# Patient Record
Sex: Male | Born: 1950
Health system: Southern US, Community
[De-identification: ages and names within clinical notes are randomized; demographics above are authoritative.]

## PROBLEM LIST (undated history)

## (undated) DIAGNOSIS — C61 Malignant neoplasm of prostate: Secondary | ICD-10-CM

## (undated) DIAGNOSIS — E119 Type 2 diabetes mellitus without complications: Secondary | ICD-10-CM

## (undated) DIAGNOSIS — E785 Hyperlipidemia, unspecified: Secondary | ICD-10-CM

## (undated) DIAGNOSIS — N200 Calculus of kidney: Secondary | ICD-10-CM

## (undated) DIAGNOSIS — I10 Essential (primary) hypertension: Secondary | ICD-10-CM

## (undated) DIAGNOSIS — I350 Nonrheumatic aortic (valve) stenosis: Secondary | ICD-10-CM

## (undated) DIAGNOSIS — I509 Heart failure, unspecified: Secondary | ICD-10-CM

## (undated) DIAGNOSIS — R7303 Prediabetes: Secondary | ICD-10-CM

## (undated) DIAGNOSIS — Z87442 Personal history of urinary calculi: Secondary | ICD-10-CM

## (undated) DIAGNOSIS — Z9109 Other allergy status, other than to drugs and biological substances: Secondary | ICD-10-CM

## (undated) DIAGNOSIS — R569 Unspecified convulsions: Secondary | ICD-10-CM

## (undated) DIAGNOSIS — C801 Malignant (primary) neoplasm, unspecified: Secondary | ICD-10-CM

## (undated) DIAGNOSIS — I2510 Atherosclerotic heart disease of native coronary artery without angina pectoris: Secondary | ICD-10-CM

## (undated) DIAGNOSIS — J45909 Unspecified asthma, uncomplicated: Secondary | ICD-10-CM

## (undated) DIAGNOSIS — K635 Polyp of colon: Secondary | ICD-10-CM

## (undated) HISTORY — DX: Atherosclerotic heart disease of native coronary artery without angina pectoris: I25.10

## (undated) HISTORY — DX: Unspecified asthma, uncomplicated: J45.909

## (undated) HISTORY — DX: Other allergy status, other than to drugs and biological substances: Z91.09

## (undated) HISTORY — DX: Hyperlipidemia, unspecified: E78.5

## (undated) HISTORY — DX: Nonrheumatic aortic (valve) stenosis: I35.0

## (undated) HISTORY — PX: OTHER SURGICAL HISTORY: SHX169

## (undated) HISTORY — DX: Malignant (primary) neoplasm, unspecified: C80.1

## (undated) HISTORY — PX: EXTRACORPOREAL SHOCK WAVE LITHOTRIPSY: SHX1557

## (undated) HISTORY — DX: Polyp of colon: K63.5

## (undated) HISTORY — DX: Calculus of kidney: N20.0

---

## 1998-01-05 ENCOUNTER — Emergency Department (HOSPITAL_COMMUNITY): Admission: EM | Admit: 1998-01-05 | Discharge: 1998-01-05 | Payer: Self-pay | Admitting: Emergency Medicine

## 1998-01-16 ENCOUNTER — Ambulatory Visit (HOSPITAL_COMMUNITY): Admission: AD | Admit: 1998-01-16 | Discharge: 1998-01-16 | Payer: Self-pay | Admitting: *Deleted

## 1998-01-16 ENCOUNTER — Encounter: Payer: Self-pay | Admitting: *Deleted

## 1998-01-21 ENCOUNTER — Observation Stay (HOSPITAL_COMMUNITY): Admission: EM | Admit: 1998-01-21 | Discharge: 1998-01-22 | Payer: Self-pay

## 1998-09-01 ENCOUNTER — Encounter: Payer: Self-pay | Admitting: Emergency Medicine

## 1998-09-01 ENCOUNTER — Emergency Department (HOSPITAL_COMMUNITY): Admission: EM | Admit: 1998-09-01 | Discharge: 1998-09-01 | Payer: Self-pay | Admitting: Emergency Medicine

## 2002-05-17 HISTORY — PX: OTHER SURGICAL HISTORY: SHX169

## 2002-10-15 ENCOUNTER — Encounter: Payer: Self-pay | Admitting: Emergency Medicine

## 2002-10-15 ENCOUNTER — Inpatient Hospital Stay (HOSPITAL_COMMUNITY): Admission: EM | Admit: 2002-10-15 | Discharge: 2002-10-19 | Payer: Self-pay | Admitting: Emergency Medicine

## 2003-12-25 ENCOUNTER — Emergency Department (HOSPITAL_COMMUNITY): Admission: EM | Admit: 2003-12-25 | Discharge: 2003-12-25 | Payer: Self-pay | Admitting: Emergency Medicine

## 2005-09-15 ENCOUNTER — Encounter: Payer: Self-pay | Admitting: Emergency Medicine

## 2011-03-10 ENCOUNTER — Ambulatory Visit: Payer: Self-pay | Admitting: Radiation Oncology

## 2013-05-17 HISTORY — PX: EYE SURGERY: SHX253

## 2014-01-17 ENCOUNTER — Encounter: Payer: Self-pay | Admitting: Cardiovascular Disease

## 2014-01-29 ENCOUNTER — Encounter: Payer: Self-pay | Admitting: General Surgery

## 2014-01-29 ENCOUNTER — Ambulatory Visit (INDEPENDENT_AMBULATORY_CARE_PROVIDER_SITE_OTHER): Payer: BC Managed Care – PPO | Admitting: Cardiology

## 2014-01-29 ENCOUNTER — Encounter: Payer: Self-pay | Admitting: Cardiology

## 2014-01-29 VITALS — BP 132/90 | HR 81 | Ht 67.0 in | Wt 156.6 lb

## 2014-01-29 DIAGNOSIS — R0789 Other chest pain: Secondary | ICD-10-CM

## 2014-01-29 DIAGNOSIS — R011 Cardiac murmur, unspecified: Secondary | ICD-10-CM | POA: Insufficient documentation

## 2014-01-29 DIAGNOSIS — E785 Hyperlipidemia, unspecified: Secondary | ICD-10-CM

## 2014-01-29 DIAGNOSIS — R0602 Shortness of breath: Secondary | ICD-10-CM

## 2014-01-29 NOTE — Consult Note (Signed)
[place] [place], [place]  [address] Phone: [phone] Fax:  [phone]  Date:  01/29/2014   ID:  FAARIS ARIZPE, DOB [birth year], MRN [id number]  PCP:  Shirline Frees, MD  Cardiologist:  Fransico Him, MD     History of Present Illness: Melvin Dunn is a 63 y.o. male with a history of dyslipidemia, adenoCA of the prostate, asthma who presents today for evaluation of chest pressure and tightness and SOB.  These symptoms are different from what he gets with his asthma.  These symptoms have been going on for about 8 months but recently have increased in frequency and with less exertion.  He has not rest symptoms.  He describes it as SOB and then his chest feels tight.  There is no radiation of the discomfort.  He denies any diaphoresis or nausea with it.  He has also noticed exertional fatigue.  He denies any LE edema, palpitations or syncope.   Wt Readings from Last 3 Encounters:  01/29/14 156 lb 9.6 oz (71.033 kg)     Past Medical History  Diagnosis Date  . Environmental allergies   . Asthma   . Hyperlipidemia   . Colon polyp   . Adenocarcinoma     of the prostate, 5% of single core 01/2011-watching and waiting for now  . Kidney stone   . Prediabetes     Current Outpatient Prescriptions  Medication Sig Dispense Refill  . albuterol (PROVENTIL HFA;VENTOLIN HFA) 108 (90 BASE) MCG/ACT inhaler Inhale 1-2 puffs into the lungs every 4 (four) hours as needed for wheezing or shortness of breath.      Marland Kitchen atorvastatin (LIPITOR) 40 MG tablet 40 mg daily.      . fluticasone (FLONASE) 50 MCG/ACT nasal spray       . mometasone-formoterol (DULERA) 100-5 MCG/ACT AERO Inhale 2 puffs into the lungs 2 (two) times daily.      Marland Kitchen NITROSTAT 0.4 MG SL tablet        No current facility-administered medications for this visit.    Allergies:   No Known Allergies  Social History:  The patient  reports that he has never smoked. He does not have any smokeless tobacco history on file. He  reports that he does not drink alcohol or use illicit drugs.   Family History:  The patient's family history includes Alzheimer's disease in his mother; Cancer - Lung in his father.   ROS:  Please see the history of present illness.      All other systems reviewed and negative.   PHYSICAL EXAM: VS:  BP 132/90  Pulse 81  Ht 5\' 7"  (1.702 m)  Wt 156 lb 9.6 oz (71.033 kg)  BMI 24.52 kg/m2 Well nourished, well developed, in no acute distress HEENT: normal Neck: no JVD Cardiac:  normal S1, S2; RRR; 2/6 SM at LLSB to apex Lungs:  clear to auscultation bilaterally, no wheezing, rhonchi or rales Abd: soft, nontender, no hepatomegaly Ext: no edema Skin: warm and dry Neuro:  CNs 2-12 intact, no focal abnormalities noted  EKG:   NSR with LVH, no ST changes  ASSESSMENT AND PLAN:  1. Chest discomfort with very few CRF.   - Stress myoview to rule out ishemia 2.  DOE which may be secondary to asthma but need to rule out CAD.  With heart mumur, this could also be due to valvular heart disease 3.  Heart murmur - check 2D echo to assess  Followup PRN pending results of  studies   Signed, Fransico Him, MD 01/29/2014 4:13 PM

## 2014-01-29 NOTE — Patient Instructions (Signed)
Your physician recommends that you continue on your current medications as directed. Please refer to the Current Medication list given to you today.  Your physician has requested that you have an echocardiogram. Echocardiography is a painless test that uses sound waves to create images of your heart. It provides your doctor with information about the size and shape of your heart and how well your heart's chambers and valves are working. This procedure takes approximately one hour. There are no restrictions for this procedure.  Your physician has requested that you have an exercise stress myoview. For further information please visit [email or web address]. Please follow instruction sheet, as given.   Your physician recommends that you schedule a follow-up appointment Pending Results of studies

## 2014-01-30 NOTE — Progress Notes (Addendum)
[place], [address] [place], [place]  [address] Phone: [phone] Fax:  [phone]  Date:  01/29/2014   ID:  Melvin Dunn, DOB 06-23-1950, MRN [id number]  PCP:  Shirline Frees, MD  Cardiologist:  Fransico Him, MD    History of Present Illness: Melvin Dunn is a 63 y.o. male with a history of dyslipidemia, prediabetes and asthma who presents today for evaluation of SOB and chest pain.  He recently saw Dr. [name] and complained pressure in his chest when he would exert himself.  He has also noticed SOB with exertion that is different from his asthma.  He denies any nausea but occasionally will have some diaphoresis with the pain.  EKG shows NSR at baseline.     Wt Readings from Last 3 Encounters:  02/11/14 150 lb (68.04 kg)  01/29/14 156 lb 9.6 oz (71.033 kg)     Past Medical History  Diagnosis Date  . Environmental allergies   . Asthma   . Hyperlipidemia   . Colon polyp   . Adenocarcinoma     of the prostate, 5% of single core 01/2011-watching and waiting for now  . Kidney stone   . Prediabetes     Current Outpatient Prescriptions  Medication Sig Dispense Refill  . atorvastatin (LIPITOR) 40 MG tablet Take 40 mg by mouth at bedtime.       . fluticasone (FLONASE) 50 MCG/ACT nasal spray Place 2 sprays into both nostrils at bedtime.       Marland Kitchen albuterol (PROAIR HFA) 108 (90 BASE) MCG/ACT inhaler Inhale 1-2 puffs into the lungs daily as needed for wheezing or shortness of breath.      Marland Kitchen aspirin EC 325 MG tablet Take 1 tablet (325 mg total) by mouth daily.  1 tablet  0  . mometasone-formoterol (DULERA) 200-5 MCG/ACT AERO Inhale 2 puffs into the lungs at bedtime.      . Multiple Vitamin (MULTIVITAMIN WITH MINERALS) TABS tablet Take 1 tablet by mouth daily.      . nitroGLYCERIN (NITROSTAT) 0.4 MG SL tablet Place 0.4 mg under the tongue every 5 (five) minutes as needed for chest pain.       No current facility-administered medications for this visit.    Allergies:   No Known  Allergies  Social History:  The patient  reports that he has never smoked. He does not have any smokeless tobacco history on file. He reports that he does not drink alcohol or use illicit drugs.   Family History:  The patient's family history includes Alzheimer's disease in his mother; Cancer - Lung in his father.   ROS:  Please see the history of present illness.      All other systems reviewed and negative.   PHYSICAL EXAM: VS:  BP 132/90  Pulse 81  Ht 5\' 7"  (1.702 m)  Wt 156 lb 9.6 oz (71.033 kg)  BMI 24.52 kg/m2 Well nourished, well developed, in no acute distress HEENT: normal Neck: no JVD Cardiac:  normal S1, S2; RRR; no murmur Lungs:  clear to auscultation bilaterally, no wheezing, rhonchi or rales Abd: soft, nontender, no hepatomegaly Ext: no edema Skin: warm and dry Neuro:  CNs 2-12 intact, no focal abnormalities noted  EKG:     NSR with no ST/T wave abnormality  ASSESSMENT AND PLAN:  1. Chest pain with exertion associated with DOE worrisome for exertional angina.  His CRF include dyslipidemia and prediabetes. - will get a Stress myoview to rule out ischemia -  check 2D echo to evaluate LVF 2. Dyslipidemia 3. Prediabetes  Followup pending results of studies  Signed, Fransico Him, MD [facility] 01/29/2014

## 2014-02-11 ENCOUNTER — Other Ambulatory Visit: Payer: Self-pay | Admitting: Cardiology

## 2014-02-11 ENCOUNTER — Other Ambulatory Visit: Payer: Self-pay

## 2014-02-11 ENCOUNTER — Ambulatory Visit (HOSPITAL_COMMUNITY): Payer: BC Managed Care – PPO | Attending: Internal Medicine | Admitting: Radiology

## 2014-02-11 ENCOUNTER — Encounter (HOSPITAL_COMMUNITY): Payer: Self-pay | Admitting: Pharmacy Technician

## 2014-02-11 ENCOUNTER — Other Ambulatory Visit (INDEPENDENT_AMBULATORY_CARE_PROVIDER_SITE_OTHER): Payer: BC Managed Care – PPO | Admitting: *Deleted

## 2014-02-11 ENCOUNTER — Ambulatory Visit (HOSPITAL_BASED_OUTPATIENT_CLINIC_OR_DEPARTMENT_OTHER): Payer: BC Managed Care – PPO | Admitting: Radiology

## 2014-02-11 VITALS — BP 135/71 | Ht 67.0 in | Wt 150.0 lb

## 2014-02-11 DIAGNOSIS — E785 Hyperlipidemia, unspecified: Secondary | ICD-10-CM | POA: Insufficient documentation

## 2014-02-11 DIAGNOSIS — R011 Cardiac murmur, unspecified: Secondary | ICD-10-CM | POA: Diagnosis present

## 2014-02-11 DIAGNOSIS — J45909 Unspecified asthma, uncomplicated: Secondary | ICD-10-CM | POA: Insufficient documentation

## 2014-02-11 DIAGNOSIS — R9430 Abnormal result of cardiovascular function study, unspecified: Secondary | ICD-10-CM

## 2014-02-11 DIAGNOSIS — R0602 Shortness of breath: Secondary | ICD-10-CM | POA: Insufficient documentation

## 2014-02-11 DIAGNOSIS — I350 Nonrheumatic aortic (valve) stenosis: Secondary | ICD-10-CM

## 2014-02-11 DIAGNOSIS — R079 Chest pain, unspecified: Secondary | ICD-10-CM | POA: Insufficient documentation

## 2014-02-11 DIAGNOSIS — R0789 Other chest pain: Secondary | ICD-10-CM

## 2014-02-11 DIAGNOSIS — R9439 Abnormal result of other cardiovascular function study: Secondary | ICD-10-CM | POA: Diagnosis not present

## 2014-02-11 HISTORY — DX: Nonrheumatic aortic (valve) stenosis: I35.0

## 2014-02-11 LAB — CBC
HCT: 43.6 % (ref 39.0–52.0)
HEMOGLOBIN: 14.6 g/dL (ref 13.0–17.0)
MCHC: 33.5 g/dL (ref 30.0–36.0)
MCV: 86.1 fl (ref 78.0–100.0)
PLATELETS: 266 10*3/uL (ref 150.0–400.0)
RBC: 5.06 Mil/uL (ref 4.22–5.81)
RDW: 13.9 % (ref 11.5–15.5)
WBC: 10.4 10*3/uL (ref 4.0–10.5)

## 2014-02-11 LAB — BASIC METABOLIC PANEL
BUN: 13 mg/dL (ref 6–23)
CO2: 30 meq/L (ref 19–32)
Calcium: 9.2 mg/dL (ref 8.4–10.5)
Chloride: 100 mEq/L (ref 96–112)
Creatinine, Ser: 1 mg/dL (ref 0.4–1.5)
GFR: 79.11 mL/min (ref >60.00)
GLUCOSE: 107 mg/dL — AB (ref 70–99)
Potassium: 3.8 mEq/L (ref 3.5–5.1)
SODIUM: 135 meq/L (ref 135–145)

## 2014-02-11 LAB — PROTIME-INR
INR: 1.1 ratio — ABNORMAL HIGH (ref 0.8–1.0)
Prothrombin Time: 12.2 s (ref 9.6–13.1)

## 2014-02-11 MED ORDER — TECHNETIUM TC 99M SESTAMIBI GENERIC - CARDIOLITE
10.0000 | Freq: Once | INTRAVENOUS | Status: AC | PRN
  Administered 2014-02-11: 10 via INTRAVENOUS

## 2014-02-11 MED ORDER — TECHNETIUM TC 99M SESTAMIBI GENERIC - CARDIOLITE
30.0000 | Freq: Once | INTRAVENOUS | Status: AC | PRN
  Administered 2014-02-11: 30 via INTRAVENOUS

## 2014-02-11 MED ORDER — ASPIRIN EC 325 MG PO TBEC: 325.0000 mg | DELAYED_RELEASE_TABLET | Freq: Every day | ORAL | Status: DC

## 2014-02-11 NOTE — Addendum Note (Signed)
Addended by: Sueanne Margarita on: 02/11/2014 09:57 PM   Modules accepted: Level of Service

## 2014-02-11 NOTE — Progress Notes (Signed)
[facility]UCLE[facility] [place], [place] [address] [phone]    Cardiology Nuclear Med Study  Melvin Dunn is a 63 y.o. male     MRN : [id number]     DOB: 08/09/1950  Procedure Date: 02/11/2014  Nuclear Med Background Indication for Stress Test:  Evaluation for Ischemia History:  ASTHMA Cardiac Risk Factors: Lipids  Symptoms:  Lipids   Nuclear Pre-Procedure Caffeine/Decaff Intake:  None> 12 hrs NPO After: 7:00pm   Lungs:  clear O2 Sat: 96/97% on room air. IV 0.9% NS with Angio Cath:  22g  IV Site: R Antecubital x 1, tolerated well IV Started by:  Irven Baltimore, RN  Chest Size (in):  42 Cup Size: n/a  Height: 5\' 7"  (1.702 m)  Weight:  150 lb (68.04 kg)  BMI:  Body mass index is 23.49 kg/(m^2). Tech Comments:  This patient had chest pain, sob,he became very pale/gray, had blunted BP's and his BP dropped as soon as he went into recovery. S.Williams EMTP    Nuclear Med Study 1 or 2 day study: 1 day  Stress Test Type:  Stress  Reading MD: N/A  Order Authorizing Provider:  Fransico Him, MD  Resting Radionuclide: Technetium 34m Sestamibi  Resting Radionuclide Dose: 11.0 mCi   Stress Radionuclide:  Technetium 40m Sestamibi  Stress Radionuclide Dose: 33.0 mCi           Stress Protocol Rest HR: 65 Stress HR: 137  Rest BP: 135/71 Stress BP: 166/78  Exercise Time (min): 6:50 METS: 7.60   Predicted Max HR: 157 bpm % Max HR: 87.26 bpm Rate Pressure Product: 22742   Dose of Adenosine (mg):  n/a Dose of Lexiscan: n/a mg  Dose of Atropine (mg): n/a Dose of Dobutamine: n/a mcg/kg/min (at max HR)  Stress Test Technologist: Perrin Maltese, EMT-P  Nuclear Technologist:  Earl Many, CNMT     Rest Procedure:  Myocardial perfusion imaging was performed at rest 45 minutes following the intravenous administration of Technetium 59m Sestamibi. Rest ECG: NSR - Normal EKG  Stress Procedure:  The patient exercised on the treadmill utilizing the Bruce  Protocol for 6:50 minutes. The patient stopped due to sob, fatigue, he was very pale, and chest pain.  Technetium 38m Sestamibi was injected at peak exercise and myocardial perfusion imaging was performed after a brief delay. Stress ECG: Significant ST abnormalities consistent with ischemia.  QPS Raw Data Images:  There is interference from nuclear activity from structures below the diaphragm. This does not affect the ability to read the study. Stress Images:  There is decreased uptake in the inferior wall. Rest Images:  Normal homogeneous uptake in all areas of the myocardium. Subtraction (SDS):  These findings are consistent with ischemia. Transient Ischemic Dilatation (Normal <1.22):  1.24 Lung/Heart Ratio (Normal <0.45):  0.35  Quantitative Gated Spect Images QGS EDV:  94 ml QGS ESV:  46 ml  Impression Exercise Capacity:  Good exercise capacity. BP Response:  Normal blood pressure response. Clinical Symptoms:  Significant chest pain. ECG Impression:  Significant ST abnormalities consistent with ischemia. Comparison with Prior Nuclear Study: No previous nuclear study performed  Overall Impression:  High risk stress nuclear study.   With stress there is a medium size moderate intensity reversible inferoapical defect. There are marked ST segment depression up to 5 mm in inferolateral leads with stress.  LV Ejection Fraction: 51%.  LV Wall Motion:  NL LV Function; NL Wall Motion  Darlin Coco MD

## 2014-02-11 NOTE — Progress Notes (Signed)
Echocardiogram performed.  

## 2014-02-11 NOTE — H&P (Signed)
[place], [address]  [place], [place] [address]  Phone: [phone]  Fax: [phone]   Date: 01/29/2014   ID: Melvin Dunn, DOB 1950/11/06, MRN [id number]   PCP: Shirline Frees, MD  Cardiologist: Fransico Him, MD   History of Present Illness:  Melvin Dunn is a 63 y.o. male with a history of dyslipidemia, prediabetes and asthma who presents today for evaluation of SOB and chest pain. He recently saw Dr. [name] and complained pressure in his chest when he would exert himself. He has also noticed SOB with exertion that is different from his asthma. He denies any nausea but occasionally will have some diaphoresis with the pain. EKG shows NSR at baseline.  Wt Readings from Last 3 Encounters:   02/11/14  150 lb (68.04 kg)   01/29/14  156 lb 9.6 oz (71.033 kg)    Past Medical History   Diagnosis  Date   .  Environmental allergies    .  Asthma    .  Hyperlipidemia    .  Colon polyp    .  Adenocarcinoma      of the prostate, 5% of single core 01/2011-watching and waiting for now   .  Kidney stone    .  Prediabetes     Current Outpatient Prescriptions   Medication  Sig  Dispense  Refill   .  atorvastatin (LIPITOR) 40 MG tablet  Take 40 mg by mouth at bedtime.     .  fluticasone (FLONASE) 50 MCG/ACT nasal spray  Place 2 sprays into both nostrils at bedtime.     Marland Kitchen  albuterol (PROAIR HFA) 108 (90 BASE) MCG/ACT inhaler  Inhale 1-2 puffs into the lungs daily as needed for wheezing or shortness of breath.     Marland Kitchen  aspirin EC 325 MG tablet  Take 1 tablet (325 mg total) by mouth daily.  1 tablet  0   .  mometasone-formoterol (DULERA) 200-5 MCG/ACT AERO  Inhale 2 puffs into the lungs at bedtime.     .  Multiple Vitamin (MULTIVITAMIN WITH MINERALS) TABS tablet  Take 1 tablet by mouth daily.     .  nitroGLYCERIN (NITROSTAT) 0.4 MG SL tablet  Place 0.4 mg under the tongue every 5 (five) minutes as needed for chest pain.      No current facility-administered medications for this visit.     Allergies: No Known Allergies  Social History: The patient reports that he has never smoked. He does not have any smokeless tobacco history on file. He reports that he does not drink alcohol or use illicit drugs.  Family History: The patient's family history includes Alzheimer's disease in his mother; Cancer - Lung in his father.  ROS: Please see the history of present illness. All other systems reviewed and negative.  PHYSICAL EXAM:  VS: BP 132/90  Pulse 81  Ht 5\' 7"  (1.702 m)  Wt 156 lb 9.6 oz (71.033 kg)  BMI 24.52 kg/m2  Well nourished, well developed, in no acute distress  HEENT: normal  Neck: no JVD  Cardiac: normal S1, S2; RRR; no murmur  Lungs: clear to auscultation bilaterally, no wheezing, rhonchi or rales  Abd: soft, nontender, no hepatomegaly  Ext: no edema  Skin: warm and dry  Neuro: CNs 2-12 intact, no focal abnormalities noted  EKG: NSR with no ST/T wave abnormality  ASSESSMENT AND PLAN:  1. Chest pain with exertion associated with DOE worrisome for exertional angina. His CRF include  dyslipidemia and prediabetes. - will get a Stress myoview to rule out ischemia  - check 2D echo to evaluate LVF  2. Dyslipidemia 3. Prediabetes  Followup pending results of studies   Signed,  Fransico Him, MD  [facility]  01/29/2014

## 2014-02-11 NOTE — H&P (Signed)
[place], [address]  [place], [place] [address]  Phone: [phone]  Fax: [phone]  Date: 01/29/2014  ID: Melvin Dunn, DOB 1951-04-09, MRN [id number]  PCP: Shirline Frees, MD  Cardiologist: Fransico Him, MD  History of Present Illness:  Melvin Dunn is a 63 y.o. male with a history of dyslipidemia, prediabetes and asthma who presents today for evaluation of SOB and chest pain. He recently saw Dr. [name] and complained pressure in his chest when he would exert himself. He has also noticed SOB with exertion that is different from his asthma. He denies any nausea but occasionally will have some diaphoresis with the pain. EKG shows NSR at baseline.  Wt Readings from Last 3 Encounters:   02/11/14  150 lb (68.04 kg)   01/29/14  156 lb 9.6 oz (71.033 kg)    Past Medical History   Diagnosis  Date   .  Environmental allergies    .  Asthma    .  Hyperlipidemia    .  Colon polyp    .  Adenocarcinoma      of the prostate, 5% of single core 01/2011-watching and waiting for now   .  Kidney stone    .  Prediabetes     Current Outpatient Prescriptions   Medication  Sig  Dispense  Refill   .  atorvastatin (LIPITOR) 40 MG tablet  Take 40 mg by mouth at bedtime.     .  fluticasone (FLONASE) 50 MCG/ACT nasal spray  Place 2 sprays into both nostrils at bedtime.     Marland Kitchen  albuterol (PROAIR HFA) 108 (90 BASE) MCG/ACT inhaler  Inhale 1-2 puffs into the lungs daily as needed for wheezing or shortness of breath.     Marland Kitchen  aspirin EC 325 MG tablet  Take 1 tablet (325 mg total) by mouth daily.  1 tablet  0   .  mometasone-formoterol (DULERA) 200-5 MCG/ACT AERO  Inhale 2 puffs into the lungs at bedtime.     .  Multiple Vitamin (MULTIVITAMIN WITH MINERALS) TABS tablet  Take 1 tablet by mouth daily.     .  nitroGLYCERIN (NITROSTAT) 0.4 MG SL tablet  Place 0.4 mg under the tongue every 5 (five) minutes as needed for chest pain.      No current facility-administered medications for this visit.    Allergies:  No Known Allergies  Social History: The patient reports that he has never smoked. He does not have any smokeless tobacco history on file. He reports that he does not drink alcohol or use illicit drugs.  Family History: The patient's family history includes Alzheimer's disease in his mother; Cancer - Lung in his father.  ROS: Please see the history of present illness. All other systems reviewed and negative.  PHYSICAL EXAM:  VS: BP 132/90  Pulse 81  Ht 5\' 7"  (1.702 m)  Wt 156 lb 9.6 oz (71.033 kg)  BMI 24.52 kg/m2  Well nourished, well developed, in no acute distress  HEENT: normal  Neck: no JVD  Cardiac: normal S1, S2; RRR; no murmur  Lungs: clear to auscultation bilaterally, no wheezing, rhonchi or rales  Abd: soft, nontender, no hepatomegaly  Ext: no edema  Skin: warm and dry  Neuro: CNs 2-12 intact, no focal abnormalities noted  EKG: NSR with no ST/T wave abnormality  ASSESSMENT AND PLAN:  1. Chest pain with exertion associated with DOE worrisome for exertional angina. His CRF include dyslipidemia and prediabetes. - will get  a Stress myoview to rule out ischemia  - check 2D echo to evaluate LVF  2. Dyslipidemia 3. Prediabetes Followup pending results of studies  Signed,  Fransico Him, MD  [facility]  01/29/2014

## 2014-02-12 ENCOUNTER — Encounter (HOSPITAL_COMMUNITY): Payer: Self-pay | Admitting: *Deleted

## 2014-02-12 ENCOUNTER — Encounter (HOSPITAL_COMMUNITY)
Admission: RE | Disposition: A | Discharge status: Home / Self Care | Payer: Self-pay | Source: Ambulatory Visit | Attending: Thoracic Surgery (Cardiothoracic Vascular Surgery)

## 2014-02-12 ENCOUNTER — Ambulatory Visit (HOSPITAL_COMMUNITY): Payer: BC Managed Care – PPO

## 2014-02-12 ENCOUNTER — Other Ambulatory Visit: Payer: Self-pay

## 2014-02-12 ENCOUNTER — Inpatient Hospital Stay (HOSPITAL_COMMUNITY)
Admission: RE | Admit: 2014-02-12 | Discharge: 2014-02-18 | DRG: 234 | Disposition: A | Discharge status: Home / Self Care | Payer: BC Managed Care – PPO | Source: Ambulatory Visit | Attending: Thoracic Surgery (Cardiothoracic Vascular Surgery) | Admitting: Thoracic Surgery (Cardiothoracic Vascular Surgery)

## 2014-02-12 DIAGNOSIS — I25118 Atherosclerotic heart disease of native coronary artery with other forms of angina pectoris: Secondary | ICD-10-CM | POA: Diagnosis not present

## 2014-02-12 DIAGNOSIS — Z01810 Encounter for preprocedural cardiovascular examination: Secondary | ICD-10-CM

## 2014-02-12 DIAGNOSIS — I359 Nonrheumatic aortic valve disorder, unspecified: Secondary | ICD-10-CM

## 2014-02-12 DIAGNOSIS — Z951 Presence of aortocoronary bypass graft: Secondary | ICD-10-CM

## 2014-02-12 DIAGNOSIS — Z801 Family history of malignant neoplasm of trachea, bronchus and lung: Secondary | ICD-10-CM | POA: Diagnosis not present

## 2014-02-12 DIAGNOSIS — E119 Type 2 diabetes mellitus without complications: Secondary | ICD-10-CM

## 2014-02-12 DIAGNOSIS — I25119 Atherosclerotic heart disease of native coronary artery with unspecified angina pectoris: Secondary | ICD-10-CM | POA: Diagnosis present

## 2014-02-12 DIAGNOSIS — I209 Angina pectoris, unspecified: Secondary | ICD-10-CM | POA: Diagnosis present

## 2014-02-12 DIAGNOSIS — Z7982 Long term (current) use of aspirin: Secondary | ICD-10-CM

## 2014-02-12 DIAGNOSIS — I2510 Atherosclerotic heart disease of native coronary artery without angina pectoris: Secondary | ICD-10-CM

## 2014-02-12 DIAGNOSIS — R6510 Systemic inflammatory response syndrome (SIRS) of non-infectious origin without acute organ dysfunction: Secondary | ICD-10-CM | POA: Diagnosis not present

## 2014-02-12 DIAGNOSIS — D62 Acute posthemorrhagic anemia: Secondary | ICD-10-CM | POA: Diagnosis not present

## 2014-02-12 DIAGNOSIS — J45909 Unspecified asthma, uncomplicated: Secondary | ICD-10-CM | POA: Diagnosis present

## 2014-02-12 DIAGNOSIS — E8770 Fluid overload, unspecified: Secondary | ICD-10-CM | POA: Diagnosis not present

## 2014-02-12 DIAGNOSIS — I350 Nonrheumatic aortic (valve) stenosis: Secondary | ICD-10-CM | POA: Diagnosis present

## 2014-02-12 DIAGNOSIS — R079 Chest pain, unspecified: Secondary | ICD-10-CM | POA: Diagnosis present

## 2014-02-12 DIAGNOSIS — E785 Hyperlipidemia, unspecified: Secondary | ICD-10-CM | POA: Diagnosis present

## 2014-02-12 DIAGNOSIS — Z8546 Personal history of malignant neoplasm of prostate: Secondary | ICD-10-CM

## 2014-02-12 HISTORY — DX: Nonrheumatic aortic (valve) stenosis: I35.0

## 2014-02-12 HISTORY — DX: Type 2 diabetes mellitus without complications: E11.9

## 2014-02-12 HISTORY — PX: LEFT HEART CATHETERIZATION WITH CORONARY ANGIOGRAM: SHX5451

## 2014-02-12 LAB — COMPREHENSIVE METABOLIC PANEL
ALT: 31 U/L (ref 0–53)
AST: 25 U/L (ref 0–37)
Albumin: 3.8 g/dL (ref 3.5–5.2)
Alkaline Phosphatase: 70 U/L (ref 39–117)
Anion gap: 14 (ref 5–15)
BILIRUBIN TOTAL: 0.6 mg/dL (ref 0.3–1.2)
BUN: 16 mg/dL (ref 6–23)
CHLORIDE: 100 meq/L (ref 96–112)
CO2: 26 meq/L (ref 19–32)
Calcium: 9.3 mg/dL (ref 8.4–10.5)
Creatinine, Ser: 0.88 mg/dL (ref 0.50–1.35)
GFR calc non Af Amer: 90 mL/min — ABNORMAL LOW (ref >90)
Glucose, Bld: 111 mg/dL — ABNORMAL HIGH (ref 70–99)
POTASSIUM: 3.8 meq/L (ref 3.7–5.3)
Sodium: 140 mEq/L (ref 137–147)
Total Protein: 7.3 g/dL (ref 6.0–8.3)

## 2014-02-12 LAB — APTT: aPTT: 32 seconds (ref 24–37)

## 2014-02-12 LAB — BLOOD GAS, ARTERIAL
Acid-base deficit: 0.7 mmol/L (ref 0.0–2.0)
Bicarbonate: 23.2 mEq/L (ref 20.0–24.0)
DRAWN BY: 23588
O2 Saturation: 95.4 %
PCO2 ART: 36.5 mmHg (ref 35.0–45.0)
PH ART: 7.419 (ref 7.350–7.450)
PO2 ART: 79.2 mmHg — AB (ref 80.0–100.0)
Patient temperature: 98.6
TCO2: 24.3 mmol/L (ref 0–100)

## 2014-02-12 LAB — INFLUENZA PANEL BY PCR (TYPE A & B)
H1N1 flu by pcr: NOT DETECTED
INFLAPCR: NEGATIVE
INFLBPCR: NEGATIVE

## 2014-02-12 LAB — PROTIME-INR
INR: 1.03 (ref 0.00–1.49)
Prothrombin Time: 13.5 seconds (ref 11.6–15.2)

## 2014-02-12 LAB — ABO/RH: ABO/RH(D): O POS

## 2014-02-12 LAB — MRSA PCR SCREENING: MRSA BY PCR: NEGATIVE

## 2014-02-12 LAB — TYPE AND SCREEN
ABO/RH(D): O POS
Antibody Screen: NEGATIVE

## 2014-02-12 SURGERY — LEFT HEART CATHETERIZATION WITH CORONARY ANGIOGRAM
Anesthesia: LOCAL

## 2014-02-12 MED ORDER — HEPARIN (PORCINE) IN NACL 100-0.45 UNIT/ML-% IJ SOLN
800.0000 [IU]/h | INTRAMUSCULAR | Status: DC
  Administered 2014-02-12: 800 [IU]/h via INTRAVENOUS
  Filled 2014-02-12: qty 250

## 2014-02-12 MED ORDER — MIDAZOLAM HCL 2 MG/2ML IJ SOLN
INTRAMUSCULAR | Status: AC
  Filled 2014-02-12: qty 2

## 2014-02-12 MED ORDER — PLASMA-LYTE 148 IV SOLN
INTRAVENOUS | Status: AC
  Administered 2014-02-13: 10:00:00
  Filled 2014-02-12: qty 2.5

## 2014-02-12 MED ORDER — FLUTICASONE PROPIONATE 50 MCG/ACT NA SUSP
2.0000 | Freq: Every day | NASAL | Status: DC
  Administered 2014-02-12: 2 via NASAL
  Filled 2014-02-12: qty 16

## 2014-02-12 MED ORDER — SODIUM CHLORIDE 0.9 % IV SOLN
INTRAVENOUS | Status: AC
  Administered 2014-02-13: 1 [IU]/h via INTRAVENOUS
  Filled 2014-02-12: qty 2.5

## 2014-02-12 MED ORDER — SODIUM CHLORIDE 0.9 % IV SOLN: 250.0000 mL | INTRAVENOUS | Status: DC | PRN

## 2014-02-12 MED ORDER — ACETAMINOPHEN 325 MG PO TABS: 650.0000 mg | ORAL_TABLET | ORAL | Status: DC | PRN

## 2014-02-12 MED ORDER — HEPARIN SODIUM (PORCINE) 1000 UNIT/ML IJ SOLN
INTRAMUSCULAR | Status: AC
  Filled 2014-02-12: qty 1

## 2014-02-12 MED ORDER — DEXTROSE 5 % IV SOLN
750.0000 mg | INTRAVENOUS | Status: DC
  Filled 2014-02-12: qty 750

## 2014-02-12 MED ORDER — ASPIRIN EC 325 MG PO TBEC
325.0000 mg | DELAYED_RELEASE_TABLET | Freq: Every day | ORAL | Status: DC
  Filled 2014-02-12: qty 1

## 2014-02-12 MED ORDER — ONDANSETRON HCL 4 MG/2ML IJ SOLN: 4.0000 mg | Freq: Four times a day (QID) | INTRAMUSCULAR | Status: DC | PRN

## 2014-02-12 MED ORDER — SODIUM CHLORIDE 0.9 % IV SOLN
INTRAVENOUS | Status: DC
  Administered 2014-02-12: 15:00:00 via INTRAVENOUS

## 2014-02-12 MED ORDER — ALBUTEROL SULFATE (2.5 MG/3ML) 0.083% IN NEBU: 2.5000 mg | INHALATION_SOLUTION | Freq: Every day | RESPIRATORY_TRACT | Status: DC | PRN

## 2014-02-12 MED ORDER — SODIUM CHLORIDE 0.9 % IJ SOLN: 3.0000 mL | INTRAMUSCULAR | Status: DC | PRN

## 2014-02-12 MED ORDER — CHLORHEXIDINE GLUCONATE 4 % EX LIQD
60.0000 mL | Freq: Once | CUTANEOUS | Status: AC
  Administered 2014-02-12: 4 via TOPICAL
  Filled 2014-02-12: qty 60

## 2014-02-12 MED ORDER — SODIUM CHLORIDE 0.9 % IV SOLN
INTRAVENOUS | Status: DC
  Filled 2014-02-12: qty 30

## 2014-02-12 MED ORDER — BISACODYL 5 MG PO TBEC: 5.0000 mg | DELAYED_RELEASE_TABLET | Freq: Once | ORAL | Status: DC

## 2014-02-12 MED ORDER — METOPROLOL TARTRATE 12.5 MG HALF TABLET
12.5000 mg | ORAL_TABLET | Freq: Two times a day (BID) | ORAL | Status: DC
  Administered 2014-02-12: 12.5 mg via ORAL
  Filled 2014-02-12 (×3): qty 1

## 2014-02-12 MED ORDER — HEPARIN (PORCINE) IN NACL 2-0.9 UNIT/ML-% IJ SOLN
INTRAMUSCULAR | Status: AC
  Filled 2014-02-12: qty 1000

## 2014-02-12 MED ORDER — NITROGLYCERIN 1 MG/10 ML FOR IR/CATH LAB
INTRA_ARTERIAL | Status: AC
  Filled 2014-02-12: qty 10

## 2014-02-12 MED ORDER — OXYCODONE-ACETAMINOPHEN 5-325 MG PO TABS
1.0000 | ORAL_TABLET | ORAL | Status: DC | PRN
  Filled 2014-02-12 (×2): qty 2

## 2014-02-12 MED ORDER — TEMAZEPAM 15 MG PO CAPS
15.0000 mg | ORAL_CAPSULE | Freq: Once | ORAL | Status: AC | PRN
  Administered 2014-02-12: 15 mg via ORAL
  Filled 2014-02-12: qty 1

## 2014-02-12 MED ORDER — NITROGLYCERIN IN D5W 200-5 MCG/ML-% IV SOLN
2.0000 ug/min | INTRAVENOUS | Status: AC
  Administered 2014-02-13: 5 ug/min via INTRAVENOUS
  Filled 2014-02-12: qty 250

## 2014-02-12 MED ORDER — POTASSIUM CHLORIDE 2 MEQ/ML IV SOLN
80.0000 meq | INTRAVENOUS | Status: DC
  Filled 2014-02-12: qty 40

## 2014-02-12 MED ORDER — HEPARIN (PORCINE) IN NACL 2-0.9 UNIT/ML-% IJ SOLN
INTRAMUSCULAR | Status: AC
  Filled 2014-02-12: qty 500

## 2014-02-12 MED ORDER — PHENYLEPHRINE HCL 10 MG/ML IJ SOLN
30.0000 ug/min | INTRAVENOUS | Status: AC
  Administered 2014-02-13: 20 ug/min via INTRAVENOUS
  Filled 2014-02-12: qty 2

## 2014-02-12 MED ORDER — CHLORHEXIDINE GLUCONATE 4 % EX LIQD
60.0000 mL | Freq: Once | CUTANEOUS | Status: AC
Start: 2014-02-13 — End: 2014-02-13
  Administered 2014-02-13: 4 via TOPICAL
  Filled 2014-02-12: qty 60

## 2014-02-12 MED ORDER — FENTANYL CITRATE 0.05 MG/ML IJ SOLN
INTRAMUSCULAR | Status: AC
  Filled 2014-02-12: qty 2

## 2014-02-12 MED ORDER — VERAPAMIL HCL 2.5 MG/ML IV SOLN
INTRAVENOUS | Status: AC
  Filled 2014-02-12: qty 2

## 2014-02-12 MED ORDER — DEXTROSE 5 % IV SOLN
1.5000 g | INTRAVENOUS | Status: AC
  Administered 2014-02-13: .75 g via INTRAVENOUS
  Administered 2014-02-13: 1.5 g via INTRAVENOUS
  Filled 2014-02-12: qty 1.5

## 2014-02-12 MED ORDER — METOPROLOL TARTRATE 12.5 MG HALF TABLET
12.5000 mg | ORAL_TABLET | Freq: Once | ORAL | Status: AC
  Administered 2014-02-13: 12.5 mg via ORAL
  Filled 2014-02-12: qty 1

## 2014-02-12 MED ORDER — SODIUM CHLORIDE 0.9 % IJ SOLN: 3.0000 mL | Freq: Two times a day (BID) | INTRAMUSCULAR | Status: DC

## 2014-02-12 MED ORDER — ASPIRIN 81 MG PO CHEW
81.0000 mg | CHEWABLE_TABLET | ORAL | Status: AC
  Administered 2014-02-12: 81 mg via ORAL

## 2014-02-12 MED ORDER — LIDOCAINE HCL (PF) 1 % IJ SOLN
INTRAMUSCULAR | Status: AC
  Filled 2014-02-12: qty 30

## 2014-02-12 MED ORDER — MAGNESIUM SULFATE 50 % IJ SOLN
40.0000 meq | INTRAMUSCULAR | Status: DC
  Filled 2014-02-12 (×2): qty 10

## 2014-02-12 MED ORDER — ASPIRIN 81 MG PO CHEW
CHEWABLE_TABLET | ORAL | Status: AC
  Filled 2014-02-12: qty 1

## 2014-02-12 MED ORDER — ATORVASTATIN CALCIUM 40 MG PO TABS
40.0000 mg | ORAL_TABLET | Freq: Every day | ORAL | Status: DC
  Administered 2014-02-12: 40 mg via ORAL
  Filled 2014-02-12 (×2): qty 1

## 2014-02-12 MED ORDER — EPINEPHRINE HCL 1 MG/ML IJ SOLN
0.5000 ug/min | INTRAMUSCULAR | Status: DC
  Filled 2014-02-12: qty 4

## 2014-02-12 MED ORDER — SODIUM CHLORIDE 0.9 % IV SOLN
INTRAVENOUS | Status: AC
  Administered 2014-02-13: 69.8 mL/h via INTRAVENOUS
  Filled 2014-02-12: qty 40

## 2014-02-12 MED ORDER — DOPAMINE-DEXTROSE 3.2-5 MG/ML-% IV SOLN
2.0000 ug/kg/min | INTRAVENOUS | Status: DC
  Filled 2014-02-12: qty 250

## 2014-02-12 MED ORDER — SODIUM CHLORIDE 0.9 % IV SOLN
INTRAVENOUS | Status: AC
  Administered 2014-02-12: 16:00:00 via INTRAVENOUS

## 2014-02-12 MED ORDER — VANCOMYCIN HCL 1000 MG IV SOLR
INTRAVENOUS | Status: AC
  Administered 2014-02-13: 11:00:00
  Filled 2014-02-12: qty 1000

## 2014-02-12 MED ORDER — DEXMEDETOMIDINE HCL IN NACL 400 MCG/100ML IV SOLN
0.1000 ug/kg/h | INTRAVENOUS | Status: AC
  Administered 2014-02-13: 0.2 ug/kg/h via INTRAVENOUS
  Filled 2014-02-12: qty 100

## 2014-02-12 MED ORDER — VANCOMYCIN HCL 10 G IV SOLR
1250.0000 mg | INTRAVENOUS | Status: AC
  Administered 2014-02-13: 1250 mg via INTRAVENOUS
  Filled 2014-02-12 (×2): qty 1250

## 2014-02-12 MED ORDER — MOMETASONE FURO-FORMOTEROL FUM 200-5 MCG/ACT IN AERO
2.0000 | INHALATION_SPRAY | Freq: Every day | RESPIRATORY_TRACT | Status: DC
  Administered 2014-02-12: 2 via RESPIRATORY_TRACT
  Filled 2014-02-12: qty 8.8

## 2014-02-12 NOTE — Progress Notes (Addendum)
Pre-op Cardiac Surgery  Carotid Findings:  Bilateral:  1-39% ICA stenosis.  Vertebral artery flow is antegrade.      Upper Extremity Right Left  Brachial Pressures Not obtained due to recent radial cath 137  Radial Waveforms Tri Tri  Ulnar Waveforms Tri Tri  Palmar Arch (Allen's Test) N/A Decreases >50% with radial compression, normal with ulnar compression   Palpable pedal pulses.  Landry Mellow, RDMS, RVT 02/12/2014

## 2014-02-12 NOTE — CV Procedure (Signed)
    Cardiac Catheterization Procedure Note  Name: Melvin Dunn MRN: [id number] DOB: 1951/05/15  Procedure: Left Heart Cath, Selective Coronary Angiography, LV angiography  Indication: Progressive exertional angina, now CCS Class 3 symptoms, high-risk Myoview   Procedural Details: The right wrist was prepped, draped, and anesthetized with 1% lidocaine. Using the modified Seldinger technique, a 5/6 French Slender sheath was introduced into the right radial artery. 3 mg of verapamil was administered through the sheath, weight-based unfractionated heparin was administered intravenously. Standard Judkins catheters were used for selective coronary angiography and left ventriculography. Catheter exchanges were performed over an exchange length guidewire. There were no immediate procedural complications. A TR band was used for radial hemostasis at the completion of the procedure.  The patient was transferred to the post catheterization recovery area for further monitoring.  Procedural Findings: Hemodynamics: AO 123/65 LV 125/7  Coronary angiography: Coronary dominance: right  Left mainstem: The left main is heavily calcified. The mid to distal left main have 95% critical stenosis. The proximal left main is patent. The vessel divides into the LAD and left circumflex.  Left anterior descending (LAD): The left main lesion appears to extend into the ostium and proximal portion of the LAD with 90% associated stenosis at the ostium of the LAD. The proximal LAD at the first diagonal branch has 50% stenosis. The mid LAD is patent with minor diffuse irregularity. The diagonal branches are patent.  Left circumflex (LCx): The left circumflex is patent. There is nonobstructive disease present. The first obtuse marginal branch has 40-50% ostial stenosis. This vessel is medium in caliber. The second OM is smaller in caliber without significant disease noted. The AV circumflex beyond the second OM is very  small.  Right coronary artery (RCA): This is a dominant, heavily calcified vessel. The mid vessel has 95% stenosis. At the junction of the mid and distal vessel, there is another 95% stenosis. The PDA is patent. The PLA branch has a 95% stenosis.  Left ventriculography: Left ventricular systolic function is normal, LVEF is estimated at 65%, there is no significant mitral regurgitation   Contrast: 50 cc Omnipaque  Estimated Blood Loss: Minimal  Final Conclusions:   1. Critical left main and multivessel CAD 2. Normal LV systolic function with normal LVEDP  Recommendations: The patient will require urgent CABG. Will start him on IV heparin. He is pain-free at rest and in fact has had no resting chest discomfort. However, he has had progressive symptoms with lower levels of exertion over the past few weeks. He will be admitted, started on IV heparin, and monitored carefully until CABG.  Sherren Mocha MD, [facility] 02/12/2014, 3:43 PM

## 2014-02-12 NOTE — Progress Notes (Signed)
Utilization Review Completed.Keymoni Mccaster T9/29/2015  

## 2014-02-12 NOTE — Progress Notes (Signed)
ANTICOAGULATION CONSULT NOTE - Initial Consult  Pharmacy Consult for Heparin Indication: chest pain/ACS  No Known Allergies  Patient Measurements: Height: 5\' 8"  (172.7 cm) Weight: 150 lb (68.04 kg) IBW/kg (Calculated) : 68.4 Heparin Dosing Weight: 68 kg  Vital Signs: Temp: 99.5 F (37.5 C) (09/29 0948) Temp src: Oral (09/29 0948) BP: 136/79 mmHg (09/29 0948) Pulse Rate: 87 (09/29 1500)  Labs:  Recent Labs  02/11/14 1233  HGB 14.6  HCT 43.6  PLT 266.0  LABPROT 12.2  INR 1.1*  CREATININE 1.0    Estimated Creatinine Clearance: 72.7 ml/min (by C-G formula based on Cr of 1).   Medical History: Past Medical History  Diagnosis Date  . Environmental allergies   . Asthma   . Hyperlipidemia   . Colon polyp   . Adenocarcinoma     of the prostate, 5% of single core 01/2011-watching and waiting for now  . Kidney stone   . Prediabetes     Medications:  Prescriptions prior to admission  Medication Sig Dispense Refill  . albuterol (PROAIR HFA) 108 (90 BASE) MCG/ACT inhaler Inhale 1-2 puffs into the lungs daily as needed for wheezing or shortness of breath.      Marland Kitchen aspirin EC 325 MG tablet Take 1 tablet (325 mg total) by mouth daily.  1 tablet  0  . atorvastatin (LIPITOR) 40 MG tablet Take 40 mg by mouth at bedtime.       . fluticasone (FLONASE) 50 MCG/ACT nasal spray Place 2 sprays into both nostrils at bedtime.       . mometasone-formoterol (DULERA) 200-5 MCG/ACT AERO Inhale 2 puffs into the lungs at bedtime.      . Multiple Vitamin (MULTIVITAMIN WITH MINERALS) TABS tablet Take 1 tablet by mouth daily.      . nitroGLYCERIN (NITROSTAT) 0.4 MG SL tablet Place 0.4 mg under the tongue every 5 (five) minutes as needed for chest pain.       Scheduled:  . aspirin      . [START ON 02/13/2014] aspirin EC  325 mg Oral Daily  . atorvastatin  40 mg Oral QHS  . fluticasone  2 spray Each Nare QHS  . metoprolol tartrate  12.5 mg Oral BID  . mometasone-formoterol  2 puff Inhalation  QHS   Infusions:  . sodium chloride      Assessment: 63yo male presented today for L heart cath, which revealed a need for urgent CABG. Pharmacy is consulted to dose heparin starting 4 hours post-sheath removal. Sheath was removed at 1535 per nursing. CBC was normal, sCr 1.0, CrCl ~70 mL/min, INR 1.1 from 9/28 labs. Will start heparin at 1930.  Goal of Therapy:  Heparin level 0.3-0.7 units/ml Monitor platelets by anticoagulation protocol: Yes   Plan:  Start heparin infusion at 800 units/hr Check anti-Xa level in 6 hours and daily while on heparin Continue to monitor H&H and platelets   Andrey Cota. Diona Foley, PharmD Clinical Pharmacist Pager [phone] 02/12/2014,3:52 PM

## 2014-02-12 NOTE — Progress Notes (Signed)
Pt states he feels "terrible" and has a sore scratchy throat and body aches. His temp is 99.5. He has a congested cough.  Reported this to Jennifer,R.N. Who will discuss this with Dr. Burt Knack for a plan of care.

## 2014-02-12 NOTE — Consult Note (Addendum)
[address]       ,[place] [address]             [phone]          CARDIOTHORACIC SURGERY CONSULTATION REPORT  PCP is Shirline Frees, MD Referring Provider is Sherren Mocha, MD Primary Cardiologist is Dunn, Melvin Hong, MD   Reason for consultation:  Left main disease with multivessel coronary artery disease  HPI:  Patient is a 63 year old white male from [place] with no previous history of coronary artery disease but risk factors notable for history of hyperlipidemia and borderline type 2 diabetes mellitus.  The patient states that he first began to experience exertional substernal chest discomfort approximately 6-8 months ago. The pain is described as a tightness or squeezing pain radiating across his left chest which only develops in association with relatively strenuous physical exertion. Discomfort can be associated with shortness breath and is always promptly relieved by rest. Over the past 6-8 months symptoms have progressed such that now occur more frequently and with less strenuous activity. However, the patient denies any history of chest discomfort occurring at rest or waking him from his sleep. He he has still been able to tend to most of his daily activities as long as he is careful to avoid more strenuous exertion.  He reports these symptoms to his primary care physician who promptly referred the patient for cardiology consultation. The patient was seen by cost Dr. Radford Pax and noted to have a systolic murmur on physical exam. The patient underwent transthoracic echocardiogram and nuclear stress test yesterday. Transthoracic echocardiogram demonstrated normal left ventricular systolic function and mild aortic stenosis with mean transvalvular gradient measured 17 mmHg across the aortic valve. Nuclear stress test was markedly abnormal and considered high-risk with moderate size area of reversible ischemia demonstrated in the inferorapical wall and  marked ST segment depression in the inferolateral leads with stress.  The patient was promptly scheduled for diagnostic cardiac catheterization which was performed earlier today by Dr. Burt Knack. The patient was found to have critical left main coronary artery stenosis with severe three-vessel coronary artery disease. Left ventricular systolic function was normal.  The patient was referred for surgical consultation.  The patient is married and lives with his wife in [place].  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 His job does not require strenuous physical exertion.  He reports no significant physical limitations prior to the development of exertional chest discomfort. He denies any history of nocturnal angina or chest discomfort occurring at rest. He denies any history of episodes of chest discomfort that lasted for more than a few minutes.  He denies any history of resting shortness of breath, PND, orthopnea, lower extremity edema, palpitations, dizzy spells, or syncope.  Past Medical History  Diagnosis Date  . Environmental allergies   . Asthma   . Hyperlipidemia   . Colon polyp   . Adenocarcinoma     of the prostate, 5% of single core 01/2011-watching and waiting for now  . Kidney stone   . Prediabetes   . Hyperlipemia   . Type II or unspecified type diabetes mellitus without mention of complication, not stated as uncontrolled, borderline   . Aortic stenosis 02/11/2014  . Left main coronary artery disease 02/12/2014  . Coronary artery disease involving native coronary artery with angina pectoris 02/12/2014    History reviewed. No pertinent past surgical history.  Family History  Problem Relation Age of  Onset  . Alzheimer's disease Mother   . Cancer - Lung Father     History   Social History  . Marital Status: Single    Spouse Name: N/A    Number of Children: N/A  . Years of Education: N/A   Occupational History  . Not on file.    Social History Main Topics  . Smoking status: Never Smoker   . Smokeless tobacco: Not on file  . Alcohol Use: No  . Drug Use: No  . Sexual Activity: Not on file   Other Topics Concern  . Not on file   Social History Narrative  . No narrative on file    Prior to Admission medications   Medication Sig Start Date End Date Taking? Authorizing Provider  albuterol (PROAIR HFA) 108 (90 BASE) MCG/ACT inhaler Inhale 1-2 puffs into the lungs daily as needed for wheezing or shortness of breath.   Yes Historical Provider, MD  aspirin EC 325 MG tablet Take 1 tablet (325 mg total) by mouth daily. 02/11/14  Yes Sherren Mocha, MD  atorvastatin (LIPITOR) 40 MG tablet Take 40 mg by mouth at bedtime.  01/18/14  Yes Historical Provider, MD  fluticasone (FLONASE) 50 MCG/ACT nasal spray Place 2 sprays into both nostrils at bedtime.  01/16/14  Yes Historical Provider, MD  mometasone-formoterol (DULERA) 200-5 MCG/ACT AERO Inhale 2 puffs into the lungs at bedtime.   Yes Historical Provider, MD  Multiple Vitamin (MULTIVITAMIN WITH MINERALS) TABS tablet Take 1 tablet by mouth daily.   Yes Historical Provider, MD  nitroGLYCERIN (NITROSTAT) 0.4 MG SL tablet Place 0.4 mg under the tongue every 5 (five) minutes as needed for chest pain.    Historical Provider, MD    Current Facility-Administered Medications  Medication Dose Route Frequency Provider Last Rate Last Dose  . 0.9 %  sodium chloride infusion   Intravenous Continuous Sherren Mocha, MD 125 mL/hr at 02/12/14 1616    . acetaminophen (TYLENOL) tablet 650 mg  650 mg Oral Q4H PRN Sherren Mocha, MD      . albuterol (PROVENTIL) (2.5 MG/3ML) 0.083% nebulizer solution 2.5 mg  2.5 mg Inhalation Daily PRN Sherren Mocha, MD      . Derrill Memo ON 02/13/2014] aminocaproic acid (AMICAR) 10 g in sodium chloride 0.9 % 100 mL infusion   Intravenous To [place], RPH      . aspirin 81 MG chewable tablet           . [START ON 02/13/2014] aspirin EC tablet 325 mg  325 mg  Oral Daily Sherren Mocha, MD      . atorvastatin (LIPITOR) tablet 40 mg  40 mg Oral QHS Sherren Mocha, MD      . bisacodyl (DULCOLAX) EC tablet 5 mg  5 mg Oral Once Rexene Alberts, MD      . Derrill Memo ON 02/13/2014] cefUROXime (ZINACEF) 1.5 g in dextrose 5 % 50 mL IVPB  1.5 g Intravenous To [place], RPH      . [START ON 02/13/2014] cefUROXime (ZINACEF) 750 mg in dextrose 5 % 50 mL IVPB  750 mg Intravenous To [place], RPH      . chlorhexidine (HIBICLENS) 4 % liquid 4 application  60 mL Topical Once Rexene Alberts, MD       And  . Derrill Memo ON 02/13/2014] chlorhexidine (HIBICLENS) 4 % liquid 4 application  60 mL Topical Once Rexene Alberts, MD      . Derrill Memo ON 02/13/2014]  dexmedetomidine (PRECEDEX) 400 MCG/100ML (4 mcg/mL) infusion  0.1-0.7 mcg/kg/hr Intravenous To OR Rebecka Apley, RPH      . [START ON 02/13/2014] DOPamine (INTROPIN) 800 mg in dextrose 5 % 250 mL (3.2 mg/mL) infusion  2-20 mcg/kg/min Intravenous To [place], RPH      . [START ON 02/13/2014] EPINEPHrine (ADRENALIN) 4 mg in dextrose 5 % 250 mL (0.016 mg/mL) infusion  0.5-20 mcg/min Intravenous To OR Rebecka Apley, RPH      . fluticasone (FLONASE) 50 MCG/ACT nasal spray 2 spray  2 spray Each Nare QHS Sherren Mocha, MD      . Derrill Memo ON 02/13/2014] heparin 2,500 Units, papaverine 30 mg in electrolyte-148 (PLASMALYTE-148) 500 mL irrigation   Irrigation To [place], RPH      . [START ON 02/13/2014] heparin 30,000 units/NS 1000 mL solution for CELLSAVER   Other To [place], RPH      . heparin ADULT infusion 100 units/mL (25000 units/250 mL)  800 Units/hr Intravenous Continuous Rebecka Apley, [facility]      . [START ON 02/13/2014] insulin regular (NOVOLIN R,HUMULIN R) 250 Units in sodium chloride 0.9 % 250 mL (1 Units/mL) infusion   Intravenous To OR Rebecka Apley, RPH      . [START ON 02/13/2014] magnesium sulfate (IV Push/IM) injection 40 mEq  40 mEq Other To [place], RPH      . metoprolol tartrate (LOPRESSOR)  tablet 12.5 mg  12.5 mg Oral BID Sherren Mocha, MD      . Derrill Memo ON 02/13/2014] metoprolol tartrate (LOPRESSOR) tablet 12.5 mg  12.5 mg Oral Once Rexene Alberts, MD      . mometasone-formoterol [facility]) 200-5 MCG/ACT inhaler 2 puff  2 puff Inhalation QHS Sherren Mocha, MD      . Derrill Memo ON 02/13/2014] nitroGLYCERIN 50 mg in dextrose 5 % 250 mL (0.2 mg/mL) infusion  2-200 mcg/min Intravenous To OR Rebecka Apley, RPH      . ondansetron [facility]) injection 4 mg  4 mg Intravenous Q6H PRN Sherren Mocha, MD      . oxyCODONE-acetaminophen (PERCOCET/ROXICET) 5-325 MG per tablet 1-2 tablet  1-2 tablet Oral Q4H PRN Sherren Mocha, MD      . Derrill Memo ON 02/13/2014] phenylephrine (NEO-SYNEPHRINE) 20 mg in dextrose 5 % 250 mL (0.08 mg/mL) infusion  30-200 mcg/min Intravenous To [place], RPH      . [START ON 02/13/2014] potassium chloride injection 80 mEq  80 mEq Other To OR Rebecka Apley, RPH      . temazepam (RESTORIL) capsule 15 mg  15 mg Oral Once PRN Rexene Alberts, MD      . Derrill Memo ON 02/13/2014] vancomycin (VANCOCIN) 1,000 mg in sodium chloride 0.9 % 1,000 mL irrigation   Irrigation To OR Rebecka Apley, [facility]      . [START ON 02/13/2014] vancomycin (VANCOCIN) 1,250 mg in sodium chloride 0.9 % 250 mL IVPB  1,250 mg Intravenous To OR Rebecka Apley, RPH        No Known Allergies    Review of Systems:   General:  normal appetite, decreased energy, no weight gain, no weight loss, no fever  Cardiac:  + chest pain with exertion, no chest pain at rest, no SOB with exertion, no resting SOB, no PND, no orthopnea, no palpitations, no arrhythmia, no atrial fibrillation, no LE edema, no dizzy spells, no syncope  Respiratory:  no shortness of breath,  no home oxygen, + intermittent productive cough, + intermittent dry cough, no bronchitis, no wheezing, no hemoptysis, no asthma, no pain with inspiration or cough, no sleep apnea, no CPAP at night  GI:   no difficulty swallowing, no reflux, no frequent heartburn, no  hiatal hernia, no abdominal pain, no constipation, no diarrhea, no hematochezia, no hematemesis, no melena  GU:   no dysuria,  no frequency, no urinary tract infection, no hematuria, no enlarged prostate, + kidney stones, no kidney disease  Vascular:  no pain suggestive of claudication, no pain in feet, no leg cramps, no varicose veins, no DVT, no non-healing foot ulcer  Neuro:   no stroke, no TIA's, no seizures, no headaches, no temporary blindness one eye,  no slurred speech, no peripheral neuropathy, + mild chronic pain in left ankle, no instability of gait, no memory/cognitive dysfunction  Musculoskeletal: + arthritis left ankle, no joint swelling, no myalgias, no difficulty walking, normal mobility   Skin:   no rash, no itching, no skin infections, no pressure sores or ulcerations  Psych:   no anxiety, no depression, no nervousness, no unusual recent stress  Eyes:   no blurry vision, no floaters, no recent vision changes, + wears glasses or contacts  ENT:   no hearing loss, no loose or painful teeth, no dentures, last saw dentist approx 1 year ago  Hematologic:  no easy bruising, no abnormal bleeding, no clotting disorder, no frequent epistaxis  Endocrine:  + "borderline" diabetes, does not check CBG's at home     Physical Exam:   BP 138/74  Pulse 89  Temp(Src) 98.6 F (37 C) (Oral)  Resp 18  Ht 5\' 8"  (1.727 m)  Wt 68.04 kg (150 lb)  BMI 22.81 kg/m2  SpO2 95%  General:  Thin,  well-appearing  HEENT:  Unremarkable   Neck:   no JVD, no bruits, no adenopathy   Chest:   clear to auscultation, symmetrical breath sounds, no wheezes, no rhonchi   CV:   RRR, grade II-III systolic murmur best LLSB  Abdomen:  soft, non-tender, no masses   Extremities:  warm, well-perfused, pulses diminished but palpable, no lower extremity edema  Rectal/GU  Deferred  Neuro:   Grossly non-focal and symmetrical throughout  Skin:   Clean and dry, no rashes, no breakdown  Diagnostic Tests:  Transthoracic  Echocardiography  Patient: Melvin Dunn, Melvin Dunn MR #: [id number] Study Date: 02/11/2014 Gender: M Age: 16 Height: 170.2 cm Weight: 68 kg BSA: 1.8 m^2 Pt. Status: Room:  ORDERING Fransico Him, MD REFERRING Fransico Him, MD [place], M.D. SONOGRAPHER Cindy Hazy, RDCS PERFORMING [facility], Outpatient  cc:  ------------------------------------------------------------------- LV EF: 60% - 65%  ------------------------------------------------------------------- Indications: Murmur 785.2.  ------------------------------------------------------------------- History: PMH: Chest pain. Shortness of breath. Risk factors: Dyslipidemia.  ------------------------------------------------------------------- Study Conclusions  - Left ventricle: The cavity size was normal. Wall thickness was normal. Systolic function was normal. The estimated ejection fraction was in the range of 60% to 65%. Doppler parameters are consistent with abnormal left ventricular relaxation (grade 1 diastolic dysfunction). - Aortic valve: AV is thickened, calcified with mildly restricted motion. Peak and mean gradients through the valve are 23 and 17 mm Hg respectively consistent with mild AS. - Mitral valve: There was mild regurgitation.  Transthoracic echocardiography. M-mode, complete 2D, spectral Doppler, and color Doppler. Birthdate: Patient birthdate: [birth year]. Age: Patient is 63 yr old. Sex: Gender: male. BMI: 23.5 kg/m^2. Patient status: Outpatient. Study date: Study date: 02/11/2014. Study time: 07:49 AM. Location: [facility]  3  -------------------------------------------------------------------  ------------------------------------------------------------------- Left ventricle: The cavity size was normal. Wall thickness was normal. Systolic function was normal. The estimated ejection fraction was in the range of 60% to 65%. Doppler parameters are consistent with abnormal left  ventricular relaxation (grade 1 diastolic dysfunction).  ------------------------------------------------------------------- Aortic valve: AV is thickened, calcified with mildly restricted motion. Peak and mean gradients through the valve are 23 and 17 mm Hg respectively consistent with mild AS. Doppler: VTI ratio of LVOT to aortic valve: 0.37. Valve area (VTI): 1 cm^2. Indexed valve area (VTI): 0.56 cm^2/m^2. Valve area (Vmax): 1.04 cm^2. Indexed valve area (Vmax): 0.58 cm^2/m^2. Mean velocity ratio of LVOT to aortic valve: 0.37. Valve area (Vmean): 1 cm^2. Indexed valve area (Vmean): 0.55 cm^2/m^2. Mean gradient (S): 16 mm Hg. Peak gradient (S): 22 mm Hg.  ------------------------------------------------------------------- Mitral valve: Mildly thickened leaflets . Doppler: There was mild regurgitation. Peak gradient (D): 4 mm Hg.  ------------------------------------------------------------------- Left atrium: The atrium was normal in size.  ------------------------------------------------------------------- Right ventricle: The cavity size was normal. Wall thickness was normal. Systolic function was normal.  ------------------------------------------------------------------- Pulmonic valve: Structurally normal valve. Cusp separation was normal. Doppler: Transvalvular velocity was within the normal range. There was no regurgitation.  ------------------------------------------------------------------- Tricuspid valve: Structurally normal valve. Leaflet separation was normal. Doppler: Transvalvular velocity was within the normal range. There was trivial regurgitation.  ------------------------------------------------------------------- Right atrium: The atrium was normal in size.  ------------------------------------------------------------------- Pericardium: There was no pericardial  effusion.  ------------------------------------------------------------------- Measurements  Left ventricle Value Reference LV ID, ED, PLAX chordal 43.4 mm 43 - 52 LV ID, ES, PLAX chordal 28.4 mm 23 - 38 LV fx shortening, PLAX chordal 35 % >=29 LV PW thickness, ED 11 mm --------- IVS/LV PW ratio, ED 0.99 <=1.3 Stroke volume, 2D 54.4 ml --------- Stroke volume/bsa, 2D 30 ml/m^2 --------- LV e&', lateral 8.9 cm/s --------- LV E/e&', lateral 10.52 --------- LV e&', medial 10.2 cm/s --------- LV E/e&', medial 9.18 --------- LV e&', average 9.55 cm/s --------- LV E/e&', average 9.8 ---------  Ventricular septum Value Reference IVS thickness, ED 10.9 mm ---------  LVOT Value Reference LVOT ID, S 18.6 mm --------- LVOT area 2.72 cm^2 --------- LVOT mean velocity, S 72.9 cm/s --------- LVOT VTI, S 20 cm ---------  Aortic valve Value Reference Aortic valve peak velocity, S 234 cm/s --------- Aortic valve mean velocity, S 199 cm/s --------- Aortic valve VTI, S 54.2 cm --------- Aortic mean gradient, S 16 mm Hg --------- Aortic peak gradient, S 22 mm Hg --------- VTI ratio, LVOT/AV 0.37 --------- Aortic valve area, VTI 1 cm^2 --------- Aortic valve area/bsa, VTI 0.56 cm^2/m^2 --------- Aortic valve area, peak velocity 1.04 cm^2 --------- Aortic valve area/bsa, peak 0.58 cm^2/m^2 --------- velocity Velocity ratio, mean, LVOT/AV 0.37 --------- Aortic valve area, mean velocity 1 cm^2 --------- Aortic valve area/bsa, mean 0.55 cm^2/m^2 --------- velocity  Aorta Value Reference Aortic root ID, ED 30.3 mm ---------  Left atrium Value Reference LA ID, A-P, ES 35.2 mm --------- LA ID/bsa, A-P 1.96 cm/m^2 <=2.2  Mitral valve Value Reference Mitral E-wave peak velocity 93.6 cm/s --------- Mitral A-wave peak velocity 97.2 cm/s --------- Mitral peak gradient, D 4 mm Hg --------- Mitral E/A ratio, peak 0.96 ---------  Right ventricle Value Reference RV s&', lateral, S 17.7 cm/s  ---------  Legend: (L) and (H) mark values outside specified reference range.  ------------------------------------------------------------------- Prepared and Electronically Authenticated by  Dorris Carnes, M.D. 2015-09-28T09:59:43         Cardiology Nuclear Med Study   [name] is  a 63 y.o. male MRN : [id number] DOB: [birth year]  Procedure Date: 02/11/2014  Nuclear Med Background  Indication for Stress Test: Evaluation for Ischemia  History: ASTHMA  Cardiac Risk Factors: Lipids  Symptoms: Lipids  Nuclear Pre-Procedure  Caffeine/Decaff Intake: None> 12 hrs  NPO After: 7:00pm   Lungs: clear  O2 Sat: 96/97% on room air.  IV 0.9% NS with Angio Cath: 22g   IV Site: R Antecubital x 1, tolerated well  IV Started by: Irven Baltimore, RN   Chest Size (in): 42  Cup Size: n/a   Height: 5\' 7"  (1.702 m)  Weight: 150 lb (68.04 kg)   BMI: Body mass index is 23.49 kg/(m^2).  Tech Comments: This patient had chest pain, sob,he became very pale/gray, had blunted BP's and his BP dropped as soon as he went into recovery. S.Williams EMTP   Nuclear Med Study  1 or 2 day study: 1 day  Stress Test Type: Stress   Reading MD: N/A  Order Authorizing Provider: Fransico Him, MD   Resting Radionuclide: Technetium 68m Sestamibi  Resting Radionuclide Dose: 11.0 mCi   Stress Radionuclide: Technetium 13m Sestamibi  Stress Radionuclide Dose: 33.0 mCi   Stress Protocol  Rest HR: 65  Stress HR: 137   Rest BP: 135/71  Stress BP: 166/78   Exercise Time (min): 6:50  METS: 7.60   Predicted Max HR: 157 bpm  % Max HR: 87.26 bpm  Rate Pressure Product: 22742  Dose of Adenosine (mg): n/a  Dose of Lexiscan: n/a mg   Dose of Atropine (mg): n/a  Dose of Dobutamine: n/a mcg/kg/min (at max HR)   Stress Test Technologist: Perrin Maltese, EMT-P  Nuclear Technologist: Earl Many, CNMT   Rest Procedure: Myocardial perfusion imaging was performed at rest 45 minutes following the intravenous administration of Technetium  63m Sestamibi.  Rest ECG: NSR - Normal EKG  Stress Procedure: The patient exercised on the treadmill utilizing the Bruce Protocol for 6:50 minutes. The patient stopped due to sob, fatigue, he was very pale, and chest pain. Technetium 65m Sestamibi was injected at peak exercise and myocardial perfusion imaging was performed after a brief delay.  Stress ECG: Significant ST abnormalities consistent with ischemia.  QPS  Raw Data Images: There is interference from nuclear activity from structures below the diaphragm. This does not affect the ability to read the study.  Stress Images: There is decreased uptake in the inferior wall.  Rest Images: Normal homogeneous uptake in all areas of the myocardium.  Subtraction (SDS): These findings are consistent with ischemia.  Transient Ischemic Dilatation (Normal <1.22): 1.24  Lung/Heart Ratio (Normal <0.45): 0.35  Quantitative Gated Spect Images  QGS EDV: 94 ml  QGS ESV: 46 ml  Impression  Exercise Capacity: Good exercise capacity.  BP Response: Normal blood pressure response.  Clinical Symptoms: Significant chest pain.  ECG Impression: Significant ST abnormalities consistent with ischemia.  Comparison with Prior Nuclear Study: No previous nuclear study performed  Overall Impression: High risk stress nuclear study. With stress there is a medium size moderate intensity reversible inferoapical defect. There are marked ST segment depression up to 5 mm in inferolateral leads with stress.  LV Ejection Fraction: 51%. LV Wall Motion: NL LV Function; NL Wall Motion  Darlin Coco MD    Cardiac Catheterization Procedure Note  Name: KINGSTON SHAWGO  MRN: [id number]  DOB: [birth year]  Procedure: Left Heart Cath, Selective Coronary Angiography, LV angiography  Indication: Progressive exertional angina, now CCS Class 3 symptoms, high-risk Myoview  Procedural  Details: The right wrist was prepped, draped, and anesthetized with 1% lidocaine. Using the modified  Seldinger technique, a 5/6 French Slender sheath was introduced into the right radial artery. 3 mg of verapamil was administered through the sheath, weight-based unfractionated heparin was administered intravenously. Standard Judkins catheters were used for selective coronary angiography and left ventriculography. Catheter exchanges were performed over an exchange length guidewire. There were no immediate procedural complications. A TR band was used for radial hemostasis at the completion of the procedure. The patient was transferred to the post catheterization recovery area for further monitoring.  Procedural Findings:  Hemodynamics:  AO 123/65  LV 125/7  Coronary angiography:  Coronary dominance: right  Left mainstem: The left main is heavily calcified. The mid to distal left main have 95% critical stenosis. The proximal left main is patent. The vessel divides into the LAD and left circumflex.  Left anterior descending (LAD): The left main lesion appears to extend into the ostium and proximal portion of the LAD with 90% associated stenosis at the ostium of the LAD. The proximal LAD at the first diagonal branch has 50% stenosis. The mid LAD is patent with minor diffuse irregularity. The diagonal branches are patent.  Left circumflex (LCx): The left circumflex is patent. There is nonobstructive disease present. The first obtuse marginal branch has 40-50% ostial stenosis. This vessel is medium in caliber. The second OM is smaller in caliber without significant disease noted. The AV circumflex beyond the second OM is very small.  Right coronary artery (RCA): This is a dominant, heavily calcified vessel. The mid vessel has 95% stenosis. At the junction of the mid and distal vessel, there is another 95% stenosis. The PDA is patent. The PLA branch has a 95% stenosis.  Left ventriculography: Left ventricular systolic function is normal, LVEF is estimated at 65%, there is no significant mitral regurgitation   Contrast: 50 cc Omnipaque  Estimated Blood Loss: Minimal  Final Conclusions:  1. Critical left main and multivessel CAD  2. Normal LV systolic function with normal LVEDP  Recommendations: The patient will require urgent CABG. Will start him on IV heparin. He is pain-free at rest and in fact has had no resting chest discomfort. However, he has had progressive symptoms with lower levels of exertion over the past few weeks. He will be admitted, started on IV heparin, and monitored carefully until CABG.  Sherren Mocha MD, [facility]  02/12/2014, 3:43 PM   Impression:  Patient has critical left main disease and three-vessel coronary artery disease with preserved left ventricular systolic function and progressive symptoms of exertional angina.  I have personally reviewed the patient's cardiac catheterization films and transthoracic echocardiogram.  I agree that he needs urgent coronary artery bypass grafting.  The patient also has aortic stenosis which is probably mild in severity.  Images from the patient's echocardiogram demonstrate mild thickening with some degree of restricted leaflet mobility of all three leaflets of the aortic valve.  The LVOT measured approximately 1.9 cm in diameter just below the aortic annulus.  Peak velocity measured across the aortic valve measured approximately 2.3-2.5 m/s corresponding to a mean transvalvular gradient of 17 mm mercury.  Based upon these hemodynamic findings it might be reasonable to leave the aortic valve alone at the time of surgery, but given the patient's relatively young age he will likely remain at significant risk for subsequent progression of disease in the coming years. I favor careful repeat examination of the aortic valve using transesophageal echocardiogram at the time  of surgery with the plan to proceed with aortic valve replacement if findings are consistent with the presence of moderate aortic stenosis or structural characteristics suggestive that the  patient would be at significant risk for progression of disease within 3-5 years.   Plan:  I have reviewed the indications, risks, and potential benefits of coronary artery bypass grafting with the patient and his family.  Alternative treatment strategies have been discussed.  The patient understands and accepts all potential associated risks of surgery including but not limited to risk of death, stroke or other neurologic complication, myocardial infarction, congestive heart failure, respiratory failure, renal failure, bleeding requiring blood transfusion and/or reexploration, aortic dissection or other major vascular complication, arrhythmia, heart block or bradycardia requiring permanent pacemaker, pneumonia, pleural effusion, wound infection, pulmonary embolus or other thromboembolic complication, chronic pain or other delayed complications related to median sternotomy, or the late recurrence of symptomatic ischemic heart disease and/or congestive heart failure.  The importance of long term risk modification have been emphasized.  The patient was also counseled at length regarding alternatives with respect to the patient's aortic stenosis including continued medical therapy versus proceeding with conventional surgical aortic valve replacement using either a mechanical prosthesis or a bioprosthetic tissue valve at the time of coronary artery bypass grafting.  The risks of progression of severity of native valve aortic stenosis was compared in contrast with risks associated with surgical aortic valve replacement and the presence of an artificial aortic valve prosthesis.  Discussion was held comparing the relative risks of mechanical valve replacement with need for lifelong anticoagulation versus use of a bioprosthetic tissue valve and the associated potential for late structural valve deterioration and failure.  This discussion was placed in the context of the patient's particular circumstances, and as a  result the patient specifically requests that if his valve needs to be be replaced he wants to have it replaced using a bioprosthetic tissue valve .  All questions answered.  For OR in am tomorrow.   I spent in excess of 120 minutes during the conduct of this hospital consultation and >50% of this time involved direct face-to-face encounter for counseling and/or coordination of the patient's care.   Valentina Gu. Roxy Manns, MD 02/12/2014 6:17 PM

## 2014-02-12 NOTE — H&P (View-Only) (Signed)
[place], [place]  [place], [place] [address]  Phone: [phone]  Fax: [phone]  Date: 01/29/2014  ID: Melvin Dunn, DOB 05/09/1951, MRN [id number]  PCP: Shirline Frees, MD  Cardiologist: Fransico Him, MD  History of Present Illness:  Melvin Dunn is a 63 y.o. male with a history of dyslipidemia, prediabetes and asthma who presents today for evaluation of SOB and chest pain. He recently saw Dr. [name] and complained pressure in his chest when he would exert himself. He has also noticed SOB with exertion that is different from his asthma. He denies any nausea but occasionally will have some diaphoresis with the pain. EKG shows NSR at baseline.  Wt Readings from Last 3 Encounters:   02/11/14  150 lb (68.04 kg)   01/29/14  156 lb 9.6 oz (71.033 kg)    Past Medical History   Diagnosis  Date   .  Environmental allergies    .  Asthma    .  Hyperlipidemia    .  Colon polyp    .  Adenocarcinoma      of the prostate, 5% of single core 01/2011-watching and waiting for now   .  Kidney stone    .  Prediabetes     Current Outpatient Prescriptions   Medication  Sig  Dispense  Refill   .  atorvastatin (LIPITOR) 40 MG tablet  Take 40 mg by mouth at bedtime.     .  fluticasone (FLONASE) 50 MCG/ACT nasal spray  Place 2 sprays into both nostrils at bedtime.     Marland Kitchen  albuterol (PROAIR HFA) 108 (90 BASE) MCG/ACT inhaler  Inhale 1-2 puffs into the lungs daily as needed for wheezing or shortness of breath.     Marland Kitchen  aspirin EC 325 MG tablet  Take 1 tablet (325 mg total) by mouth daily.  1 tablet  0   .  mometasone-formoterol (DULERA) 200-5 MCG/ACT AERO  Inhale 2 puffs into the lungs at bedtime.     .  Multiple Vitamin (MULTIVITAMIN WITH MINERALS) TABS tablet  Take 1 tablet by mouth daily.     .  nitroGLYCERIN (NITROSTAT) 0.4 MG SL tablet  Place 0.4 mg under the tongue every 5 (five) minutes as needed for chest pain.      No current facility-administered medications for this visit.    Allergies:  No Known Allergies  Social History: The patient reports that he has never smoked. He does not have any smokeless tobacco history on file. He reports that he does not drink alcohol or use illicit drugs.  Family History: The patient's family history includes Alzheimer's disease in his mother; Cancer - Lung in his father.  ROS: Please see the history of present illness. All other systems reviewed and negative.  PHYSICAL EXAM:  VS: BP 132/90  Pulse 81  Ht 5\' 7"  (1.702 m)  Wt 156 lb 9.6 oz (71.033 kg)  BMI 24.52 kg/m2  Well nourished, well developed, in no acute distress  HEENT: normal  Neck: no JVD  Cardiac: normal S1, S2; RRR; no murmur  Lungs: clear to auscultation bilaterally, no wheezing, rhonchi or rales  Abd: soft, nontender, no hepatomegaly  Ext: no edema  Skin: warm and dry  Neuro: CNs 2-12 intact, no focal abnormalities noted  EKG: NSR with no ST/T wave abnormality  ASSESSMENT AND PLAN:  1. Chest pain with exertion associated with DOE worrisome for exertional angina. His CRF include dyslipidemia and prediabetes. - will get  a Stress myoview to rule out ischemia  - check 2D echo to evaluate LVF  2. Dyslipidemia 3. Prediabetes Followup pending results of studies  Signed,  Fransico Him, MD  [facility]  01/29/2014

## 2014-02-12 NOTE — Interval H&P Note (Signed)
History and Physical Interval Note:  02/12/2014 2:52 PM  Melvin Dunn  has presented today for surgery, with the diagnosis of cp/abnormal mioview  The various methods of treatment have been discussed with the patient and family. After consideration of risks, benefits and other options for treatment, the patient has consented to  Procedure(s): LEFT HEART CATHETERIZATION WITH CORONARY ANGIOGRAM (N/A) as a surgical intervention .  The patient's history has been reviewed, patient examined, no change in status, stable for surgery.  I have reviewed the patient's chart and labs.  Questions were answered to the patient's satisfaction.    High risk Myoview. I reviewed risks, indications, alternatives to cath and PCI yesterday with the patient in the office.   Cath Lab Visit (complete for each Cath Lab visit)  Clinical Evaluation Leading to the Procedure:   ACS: No.  Non-ACS:    Anginal Classification: CCS III  Anti-ischemic medical therapy: No Therapy  Non-Invasive Test Results: High-risk stress test findings: cardiac mortality >3%/year  Prior CABG: No previous CABG       Sherren Mocha

## 2014-02-12 NOTE — Progress Notes (Signed)
TR band removed from patients right wrist at 2017. Level zero. 2x2 gauze applied with tape. Patient radial pulse 2+. Movement in wrist not painful. Instructed patient to call RN if bleeding noted. Instructions understood. Will monitor closely.

## 2014-02-13 ENCOUNTER — Encounter (HOSPITAL_COMMUNITY): Payer: BC Managed Care – PPO | Admitting: Anesthesiology

## 2014-02-13 ENCOUNTER — Inpatient Hospital Stay (HOSPITAL_COMMUNITY): Payer: BC Managed Care – PPO

## 2014-02-13 ENCOUNTER — Encounter (HOSPITAL_COMMUNITY)
Admission: RE | Disposition: A | Discharge status: Home / Self Care | Payer: BC Managed Care – PPO | Source: Ambulatory Visit | Attending: Thoracic Surgery (Cardiothoracic Vascular Surgery)

## 2014-02-13 ENCOUNTER — Inpatient Hospital Stay (HOSPITAL_COMMUNITY): Payer: BC Managed Care – PPO | Admitting: Anesthesiology

## 2014-02-13 ENCOUNTER — Encounter (HOSPITAL_COMMUNITY): Payer: Self-pay | Admitting: Certified Registered"

## 2014-02-13 DIAGNOSIS — I2510 Atherosclerotic heart disease of native coronary artery without angina pectoris: Secondary | ICD-10-CM

## 2014-02-13 DIAGNOSIS — Z951 Presence of aortocoronary bypass graft: Secondary | ICD-10-CM

## 2014-02-13 HISTORY — PX: CORONARY ARTERY BYPASS GRAFT: SHX141

## 2014-02-13 HISTORY — PX: INTRAOPERATIVE TRANSESOPHAGEAL ECHOCARDIOGRAM: SHX5062

## 2014-02-13 LAB — POCT I-STAT, CHEM 8
BUN: 14 mg/dL (ref 6–23)
BUN: 11 mg/dL (ref 6–23)
BUN: 14 mg/dL (ref 6–23)
BUN: 12 mg/dL (ref 6–23)
BUN: 12 mg/dL (ref 6–23)
BUN: 10 mg/dL (ref 6–23)
CALCIUM ION: 1.18 mmol/L (ref 1.13–1.30)
CALCIUM ION: 1.08 mmol/L — AB (ref 1.13–1.30)
CHLORIDE: 105 meq/L (ref 96–112)
CHLORIDE: 103 meq/L (ref 96–112)
CHLORIDE: 99 meq/L (ref 96–112)
CREATININE: 0.5 mg/dL (ref 0.50–1.35)
CREATININE: 0.8 mg/dL (ref 0.50–1.35)
CREATININE: 0.8 mg/dL (ref 0.50–1.35)
Calcium, Ion: 1.17 mmol/L (ref 1.13–1.30)
Calcium, Ion: 0.85 mmol/L — ABNORMAL LOW (ref 1.13–1.30)
Calcium, Ion: 1.04 mmol/L — ABNORMAL LOW (ref 1.13–1.30)
Calcium, Ion: 1.09 mmol/L — ABNORMAL LOW (ref 1.13–1.30)
Chloride: 103 mEq/L (ref 96–112)
Chloride: 101 mEq/L (ref 96–112)
Chloride: 104 mEq/L (ref 96–112)
Creatinine, Ser: 0.7 mg/dL (ref 0.50–1.35)
Creatinine, Ser: 0.9 mg/dL (ref 0.50–1.35)
Creatinine, Ser: 0.7 mg/dL (ref 0.50–1.35)
GLUCOSE: 142 mg/dL — AB (ref 70–99)
GLUCOSE: 173 mg/dL — AB (ref 70–99)
GLUCOSE: 124 mg/dL — AB (ref 70–99)
Glucose, Bld: 120 mg/dL — ABNORMAL HIGH (ref 70–99)
Glucose, Bld: 102 mg/dL — ABNORMAL HIGH (ref 70–99)
Glucose, Bld: 125 mg/dL — ABNORMAL HIGH (ref 70–99)
HCT: 38 % — ABNORMAL LOW (ref 39.0–52.0)
HCT: 26 % — ABNORMAL LOW (ref 39.0–52.0)
HCT: 34 % — ABNORMAL LOW (ref 39.0–52.0)
HCT: 26 % — ABNORMAL LOW (ref 39.0–52.0)
HCT: 25 % — ABNORMAL LOW (ref 39.0–52.0)
HEMATOCRIT: 27 % — AB (ref 39.0–52.0)
HEMOGLOBIN: 8.8 g/dL — AB (ref 13.0–17.0)
HEMOGLOBIN: 9.2 g/dL — AB (ref 13.0–17.0)
HEMOGLOBIN: 8.5 g/dL — AB (ref 13.0–17.0)
Hemoglobin: 12.9 g/dL — ABNORMAL LOW (ref 13.0–17.0)
Hemoglobin: 11.6 g/dL — ABNORMAL LOW (ref 13.0–17.0)
Hemoglobin: 8.8 g/dL — ABNORMAL LOW (ref 13.0–17.0)
Potassium: 3.9 mEq/L (ref 3.7–5.3)
Potassium: 4.1 mEq/L (ref 3.7–5.3)
Potassium: 4.1 mEq/L (ref 3.7–5.3)
Potassium: 4.9 mEq/L (ref 3.7–5.3)
Potassium: 4.4 mEq/L (ref 3.7–5.3)
Potassium: 4.4 mEq/L (ref 3.7–5.3)
SODIUM: 136 meq/L — AB (ref 137–147)
SODIUM: 139 meq/L (ref 137–147)
Sodium: 137 mEq/L (ref 137–147)
Sodium: 130 mEq/L — ABNORMAL LOW (ref 137–147)
Sodium: 135 mEq/L — ABNORMAL LOW (ref 137–147)
Sodium: 138 mEq/L (ref 137–147)
TCO2: 23 mmol/L (ref 0–100)
TCO2: 22 mmol/L (ref 0–100)
TCO2: 25 mmol/L (ref 0–100)
TCO2: 23 mmol/L (ref 0–100)
TCO2: 22 mmol/L (ref 0–100)
TCO2: 22 mmol/L (ref 0–100)

## 2014-02-13 LAB — POCT I-STAT 3, ART BLOOD GAS (G3+)
ACID-BASE DEFICIT: 1 mmol/L (ref 0.0–2.0)
ACID-BASE DEFICIT: 1 mmol/L (ref 0.0–2.0)
Acid-Base Excess: 2 mmol/L (ref 0.0–2.0)
Acid-base deficit: 1 mmol/L (ref 0.0–2.0)
Acid-base deficit: 7 mmol/L — ABNORMAL HIGH (ref 0.0–2.0)
BICARBONATE: 26.7 meq/L — AB (ref 20.0–24.0)
BICARBONATE: 23.9 meq/L (ref 20.0–24.0)
BICARBONATE: 24.4 meq/L — AB (ref 20.0–24.0)
Bicarbonate: 24.7 mEq/L — ABNORMAL HIGH (ref 20.0–24.0)
Bicarbonate: 24.7 mEq/L — ABNORMAL HIGH (ref 20.0–24.0)
Bicarbonate: 19.7 mEq/L — ABNORMAL LOW (ref 20.0–24.0)
O2 SAT: 100 %
O2 SAT: 98 %
O2 SAT: 98 %
O2 Saturation: 100 %
O2 Saturation: 100 %
O2 Saturation: 93 %
PCO2 ART: 41 mmHg (ref 35.0–45.0)
PCO2 ART: 45.4 mmHg — AB (ref 35.0–45.0)
PH ART: 7.388 (ref 7.350–7.450)
PH ART: 7.25 — AB (ref 7.350–7.450)
PO2 ART: 391 mmHg — AB (ref 80.0–100.0)
PO2 ART: 122 mmHg — AB (ref 80.0–100.0)
Patient temperature: 37.6
Patient temperature: 39
TCO2: 26 mmol/L (ref 0–100)
TCO2: 28 mmol/L (ref 0–100)
TCO2: 25 mmol/L (ref 0–100)
TCO2: 26 mmol/L (ref 0–100)
TCO2: 21 mmol/L (ref 0–100)
TCO2: 26 mmol/L (ref 0–100)
pCO2 arterial: 40.5 mmHg (ref 35.0–45.0)
pCO2 arterial: 40.5 mmHg (ref 35.0–45.0)
pCO2 arterial: 44 mmHg (ref 35.0–45.0)
pCO2 arterial: 45.3 mmHg — ABNORMAL HIGH (ref 35.0–45.0)
pH, Arterial: 7.428 (ref 7.350–7.450)
pH, Arterial: 7.38 (ref 7.350–7.450)
pH, Arterial: 7.357 (ref 7.350–7.450)
pH, Arterial: 7.347 — ABNORMAL LOW (ref 7.350–7.450)
pO2, Arterial: 463 mmHg — ABNORMAL HIGH (ref 80.0–100.0)
pO2, Arterial: 231 mmHg — ABNORMAL HIGH (ref 80.0–100.0)
pO2, Arterial: 113 mmHg — ABNORMAL HIGH (ref 80.0–100.0)
pO2, Arterial: 80 mmHg (ref 80.0–100.0)

## 2014-02-13 LAB — POCT I-STAT 4, (NA,K, GLUC, HGB,HCT)
GLUCOSE: 109 mg/dL — AB (ref 70–99)
HCT: 31 % — ABNORMAL LOW (ref 39.0–52.0)
HEMOGLOBIN: 10.5 g/dL — AB (ref 13.0–17.0)
POTASSIUM: 3.7 meq/L (ref 3.7–5.3)
SODIUM: 139 meq/L (ref 137–147)

## 2014-02-13 LAB — CBC
HCT: 27.7 % — ABNORMAL LOW (ref 39.0–52.0)
HEMATOCRIT: 41.4 % (ref 39.0–52.0)
HEMATOCRIT: 32.8 % — AB (ref 39.0–52.0)
HEMOGLOBIN: 9.4 g/dL — AB (ref 13.0–17.0)
Hemoglobin: 13.6 g/dL (ref 13.0–17.0)
Hemoglobin: 11 g/dL — ABNORMAL LOW (ref 13.0–17.0)
MCH: 28.5 pg (ref 26.0–34.0)
MCH: 28.6 pg (ref 26.0–34.0)
MCH: 28.3 pg (ref 26.0–34.0)
MCHC: 32.9 g/dL (ref 30.0–36.0)
MCHC: 33.5 g/dL (ref 30.0–36.0)
MCHC: 33.9 g/dL (ref 30.0–36.0)
MCV: 86.6 fL (ref 78.0–100.0)
MCV: 85.2 fL (ref 78.0–100.0)
MCV: 83.4 fL (ref 78.0–100.0)
Platelets: 247 10*3/uL (ref 150–400)
Platelets: 170 10*3/uL (ref 150–400)
Platelets: 184 10*3/uL (ref 150–400)
RBC: 4.78 MIL/uL (ref 4.22–5.81)
RBC: 3.85 MIL/uL — ABNORMAL LOW (ref 4.22–5.81)
RBC: 3.32 MIL/uL — AB (ref 4.22–5.81)
RDW: 13.8 % (ref 11.5–15.5)
RDW: 13.6 % (ref 11.5–15.5)
RDW: 13.6 % (ref 11.5–15.5)
WBC: 10.1 10*3/uL (ref 4.0–10.5)
WBC: 18.5 10*3/uL — ABNORMAL HIGH (ref 4.0–10.5)
WBC: 17.6 10*3/uL — ABNORMAL HIGH (ref 4.0–10.5)

## 2014-02-13 LAB — HEMOGLOBIN AND HEMATOCRIT, BLOOD
HCT: 28.9 % — ABNORMAL LOW (ref 39.0–52.0)
Hemoglobin: 9.8 g/dL — ABNORMAL LOW (ref 13.0–17.0)

## 2014-02-13 LAB — BASIC METABOLIC PANEL
Anion gap: 11 (ref 5–15)
BUN: 20 mg/dL (ref 6–23)
CHLORIDE: 103 meq/L (ref 96–112)
CO2: 25 mEq/L (ref 19–32)
CREATININE: 0.99 mg/dL (ref 0.50–1.35)
Calcium: 9 mg/dL (ref 8.4–10.5)
GFR calc Af Amer: >90 mL/min (ref >90)
GFR calc non Af Amer: 85 mL/min — ABNORMAL LOW (ref >90)
Glucose, Bld: 105 mg/dL — ABNORMAL HIGH (ref 70–99)
Potassium: 3.7 mEq/L (ref 3.7–5.3)
Sodium: 139 mEq/L (ref 137–147)

## 2014-02-13 LAB — PLATELET COUNT: PLATELETS: 191 10*3/uL (ref 150–400)

## 2014-02-13 LAB — MAGNESIUM: Magnesium: 3.1 mg/dL — ABNORMAL HIGH (ref 1.5–2.5)

## 2014-02-13 LAB — PROTIME-INR
INR: 1.32 (ref 0.00–1.49)
Prothrombin Time: 16.4 seconds — ABNORMAL HIGH (ref 11.6–15.2)

## 2014-02-13 LAB — HEMOGLOBIN A1C
HEMOGLOBIN A1C: 6.1 % — AB (ref <5.7)
MEAN PLASMA GLUCOSE: 128 mg/dL — AB (ref <117)

## 2014-02-13 LAB — URINALYSIS, ROUTINE W REFLEX MICROSCOPIC
Bilirubin Urine: NEGATIVE
Glucose, UA: NEGATIVE mg/dL
Hgb urine dipstick: NEGATIVE
Ketones, ur: NEGATIVE mg/dL
Leukocytes, UA: NEGATIVE
Nitrite: NEGATIVE
Protein, ur: NEGATIVE mg/dL
SPECIFIC GRAVITY, URINE: 1.029 (ref 1.005–1.030)
UROBILINOGEN UA: 1 mg/dL (ref 0.0–1.0)
pH: 6 (ref 5.0–8.0)

## 2014-02-13 LAB — POCT I-STAT GLUCOSE
Glucose, Bld: 132 mg/dL — ABNORMAL HIGH (ref 70–99)
Operator id: 284731

## 2014-02-13 LAB — CREATININE, SERUM
Creatinine, Ser: 0.81 mg/dL (ref 0.50–1.35)
GFR calc non Af Amer: >90 mL/min (ref >90)

## 2014-02-13 LAB — APTT: aPTT: 39 seconds — ABNORMAL HIGH (ref 24–37)

## 2014-02-13 SURGERY — CORONARY ARTERY BYPASS GRAFTING (CABG)
Anesthesia: General | Site: Chest

## 2014-02-13 MED ORDER — FENTANYL CITRATE 0.05 MG/ML IJ SOLN
INTRAMUSCULAR | Status: AC
  Filled 2014-02-13: qty 5

## 2014-02-13 MED ORDER — LACTATED RINGERS IV SOLN: INTRAVENOUS | Status: DC

## 2014-02-13 MED ORDER — SODIUM CHLORIDE 0.9 % IJ SOLN
3.0000 mL | Freq: Two times a day (BID) | INTRAMUSCULAR | Status: DC
  Administered 2014-02-14 – 2014-02-17 (×7): 3 mL via INTRAVENOUS

## 2014-02-13 MED ORDER — SODIUM CHLORIDE 0.9 % IJ SOLN
OROMUCOSAL | Status: DC | PRN
  Administered 2014-02-13: 13:00:00 via TOPICAL

## 2014-02-13 MED ORDER — SODIUM CHLORIDE 0.9 % IJ SOLN
INTRAMUSCULAR | Status: AC
  Filled 2014-02-13: qty 10

## 2014-02-13 MED ORDER — ALBUMIN HUMAN 5 % IV SOLN
INTRAVENOUS | Status: DC | PRN
  Administered 2014-02-13: 13:00:00 via INTRAVENOUS

## 2014-02-13 MED ORDER — SODIUM CHLORIDE 0.9 % IV SOLN
INTRAVENOUS | Status: DC | PRN
  Administered 2014-02-13: 12:00:00 via INTRAVENOUS

## 2014-02-13 MED ORDER — METHYLPREDNISOLONE SODIUM SUCC 125 MG IJ SOLR
60.0000 mg | Freq: Once | INTRAMUSCULAR | Status: AC
  Administered 2014-02-13: 60 mg via INTRAVENOUS
  Filled 2014-02-13: qty 2
  Filled 2014-02-13: qty 0.96

## 2014-02-13 MED ORDER — ROCURONIUM BROMIDE 50 MG/5ML IV SOLN
INTRAVENOUS | Status: AC
  Filled 2014-02-13: qty 2

## 2014-02-13 MED ORDER — PROTAMINE SULFATE 10 MG/ML IV SOLN
INTRAVENOUS | Status: DC | PRN
  Administered 2014-02-13: 25 mg via INTRAVENOUS
  Administered 2014-02-13 (×3): 50 mg via INTRAVENOUS
  Administered 2014-02-13: 25 mg via INTRAVENOUS

## 2014-02-13 MED ORDER — SODIUM CHLORIDE 0.45 % IV SOLN
INTRAVENOUS | Status: DC
Start: 2014-02-13 — End: 2014-02-14
  Administered 2014-02-13: 10 mL/h via INTRAVENOUS

## 2014-02-13 MED ORDER — PHENYLEPHRINE 40 MCG/ML (10ML) SYRINGE FOR IV PUSH (FOR BLOOD PRESSURE SUPPORT)
PREFILLED_SYRINGE | INTRAVENOUS | Status: AC
  Filled 2014-02-13: qty 10

## 2014-02-13 MED ORDER — SUCCINYLCHOLINE CHLORIDE 20 MG/ML IJ SOLN
INTRAMUSCULAR | Status: AC
  Filled 2014-02-13: qty 1

## 2014-02-13 MED ORDER — EPHEDRINE SULFATE 50 MG/ML IJ SOLN
INTRAMUSCULAR | Status: AC
  Filled 2014-02-13: qty 1

## 2014-02-13 MED ORDER — SODIUM CHLORIDE 0.9 % IJ SOLN
OROMUCOSAL | Status: DC | PRN
  Administered 2014-02-13: 10:00:00 via TOPICAL

## 2014-02-13 MED ORDER — SODIUM CHLORIDE 0.9 % IJ SOLN: 3.0000 mL | INTRAMUSCULAR | Status: DC | PRN

## 2014-02-13 MED ORDER — ROCURONIUM BROMIDE 50 MG/5ML IV SOLN
INTRAVENOUS | Status: AC
  Filled 2014-02-13: qty 1

## 2014-02-13 MED ORDER — ACETAMINOPHEN 650 MG RE SUPP: 650.0000 mg | Freq: Once | RECTAL | Status: AC

## 2014-02-13 MED ORDER — ALBUMIN HUMAN 5 % IV SOLN
250.0000 mL | INTRAVENOUS | Status: AC | PRN
  Administered 2014-02-13 – 2014-02-14 (×4): 250 mL via INTRAVENOUS
  Filled 2014-02-13 (×2): qty 250

## 2014-02-13 MED ORDER — ONDANSETRON HCL 4 MG/2ML IJ SOLN
4.0000 mg | Freq: Four times a day (QID) | INTRAMUSCULAR | Status: DC | PRN
  Administered 2014-02-14: 4 mg via INTRAVENOUS
  Filled 2014-02-13: qty 2

## 2014-02-13 MED ORDER — MIDAZOLAM HCL 2 MG/2ML IJ SOLN
INTRAMUSCULAR | Status: AC
  Filled 2014-02-13: qty 2

## 2014-02-13 MED ORDER — PROTAMINE SULFATE 10 MG/ML IV SOLN
INTRAVENOUS | Status: AC
  Filled 2014-02-13: qty 25

## 2014-02-13 MED ORDER — MAGNESIUM SULFATE 4000MG/100ML IJ SOLN
4.0000 g | Freq: Once | INTRAMUSCULAR | Status: AC
  Administered 2014-02-13: 4 g via INTRAVENOUS
  Filled 2014-02-13: qty 100

## 2014-02-13 MED ORDER — POTASSIUM CHLORIDE 10 MEQ/50ML IV SOLN
10.0000 meq | INTRAVENOUS | Status: AC
  Administered 2014-02-13 (×3): 10 meq via INTRAVENOUS

## 2014-02-13 MED ORDER — PROPOFOL 10 MG/ML IV BOLUS
INTRAVENOUS | Status: AC
  Filled 2014-02-13: qty 20

## 2014-02-13 MED ORDER — FENTANYL CITRATE 0.05 MG/ML IJ SOLN
INTRAMUSCULAR | Status: DC | PRN
  Administered 2014-02-13 (×3): 250 ug via INTRAVENOUS
  Administered 2014-02-13: 450 ug via INTRAVENOUS
  Administered 2014-02-13 (×2): 250 ug via INTRAVENOUS
  Administered 2014-02-13: 50 ug via INTRAVENOUS

## 2014-02-13 MED ORDER — PANTOPRAZOLE SODIUM 40 MG PO TBEC
40.0000 mg | DELAYED_RELEASE_TABLET | Freq: Every day | ORAL | Status: DC
Start: 2014-02-15 — End: 2014-02-18
  Administered 2014-02-15 – 2014-02-18 (×4): 40 mg via ORAL
  Filled 2014-02-13 (×2): qty 1

## 2014-02-13 MED ORDER — INSULIN REGULAR BOLUS VIA INFUSION
0.0000 [IU] | Freq: Three times a day (TID) | INTRAVENOUS | Status: DC
  Filled 2014-02-13: qty 10

## 2014-02-13 MED ORDER — PROPOFOL 10 MG/ML IV BOLUS
INTRAVENOUS | Status: DC | PRN
  Administered 2014-02-13: 50 mg via INTRAVENOUS

## 2014-02-13 MED ORDER — MIDAZOLAM HCL 10 MG/2ML IJ SOLN
INTRAMUSCULAR | Status: AC
  Filled 2014-02-13: qty 2

## 2014-02-13 MED ORDER — ACETAMINOPHEN 160 MG/5ML PO SOLN: 650.0000 mg | Freq: Once | ORAL | Status: AC

## 2014-02-13 MED ORDER — SODIUM BICARBONATE 8.4 % IV SOLN
50.0000 meq | Freq: Once | INTRAVENOUS | Status: AC
  Administered 2014-02-13: 50 meq via INTRAVENOUS
  Filled 2014-02-13: qty 50

## 2014-02-13 MED ORDER — ARTIFICIAL TEARS OP OINT
TOPICAL_OINTMENT | OPHTHALMIC | Status: DC | PRN
  Administered 2014-02-13: 1 via OPHTHALMIC

## 2014-02-13 MED ORDER — DOCUSATE SODIUM 100 MG PO CAPS
200.0000 mg | ORAL_CAPSULE | Freq: Every day | ORAL | Status: DC
  Administered 2014-02-14 – 2014-02-18 (×4): 200 mg via ORAL
  Filled 2014-02-13 (×5): qty 2

## 2014-02-13 MED ORDER — VANCOMYCIN HCL IN DEXTROSE 1-5 GM/200ML-% IV SOLN
1000.0000 mg | Freq: Once | INTRAVENOUS | Status: AC
  Administered 2014-02-13: 1000 mg via INTRAVENOUS
  Filled 2014-02-13: qty 200

## 2014-02-13 MED ORDER — MIDAZOLAM HCL 2 MG/2ML IJ SOLN: 2.0000 mg | INTRAMUSCULAR | Status: DC | PRN

## 2014-02-13 MED ORDER — FAMOTIDINE IN NACL 20-0.9 MG/50ML-% IV SOLN
20.0000 mg | Freq: Two times a day (BID) | INTRAVENOUS | Status: AC
  Administered 2014-02-13: 20 mg via INTRAVENOUS

## 2014-02-13 MED ORDER — SODIUM CHLORIDE 0.9 % IV SOLN
INTRAVENOUS | Status: DC
  Administered 2014-02-13: 20 mL/h via INTRAVENOUS

## 2014-02-13 MED ORDER — ASPIRIN 81 MG PO CHEW: 324.0000 mg | CHEWABLE_TABLET | Freq: Every day | ORAL | Status: DC

## 2014-02-13 MED ORDER — ROCURONIUM BROMIDE 100 MG/10ML IV SOLN
INTRAVENOUS | Status: DC | PRN
  Administered 2014-02-13: 90 mg via INTRAVENOUS
  Administered 2014-02-13: 50 mg via INTRAVENOUS
  Administered 2014-02-13: 30 mg via INTRAVENOUS
  Administered 2014-02-13: 60 mg via INTRAVENOUS

## 2014-02-13 MED ORDER — ACETAMINOPHEN 500 MG PO TABS
1000.0000 mg | ORAL_TABLET | Freq: Four times a day (QID) | ORAL | Status: DC
  Administered 2014-02-13 – 2014-02-18 (×16): 1000 mg via ORAL
  Filled 2014-02-13 (×20): qty 2

## 2014-02-13 MED ORDER — CHLORHEXIDINE GLUCONATE 0.12 % MT SOLN: 15.0000 mL | Freq: Two times a day (BID) | OROMUCOSAL | Status: DC

## 2014-02-13 MED ORDER — INSULIN REGULAR HUMAN 100 UNIT/ML IJ SOLN
INTRAMUSCULAR | Status: DC
  Administered 2014-02-13: 1.6 [IU]/h via INTRAVENOUS
  Filled 2014-02-13 (×2): qty 2.5

## 2014-02-13 MED ORDER — METOPROLOL TARTRATE 12.5 MG HALF TABLET
12.5000 mg | ORAL_TABLET | Freq: Two times a day (BID) | ORAL | Status: DC
  Administered 2014-02-14 – 2014-02-15 (×2): 12.5 mg via ORAL
  Filled 2014-02-13 (×7): qty 1

## 2014-02-13 MED ORDER — METOPROLOL TARTRATE 25 MG/10 ML ORAL SUSPENSION
12.5000 mg | Freq: Two times a day (BID) | ORAL | Status: DC
  Filled 2014-02-13 (×3): qty 5

## 2014-02-13 MED ORDER — METOPROLOL TARTRATE 1 MG/ML IV SOLN
2.5000 mg | INTRAVENOUS | Status: DC | PRN
Start: 2014-02-13 — End: 2014-02-18

## 2014-02-13 MED ORDER — HEPARIN SODIUM (PORCINE) 1000 UNIT/ML IJ SOLN
INTRAMUSCULAR | Status: AC
Start: 2014-02-13 — End: 2014-02-13
  Filled 2014-02-13: qty 1

## 2014-02-13 MED ORDER — BISACODYL 10 MG RE SUPP: 10.0000 mg | Freq: Every day | RECTAL | Status: DC

## 2014-02-13 MED ORDER — BISACODYL 5 MG PO TBEC
10.0000 mg | DELAYED_RELEASE_TABLET | Freq: Every day | ORAL | Status: DC
  Administered 2014-02-14 – 2014-02-16 (×3): 10 mg via ORAL
  Filled 2014-02-13 (×4): qty 2

## 2014-02-13 MED ORDER — HEPARIN SODIUM (PORCINE) 1000 UNIT/ML IJ SOLN
INTRAMUSCULAR | Status: DC | PRN
  Administered 2014-02-13: 20000 [IU] via INTRAVENOUS

## 2014-02-13 MED ORDER — MORPHINE SULFATE 2 MG/ML IJ SOLN
1.0000 mg | INTRAMUSCULAR | Status: AC | PRN
  Administered 2014-02-13: 1 mg via INTRAVENOUS
  Administered 2014-02-13: 2 mg via INTRAVENOUS

## 2014-02-13 MED ORDER — ACETAMINOPHEN 160 MG/5ML PO SOLN: 1000.0000 mg | Freq: Four times a day (QID) | ORAL | Status: DC

## 2014-02-13 MED ORDER — PHENYLEPHRINE HCL 10 MG/ML IJ SOLN
0.0000 ug/min | INTRAVENOUS | Status: DC
  Administered 2014-02-13: 40 ug/min via INTRAVENOUS
  Filled 2014-02-13 (×2): qty 2

## 2014-02-13 MED ORDER — 0.9 % SODIUM CHLORIDE (POUR BTL) OPTIME
TOPICAL | Status: DC | PRN
  Administered 2014-02-13: 1000 mL

## 2014-02-13 MED ORDER — NITROGLYCERIN IN D5W 200-5 MCG/ML-% IV SOLN: 0.0000 ug/min | INTRAVENOUS | Status: DC

## 2014-02-13 MED ORDER — DEXMEDETOMIDINE HCL IN NACL 200 MCG/50ML IV SOLN
0.1000 ug/kg/h | INTRAVENOUS | Status: DC
Start: 2014-02-13 — End: 2014-02-14

## 2014-02-13 MED ORDER — DEXTROSE 5 % IV SOLN
1.5000 g | Freq: Two times a day (BID) | INTRAVENOUS | Status: AC
  Administered 2014-02-13 – 2014-02-15 (×4): 1.5 g via INTRAVENOUS
  Filled 2014-02-13 (×4): qty 1.5

## 2014-02-13 MED ORDER — SODIUM CHLORIDE 0.9 % IV SOLN
INTRAVENOUS | Status: DC
  Administered 2014-02-13: 100 mL/h via INTRAVENOUS

## 2014-02-13 MED ORDER — ARTIFICIAL TEARS OP OINT
TOPICAL_OINTMENT | OPHTHALMIC | Status: AC
Start: 2014-02-13 — End: 2014-02-13
  Filled 2014-02-13: qty 3.5

## 2014-02-13 MED ORDER — OXYCODONE HCL 5 MG PO TABS
5.0000 mg | ORAL_TABLET | ORAL | Status: DC | PRN
  Administered 2014-02-13: 5 mg via ORAL
  Administered 2014-02-14 – 2014-02-17 (×9): 10 mg via ORAL
  Filled 2014-02-13 (×3): qty 2
  Filled 2014-02-13: qty 1
  Filled 2014-02-13 (×6): qty 2

## 2014-02-13 MED ORDER — LACTATED RINGERS IV SOLN
INTRAVENOUS | Status: DC | PRN
  Administered 2014-02-13 (×5): via INTRAVENOUS

## 2014-02-13 MED ORDER — MORPHINE SULFATE 2 MG/ML IJ SOLN
2.0000 mg | INTRAMUSCULAR | Status: DC | PRN
  Administered 2014-02-13 – 2014-02-14 (×3): 2 mg via INTRAVENOUS
  Filled 2014-02-13 (×5): qty 1

## 2014-02-13 MED ORDER — ACETAMINOPHEN 650 MG RE SUPP
650.0000 mg | Freq: Once | RECTAL | Status: AC
  Administered 2014-02-13: 650 mg via RECTAL

## 2014-02-13 MED ORDER — HEPARIN SODIUM (PORCINE) 1000 UNIT/ML IJ SOLN
INTRAMUSCULAR | Status: AC
  Filled 2014-02-13: qty 1

## 2014-02-13 MED ORDER — CETYLPYRIDINIUM CHLORIDE 0.05 % MT LIQD: 7.0000 mL | Freq: Four times a day (QID) | OROMUCOSAL | Status: DC

## 2014-02-13 MED ORDER — MIDAZOLAM HCL 5 MG/5ML IJ SOLN
INTRAMUSCULAR | Status: DC | PRN
  Administered 2014-02-13: 3 mg via INTRAVENOUS
  Administered 2014-02-13: 2 mg via INTRAVENOUS
  Administered 2014-02-13 (×2): 3 mg via INTRAVENOUS
  Administered 2014-02-13: 1 mg via INTRAVENOUS

## 2014-02-13 MED ORDER — SODIUM CHLORIDE 0.9 % IV SOLN: 250.0000 mL | INTRAVENOUS | Status: DC

## 2014-02-13 MED ORDER — ASPIRIN EC 325 MG PO TBEC
325.0000 mg | DELAYED_RELEASE_TABLET | Freq: Every day | ORAL | Status: DC
Start: 2014-02-14 — End: 2014-02-15
  Administered 2014-02-14: 325 mg via ORAL
  Filled 2014-02-13 (×2): qty 1

## 2014-02-13 MED ORDER — CETYLPYRIDINIUM CHLORIDE 0.05 % MT LIQD
7.0000 mL | Freq: Two times a day (BID) | OROMUCOSAL | Status: DC
  Administered 2014-02-13 – 2014-02-14 (×2): 7 mL via OROMUCOSAL

## 2014-02-13 MED ORDER — LACTATED RINGERS IV SOLN: 500.0000 mL | Freq: Once | INTRAVENOUS | Status: AC | PRN

## 2014-02-13 MED FILL — Sodium Chloride IV Soln 0.9%: INTRAVENOUS | Qty: 3000 | Status: AC

## 2014-02-13 MED FILL — Lidocaine HCl IV Inj 20 MG/ML: INTRAVENOUS | Qty: 5 | Status: AC

## 2014-02-13 MED FILL — Heparin Sodium (Porcine) Inj 1000 Unit/ML: INTRAMUSCULAR | Qty: 10 | Status: AC

## 2014-02-13 MED FILL — Sodium Bicarbonate IV Soln 8.4%: INTRAVENOUS | Qty: 50 | Status: AC

## 2014-02-13 MED FILL — Electrolyte-R (PH 7.4) Solution: INTRAVENOUS | Qty: 4000 | Status: AC

## 2014-02-13 MED FILL — Mannitol IV Soln 20%: INTRAVENOUS | Qty: 500 | Status: AC

## 2014-02-13 SURGICAL SUPPLY — 135 items
ADAPTER CARDIO PERF ANTE/RETRO (ADAPTER) ×1 IMPLANT
ADPR PRFSN 84XANTGRD RTRGD (ADAPTER) ×2
APL SKNCLS STERI-STRIP NONHPOA (GAUZE/BANDAGES/DRESSINGS) ×2
APPLIER CLIP 9.375 MED OPEN (MISCELLANEOUS)
APPLIER CLIP 9.375 SM OPEN (CLIP)
APR CLP MED 9.3 20 MLT OPN (MISCELLANEOUS)
APR CLP SM 9.3 20 MLT OPN (CLIP)
ATTRACTOMAT 16X20 MAGNETIC DRP (DRAPES) ×3 IMPLANT
BAG DECANTER FOR FLEXI CONT (MISCELLANEOUS) ×4 IMPLANT
BANDAGE ELASTIC 4 VELCRO ST LF (GAUZE/BANDAGES/DRESSINGS) ×3 IMPLANT
BANDAGE ELASTIC 6 VELCRO ST LF (GAUZE/BANDAGES/DRESSINGS) ×3 IMPLANT
BASKET HEART (ORDER IN 25'S) (MISCELLANEOUS) ×1
BASKET HEART (ORDER IN 25S) (MISCELLANEOUS) ×2 IMPLANT
BENZOIN TINCTURE PRP APPL 2/3 (GAUZE/BANDAGES/DRESSINGS) ×3 IMPLANT
BLADE 11 SAFETY STRL DISP (BLADE) ×2 IMPLANT
BLADE STERNUM SYSTEM 6 (BLADE) ×3 IMPLANT
BLADE SURG ROTATE 9660 (MISCELLANEOUS) IMPLANT
BNDG GAUZE ELAST 4 BULKY (GAUZE/BANDAGES/DRESSINGS) ×3 IMPLANT
CANISTER SUCTION 2500CC (MISCELLANEOUS) ×3 IMPLANT
CANNULA EZ GLIDE AORTIC 21FR (CANNULA) ×6 IMPLANT
CANNULA GUNDRY RCSP 15FR (MISCELLANEOUS) IMPLANT
CANNULA VENOUS LOW PROF 34X46 (CANNULA) ×3 IMPLANT
CARDIAC SUCTION (MISCELLANEOUS) ×3 IMPLANT
CATH CPB KIT OWEN (MISCELLANEOUS) ×3 IMPLANT
CATH THORACIC 28FR (CATHETERS) IMPLANT
CATH THORACIC 28FR RT ANG (CATHETERS) IMPLANT
CATH THORACIC 36FR (CATHETERS) ×3 IMPLANT
CATH THORACIC 36FR RT ANG (CATHETERS) ×3 IMPLANT
CLIP APPLIE 9.375 MED OPEN (MISCELLANEOUS) IMPLANT
CLIP APPLIE 9.375 SM OPEN (CLIP) IMPLANT
CLIP FOGARTY SPRING 6M (CLIP) IMPLANT
CLIP RETRACTION 3.0MM CORONARY (MISCELLANEOUS) ×1 IMPLANT
CLIP TI MEDIUM 24 (CLIP) IMPLANT
CLIP TI WIDE RED SMALL 24 (CLIP) IMPLANT
CLOSURE STERI-STRIP 1/4X4 (GAUZE/BANDAGES/DRESSINGS) ×1 IMPLANT
CONN 3/8X1/2 ST GISH (MISCELLANEOUS) ×2 IMPLANT
CONN ST 1/4X3/8  BEN (MISCELLANEOUS) ×1
CONN ST 1/4X3/8 BEN (MISCELLANEOUS) IMPLANT
CONN Y 3/8X3/8X3/8  BEN (MISCELLANEOUS)
CONN Y 3/8X3/8X3/8 BEN (MISCELLANEOUS) IMPLANT
COVER SURGICAL LIGHT HANDLE (MISCELLANEOUS) ×3 IMPLANT
CRADLE DONUT ADULT HEAD (MISCELLANEOUS) ×3 IMPLANT
DRAIN CHANNEL 32F RND 10.7 FF (WOUND CARE) ×3 IMPLANT
DRAPE CARDIOVASCULAR INCISE (DRAPES) ×3
DRAPE INCISE IOBAN 66X45 STRL (DRAPES) ×3 IMPLANT
DRAPE SLUSH/WARMER DISC (DRAPES) ×3 IMPLANT
DRAPE SRG 135X102X78XABS (DRAPES) ×2 IMPLANT
DRSG COVADERM 4X14 (GAUZE/BANDAGES/DRESSINGS) ×3 IMPLANT
ELECT BLADE 4.0 EZ CLEAN MEGAD (MISCELLANEOUS) ×3
ELECT REM PT RETURN 9FT ADLT (ELECTROSURGICAL) ×6
ELECTRODE BLDE 4.0 EZ CLN MEGD (MISCELLANEOUS) IMPLANT
ELECTRODE REM PT RTRN 9FT ADLT (ELECTROSURGICAL) ×4 IMPLANT
GAUZE SPONGE 4X4 12PLY STRL (GAUZE/BANDAGES/DRESSINGS) ×6 IMPLANT
GLOVE BIO SURGEON STRL SZ 6 (GLOVE) IMPLANT
GLOVE BIO SURGEON STRL SZ 6.5 (GLOVE) IMPLANT
GLOVE BIO SURGEON STRL SZ7 (GLOVE) IMPLANT
GLOVE BIO SURGEON STRL SZ7.5 (GLOVE) IMPLANT
GLOVE BIOGEL PI IND STRL 6 (GLOVE) IMPLANT
GLOVE BIOGEL PI IND STRL 6.5 (GLOVE) IMPLANT
GLOVE BIOGEL PI IND STRL 7.0 (GLOVE) IMPLANT
GLOVE BIOGEL PI INDICATOR 6 (GLOVE)
GLOVE BIOGEL PI INDICATOR 6.5 (GLOVE)
GLOVE BIOGEL PI INDICATOR 7.0 (GLOVE)
GLOVE EUDERMIC 7 POWDERFREE (GLOVE) IMPLANT
GLOVE ORTHO TXT STRL SZ7.5 (GLOVE) ×6 IMPLANT
GOWN STRL REUS W/ TWL LRG LVL3 (GOWN DISPOSABLE) ×8 IMPLANT
GOWN STRL REUS W/TWL LRG LVL3 (GOWN DISPOSABLE) ×12
HEMOSTAT POWDER SURGIFOAM 1G (HEMOSTASIS) ×11 IMPLANT
INSERT FOGARTY 61MM (MISCELLANEOUS) IMPLANT
INSERT FOGARTY XLG (MISCELLANEOUS) ×3 IMPLANT
KIT BASIN OR (CUSTOM PROCEDURE TRAY) ×3 IMPLANT
KIT ROOM TURNOVER OR (KITS) ×3 IMPLANT
KIT SUCTION CATH 14FR (SUCTIONS) ×15 IMPLANT
KIT VASOVIEW W/TROCAR VH 2000 (KITS) ×3 IMPLANT
LEAD PACING MYOCARDI (MISCELLANEOUS) ×3 IMPLANT
MARKER GRAFT CORONARY BYPASS (MISCELLANEOUS) ×9 IMPLANT
NS IRRIG 1000ML POUR BTL (IV SOLUTION) ×16 IMPLANT
PACK OPEN HEART (CUSTOM PROCEDURE TRAY) ×3 IMPLANT
PAD ARMBOARD 7.5X6 YLW CONV (MISCELLANEOUS) ×3 IMPLANT
PAD ELECT DEFIB RADIOL ZOLL (MISCELLANEOUS) ×3 IMPLANT
PENCIL BUTTON HOLSTER BLD 10FT (ELECTRODE) ×3 IMPLANT
PUNCH AORTIC ROT 4.0MM RCL 40 (MISCELLANEOUS) ×1 IMPLANT
PUNCH AORTIC ROTATE 4.0MM (MISCELLANEOUS) IMPLANT
PUNCH AORTIC ROTATE 4.5MM 8IN (MISCELLANEOUS) IMPLANT
PUNCH AORTIC ROTATE 5MM 8IN (MISCELLANEOUS) IMPLANT
SET CARDIOPLEGIA MPS 5001102 (MISCELLANEOUS) ×1 IMPLANT
SOLUTION ANTI FOG 6CC (MISCELLANEOUS) IMPLANT
SPONGE GAUZE 4X4 12PLY STER LF (GAUZE/BANDAGES/DRESSINGS) ×2 IMPLANT
SPONGE LAP 18X18 X RAY DECT (DISPOSABLE) IMPLANT
SPONGE LAP 4X18 X RAY DECT (DISPOSABLE) ×1 IMPLANT
STRIP CLOSURE SKIN 1/2X4 (GAUZE/BANDAGES/DRESSINGS) ×1 IMPLANT
SUT BONE WAX W31G (SUTURE) ×3 IMPLANT
SUT ETHIBOND X763 2 0 SH 1 (SUTURE) ×7 IMPLANT
SUT MNCRL AB 3-0 PS2 18 (SUTURE) ×6 IMPLANT
SUT MNCRL AB 4-0 PS2 18 (SUTURE) ×1 IMPLANT
SUT PDS AB 1 CTX 36 (SUTURE) ×8 IMPLANT
SUT PROLENE 2 0 SH DA (SUTURE) IMPLANT
SUT PROLENE 3 0 SH DA (SUTURE) ×4 IMPLANT
SUT PROLENE 3 0 SH1 36 (SUTURE) IMPLANT
SUT PROLENE 4 0 RB 1 (SUTURE)
SUT PROLENE 4 0 SH DA (SUTURE) ×1 IMPLANT
SUT PROLENE 4-0 RB1 .5 CRCL 36 (SUTURE) IMPLANT
SUT PROLENE 5 0 C 1 36 (SUTURE) IMPLANT
SUT PROLENE 6 0 C 1 24 (SUTURE) ×1 IMPLANT
SUT PROLENE 6 0 C 1 30 (SUTURE) ×3 IMPLANT
SUT PROLENE 7 0 P 1 (SUTURE) ×6 IMPLANT
SUT PROLENE 7.0 RB 3 (SUTURE) ×9 IMPLANT
SUT PROLENE 8 0 BV175 6 (SUTURE) IMPLANT
SUT PROLENE BLUE 7 0 (SUTURE) ×3 IMPLANT
SUT PROLENE POLY MONO (SUTURE) ×2 IMPLANT
SUT SILK  1 MH (SUTURE) ×2
SUT SILK 1 MH (SUTURE) ×2 IMPLANT
SUT STEEL 6MS V (SUTURE) IMPLANT
SUT STEEL STERNAL CCS#1 18IN (SUTURE) IMPLANT
SUT STEEL SZ 6 DBL 3X14 BALL (SUTURE) IMPLANT
SUT VIC AB 1 CTX 27 (SUTURE) ×1 IMPLANT
SUT VIC AB 1 CTX 36 (SUTURE)
SUT VIC AB 1 CTX36XBRD ANBCTR (SUTURE) IMPLANT
SUT VIC AB 2-0 CT1 27 (SUTURE) ×3
SUT VIC AB 2-0 CT1 TAPERPNT 27 (SUTURE) IMPLANT
SUT VIC AB 2-0 CTX 27 (SUTURE) IMPLANT
SUT VIC AB 3-0 SH 27 (SUTURE)
SUT VIC AB 3-0 SH 27X BRD (SUTURE) IMPLANT
SUT VIC AB 3-0 X1 27 (SUTURE) IMPLANT
SUT VICRYL 4-0 PS2 18IN ABS (SUTURE) IMPLANT
SUTURE E-PAK OPEN HEART (SUTURE) ×3 IMPLANT
SYSTEM SAHARA CHEST DRAIN ATS (WOUND CARE) ×3 IMPLANT
TAPE CLOTH SURG 4X10 WHT LF (GAUZE/BANDAGES/DRESSINGS) ×2 IMPLANT
TOWEL OR 17X24 6PK STRL BLUE (TOWEL DISPOSABLE) ×6 IMPLANT
TOWEL OR 17X26 10 PK STRL BLUE (TOWEL DISPOSABLE) ×6 IMPLANT
TOWEL OR NON WOVEN STRL DISP B (DISPOSABLE) ×1 IMPLANT
TRAY FOLEY IC TEMP SENS 16FR (CATHETERS) ×3 IMPLANT
TUBING INSUFFLATION (TUBING) ×3 IMPLANT
UNDERPAD 30X30 INCONTINENT (UNDERPADS AND DIAPERS) ×3 IMPLANT
WATER STERILE IRR 1000ML POUR (IV SOLUTION) ×6 IMPLANT

## 2014-02-13 NOTE — Transfer of Care (Signed)
Immediate Anesthesia Transfer of Care Note  Patient: Melvin Dunn  Procedure(s) Performed: Procedure(s): CORONARY ARTERY BYPASS GRAFTING (CABG) TIMES FOUR USING LEFT INTERNAL MAMMARY ARTERY AND RIGHT SAPHENOUS LEG VEIN HARVESTED ENDOSCOPICALLY (N/A) INTRAOPERATIVE TRANSESOPHAGEAL ECHOCARDIOGRAM (N/A)  Patient Location: SICU  Anesthesia Type:General  Level of Consciousness: Patient remains intubated per anesthesia plan  Airway & Oxygen Therapy: Patient remains intubated per anesthesia plan and Patient placed on Ventilator (see vital sign flow sheet for setting)  Post-op Assessment: Report given to PACU RN  Post vital signs: Reviewed and stable  Complications: No apparent anesthesia complications

## 2014-02-13 NOTE — Op Note (Signed)
CARDIOTHORACIC SURGERY OPERATIVE NOTE  Date of Procedure: 02/13/2014  Preoperative Diagnosis:   Severe Left Main Disease  3-vessel Coronary Artery Disease  Stable Angina Pectoris  Postoperative Diagnosis: Same  Procedure:    Coronary Artery Bypass Grafting x 4   Left Internal Mammary Artery to Distal Left Anterior Descending Coronary Artery  Saphenous Vein Graft to First Obtuse Marginal Branch of Left Circumflex Coronary Artery  Saphenous Vein Graft to Distal Right Coronary Artery with  Sequential Sapheonous Vein Graft to Right Posterolateral Branch Coronary Artery  Endoscopic Vein Harvest from Right Thigh and Lower Leg  Surgeon: Valentina Gu. Roxy Manns, MD  Assistant: Suzzanne Cloud, PA-C  Anesthesia: Duane Boston, MD  Operative Findings: Tricuspid aortic valve with thin, normally moving leaflets (all 3)  Mild aortic stenosis caused by mild sclerosis at all 3 commissures Normal LV systolic function  Small caliber but otherwise good quality LIMA conduit for grafting  Good quality SVG conduit for grafting  Relatively small coronary arteries with diffuse CAD     BRIEF CLINICAL NOTE AND INDICATIONS FOR SURGERY  Patient is a 63 year old white male from [place] with no previous history of coronary artery disease but risk factors notable for history of hyperlipidemia and borderline type 2 diabetes mellitus. The patient states that he first began to experience exertional substernal chest discomfort approximately 6-8 months ago. The pain is described as a tightness or squeezing pain radiating across his left chest which only develops in association with relatively strenuous physical exertion. Discomfort can be associated with shortness breath and is always promptly relieved by rest. Over the past 6-8 months symptoms have progressed such that now occur more frequently and with less strenuous activity. However, the patient denies any history of chest discomfort occurring at rest or  waking him from his sleep. He he has still been able to tend to most of his daily activities as long as he is careful to avoid more strenuous exertion. He reports these symptoms to his primary care physician who promptly referred the patient for cardiology consultation. The patient was seen by cost Dr. Radford Pax and noted to have a systolic murmur on physical exam. The patient underwent transthoracic echocardiogram and nuclear stress test yesterday. Transthoracic echocardiogram demonstrated normal left ventricular systolic function and mild aortic stenosis with mean transvalvular gradient measured 17 mmHg across the aortic valve. Nuclear stress test was markedly abnormal and considered high-risk with moderate size area of reversible ischemia demonstrated in the inferorapical wall and marked ST segment depression in the inferolateral leads with stress. The patient was promptly scheduled for diagnostic cardiac catheterization which was performed earlier today by Dr. Burt Knack. The patient was found to have critical left main coronary artery stenosis with severe three-vessel coronary artery disease. Left ventricular systolic function was normal. The patient was referred for surgical consultation.  The patient has been seen in consultation and counseled at length regarding the indications, risks and potential benefits of surgery.  All questions have been answered, and the patient provides full informed consent for the operation as described.     DETAILS OF THE OPERATIVE PROCEDURE  Preparation:  The patient is brought to the operating room on the above mentioned date and central monitoring was established by the anesthesia team including placement of Swan-Ganz catheter and radial arterial line. The patient is placed in the supine position on the operating table.  Intravenous antibiotics are administered. General endotracheal anesthesia is induced uneventfully. A Foley catheter is placed.  Baseline transesophageal  echocardiogram was performed.  Findings  were notable for normal left ventricular size and systolic function. There was mild aortic stenosis caused by sclerosis involving all 3 commissures of the aortic valve. The aortic valve was tricuspid and all 3 leaflets appeared thin with normal leaflet mobility. There was no aortic insufficiency. No other significant abnormalities were noted.  The patient's chest, abdomen, both groins, and both lower extremities are prepared and draped in a sterile manner. A time out procedure is performed.   Surgical Approach and Conduit Harvest:  A median sternotomy incision was performed and the left internal mammary artery is dissected from the chest wall and prepared for bypass grafting. The left internal mammary artery is notably slightly small caliber but otherwise good quality conduit. Simultaneously, saphenous vein is obtained from the patient's right thigh using endoscopic vein harvest technique. The saphenous vein is notably good quality conduit. After removal of the saphenous vein, the small surgical incisions in the lower extremity are closed with absorbable suture. Following systemic heparinization, the left internal mammary artery was transected distally noted to have excellent flow.   Extracorporeal Cardiopulmonary Bypass and Myocardial Protection:  The pericardium is opened. The ascending aorta is normal in appearance. The ascending aorta and the right atrium are cannulated for cardioplegia bypass.  Adequate heparinization is verified.  The entire pre-bypass portion of the operation was notable for stable hemodynamics.  Cardiopulmonary bypass was begun and the surface of the heart is inspected. Distal target vessels are selected for coronary artery bypass grafting. A cardioplegia cannula is placed in the ascending aorta.  A temperature probe was placed in the interventricular septum.  The patient is allowed to cool passively to [facility] systemic temperature.  The  aortic cross clamp is applied and cold blood cardioplegia is delivered initially in an antegrade fashion through the aortic root. .  Iced saline slush is applied for topical hypothermia.  The initial cardioplegic arrest is rapid with early diastolic arrest.  Repeat doses of cardioplegia are administered intermittently throughout the entire cross clamp portion of the operation through the aortic root and through subsequently placed vein grafts in order to maintain completely flat electrocardiogram and septal myocardial temperature below 15C.  Myocardial protection was felt to be excellent.  Coronary Artery Bypass Grafting:   The first obtuse marginal branch of the left circumflex coronary artery was grafted using a reversed saphenous vein graft in an end-to-side fashion.  At the site of distal anastomosis the target vessel was good quality and measured approximately 1.5 mm in diameter.  The distal right coronary artery was grafted using a reversed saphenous vein graft in an side-to-side fashion.  At the site of distal anastomosis the target vessel was fair quality and measured approximately 1.8 mm in diameter.  The posterolateral branch of the distal right coronary artery was grafted in and end-to-side fashion using a sequential saphenous vein graft off of the distal segment of the vein graft placed to the right coronary artery.  At the site of distal anastomosis the target vessel was fair quality and measured approximately 1.3 mm in diameter.  The distal left anterior coronary artery was grafted with the left internal mammary artery in an end-to-side fashion.  At the site of distal anastomosis the target vessel was fair quality and measured approximately 1.5 mm in diameter.  All proximal vein graft anastomoses were placed directly to the ascending aorta prior to removal of the aortic cross clamp.  The septal myocardial temperature rose rapidly after reperfusion of the left internal mammary artery  graft.  The aortic cross clamp was removed after a total cross clamp time of 73 minutes.   Procedure Completion:  All proximal and distal coronary anastomoses were inspected for hemostasis and appropriate graft orientation. Epicardial pacing wires are fixed to the right ventricular outflow tract and to the right atrial appendage. The patient is rewarmed to 37C temperature. The patient is weaned and disconnected from cardiopulmonary bypass.  The patient's rhythm at separation from bypass was sinus.  The patient was weaned from cardioplegic bypass without any inotropic support. Total cardiopulmonary bypass time for the operation was 96 minutes.  Followup transesophageal echocardiogram performed after separation from bypass revealed no changes from the preoperative exam.  The aortic and venous cannula were removed uneventfully. Protamine was administered to reverse the anticoagulation. The mediastinum and pleural space were inspected for hemostasis and irrigated with saline solution. The mediastinum and both pleural spaces were drained using 4s chest tubes placed through separate stab incisions inferiorly.  The soft tissues anterior to the aorta were reapproximated loosely. The sternum is closed with double strength sternal wire. The soft tissues anterior to the sternum were closed in multiple layers and the skin is closed with a running subcuticular skin closure.  The post-bypass portion of the operation was notable for stable rhythm and hemodynamics.  No blood products were administered during the operation.   Disposition:  The patient tolerated the procedure well and is transported to the surgical intensive care in stable condition. There are no intraoperative complications. All sponge instrument and needle counts are verified correct at completion of the operation.    Valentina Gu. Roxy Manns MD 02/13/2014 1:28 PM

## 2014-02-13 NOTE — Anesthesia Preprocedure Evaluation (Addendum)
Anesthesia Evaluation  Patient identified by MRN, date of birth, ID band Patient awake    Reviewed: Allergy & Precautions, H&P , NPO status , Patient's Chart, lab work & pertinent test results, reviewed documented beta blocker date and time   History of Anesthesia Complications Negative for: history of anesthetic complications  Airway Mallampati: II TM Distance: >3 FB Neck ROM: Full    Dental  (+) Poor Dentition, Teeth Intact, Dental Advisory Given   Pulmonary asthma ,    Pulmonary exam normal       Cardiovascular + angina + CAD Rhythm:Regular Rate:Normal + Systolic murmurs Study Conclusions  - Left ventricle: The cavity size was normal. Wall thickness was normal. Systolic function was normal. The estimated ejection fraction was in the range of 60% to 65%. Doppler parameters are consistent with abnormal left ventricular relaxation (grade 1 diastolic dysfunction). - Aortic valve: AV is thickened, calcified with mildly restricted motion. Peak and mean gradients through the valve are 23 and 17 mm Hg respectively consistent with mild AS. - Mitral valve: There was mild regurgitation.    Neuro/Psych negative neurological ROS  negative psych ROS   GI/Hepatic negative GI ROS, Neg liver ROS,   Endo/Other  diabetes  Renal/GU negative Renal ROS     Musculoskeletal   Abdominal   Peds  Hematology   Anesthesia Other Findings   Reproductive/Obstetrics                       Anesthesia Physical Anesthesia Plan  ASA: IV  Anesthesia Plan: General   Post-op Pain Management:    Induction: Intravenous  Airway Management Planned: Oral ETT  Additional Equipment: 3D TEE, PA Cath, Ultrasound Guidance Line Placement and Arterial line  Intra-op Plan:   Post-operative Plan: Post-operative intubation/ventilation  Informed Consent: I have reviewed the patients History and Physical, chart, labs and discussed  the procedure including the risks, benefits and alternatives for the proposed anesthesia with the patient or authorized representative who has indicated his/her understanding and acceptance.   Dental advisory given  Plan Discussed with: CRNA, Anesthesiologist and Surgeon  Anesthesia Plan Comments:        Anesthesia Quick Evaluation

## 2014-02-13 NOTE — Brief Op Note (Addendum)
02/12/2014 - 02/13/2014  11:49 AM  PATIENT:  [name]  63 y.o. male  PRE-OPERATIVE DIAGNOSIS:  CAD  POST-OPERATIVE DIAGNOSIS:  CAD  PROCEDURE:   CORONARY ARTERY BYPASS GRAFTING x 4 (LIMA-LAD, SVG-OM, SVG-dRCA-PL) ENDOSCOPIC VEIN HARVEST RIGHT LEG  SURGEON:    Rexene Alberts, MD  ASSISTANTS:  Suzzanne Cloud, PA-C  ANESTHESIA:   Duane Boston, MD  CROSSCLAMP TIME:   53'  CARDIOPULMONARY BYPASS TIME: 30'  FINDINGS:  Tricuspid aortic valve with thin, normally moving leaflets  Sclerosis at commissures of aortic valve with mild aortic stenosis  Normal LV systolic function  Small caliber but otherwise good quality LIMA conduit for grafting  Good quality SVG conduit for grafting  Relatively small coronary arteries with diffuse CAD  COMPLICATIONS: None  BASELINE WEIGHT: 68 kg  PATIENT DISPOSITION:   TO SICU IN STABLE CONDITION  Daxtin Leiker H 02/13/2014 1:24 PM

## 2014-02-13 NOTE — Procedures (Signed)
Extubation Procedure Note  Patient Details:   Name: Melvin Dunn DOB: 1951/03/26 MRN: [id number]   Airway Documentation:     Evaluation  O2 sats: stable throughout Complications: No apparent complications Patient did tolerate procedure well. Bilateral Breath Sounds: Clear;Diminished   Yes NIF -25 VC [place], [place] 02/13/2014, 6:22 PM

## 2014-02-13 NOTE — Progress Notes (Deleted)
  Echocardiogram 2D Echocardiogram has been performed.  Melvin Dunn 02/13/2014, 9:43 AM

## 2014-02-13 NOTE — Anesthesia Postprocedure Evaluation (Signed)
  Anesthesia Post-op Note  Patient: Melvin Dunn  Procedure(s) Performed: Procedure(s): CORONARY ARTERY BYPASS GRAFTING (CABG) TIMES FOUR USING LEFT INTERNAL MAMMARY ARTERY AND RIGHT SAPHENOUS LEG VEIN HARVESTED ENDOSCOPICALLY (N/A) INTRAOPERATIVE TRANSESOPHAGEAL ECHOCARDIOGRAM (N/A)  Patient Location: SICU  Anesthesia Type:General  Level of Consciousness: Patient remains intubated per anesthesia plan  Airway and Oxygen Therapy: Patient remains intubated per anesthesia plan and Patient placed on Ventilator (see vital sign flow sheet for setting)  Post-op Pain: none  Post-op Assessment: Post-op Vital signs reviewed, Patient's Cardiovascular Status Stable and Respiratory Function Stable  Post-op Vital Signs: Reviewed and stable  Last Vitals:  Filed Vitals:   02/13/14 1401  BP: 119/65  Pulse: 85  Temp:   Resp: 12    Complications: No apparent anesthesia complications

## 2014-02-13 NOTE — Progress Notes (Signed)
Intubated, starting to wake up  BP 97/56  Pulse 90  Temp(Src) 98.1 F (36.7 C) (Oral)  Resp 32  Ht 5\' 8"  (1.727 m)  Wt 149 lb 14.6 oz (68 kg)  BMI 22.80 kg/m2  SpO2 100% On 35 mcg/min neo  CI > 2.0  Intake/Output Summary (Last 24 hours) at 02/13/14 1656 Last data filed at 02/13/14 1636  Gross per 24 hour  Intake 4890.93 ml  Output   2602 ml  Net 2288.93 ml   K 3.7 - supplemented  Hct 31  Doing well early postop, wean to extubate

## 2014-02-13 NOTE — OR Nursing (Signed)
Chest incision made at 1003

## 2014-02-13 NOTE — Progress Notes (Signed)
  Echocardiogram Echocardiogram Transesophageal has been performed.  Melvin Dunn 02/13/2014, 9:53 AM

## 2014-02-13 NOTE — Progress Notes (Signed)
Paged MD due to pt increased temp of 102.2. Dr. Roxan Hockey returned call and gave orders for solumedrol 60mg . Continuing to monitor pt - Trevelle Mcgurn Ventry-Smith, RN.

## 2014-02-13 NOTE — Anesthesia Procedure Notes (Addendum)
Procedure Name: Intubation Date/Time: 02/13/2014 8:51 AM Performed by: Barrington Ellison Pre-anesthesia Checklist: Patient identified, Emergency Drugs available, Suction available and Patient being monitored Patient Re-evaluated:Patient Re-evaluated prior to inductionOxygen Delivery Method: Circle system utilized Preoxygenation: Pre-oxygenation with 100% oxygen Intubation Type: IV induction Ventilation: Mask ventilation without difficulty Laryngoscope Size: Mac and 4 Grade View: Grade II Tube type: Oral Tube size: 8.0 mm Number of attempts: 1 Airway Equipment and Method: Stylet Placement Confirmation: ETT inserted through vocal cords under direct vision,  positive ETCO2 and breath sounds checked- equal and bilateral Secured at: 22 cm Tube secured with: Tape Dental Injury: Teeth and Oropharynx as per pre-operative assessment       Right IJV with needle in place for introducer.

## 2014-02-14 ENCOUNTER — Encounter (HOSPITAL_COMMUNITY): Payer: Self-pay | Admitting: Thoracic Surgery (Cardiothoracic Vascular Surgery)

## 2014-02-14 ENCOUNTER — Inpatient Hospital Stay (HOSPITAL_COMMUNITY): Payer: BC Managed Care – PPO

## 2014-02-14 LAB — POCT I-STAT, CHEM 8
BUN: 10 mg/dL (ref 6–23)
BUN: 15 mg/dL (ref 6–23)
CALCIUM ION: 0.92 mmol/L — AB (ref 1.13–1.30)
CALCIUM ION: 1.12 mmol/L — AB (ref 1.13–1.30)
CREATININE: 1 mg/dL (ref 0.50–1.35)
Chloride: 110 mEq/L (ref 96–112)
Chloride: 98 mEq/L (ref 96–112)
Creatinine, Ser: 0.6 mg/dL (ref 0.50–1.35)
Glucose, Bld: 109 mg/dL — ABNORMAL HIGH (ref 70–99)
Glucose, Bld: 147 mg/dL — ABNORMAL HIGH (ref 70–99)
HCT: 24 % — ABNORMAL LOW (ref 39.0–52.0)
HEMATOCRIT: 15 % — AB (ref 39.0–52.0)
HEMOGLOBIN: 5.1 g/dL — AB (ref 13.0–17.0)
Hemoglobin: 8.2 g/dL — ABNORMAL LOW (ref 13.0–17.0)
Potassium: 3 mEq/L — ABNORMAL LOW (ref 3.7–5.3)
Potassium: 4.5 mEq/L (ref 3.7–5.3)
SODIUM: 136 meq/L — AB (ref 137–147)
SODIUM: 142 meq/L (ref 137–147)
TCO2: 16 mmol/L (ref 0–100)
TCO2: 25 mmol/L (ref 0–100)

## 2014-02-14 LAB — GLUCOSE, CAPILLARY
GLUCOSE-CAPILLARY: 94 mg/dL (ref 70–99)
GLUCOSE-CAPILLARY: 96 mg/dL (ref 70–99)
GLUCOSE-CAPILLARY: 126 mg/dL — AB (ref 70–99)
GLUCOSE-CAPILLARY: 133 mg/dL — AB (ref 70–99)
GLUCOSE-CAPILLARY: 114 mg/dL — AB (ref 70–99)
GLUCOSE-CAPILLARY: 159 mg/dL — AB (ref 70–99)
GLUCOSE-CAPILLARY: 110 mg/dL — AB (ref 70–99)
GLUCOSE-CAPILLARY: 121 mg/dL — AB (ref 70–99)
GLUCOSE-CAPILLARY: 129 mg/dL — AB (ref 70–99)
GLUCOSE-CAPILLARY: 122 mg/dL — AB (ref 70–99)
GLUCOSE-CAPILLARY: 101 mg/dL — AB (ref 70–99)
Glucose-Capillary: 107 mg/dL — ABNORMAL HIGH (ref 70–99)
Glucose-Capillary: 110 mg/dL — ABNORMAL HIGH (ref 70–99)
Glucose-Capillary: 117 mg/dL — ABNORMAL HIGH (ref 70–99)
Glucose-Capillary: 119 mg/dL — ABNORMAL HIGH (ref 70–99)
Glucose-Capillary: 120 mg/dL — ABNORMAL HIGH (ref 70–99)
Glucose-Capillary: 126 mg/dL — ABNORMAL HIGH (ref 70–99)
Glucose-Capillary: 129 mg/dL — ABNORMAL HIGH (ref 70–99)
Glucose-Capillary: 130 mg/dL — ABNORMAL HIGH (ref 70–99)
Glucose-Capillary: 130 mg/dL — ABNORMAL HIGH (ref 70–99)
Glucose-Capillary: 135 mg/dL — ABNORMAL HIGH (ref 70–99)
Glucose-Capillary: 140 mg/dL — ABNORMAL HIGH (ref 70–99)
Glucose-Capillary: 148 mg/dL — ABNORMAL HIGH (ref 70–99)
Glucose-Capillary: 169 mg/dL — ABNORMAL HIGH (ref 70–99)
Glucose-Capillary: 94 mg/dL (ref 70–99)
Glucose-Capillary: 96 mg/dL (ref 70–99)

## 2014-02-14 LAB — CBC
HEMATOCRIT: 25.3 % — AB (ref 39.0–52.0)
HEMATOCRIT: 25 % — AB (ref 39.0–52.0)
HEMOGLOBIN: 8.6 g/dL — AB (ref 13.0–17.0)
HEMOGLOBIN: 8.6 g/dL — AB (ref 13.0–17.0)
MCH: 28.4 pg (ref 26.0–34.0)
MCH: 28.6 pg (ref 26.0–34.0)
MCHC: 34 g/dL (ref 30.0–36.0)
MCHC: 34.4 g/dL (ref 30.0–36.0)
MCV: 83.5 fL (ref 78.0–100.0)
MCV: 83.1 fL (ref 78.0–100.0)
Platelets: 158 10*3/uL (ref 150–400)
Platelets: 165 10*3/uL (ref 150–400)
RBC: 3.03 MIL/uL — ABNORMAL LOW (ref 4.22–5.81)
RBC: 3.01 MIL/uL — ABNORMAL LOW (ref 4.22–5.81)
RDW: 13.6 % (ref 11.5–15.5)
RDW: 13.8 % (ref 11.5–15.5)
WBC: 18.6 10*3/uL — ABNORMAL HIGH (ref 4.0–10.5)
WBC: 20.1 10*3/uL — AB (ref 4.0–10.5)

## 2014-02-14 LAB — CREATININE, SERUM
Creatinine, Ser: 0.94 mg/dL (ref 0.50–1.35)
GFR calc non Af Amer: 87 mL/min — ABNORMAL LOW (ref >90)

## 2014-02-14 LAB — BASIC METABOLIC PANEL
Anion gap: 16 — ABNORMAL HIGH (ref 5–15)
BUN: 11 mg/dL (ref 6–23)
CALCIUM: 7.4 mg/dL — AB (ref 8.4–10.5)
CO2: 21 meq/L (ref 19–32)
Chloride: 102 mEq/L (ref 96–112)
Creatinine, Ser: 0.79 mg/dL (ref 0.50–1.35)
GFR calc Af Amer: >90 mL/min (ref >90)
GLUCOSE: 112 mg/dL — AB (ref 70–99)
Potassium: 3.9 mEq/L (ref 3.7–5.3)
Sodium: 139 mEq/L (ref 137–147)

## 2014-02-14 LAB — BLOOD GAS, ARTERIAL
ACID-BASE DEFICIT: 2.2 mmol/L — AB (ref 0.0–2.0)
Bicarbonate: 21.8 mEq/L (ref 20.0–24.0)
Drawn by: 36496
FIO2: 0.28 %
O2 Saturation: 96.7 %
PCO2 ART: 37.2 mmHg (ref 35.0–45.0)
PH ART: 7.391 (ref 7.350–7.450)
PO2 ART: 93 mmHg (ref 80.0–100.0)
Patient temperature: 100.4
TCO2: 22.9 mmol/L (ref 0–100)

## 2014-02-14 LAB — MAGNESIUM
Magnesium: 2.4 mg/dL (ref 1.5–2.5)
Magnesium: 2.3 mg/dL (ref 1.5–2.5)

## 2014-02-14 MED ORDER — INSULIN DETEMIR 100 UNIT/ML ~~LOC~~ SOLN
20.0000 [IU] | Freq: Once | SUBCUTANEOUS | Status: AC
  Administered 2014-02-14: 20 [IU] via SUBCUTANEOUS
  Filled 2014-02-14: qty 0.2

## 2014-02-14 MED ORDER — POTASSIUM CHLORIDE 10 MEQ/50ML IV SOLN
10.0000 meq | INTRAVENOUS | Status: AC
  Administered 2014-02-14 (×3): 10 meq via INTRAVENOUS

## 2014-02-14 MED ORDER — INSULIN ASPART 100 UNIT/ML ~~LOC~~ SOLN
0.0000 [IU] | SUBCUTANEOUS | Status: DC
  Administered 2014-02-14: 2 [IU] via SUBCUTANEOUS
  Administered 2014-02-14: 4 [IU] via SUBCUTANEOUS
  Administered 2014-02-14: 2 [IU] via SUBCUTANEOUS
  Administered 2014-02-15: 4 [IU] via SUBCUTANEOUS
  Administered 2014-02-15: 2 [IU] via SUBCUTANEOUS

## 2014-02-14 MED ORDER — INSULIN DETEMIR 100 UNIT/ML ~~LOC~~ SOLN
20.0000 [IU] | Freq: Every day | SUBCUTANEOUS | Status: DC
  Filled 2014-02-14: qty 0.2

## 2014-02-14 MED ORDER — FUROSEMIDE 10 MG/ML IJ SOLN
20.0000 mg | Freq: Four times a day (QID) | INTRAMUSCULAR | Status: DC
  Administered 2014-02-14 (×2): 20 mg via INTRAVENOUS
  Filled 2014-02-14 (×2): qty 2

## 2014-02-14 MED ORDER — MORPHINE SULFATE 2 MG/ML IJ SOLN
2.0000 mg | INTRAMUSCULAR | Status: DC | PRN
  Administered 2014-02-15: 2 mg via INTRAVENOUS
  Filled 2014-02-14: qty 1

## 2014-02-14 MED FILL — Potassium Chloride Inj 2 mEq/ML: INTRAVENOUS | Qty: 40 | Status: AC

## 2014-02-14 MED FILL — Magnesium Sulfate Inj 50%: INTRAMUSCULAR | Qty: 10 | Status: AC

## 2014-02-14 MED FILL — Heparin Sodium (Porcine) Inj 1000 Unit/ML: INTRAMUSCULAR | Qty: 30 | Status: AC

## 2014-02-14 NOTE — Progress Notes (Signed)
CT surgery p.m. Rounds  Doing well after multivessel CABG Currently out of bed to chair, resting comfortably Normal sinus rhythm, systolic blood pressure 92 mmHg P.m. labs reviewed--hemoglobin 8.2, potassium 4.5, glucose 147

## 2014-02-14 NOTE — Progress Notes (Addendum)
[address]       [place] [address]             [phone]      1 Day Post-Op Procedure(s) (LRB): CORONARY ARTERY BYPASS GRAFTING (CABG) TIMES FOUR USING LEFT INTERNAL MAMMARY ARTERY AND RIGHT SAPHENOUS LEG VEIN HARVESTED ENDOSCOPICALLY (N/A) INTRAOPERATIVE TRANSESOPHAGEAL ECHOCARDIOGRAM (N/A)  Subjective:  [name] states he feels like what he expected.  He has some incisional soreness and dyspnea.  He states that he is not taking deep breaths though due to sternal discomfort.  The patient was encouraged to use his IS several times per hour and to take deep breaths as tolerated.  Objective: Vital signs in last 24 hours: Temp:  [97 F (36.1 C)-102.4 F (39.1 C)] 99.5 F (37.5 C) (10/01 0700) Pulse Rate:  [80-96] 90 (10/01 0700) Cardiac Rhythm:  [-] Normal sinus rhythm (10/01 0000) Resp:  [7-32] 19 (10/01 0700) BP: (89-130)/(52-100) 117/58 mmHg (10/01 0700) SpO2:  [91 %-100 %] 96 % (10/01 0700) Arterial Line BP: (90-153)/(45-73) 139/52 mmHg (10/01 0700) FiO2 (%):  [28 %-50 %] 28 % (09/30 2000) Weight:  [149 lb 14.6 oz (68 kg)-158 lb 4.6 oz (71.8 kg)] 158 lb 4.6 oz (71.8 kg) (10/01 0500)  Hemodynamic parameters for last 24 hours: PAP: (19-38)/(3-22) 21/6 mmHg CO:  [3.3 L/min-5.8 L/min] 5.8 L/min CI:  [1.9 L/min/m2-3.3 L/min/m2] 3.3 L/min/m2  Intake/Output from previous day: 09/30 0701 - 10/01 0700 In: 7190.4 [I.V.:5010.4; Blood:350; IV Piggyback:1830] Out: 6314 [Urine:3330; Emesis/NG output:50; Blood:700; Chest Tube:1138]  General appearance: alert, cooperative and no distress Heart: regular rate and rhythm Lungs: clear to auscultation bilaterally Abdomen: soft, non-tender; bowel sounds normal; no masses,  no organomegaly Extremities: edema minimal Wound: clean and dry  Lab Results:  Recent Labs  02/13/14 2000 02/13/14 2013 02/14/14 0410  WBC 17.6*  --  18.6*  HGB 9.4* 8.5* 8.6*  HCT 27.7* 25.0* 25.3*  PLT 184  --  158   BMET:    Recent Labs  02/13/14 0129  02/13/14 2013 02/14/14 0410  NA 139  < > 138 139  K 3.7  < > 4.4 3.9  CL 103  < > 104 102  CO2 25  --   --  21  GLUCOSE 105*  < > 124* 112*  BUN 20  < > 10 11  CREATININE 0.99  < > 0.80 0.79  CALCIUM 9.0  --   --  7.4*  < > = values in this interval not displayed.  PT/INR:  Recent Labs  02/13/14 1400  LABPROT 16.4*  INR 1.32   ABG    Component Value Date/Time   PHART 7.391 02/14/2014 0311   HCO3 21.8 02/14/2014 0311   TCO2 22.9 02/14/2014 0311   ACIDBASEDEF 2.2* 02/14/2014 0311   O2SAT 96.7 02/14/2014 0311   CBG (last 3)   Recent Labs  02/13/14 2207 02/13/14 2257 02/14/14 0001  GLUCAP 110* 159* 120*    Assessment/Plan: S/P Procedure(s) (LRB): CORONARY ARTERY BYPASS GRAFTING (CABG) TIMES FOUR USING LEFT INTERNAL MAMMARY ARTERY AND RIGHT SAPHENOUS LEG VEIN HARVESTED ENDOSCOPICALLY (N/A) INTRAOPERATIVE TRANSESOPHAGEAL ECHOCARDIOGRAM (N/A)  1. CV- maintaining NSR, off all drips- will start Lopressor today 2. Pulm- wean oxygen as tolerated, CXR with increase in pleural density at Left Lung Apex, suggested to be pleural fluid, however likely atelectasis vs upper lobe collapse 3. Renal- creatinine, lytes okay, + hypervolemia, weight is up 8 lbs, will give dose of IV Lasix today 4. Expected  Post Operative Blood Loss Anemia- moderate, Hgb 8.6, will monitor 5. ID- +leukocytosis, fever improving- likley SIRS will monitor 6. CBGs- controlled, wean insulin drip as tolerated, will start Levemir today 7. Dispo- patient doing well, will leave chest tubes in place today, POD #1 progression orders   LOS: 2 days    BARRETT, ERIN 02/14/2014  I have seen and examined the patient and agree with the assessment and plan as outlined.  Looks good POD1.  Maintaining NSR w/ stable hemodynamics w/out any drips.  CXR reveals significant atelectasis of LUL +/- pleural fluid (blood) at left apex.  Leave chest tubes in place and mobilize up and out of  bed.  OWEN,CLARENCE H 02/14/2014 8:05 AM

## 2014-02-14 NOTE — Progress Notes (Signed)
MD notified that patient's BP dropped into the 70-80 with MAP between 50-60's. Sat back in chair with feet up and BP has steadily rose into 90/40's. MD informed RN staff that he would be coming to see patient at bedside. Will continue to monitor. Richardean Sale, RN

## 2014-02-15 ENCOUNTER — Inpatient Hospital Stay (HOSPITAL_COMMUNITY): Payer: BC Managed Care – PPO

## 2014-02-15 DIAGNOSIS — I2510 Atherosclerotic heart disease of native coronary artery without angina pectoris: Secondary | ICD-10-CM

## 2014-02-15 LAB — BASIC METABOLIC PANEL
Anion gap: 10 (ref 5–15)
BUN: 17 mg/dL (ref 6–23)
CALCIUM: 7.8 mg/dL — AB (ref 8.4–10.5)
CO2: 25 mEq/L (ref 19–32)
Chloride: 101 mEq/L (ref 96–112)
Creatinine, Ser: 0.8 mg/dL (ref 0.50–1.35)
GFR calc Af Amer: >90 mL/min (ref >90)
Glucose, Bld: 127 mg/dL — ABNORMAL HIGH (ref 70–99)
Potassium: 4.6 mEq/L (ref 3.7–5.3)
SODIUM: 136 meq/L — AB (ref 137–147)

## 2014-02-15 LAB — CBC
HCT: 23.5 % — ABNORMAL LOW (ref 39.0–52.0)
Hemoglobin: 8 g/dL — ABNORMAL LOW (ref 13.0–17.0)
MCH: 28.4 pg (ref 26.0–34.0)
MCHC: 34 g/dL (ref 30.0–36.0)
MCV: 83.3 fL (ref 78.0–100.0)
PLATELETS: 169 10*3/uL (ref 150–400)
RBC: 2.82 MIL/uL — ABNORMAL LOW (ref 4.22–5.81)
RDW: 13.7 % (ref 11.5–15.5)
WBC: 19.5 10*3/uL — ABNORMAL HIGH (ref 4.0–10.5)

## 2014-02-15 LAB — GLUCOSE, CAPILLARY
GLUCOSE-CAPILLARY: 174 mg/dL — AB (ref 70–99)
GLUCOSE-CAPILLARY: 145 mg/dL — AB (ref 70–99)
Glucose-Capillary: 135 mg/dL — ABNORMAL HIGH (ref 70–99)
Glucose-Capillary: 180 mg/dL — ABNORMAL HIGH (ref 70–99)
Glucose-Capillary: 184 mg/dL — ABNORMAL HIGH (ref 70–99)

## 2014-02-15 MED ORDER — MOMETASONE FURO-FORMOTEROL FUM 200-5 MCG/ACT IN AERO: 2.0000 | INHALATION_SPRAY | Freq: Every day | RESPIRATORY_TRACT | Status: DC

## 2014-02-15 MED ORDER — ATORVASTATIN CALCIUM 40 MG PO TABS
40.0000 mg | ORAL_TABLET | Freq: Every day | ORAL | Status: DC
  Administered 2014-02-15 – 2014-02-17 (×3): 40 mg via ORAL
  Filled 2014-02-15 (×4): qty 1

## 2014-02-15 MED ORDER — FLUTICASONE PROPIONATE 50 MCG/ACT NA SUSP: 2.0000 | Freq: Every day | NASAL | Status: DC

## 2014-02-15 MED ORDER — ALBUTEROL SULFATE HFA 108 (90 BASE) MCG/ACT IN AERS: 1.0000 | INHALATION_SPRAY | RESPIRATORY_TRACT | Status: DC | PRN

## 2014-02-15 MED ORDER — ATORVASTATIN CALCIUM 40 MG PO TABS: 40.0000 mg | ORAL_TABLET | Freq: Every day | ORAL | Status: DC

## 2014-02-15 MED ORDER — FUROSEMIDE 40 MG PO TABS
40.0000 mg | ORAL_TABLET | Freq: Every day | ORAL | Status: AC
  Administered 2014-02-15 – 2014-02-17 (×3): 40 mg via ORAL
  Filled 2014-02-15 (×3): qty 1

## 2014-02-15 MED ORDER — INSULIN ASPART 100 UNIT/ML ~~LOC~~ SOLN
0.0000 [IU] | Freq: Three times a day (TID) | SUBCUTANEOUS | Status: DC
  Administered 2014-02-15: 2 [IU] via SUBCUTANEOUS
  Administered 2014-02-15 (×2): 4 [IU] via SUBCUTANEOUS
  Administered 2014-02-16 – 2014-02-17 (×3): 2 [IU] via SUBCUTANEOUS

## 2014-02-15 MED ORDER — POTASSIUM CHLORIDE CRYS ER 20 MEQ PO TBCR: 20.0000 meq | EXTENDED_RELEASE_TABLET | Freq: Every day | ORAL | Status: DC

## 2014-02-15 MED ORDER — POTASSIUM CHLORIDE CRYS ER 20 MEQ PO TBCR
20.0000 meq | EXTENDED_RELEASE_TABLET | Freq: Every day | ORAL | Status: AC
  Administered 2014-02-15 – 2014-02-17 (×3): 20 meq via ORAL
  Filled 2014-02-15 (×3): qty 1

## 2014-02-15 MED ORDER — GUAIFENESIN ER 600 MG PO TB12
600.0000 mg | ORAL_TABLET | Freq: Two times a day (BID) | ORAL | Status: DC
  Administered 2014-02-15 – 2014-02-18 (×7): 600 mg via ORAL
  Filled 2014-02-15 (×9): qty 1

## 2014-02-15 MED ORDER — FUROSEMIDE 40 MG PO TABS: 40.0000 mg | ORAL_TABLET | Freq: Every day | ORAL | Status: DC

## 2014-02-15 MED ORDER — FLUTICASONE PROPIONATE 50 MCG/ACT NA SUSP
2.0000 | Freq: Every day | NASAL | Status: DC
  Administered 2014-02-15 – 2014-02-17 (×2): 2 via NASAL
  Filled 2014-02-15 (×2): qty 16

## 2014-02-15 MED ORDER — SODIUM CHLORIDE 0.9 % IV SOLN: 250.0000 mL | INTRAVENOUS | Status: DC | PRN

## 2014-02-15 MED ORDER — LEVALBUTEROL HCL 0.63 MG/3ML IN NEBU
0.6300 mg | INHALATION_SOLUTION | Freq: Three times a day (TID) | RESPIRATORY_TRACT | Status: DC
  Administered 2014-02-15 – 2014-02-17 (×5): 0.63 mg via RESPIRATORY_TRACT
  Filled 2014-02-15 (×11): qty 3

## 2014-02-15 MED ORDER — MOVING RIGHT ALONG BOOK
Freq: Once | Status: AC
  Administered 2014-02-17: 17:00:00
  Filled 2014-02-15: qty 1

## 2014-02-15 MED ORDER — INSULIN DETEMIR 100 UNIT/ML ~~LOC~~ SOLN
10.0000 [IU] | Freq: Every day | SUBCUTANEOUS | Status: DC
  Administered 2014-02-15 – 2014-02-16 (×2): 10 [IU] via SUBCUTANEOUS
  Filled 2014-02-15 (×4): qty 0.1

## 2014-02-15 MED ORDER — MOMETASONE FURO-FORMOTEROL FUM 200-5 MCG/ACT IN AERO
2.0000 | INHALATION_SPRAY | Freq: Every day | RESPIRATORY_TRACT | Status: DC
  Administered 2014-02-15 – 2014-02-17 (×3): 2 via RESPIRATORY_TRACT
  Filled 2014-02-15 (×3): qty 8.8

## 2014-02-15 MED ORDER — SODIUM CHLORIDE 0.9 % IJ SOLN: 3.0000 mL | INTRAMUSCULAR | Status: DC | PRN

## 2014-02-15 MED ORDER — TRAMADOL HCL 50 MG PO TABS: 50.0000 mg | ORAL_TABLET | ORAL | Status: DC | PRN

## 2014-02-15 MED ORDER — ASPIRIN EC 325 MG PO TBEC
325.0000 mg | DELAYED_RELEASE_TABLET | Freq: Every day | ORAL | Status: DC
  Administered 2014-02-15 – 2014-02-18 (×4): 325 mg via ORAL
  Filled 2014-02-15 (×4): qty 1

## 2014-02-15 MED ORDER — SODIUM CHLORIDE 0.9 % IJ SOLN
3.0000 mL | Freq: Two times a day (BID) | INTRAMUSCULAR | Status: DC
  Administered 2014-02-15 – 2014-02-17 (×4): 3 mL via INTRAVENOUS

## 2014-02-15 NOTE — Progress Notes (Signed)
Patient ID: Melvin Dunn, male   DOB: 1951/04/06, 63 y.o.   MRN: [id number] EVENING ROUNDS NOTE :     [address]       ,[place] [address]             [phone]                 2 Days Post-Op Procedure(s) (LRB): CORONARY ARTERY BYPASS GRAFTING (CABG) TIMES FOUR USING LEFT INTERNAL MAMMARY ARTERY AND RIGHT SAPHENOUS LEG VEIN HARVESTED ENDOSCOPICALLY (N/A) INTRAOPERATIVE TRANSESOPHAGEAL ECHOCARDIOGRAM (N/A)  Total Length of Stay:  LOS: 3 days  BP 116/58  Pulse 105  Temp(Src) 98.3 F (36.8 C) (Oral)  Resp 19  Ht 5\' 8"  (1.727 m)  Wt 156 lb 8.4 oz (71 kg)  BMI 23.81 kg/m2  SpO2 98%  .Intake/Output     10/01 0701 - 10/02 0700 10/02 0701 - 10/03 0700   P.O. 120 300   I.V. (mL/kg) 476.6 (6.7) 40 (0.6)   Blood     IV Piggyback 250    Total Intake(mL/kg) 846.6 (11.9) 340 (4.8)   Urine (mL/kg/hr) 1820 (1.1) 605 (0.8)   Emesis/NG output     Blood     Chest Tube 540 (0.3)    Total Output 2360 605   Net -1513.4 -265          . sodium chloride Stopped (02/15/14 1000)     Lab Results  Component Value Date   WBC 19.5* 02/15/2014   HGB 8.0* 02/15/2014   HCT 23.5* 02/15/2014   PLT 169 02/15/2014   GLUCOSE 127* 02/15/2014   ALT 31 02/12/2014   AST 25 02/12/2014   NA 136* 02/15/2014   K 4.6 02/15/2014   CL 101 02/15/2014   CREATININE 0.80 02/15/2014   BUN 17 02/15/2014   CO2 25 02/15/2014   INR 1.32 02/13/2014   HGBA1C 6.1* 02/12/2014   Stable day, voided after foley out Going to 2w now   Grace Isaac MD  Beeper [phone] Office [phone] 02/15/2014 6:11 PM

## 2014-02-15 NOTE — Progress Notes (Addendum)
[address]       [place] [address]             [phone]      2 Days Post-Op Procedure(s) (LRB): CORONARY ARTERY BYPASS GRAFTING (CABG) TIMES FOUR USING LEFT INTERNAL MAMMARY ARTERY AND RIGHT SAPHENOUS LEG VEIN HARVESTED ENDOSCOPICALLY (N/A) INTRAOPERATIVE TRANSESOPHAGEAL ECHOCARDIOGRAM (N/A)  Subjective:  Melvin Dunn is feeling okay.  He states he is doing better than yesterday.  He has no ambulated the unit yet and has been encouraged to do so.  Objective: Vital signs in last 24 hours: Temp:  [97.7 F (36.5 C)-99.1 F (37.3 C)] 98 F (36.7 C) (10/02 0731) Pulse Rate:  [86-102] 95 (10/02 0700) Cardiac Rhythm:  [-] Normal sinus rhythm (10/02 0400) Resp:  [13-24] 19 (10/02 0700) BP: (84-125)/(48-64) 107/58 mmHg (10/02 0700) SpO2:  [91 %-100 %] 99 % (10/02 0700) Arterial Line BP: (87-139)/(24-58) 137/52 mmHg (10/02 0500) Weight:  [156 lb 8.4 oz (71 kg)] 156 lb 8.4 oz (71 kg) (10/02 0500)  Hemodynamic parameters for last 24 hours: PAP: (19-27)/(5-11) 19/6 mmHg  Intake/Output from previous day: 10/01 0701 - 10/02 0700 In: 846.6 [P.O.:120; I.V.:476.6; IV Piggyback:250] Out: 2360 [Urine:1820; Chest Tube:540]  General appearance: alert, cooperative and no distress Heart: regular rate and rhythm Lungs: clear to auscultation bilaterally Abdomen: soft, non-tender; bowel sounds normal; no masses,  no organomegaly Extremities: edema trace Wound: clean and dry  Lab Results:  Recent Labs  02/14/14 1630  02/14/14 1651 02/15/14 0430  WBC 20.1*  --   --  19.5*  HGB 8.6*  < > 8.2* 8.0*  HCT 25.0*  < > 24.0* 23.5*  PLT 165  --   --  169  < > = values in this interval not displayed. BMET:  Recent Labs  02/14/14 0410  02/14/14 1651 02/15/14 0430  NA 139  < > 136* 136*  K 3.9  < > 4.5 4.6  CL 102  < > 98 101  CO2 21  --   --  25  GLUCOSE 112*  < > 147* 127*  BUN 11  < > 15 17  CREATININE 0.79  < > 1.00 0.80  CALCIUM 7.4*  --   --  7.8*  < > =  values in this interval not displayed.  PT/INR:  Recent Labs  02/13/14 1400  LABPROT 16.4*  INR 1.32   ABG    Component Value Date/Time   PHART 7.391 02/14/2014 0311   HCO3 21.8 02/14/2014 0311   TCO2 25 02/14/2014 1651   ACIDBASEDEF 2.2* 02/14/2014 0311   O2SAT 96.7 02/14/2014 0311   CBG (last 3)   Recent Labs  02/14/14 1927 02/14/14 2312 02/15/14 0437  GLUCAP 169* 101* 135*    Assessment/Plan: S/P Procedure(s) (LRB): CORONARY ARTERY BYPASS GRAFTING (CABG) TIMES FOUR USING LEFT INTERNAL MAMMARY ARTERY AND RIGHT SAPHENOUS LEG VEIN HARVESTED ENDOSCOPICALLY (N/A) INTRAOPERATIVE TRANSESOPHAGEAL ECHOCARDIOGRAM (N/A)  1. CV- NSR, off all drips, continue Lopressor for now, restarted home Lipitor 2. Pulm- wean oxygen as tolerated, CXR shows improvement of LUL pleural fluid, patient with cough unable to expectorate sputum, will restart home Dulera, ProAir Inhaler, start Xopenex nebs, add Mucinex 3. Renal- creatinine, lytes okay, minimal volume overloaded, weight is up about 6 lbs since admission, will give oral Lasix 4. Expected Post operative anemia- remains stable, Hgb 8.0, will start Fe supplement 5. CBGs- controlled, wean insulin as patient is not a diabetic 6. Dispo- patient stable off all drips,  chest tubes had 680 cc output yesterday, will defer to staff in regards to removal, patient needs to ambulate, leave in ICU today   LOS: 3 days    Melvin Dunn, Melvin Dunn 02/15/2014  I have seen and examined the patient and agree with the assessment and plan as outlined.  D/C chest tubes.  Mobilize.  Pulm toilet.  Transfer step down.  Melvin Dunn 02/15/2014 8:23 AM

## 2014-02-16 ENCOUNTER — Inpatient Hospital Stay (HOSPITAL_COMMUNITY): Payer: BC Managed Care – PPO

## 2014-02-16 LAB — CBC
HCT: 27.8 % — ABNORMAL LOW (ref 39.0–52.0)
HEMOGLOBIN: 9.5 g/dL — AB (ref 13.0–17.0)
MCH: 29.6 pg (ref 26.0–34.0)
MCHC: 34.2 g/dL (ref 30.0–36.0)
MCV: 86.6 fL (ref 78.0–100.0)
Platelets: 230 10*3/uL (ref 150–400)
RBC: 3.21 MIL/uL — AB (ref 4.22–5.81)
RDW: 13.7 % (ref 11.5–15.5)
WBC: 20.8 10*3/uL — AB (ref 4.0–10.5)

## 2014-02-16 LAB — BASIC METABOLIC PANEL
Anion gap: 13 (ref 5–15)
BUN: 13 mg/dL (ref 6–23)
CALCIUM: 8.6 mg/dL (ref 8.4–10.5)
CHLORIDE: 101 meq/L (ref 96–112)
CO2: 24 meq/L (ref 19–32)
Creatinine, Ser: 0.86 mg/dL (ref 0.50–1.35)
GFR calc Af Amer: >90 mL/min (ref >90)
GFR calc non Af Amer: >90 mL/min (ref >90)
GLUCOSE: 119 mg/dL — AB (ref 70–99)
POTASSIUM: 5 meq/L (ref 3.7–5.3)
SODIUM: 138 meq/L (ref 137–147)

## 2014-02-16 LAB — GLUCOSE, CAPILLARY
GLUCOSE-CAPILLARY: 131 mg/dL — AB (ref 70–99)
Glucose-Capillary: 116 mg/dL — ABNORMAL HIGH (ref 70–99)
Glucose-Capillary: 153 mg/dL — ABNORMAL HIGH (ref 70–99)
Glucose-Capillary: 128 mg/dL — ABNORMAL HIGH (ref 70–99)

## 2014-02-16 MED ORDER — METOPROLOL TARTRATE 25 MG PO TABS
25.0000 mg | ORAL_TABLET | Freq: Two times a day (BID) | ORAL | Status: DC
  Administered 2014-02-16 (×2): 25 mg via ORAL
  Filled 2014-02-16 (×4): qty 1

## 2014-02-16 NOTE — Progress Notes (Signed)
CARDIAC REHAB PHASE I   PRE:  Rate/Rhythm: 105 ST  BP:  Supine: 117/48  Sitting:   Standing:    SaO2: 93-94% 1L  MODE:  Ambulation: 350 ft   POST:  Rate/Rhythm: 121 ST  BP:  Supine:   Sitting: 114/56  Standing:    SaO2: 94%RA hall, 92-94% room [phone] Assisted to bathroom and then he walked 350 ft on RA with rolling walker and asst x 1 with steady gait. Tolerated walk well. Checked RA sat in hallway at 94%RA. To recliner after walk with call bell. Left off oxygen. Encouraged two more walks today with staff.   Graylon Good, RN BSN  02/16/2014 9:05 AM

## 2014-02-16 NOTE — Progress Notes (Signed)
   Post CABG Agree with beta blocker (watch BP)   Will follow   Candee Furbish, MD

## 2014-02-16 NOTE — Progress Notes (Signed)
Pt ambulated approximately 250 ft with assist x1 and RW.  Pt tolerated well.  Pt back to bed with call bell in reach.  Will continue to monitor.

## 2014-02-16 NOTE — Progress Notes (Addendum)
[address]       [place] [address]             [phone]      3 Days Post-Op Procedure(s) (LRB): CORONARY ARTERY BYPASS GRAFTING (CABG) TIMES FOUR USING LEFT INTERNAL MAMMARY ARTERY AND RIGHT SAPHENOUS LEG VEIN HARVESTED ENDOSCOPICALLY (N/A) INTRAOPERATIVE TRANSESOPHAGEAL ECHOCARDIOGRAM (N/A) Subjective: Feels ok, minimal pain with cough  Objective: Vital signs in last 24 hours: Temp:  [98.2 F (36.8 C)-98.3 F (36.8 C)] 98.2 F (36.8 C) (10/03 0449) Pulse Rate:  [88-105] 94 (10/03 0449) Cardiac Rhythm:  [-] Normal sinus rhythm;Sinus tachycardia (10/03 0840) Resp:  [16-27] 19 (10/03 0449) BP: (95-128)/(55-68) 95/58 mmHg (10/03 0449) SpO2:  [94 %-100 %] 95 % (10/03 0758) Weight:  [151 lb 1.6 oz (68.539 kg)] 151 lb 1.6 oz (68.539 kg) (10/03 0449)  Hemodynamic parameters for last 24 hours:    Intake/Output from previous day: 10/02 0701 - 10/03 0700 In: 340 [P.O.:300; I.V.:40] Out: 1255 [Urine:1255] Intake/Output this shift:    General appearance: alert, cooperative and no distress Heart: regular rate and rhythm Lungs: somewhat coarse throughout Abdomen: soft, nontender Extremities: minor edema Wound: incis healing well  Lab Results:  Recent Labs  02/15/14 0430 02/16/14 0401  WBC 19.5* 20.8*  HGB 8.0* 9.5*  HCT 23.5* 27.8*  PLT 169 230   BMET:  Recent Labs  02/15/14 0430 02/16/14 0401  NA 136* 138  K 4.6 5.0  CL 101 101  CO2 25 24  GLUCOSE 127* 119*  BUN 17 13  CREATININE 0.80 0.86  CALCIUM 7.8* 8.6    PT/INR:  Recent Labs  02/13/14 1400  LABPROT 16.4*  INR 1.32   ABG    Component Value Date/Time   PHART 7.391 02/14/2014 0311   HCO3 21.8 02/14/2014 0311   TCO2 25 02/14/2014 1651   ACIDBASEDEF 2.2* 02/14/2014 0311   O2SAT 96.7 02/14/2014 0311   CBG (last 3)   Recent Labs  02/15/14 1704 02/15/14 2201 02/16/14 0623  GLUCAP 174* 145* 116*    Meds Scheduled Meds: . acetaminophen  1,000 mg Oral 4 times per  day  . aspirin EC  325 mg Oral Daily  . atorvastatin  40 mg Oral QHS  . bisacodyl  10 mg Oral Daily   Or  . bisacodyl  10 mg Rectal Daily  . docusate sodium  200 mg Oral Daily  . fluticasone  2 spray Each Nare QHS  . furosemide  40 mg Oral Daily  . guaiFENesin  600 mg Oral BID  . insulin aspart  0-24 Units Subcutaneous TID AC & HS  . insulin detemir  10 Units Subcutaneous Daily  . levalbuterol  0.63 mg Nebulization TID  . metoprolol tartrate  12.5 mg Oral BID  . mometasone-formoterol  2 puff Inhalation QHS  . moving right along book   Does not apply Once  . pantoprazole  40 mg Oral Daily  . potassium chloride  20 mEq Oral Daily  . sodium chloride  3 mL Intravenous Q12H  . sodium chloride  3 mL Intravenous Q12H   Continuous Infusions: . sodium chloride Stopped (02/15/14 1000)   PRN Meds:.sodium chloride, metoprolol, morphine injection, ondansetron (ZOFRAN) IV, oxyCODONE, sodium chloride, sodium chloride, traMADol  Xrays Dg Chest 2 View  02/16/2014   CLINICAL DATA:  Follow up atelectasis. Shortness of breath status post open heart surgery.  EXAM: CHEST  2 VIEW  COMPARISON:  02/15/2014  FINDINGS: Sequelae of CABG are again  identified. Mediastinal drain, chest tube, and right jugular sheath have been removed. Cardiac silhouette is mildly enlarged. The patient has taken a greater inspiration than on the prior study with improved aeration of the left lung base. There are small bilateral pleural effusions. Left apical pleural thickening is unchanged. No pneumothorax is identified.  IMPRESSION: Improved inspiration with improved aeration of the left lung base. Small bilateral pleural effusions.   Electronically Signed   By: Logan Bores   On: 02/16/2014 08:21   Dg Chest Port 1 View  02/15/2014   CLINICAL DATA:  CABG.  Pain.  EXAM: PORTABLE CHEST - 1 VIEW  COMPARISON:  02/14/2014.  FINDINGS: Interim removal Swan-Ganz catheter. Right IJ sheath in good anatomic position. Mediastinal drainage  catheter in stable position. Left chest tube in stable position. What appears to be a right chest tube in stable position. No pneumothorax. Stable left apical thickening of. Stable small left pleural effusion. Mild left upper and lower lobe subsegmental atelectasis and or infiltrate. Prior CABG. Stable mild cardiomegaly.Pulmonary vascularity is normal. No acute osseous abnormality.  IMPRESSION: 1. Interim removal of Swan-Ganz catheter. Right IJ sheath is in good anatomic position. Remaining support apparatus in stable position. 2. Stable left apical pleural thickening. Stable small left pleural effusion. 3. Mild left upper lobe and left lower lobe subsegmental atelectasis and or infiltrate .   Electronically Signed   By: Marcello Moores  Register   On: 02/15/2014 08:09    Assessment/Plan: S/P Procedure(s) (LRB): CORONARY ARTERY BYPASS GRAFTING (CABG) TIMES FOUR USING LEFT INTERNAL MAMMARY ARTERY AND RIGHT SAPHENOUS LEG VEIN HARVESTED ENDOSCOPICALLY (N/A) INTRAOPERATIVE TRANSESOPHAGEAL ECHOCARDIOGRAM (N/A)  1 steady progress 2 Cont aggressive pulm toilet/RX 3 cont gentle diuresis 4 H/H improved, renal fxn stable 5 sugars adeq controlled, d/c insulin soon  6 will increase beta blocker a bit as he is tachy at times 7 Cont rehab    LOS: 4 days    GOLD,WAYNE E 02/16/2014  Poss home Monday I have seen and examined [name] and agree with the above assessment  and plan.  Grace Isaac MD Beeper [phone] Office [phone] 02/16/2014 12:26 PM

## 2014-02-17 LAB — GLUCOSE, CAPILLARY
GLUCOSE-CAPILLARY: 119 mg/dL — AB (ref 70–99)
Glucose-Capillary: 126 mg/dL — ABNORMAL HIGH (ref 70–99)
Glucose-Capillary: 145 mg/dL — ABNORMAL HIGH (ref 70–99)

## 2014-02-17 MED ORDER — METOPROLOL TARTRATE 25 MG PO TABS
37.5000 mg | ORAL_TABLET | Freq: Two times a day (BID) | ORAL | Status: DC
  Administered 2014-02-17 – 2014-02-18 (×2): 37.5 mg via ORAL
  Filled 2014-02-17 (×3): qty 1

## 2014-02-17 MED ORDER — LEVALBUTEROL HCL 0.63 MG/3ML IN NEBU: 0.6300 mg | INHALATION_SOLUTION | Freq: Four times a day (QID) | RESPIRATORY_TRACT | Status: DC | PRN

## 2014-02-17 MED ORDER — MOVING RIGHT ALONG BOOK
Freq: Once | Status: DC
  Filled 2014-02-17: qty 1

## 2014-02-17 NOTE — Progress Notes (Addendum)
[address]       [place] [address]             [phone]      4 Days Post-Op Procedure(s) (LRB): CORONARY ARTERY BYPASS GRAFTING (CABG) TIMES FOUR USING LEFT INTERNAL MAMMARY ARTERY AND RIGHT SAPHENOUS LEG VEIN HARVESTED ENDOSCOPICALLY (N/A) INTRAOPERATIVE TRANSESOPHAGEAL ECHOCARDIOGRAM (N/A) Subjective: Looks and feels pretty well overall, some sternal discomfort with cough  Objective: Vital signs in last 24 hours: Temp:  [98.3 F (36.8 C)-98.7 F (37.1 C)] 98.5 F (36.9 C) (10/04 0615) Pulse Rate:  [97-104] 104 (10/04 0615) Cardiac Rhythm:  [-] Normal sinus rhythm;Sinus tachycardia (10/03 1935) Resp:  [18-19] 18 (10/04 0615) BP: (100-131)/(52-67) 131/67 mmHg (10/04 0615) SpO2:  [93 %-95 %] 95 % (10/04 0820) Weight:  [148 lb 9.6 oz (67.405 kg)] 148 lb 9.6 oz (67.405 kg) (10/04 0615)  Hemodynamic parameters for last 24 hours:    Intake/Output from previous day: 10/03 0701 - 10/04 0700 In: 240 [P.O.:240] Out: -  Intake/Output this shift:    General appearance: alert, cooperative and no distress Heart: regular rate and rhythm and tachy Lungs: clear to auscultation bilaterally Abdomen: benign Extremities: no edema Wound: incis healing well  Lab Results:  Recent Labs  02/15/14 0430 02/16/14 0401  WBC 19.5* 20.8*  HGB 8.0* 9.5*  HCT 23.5* 27.8*  PLT 169 230   BMET:  Recent Labs  02/15/14 0430 02/16/14 0401  NA 136* 138  K 4.6 5.0  CL 101 101  CO2 25 24  GLUCOSE 127* 119*  BUN 17 13  CREATININE 0.80 0.86  CALCIUM 7.8* 8.6    PT/INR: No results found for this basename: LABPROT, INR,  in the last 72 hours ABG    Component Value Date/Time   PHART 7.391 02/14/2014 0311   HCO3 21.8 02/14/2014 0311   TCO2 25 02/14/2014 1651   ACIDBASEDEF 2.2* 02/14/2014 0311   O2SAT 96.7 02/14/2014 0311   CBG (last 3)   Recent Labs  02/16/14 1611 02/16/14 2056 02/17/14 0617  GLUCAP 128* 131* 126*    Meds Scheduled Meds: .  acetaminophen  1,000 mg Oral 4 times per day  . aspirin EC  325 mg Oral Daily  . atorvastatin  40 mg Oral QHS  . bisacodyl  10 mg Oral Daily   Or  . bisacodyl  10 mg Rectal Daily  . docusate sodium  200 mg Oral Daily  . fluticasone  2 spray Each Nare QHS  . furosemide  40 mg Oral Daily  . guaiFENesin  600 mg Oral BID  . insulin aspart  0-24 Units Subcutaneous TID AC & HS  . insulin detemir  10 Units Subcutaneous Daily  . metoprolol tartrate  25 mg Oral BID  . mometasone-formoterol  2 puff Inhalation QHS  . moving right along book   Does not apply Once  . pantoprazole  40 mg Oral Daily  . potassium chloride  20 mEq Oral Daily  . sodium chloride  3 mL Intravenous Q12H  . sodium chloride  3 mL Intravenous Q12H   Continuous Infusions: . sodium chloride Stopped (02/15/14 1000)   PRN Meds:.sodium chloride, levalbuterol, metoprolol, morphine injection, ondansetron (ZOFRAN) IV, oxyCODONE, sodium chloride, sodium chloride, traMADol  Xrays Dg Chest 2 View  02/16/2014   CLINICAL DATA:  Follow up atelectasis. Shortness of breath status post open heart surgery.  EXAM: CHEST  2 VIEW  COMPARISON:  02/15/2014  FINDINGS: Sequelae of CABG are again identified.  Mediastinal drain, chest tube, and right jugular sheath have been removed. Cardiac silhouette is mildly enlarged. The patient has taken a greater inspiration than on the prior study with improved aeration of the left lung base. There are small bilateral pleural effusions. Left apical pleural thickening is unchanged. No pneumothorax is identified.  IMPRESSION: Improved inspiration with improved aeration of the left lung base. Small bilateral pleural effusions.   Electronically Signed   By: Logan Bores   On: 02/16/2014 08:21    Assessment/Plan: S/P Procedure(s) (LRB): CORONARY ARTERY BYPASS GRAFTING (CABG) TIMES FOUR USING LEFT INTERNAL MAMMARY ARTERY AND RIGHT SAPHENOUS LEG VEIN HARVESTED ENDOSCOPICALLY (N/A) INTRAOPERATIVE TRANSESOPHAGEAL  ECHOCARDIOGRAM (N/A)  1 conts to do well 2 leukocytosis persists, no fevers  3 tachy at times, BP should tol increase in beta blocker 4 sugars well controlled, HBG a1c 6.1- d/c insulin detimir- will need close outpatient follow up 5 push rehab/pulm toilet per routine 6 poss discharge 1-2 days   LOS: 5 days    GOLD,WAYNE E 02/17/2014  Doing well Pink tomorrow I have seen and examined Melvin Dunn and agree with the above assessment  and plan.  Grace Isaac MD Beeper [phone] Office [phone] 02/17/2014 11:49 AM

## 2014-02-17 NOTE — Progress Notes (Signed)
02/17/2014 8:05 PM Progress Notes The patient ambulated about 500 feet in the hall way this evening with a front wheel walker. The patient tolerated the activity well, with no pain or shortness of breath. Will continue to monitor and encourage activity as tolerated. Lupita Dawn

## 2014-02-17 NOTE — Discharge Summary (Signed)
Physician Discharge Summary  Patient ID: BHAVESH VAZQUEZ MRN: [id number] DOB/AGE: 64-Jun-1952 64 y.o.  Admit date: 02/12/2014 Discharge date: 02/17/2014  Admission Diagnoses:Severe Left Main Disease 3-vessel Coronary Artery Disease Stable Angina Pectoris History of Present Illness:  Melvin Dunn is a 63 y.o. male with a history of dyslipidemia, prediabetes and asthma who presents  for evaluation of SOB and chest pain. He recently saw Dr. [name] and complained pressure in his chest when he would exert himself. He has also noticed SOB with exertion that is different from his asthma. He denies any nausea but occasionally will have some diaphoresis with the pain. EKG shows NSR at baseline. He was felt to require cardiac catheterization and was admitted for the procedure.   Past Medical History   Diagnosis  Date   .  Environmental allergies    .  Asthma    .  Hyperlipidemia    .  Colon polyp    .  Adenocarcinoma      of the prostate, 5% of single core 01/2011-watching and waiting for now   .  Kidney stone    .  Prediabetes    .  Hyperlipemia    .  Type II or unspecified type diabetes mellitus without mention of complication, not stated as uncontrolled, borderline    .  Aortic stenosis  02/11/2014   .  Left main coronary artery disease  02/12/2014   .  Coronary artery disease involving native coronary artery with angina pectoris  02/12/2014    History reviewed. No pertinent past surgical history.  Family History   Problem  Relation  Age of Onset   .  Alzheimer's disease  Mother    .  Cancer - Lung  Father     History    Social History   .  Marital Status:  Single     Spouse Name:  N/A     Number of Children:  N/A   .  Years of Education:  N/A    Occupational History   .  Not on file.    Social History Main Topics   .  Smoking status:  Never Smoker   .  Smokeless tobacco:  Not on file   .  Alcohol Use:  No   .  Drug Use:  No   .  Sexual Activity:  Not on file    Other Topics   Concern   .  Not on file    Social History Narrative   .  No narrative on file    Prior to Admission medications   Medication  Sig  Start Date  End Date  Taking?  Authorizing Provider   albuterol (PROAIR HFA) 108 (90 BASE) MCG/ACT inhaler  Inhale 1-2 puffs into the lungs daily as needed for wheezing or shortness of breath.    Yes  Historical Provider, MD   aspirin EC 325 MG tablet  Take 1 tablet (325 mg total) by mouth daily.  02/11/14   Yes  Sherren Mocha, MD   atorvastatin (LIPITOR) 40 MG tablet  Take 40 mg by mouth at bedtime.  01/18/14   Yes  Historical Provider, MD   fluticasone (FLONASE) 50 MCG/ACT nasal spray  Place 2 sprays into both nostrils at bedtime.  01/16/14   Yes  Historical Provider, MD   mometasone-formoterol (DULERA) 200-5 MCG/ACT AERO  Inhale 2 puffs into the lungs at bedtime.    Yes  Historical Provider, MD   Multiple Vitamin (MULTIVITAMIN WITH MINERALS)  TABS tablet  Take 1 tablet by mouth daily.    Yes  Historical Provider, MD   nitroGLYCERIN (NITROSTAT) 0.4 MG SL tablet  Place 0.4 mg under the tongue every 5 (five) minutes as needed for chest pain.     Historical Provider, MD      Discharge Diagnoses:  Principal Problem:   S/P CABG x 4 Active Problems:   Hyperlipemia   Other and unspecified angina pectoris   Type II or unspecified type diabetes mellitus without mention of complication, not stated as uncontrolled, borderline   Aortic stenosis   Left main coronary artery disease   Coronary artery disease involving native coronary artery with angina pectoris  Patient Active Problem List   Diagnosis Date Noted  . S/P CABG x 4 02/13/2014  . Other and unspecified angina pectoris 02/12/2014  . Type II or unspecified type diabetes mellitus without mention of complication, not stated as uncontrolled, borderline 02/12/2014  . Left main coronary artery disease 02/12/2014  . Coronary artery disease involving native coronary artery with angina pectoris 02/12/2014  . Aortic  stenosis 02/11/2014  . Hyperlipemia 01/29/2014  . Chest tightness 01/29/2014  . SOB (shortness of breath) 01/29/2014  . Heart murmur 01/29/2014   Problem List Items Addressed This Visit   None    Visit Diagnoses   Atherosclerosis of native coronary artery of native heart without angina pectoris    -  Primary    Relevant Medications       nitroGLYCERIN (NITROSTAT) 0.4 MG SL tablet       aspirin chewable tablet 81 mg (Completed)       nitroGLYCERIN 100 MCG/ML intra-arterial injection (Completed)       heparin 2-0.9 UNIT/ML-% infusion (Completed)       verapamil (ISOPTIN) 2.5 MG/ML injection (Completed)       heparin 1000 UNIT/ML injection (Completed)       heparin 2-0.9 UNIT/ML-% infusion (Completed)       metoprolol tartrate (LOPRESSOR) tablet 12.5 mg (Completed)       nitroGLYCERIN 50 mg in dextrose 5 % 250 mL (0.2 mg/mL) infusion (Completed)       metoprolol (LOPRESSOR) injection 2.5-5 mg       furosemide (LASIX) tablet 40 mg (Completed)       aspirin EC tablet 325 mg       atorvastatin (LIPITOR) tablet 40 mg    Other Relevant Orders       Doppler pre cabg (Completed)       Discharged Condition: good  Hospital Course: The patient was admitted in underwent cardiac catheterization on 02/12/2014 with the following results noted: Cardiac Catheterization Procedure Note  Name: GENARO BEKKER  MRN: [id number]  DOB: 1951-03-18  Procedure: Left Heart Cath, Selective Coronary Angiography, LV angiography  Indication: Progressive exertional angina, now CCS Class 3 symptoms, high-risk Myoview  Procedural Details: The right wrist was prepped, draped, and anesthetized with 1% lidocaine. Using the modified Seldinger technique, a 5/6 French Slender sheath was introduced into the right radial artery. 3 mg of verapamil was administered through the sheath, weight-based unfractionated heparin was administered intravenously. Standard Judkins catheters were used for selective coronary angiography and  left ventriculography. Catheter exchanges were performed over an exchange length guidewire. There were no immediate procedural complications. A TR band was used for radial hemostasis at the completion of the procedure. The patient was transferred to the post catheterization recovery area for further monitoring.  Procedural Findings:  Hemodynamics:  AO 123/65  LV 125/7  Coronary angiography:  Coronary dominance: right  Left mainstem: The left main is heavily calcified. The mid to distal left main have 95% critical stenosis. The proximal left main is patent. The vessel divides into the LAD and left circumflex.  Left anterior descending (LAD): The left main lesion appears to extend into the ostium and proximal portion of the LAD with 90% associated stenosis at the ostium of the LAD. The proximal LAD at the first diagonal branch has 50% stenosis. The mid LAD is patent with minor diffuse irregularity. The diagonal branches are patent.  Left circumflex (LCx): The left circumflex is patent. There is nonobstructive disease present. The first obtuse marginal branch has 40-50% ostial stenosis. This vessel is medium in caliber. The second OM is smaller in caliber without significant disease noted. The AV circumflex beyond the second OM is very small.  Right coronary artery (RCA): This is a dominant, heavily calcified vessel. The mid vessel has 95% stenosis. At the junction of the mid and distal vessel, there is another 95% stenosis. The PDA is patent. The PLA branch has a 95% stenosis.  Left ventriculography: Left ventricular systolic function is normal, LVEF is estimated at 65%, there is no significant mitral regurgitation  Contrast: 50 cc Omnipaque  Estimated Blood Loss: Minimal  Final Conclusions:  1. Critical left main and multivessel CAD  2. Normal LV systolic function with normal LVEDP  Recommendations: The patient will require urgent CABG. Will start him on IV heparin. He is pain-free at rest and in  fact has had no resting chest discomfort. However, he has had progressive symptoms with lower levels of exertion over the past few weeks. He will be admitted, started on IV heparin, and monitored carefully until CABG.  Sherren Mocha MD, [facility]  02/12/2014, 3:43 PM   Due to these findings cardiothoracic surgical consultation was obtained with Darylene Price M.D. who evaluated the patient and his studies and agreed with recommendations to proceed with surgical coronary artery revascularization.  Treatment:  CARDIOTHORACIC SURGERY OPERATIVE NOTE  Date of Procedure: 02/13/2014  Preoperative Diagnosis:  Severe Left Main Disease  3-vessel Coronary Artery Disease  Stable Angina Pectoris Postoperative Diagnosis: Same  Procedure:  Coronary Artery Bypass Grafting x 4  Left Internal Mammary Artery to Distal Left Anterior Descending Coronary Artery  Saphenous Vein Graft to First Obtuse Marginal Branch of Left Circumflex Coronary Artery  Saphenous Vein Graft to Distal Right Coronary Artery with  Sequential Sapheonous Vein Graft to Right Posterolateral Branch Coronary Artery  Endoscopic Vein Harvest from Right Thigh and Lower Leg  Surgeon: Valentina Gu. Roxy Manns, MD  Assistant: Suzzanne Cloud, PA-C  Anesthesia: Duane Boston, MD  Operative Findings:  Tricuspid aortic valve with thin, normally moving leaflets (all 3)  Mild aortic stenosis caused by mild sclerosis at all 3 commissures  Normal LV systolic function  Small caliber but otherwise good quality LIMA conduit for grafting  Good quality SVG conduit for grafting  Relatively small coronary arteries with diffuse CAD  Postoperative hospital course: The patient has overall progressed nicely. He was weaned from the ventilator without difficulty. He was weaned from inotropic support and has maintained sinus rhythm with stable hemodynamics. He does have some tachycardia and beta blocker is being titrated. He does have a stable leukocytosis felt most likely to be  SIRS . He is not having fevers. His blood sugars have been under adequate control. He does have an hemoglobin A1c of 6.1 and will require  close followup as an outpatient. Incisions are noted  to be healing well without evidence of infection. He is tolerating gradually increasing activities using standard cardiac rehabilitation protocols. He has been weaned from oxygen and maintained good saturations on room air. His overall status is felt to be tentatively stable for discharge in the next 24-48 hours pending ongoing reevaluation of his recovery.   Discharge Exam: Blood pressure 131/67, pulse 104, temperature 98.5 F (36.9 C), temperature source Oral, resp. rate 18, height 5\' 8"  (1.727 m), weight 148 lb 9.6 oz (67.405 kg), SpO2 95.00%. General appearance: alert, cooperative and no distress  Heart: regular rate and rhythm  Lungs: clear to auscultation bilaterally  Abdomen: benign  Extremities: no edema  Wound: incis heling well      Disposition:  Medications at time of discharge:   Medication List    STOP taking these medications       nitroGLYCERIN 0.4 MG SL tablet  Commonly known as:  NITROSTAT      TAKE these medications       aspirin EC 325 MG tablet  Take 1 tablet (325 mg total) by mouth daily.     atorvastatin 40 MG tablet  Commonly known as:  LIPITOR  Take 40 mg by mouth at bedtime.     DULERA 200-5 MCG/ACT Aero  Generic drug:  mometasone-formoterol  Inhale 2 puffs into the lungs at bedtime.     fluticasone 50 MCG/ACT nasal spray  Commonly known as:  FLONASE  Place 2 sprays into both nostrils at bedtime.     metoprolol tartrate 12.5 mg Tabs tablet  Commonly known as:  LOPRESSOR  Take 1.5 tablets (37.5 mg total) by mouth 2 (two) times daily.     multivitamin with minerals Tabs tablet  Take 1 tablet by mouth daily.     oxyCODONE 5 MG immediate release tablet  Commonly known as:  Oxy IR/ROXICODONE  Take 1-2 tablets (5-10 mg total) by mouth every 4 (four) hours  as needed for severe pain.     PROAIR HFA 108 (90 BASE) MCG/ACT inhaler  Generic drug:  albuterol  Inhale 1-2 puffs into the lungs daily as needed for wheezing or shortness of breath.       The patient has been discharged on:   1.Beta Blocker:  Yes [  x ]                              No   [   ]                              If No, reason:  2.Ace Inhibitor/ARB: Yes [   ]                                     No  [ n   ]                                     If No, reason: BP too labile  3.Statin:   Yes [ x  ]                  No  [   ]  If No, reason:  4.Ecasa:  Yes  [ x  ]                  No   [   ]                  If No, reason:      Follow-up Information   Follow up with Rexene Alberts, MD. (The office will contact you. Please obtain a chest x-ray at [facility] 1 hour prior to this appointment. [facility] is located in the same office complex.)    Specialty:  Cardiothoracic Surgery   Contact information:   [address] [place] [address] [phone]       Follow up with Sueanne Margarita, MD. (Please obtain an appointment to see cardiologist in 2 weeks if has not already been arranged.)    Specialty:  Cardiology   Contact information:   [address] [place] [address] [phone]       Signed: Braidan Giovanni 02/17/2014, 11:38 AM

## 2014-02-17 NOTE — Discharge Instructions (Signed)
Endoscopic Saphenous Vein Harvesting °Care After °Refer to this sheet in the next few weeks. These instructions provide you with information on caring for yourself after your procedure. Your health care provider may also give you more specific instructions. Your treatment has been planned according to current medical practices, but problems sometimes occur. Call your health care provider if you have any problems or questions after your procedure. °HOME CARE INSTRUCTIONS °Medicine °· Take whatever pain medicine your surgeon prescribes. Follow the directions carefully. Do not take over-the-counter pain medicine unless your surgeon says it is okay. Some pain medicine can cause bleeding problems for several weeks after surgery. °· Follow your surgeon's instructions about driving. You will probably not be permitted to drive after heart surgery. °· Take any medicines your surgeon prescribes. Any medicines you took before your heart surgery should be checked with your health care provider before you start taking them again. °Wound care °· If your surgeon has prescribed an elastic bandage or stocking, ask how long you should wear it. °· Check the area around your surgical cuts (incisions) whenever your bandages (dressings) are changed. Look for any redness or swelling. °· You will need to return to have the stitches (sutures) or staples taken out. Ask your surgeon when to do that. °· Ask your surgeon when you can shower or bathe. °Activity °· Try to keep your legs raised when you are sitting. °· Do any exercises your health care providers have given you. These may include deep breathing exercises, coughing, walking, or other exercises. °SEEK MEDICAL CARE IF: °· You have any questions about your medicines. °· You have more leg pain, especially if your pain medicine stops working. °· New or growing bruises develop on your leg. °· Your leg swells, feels tight, or becomes red. °· You have numbness in your leg. °SEEK IMMEDIATE  MEDICAL CARE IF: °· Your pain gets much worse. °· Blood or fluid leaks from any of the incisions. °· Your incisions become warm, swollen, or red. °· You have chest pain. °· You have trouble breathing. °· You have a fever. °· You have more pain near your leg incision. °MAKE SURE YOU: °· Understand these instructions. °· Will watch your condition. °· Will get help right away if you are not doing well or get worse. °Document Released: 01/13/2011 Document Revised: 05/08/2013 Document Reviewed: 01/13/2011 °ExitCare® Patient Information ©2015 [employer]. This information is not intended to replace advice given to you by your health care provider. Make sure you discuss any questions you have with your health care provider. °Coronary Artery Bypass Grafting, Care After °These instructions give you information on caring for yourself after your procedure. Your doctor may also give you more specific instructions. Call your doctor if you have any problems or questions after your procedure.  °HOME CARE °· Only take medicine as told by your doctor. Take medicines exactly as told. Do not stop taking medicines or start any new medicines without talking to your doctor first. °· Take your pulse as told by your doctor. °· Do deep breathing as told by your doctor. Use your breathing device (incentive spirometer), if given, to practice deep breathing several times a day. Support your chest with a pillow or your arms when you take deep breaths or cough. °· Keep the area clean, dry, and protected where the surgery cuts (incisions) were made. Remove bandages (dressings) only as told by your doctor. If strips were applied to surgical area, do not take them off. They fall off   on their own. °· Check the surgery area daily for puffiness (swelling), redness, or leaking fluid. °· If surgery cuts were made in your legs: °· Avoid crossing your legs. °· Avoid sitting for long periods of time. Change positions every 30 minutes. °· Raise your legs  when you are sitting. Place them on pillows. °· Wear stockings that help keep blood clots from forming in your legs (compression stockings). °· Only take sponge baths until your doctor says it is okay to take showers. Pat the surgery area dry. Do not rub the surgery area with a washcloth or towel. Do not bathe, swim, or use a hot tub until your doctor says it is okay. °· Eat foods that are high in fiber. These include raw fruits and vegetables, whole grains, beans, and nuts. Choose lean meats. Avoid canned, processed, and fried foods. °· Drink enough fluids to keep your pee (urine) clear or pale yellow. °· Weigh yourself every day. °· Rest and limit activity as told by your doctor. You may be told to: °· Stop any activity if you have chest pain, shortness of breath, changes in heartbeat, or dizziness. Get help right away if this happens. °· Move around often for short amounts of time or take short walks as told by your doctor. Gradually become more active. You may need help to strengthen your muscles and build endurance. °· Avoid lifting, pushing, or pulling anything heavier than 10 pounds (4.5 kg) for at least 6 weeks after surgery. °· Do not drive until your doctor says it is okay. °· Ask your doctor when you can go back to work. °· Ask your doctor when you can begin sexual activity again. °· Follow up with your doctor as told. °GET HELP IF: °· You have puffiness, redness, more pain, or fluid draining from the incision site. °· You have a fever. °· You have puffiness in your ankles or legs. °· You have pain in your legs. °· You gain 2 or more pounds (0.9 kg) a day. °· You feel sick to your stomach (nauseous) or throw up (vomit). °· You have watery poop (diarrhea). °GET HELP RIGHT AWAY IF: °· You have chest pain that goes to your jaw or arms. °· You have shortness of breath. °· You have a fast or irregular heartbeat. °· You notice a "clicking" in your breastbone when you move. °· You have numbness or weakness in  your arms or legs. °· You feel dizzy or light-headed. °MAKE SURE YOU: °· Understand these instructions. °· Will watch your condition. °· Will get help right away if you are not doing well or get worse. °Document Released: 05/08/2013 Document Reviewed: 05/08/2013 °ExitCare® Patient Information ©2015 [employer]. This information is not intended to replace advice given to you by your health care provider. Make sure you discuss any questions you have with your health care provider. ° °

## 2014-02-18 LAB — CBC
HCT: 27.5 % — ABNORMAL LOW (ref 39.0–52.0)
Hemoglobin: 9.1 g/dL — ABNORMAL LOW (ref 13.0–17.0)
MCH: 28 pg (ref 26.0–34.0)
MCHC: 33.1 g/dL (ref 30.0–36.0)
MCV: 84.6 fL (ref 78.0–100.0)
PLATELETS: 465 10*3/uL — AB (ref 150–400)
RBC: 3.25 MIL/uL — ABNORMAL LOW (ref 4.22–5.81)
RDW: 13.8 % (ref 11.5–15.5)
WBC: 19.7 10*3/uL — ABNORMAL HIGH (ref 4.0–10.5)

## 2014-02-18 LAB — BASIC METABOLIC PANEL
Anion gap: 13 (ref 5–15)
BUN: 16 mg/dL (ref 6–23)
CALCIUM: 9.1 mg/dL (ref 8.4–10.5)
CO2: 24 mEq/L (ref 19–32)
Chloride: 101 mEq/L (ref 96–112)
Creatinine, Ser: 0.8 mg/dL (ref 0.50–1.35)
GFR calc Af Amer: >90 mL/min (ref >90)
GLUCOSE: 111 mg/dL — AB (ref 70–99)
Potassium: 5.4 mEq/L — ABNORMAL HIGH (ref 3.7–5.3)
SODIUM: 138 meq/L (ref 137–147)

## 2014-02-18 LAB — GLUCOSE, CAPILLARY
Glucose-Capillary: 113 mg/dL — ABNORMAL HIGH (ref 70–99)
Glucose-Capillary: 114 mg/dL — ABNORMAL HIGH (ref 70–99)

## 2014-02-18 MED ORDER — LEVOFLOXACIN 500 MG PO TABS: 500.0000 mg | ORAL_TABLET | Freq: Every day | ORAL | Status: DC

## 2014-02-18 MED ORDER — LEVOFLOXACIN 500 MG PO TABS
500.0000 mg | ORAL_TABLET | Freq: Every day | ORAL | Status: DC
  Administered 2014-02-18: 500 mg via ORAL
  Filled 2014-02-18: qty 1

## 2014-02-18 MED ORDER — METOPROLOL TARTRATE 12.5 MG HALF TABLET: 37.5000 mg | ORAL_TABLET | Freq: Two times a day (BID) | ORAL | Status: DC

## 2014-02-18 MED ORDER — OXYCODONE HCL 5 MG PO TABS: 5.0000 mg | ORAL_TABLET | ORAL | Status: DC | PRN

## 2014-02-18 NOTE — Progress Notes (Signed)
02/18/2014 2:05 PM Nursing note Discharge avs form, medications already taken today and those due this evening given and explained to patient and family. Incision site care, follow up appointments, when to call MD and activity restrictions reviewed. Pt. States he has already been given moving right along book as well as watched d/c video. Questions and concerns addressed. D/c iv line. D/c tele. D/c home with family per orders.  Jillisa Harris, Arville Lime

## 2014-02-18 NOTE — Progress Notes (Signed)
02/18/2014 1030 epw d/c per order and per protocol. Pt. Tolerated well. Ends intact. Advised br for one hour post removal. VSS and collected per protocol. Call bell within reach. Will continue to monitor patient. Ct sutures removed and benzoin and steri strips applied per protocol.  Jarelis Ehlert, Arville Lime

## 2014-02-18 NOTE — Progress Notes (Cosign Needed)
Melvin Dunn Pt walked earlier and does not want to walk at this time. Pt does not think he will need rolling walker for home use. Education completed with pt who voiced understanding. Wife is not coming up today so pt okay with doing education with just him. Reviewed heart healthy and carb counting diets. Encouraged IS,flutter valve and walking for pulmonary status. Discussed CRP 2 and pt gave permission to refer to [facility] program. Put on discharge video for pt to view. Graylon Good RN BSN 02/18/2014 9:14 AM

## 2014-02-18 NOTE — Progress Notes (Addendum)
[address]       [place] [address]             [phone]      5 Days Post-Op Procedure(s) (LRB): CORONARY ARTERY BYPASS GRAFTING (CABG) TIMES FOUR USING LEFT INTERNAL MAMMARY ARTERY AND RIGHT SAPHENOUS LEG VEIN HARVESTED ENDOSCOPICALLY (N/A) INTRAOPERATIVE TRANSESOPHAGEAL ECHOCARDIOGRAM (N/A) Subjective: conts to do well  Objective: Vital signs in last 24 hours: Temp:  [98.1 F (36.7 C)-98.7 F (37.1 C)] 98.1 F (36.7 C) (10/05 0554) Pulse Rate:  [96-110] 98 (10/05 0554) Cardiac Rhythm:  [-] Normal sinus rhythm (10/04 2000) Resp:  [18-20] 20 (10/05 0554) BP: (122-142)/(65-73) 142/73 mmHg (10/05 0554) SpO2:  [95 %-96 %] 96 % (10/05 0554) Weight:  [145 lb 8 oz (65.998 kg)] 145 lb 8 oz (65.998 kg) (10/05 0554)  Hemodynamic parameters for last 24 hours:    Intake/Output from previous day: 10/04 0701 - 10/05 0700 In: 120 [P.O.:120] Out: -  Intake/Output this shift:    General appearance: alert, cooperative and no distress Heart: regular rate and rhythm Lungs: clear to auscultation bilaterally Abdomen: benign Extremities: no edema Wound: incis heling well  Lab Results:  Recent Labs  02/16/14 0401  WBC 20.8*  HGB 9.5*  HCT 27.8*  PLT 230   BMET:  Recent Labs  02/16/14 0401  NA 138  K 5.0  CL 101  CO2 24  GLUCOSE 119*  BUN 13  CREATININE 0.86  CALCIUM 8.6    PT/INR: No results found for this basename: LABPROT, INR,  in the last 72 hours ABG    Component Value Date/Time   PHART 7.391 02/14/2014 0311   HCO3 21.8 02/14/2014 0311   TCO2 25 02/14/2014 1651   ACIDBASEDEF 2.2* 02/14/2014 0311   O2SAT 96.7 02/14/2014 0311   CBG (last 3)   Recent Labs  02/17/14 1122 02/17/14 2145 02/18/14 0601  GLUCAP 119* 145* 113*    Meds Scheduled Meds: . acetaminophen  1,000 mg Oral 4 times per day  . aspirin EC  325 mg Oral Daily  . atorvastatin  40 mg Oral QHS  . bisacodyl  10 mg Oral Daily   Or  . bisacodyl  10 mg Rectal Daily    . docusate sodium  200 mg Oral Daily  . fluticasone  2 spray Each Nare QHS  . guaiFENesin  600 mg Oral BID  . insulin aspart  0-24 Units Subcutaneous TID AC & HS  . metoprolol tartrate  37.5 mg Oral BID  . mometasone-formoterol  2 puff Inhalation QHS  . moving right along book   Does not apply Once  . pantoprazole  40 mg Oral Daily  . sodium chloride  3 mL Intravenous Q12H  . sodium chloride  3 mL Intravenous Q12H   Continuous Infusions: . sodium chloride Stopped (02/15/14 1000)   PRN Meds:.sodium chloride, levalbuterol, metoprolol, morphine injection, ondansetron (ZOFRAN) IV, oxyCODONE, sodium chloride, sodium chloride, traMADol  Xrays No results found.  Assessment/Plan: S/P Procedure(s) (LRB): CORONARY ARTERY BYPASS GRAFTING (CABG) TIMES FOUR USING LEFT INTERNAL MAMMARY ARTERY AND RIGHT SAPHENOUS LEG VEIN HARVESTED ENDOSCOPICALLY (N/A) INTRAOPERATIVE TRANSESOPHAGEAL ECHOCARDIOGRAM (N/A) d/c pacing wires Plan for discharge: see discharge orders   LOS: 6 days    GOLD,WAYNE E 02/18/2014  I have seen and examined the patient and agree with the assessment and plan as outlined.  Doing well.  Will recheck CBC to make sure WBC trending down.  Plan d/c home today.  OWEN,CLARENCE H 02/18/2014  8:39 AM

## 2014-02-19 NOTE — Care Management Note (Signed)
    Page 1 of 1   02/19/2014     5:00:52 PM CARE MANAGEMENT NOTE 02/19/2014  Patient:  LERAY, GARVERICK   Account Number:  [id number]  Date Initiated:  02/18/2014  Documentation initiated by:  Madason Rauls  Subjective/Objective Assessment:   pt s/p CABG x 4 on 9/29.  PTA, pt independent, lives with spuse.     Action/Plan:   Wife to provide care at dc.  No home needs identified.   Anticipated DC Date:  02/18/2014   Anticipated DC Plan:  [place]  CM consult      Choice offered to / List presented to:             Status of service:  Completed, signed off Medicare Important Message given?   (If response is "NO", the following Medicare IM given date fields will be blank) Date Medicare IM given:   Medicare IM given by:   Date Additional Medicare IM given:   Additional Medicare IM given by:    Discharge Disposition:  HOME/SELF CARE  Per UR Regulation:  Reviewed for med. necessity/level of care/duration of stay  If discussed at [place] of Stay Meetings, dates discussed:    Comments:

## 2014-03-08 ENCOUNTER — Ambulatory Visit (INDEPENDENT_AMBULATORY_CARE_PROVIDER_SITE_OTHER): Payer: BC Managed Care – PPO | Admitting: Physician Assistant

## 2014-03-08 ENCOUNTER — Encounter: Payer: Self-pay | Admitting: Physician Assistant

## 2014-03-08 VITALS — BP 120/70 | HR 72 | Ht 68.0 in | Wt 143.0 lb

## 2014-03-08 DIAGNOSIS — I2510 Atherosclerotic heart disease of native coronary artery without angina pectoris: Secondary | ICD-10-CM

## 2014-03-08 DIAGNOSIS — E785 Hyperlipidemia, unspecified: Secondary | ICD-10-CM

## 2014-03-08 DIAGNOSIS — I350 Nonrheumatic aortic (valve) stenosis: Secondary | ICD-10-CM

## 2014-03-08 NOTE — Patient Instructions (Signed)
Your physician recommends that you schedule a follow-up appointment in:  6-8 weeks with Dr. Radford Pax.

## 2014-03-08 NOTE — Progress Notes (Addendum)
Cardiology Office Note   Date:  03/08/2014   ID:  Melvin Dunn, DOB Oct 24, 1950, MRN [id number]  PCP:  Shirline Frees, MD  Cardiologist:  Dr. Fransico Him     History of Present Illness: Melvin Dunn is a 63 y.o. male with a hx of pre-diabetes, asthma, HL.  He was seen last month by Dr. Radford Pax for the evaluation of chest pain and dyspnea.  Nuclear study was high risk and he was set up for a heart cath.  LHC demonstrated critical LM and multivessel CAD.  He was admitted for surgical consultation.  He underwent CABG x 4 with Dr. Roxy Manns (L-LAD, S-OM1, S-dist RCA, S-R PLA).  Post op course was fairly uneventful. He remained in NSR.  He returns for FU.  Since DC, he is doing well.  He is walking about 15 minutes a day now. He denies significant dyspnea.  No orthopnea, PND, edema.  Chest soreness is improved.  He denies syncope or near syncope.  He denies fevers or chills.  He has had a cough that has improved since DC.  This is non-productive.    Studies:  - LHC (02/12/14):    Mid to dist LM 95% critical stenosis.  Ostial and prox LAD 90%, prox LAD at Dx 50%, ostial OM1 40-50%, mid RCA 95%, at junction of the mid and distal RCA 95%, PLA  95%  EF 65%   - Echo (02/11/14): EF 60% to 65%. Grade 1 diastolic dysfunction, AV with Peak and mean gradients through the valve  23 and 17 mm Hg respectively consistent with mild AS. Mitral valve: There was mild regurgitation.  - Nuclear (02/11/14):  High risk stress nuclear study. Inferoapical ischemia. There are marked ST segment depression up to 5 mm in inferolateral leads with stress.  LV Ejection Fraction: 51%.  - Carotid US (9/15):  Bilateral ICA 1-39%   Recent Labs/Images:   Recent Labs  02/12/14 1900  02/18/14 1023  NA 140  < > 138  K 3.8  < > 5.4*  BUN 16  < > 16  CREATININE 0.88  < > 0.80  ALT 31  --   --   HGB  --   < > 9.1*  < > = values in this interval not displayed.    Dg Chest Portable 1 View  02/13/2014  IMPRESSION: 1. Good  postoperative appearance postop day 0 status post CABG, with tubes and lines well positioned. Minimal bibasilar atelectasis, possible small amount of left pleural fluid. No pneumothorax.   Electronically Signed   By: Sherryl Barters M.D.   On: 02/13/2014 15:18     Wt Readings from Last 3 Encounters:  03/08/14 143 lb (64.864 kg)  02/18/14 145 lb 8 oz (65.998 kg)  02/18/14 145 lb 8 oz (65.998 kg)     Past Medical History  Diagnosis Date  . Environmental allergies   . Asthma   . Hyperlipidemia   . Colon polyp   . Adenocarcinoma     of the prostate, 5% of single core 01/2011-watching and waiting for now  . Kidney stone   . Prediabetes   . Hyperlipemia   . Type II or unspecified type diabetes mellitus without mention of complication, not stated as uncontrolled, borderline   . Aortic stenosis 02/11/2014  . Left main coronary artery disease 02/12/2014  . Coronary artery disease involving native coronary artery with angina pectoris 02/12/2014  . S/P CABG x 4 02/13/2014    LIMA to LAD, SVG  to OM1, SVG to RCA-RPL, EVH via right thigh and leg    Current Outpatient Prescriptions  Medication Sig Dispense Refill  . albuterol (PROAIR HFA) 108 (90 BASE) MCG/ACT inhaler Inhale 1-2 puffs into the lungs daily as needed for wheezing or shortness of breath.      Marland Kitchen aspirin EC 325 MG tablet Take 1 tablet (325 mg total) by mouth daily.  1 tablet  0  . atorvastatin (LIPITOR) 40 MG tablet Take 40 mg by mouth at bedtime.       . fluticasone (FLONASE) 50 MCG/ACT nasal spray Place 2 sprays into both nostrils at bedtime.       . metoprolol tartrate (LOPRESSOR) 12.5 mg TABS tablet Take 1.5 tablets (37.5 mg total) by mouth 2 (two) times daily.  180 each  1  . mometasone-formoterol (DULERA) 200-5 MCG/ACT AERO Inhale 2 puffs into the lungs at bedtime.      . Multiple Vitamin (MULTIVITAMIN WITH MINERALS) TABS tablet Take 1 tablet by mouth daily.      Marland Kitchen NITROSTAT 0.4 MG SL tablet Place 0.4 mg under the tongue every 5  (five) minutes as needed.       Marland Kitchen oxyCODONE (OXY IR/ROXICODONE) 5 MG immediate release tablet Take 1-2 tablets (5-10 mg total) by mouth every 4 (four) hours as needed for severe pain.  50 tablet  0   No current facility-administered medications for this visit.     Allergies:   Review of patient's allergies indicates no known allergies.   Social History:  The patient  reports that he has never smoked. He does not have any smokeless tobacco history on file. He reports that he does not drink alcohol or use illicit drugs.   Family History:  The patient's family history includes Alzheimer's disease in his mother; Cancer in his father; Cancer - Lung in his father; Heart attack in his maternal grandfather; Stroke in his mother.   ROS:  Please see the history of present illness.      All other systems reviewed and negative.    PHYSICAL EXAM: VS:  BP 120/70  Pulse 72  Ht 5\' 8"  (1.727 m)  Wt 143 lb (64.864 kg)  BMI 21.75 kg/m2 Well nourished, well developed, in no acute distress HEENT: normal Neck: no JVD Cardiac:  normal S1, S2; RRR; no murmur Chest: median sternotomy well healed without erythema or discharge Lungs:  clear to auscultation bilaterally, no wheezing, rhonchi or rales Abd: soft, nontender, no hepatomegaly Ext: no edema Skin: warm and dry Neuro:  CNs 2-12 intact, no focal abnormalities noted  EKG:  NSR HR 72, normal axis, no ST changes.      ASSESSMENT AND PLAN:  Coronary artery disease s/p CABG -  He is progressing well after recent multivessel CABG.  He will see Dr. Roxy Manns in less than 2 weeks.  He already has an appointment to start Cardiac Rehab.  Continue ASA, statin, beta blocker.  Hyperlipemia  -  Managed by primary care.  If his LDL is not < 70, would strongly consider increasing to high intensity statin (ie Lipitor 80 or Crestor 40).    Aortic stenosis -  Mild by recent echo.  Consider FU echo in 1 year.   Disposition:   FU with Dr. Fransico Him in 6-8 weeks.     Signed, Versie Starks, MHS 03/08/2014 12:15 PM    [facility] [address], [place], [place]  [address] Phone: [phone]; Fax: [phone]

## 2014-03-12 ENCOUNTER — Other Ambulatory Visit: Payer: Self-pay | Admitting: Thoracic Surgery (Cardiothoracic Vascular Surgery)

## 2014-03-12 DIAGNOSIS — Z951 Presence of aortocoronary bypass graft: Secondary | ICD-10-CM

## 2014-03-18 ENCOUNTER — Ambulatory Visit (INDEPENDENT_AMBULATORY_CARE_PROVIDER_SITE_OTHER): Payer: Self-pay | Admitting: Thoracic Surgery (Cardiothoracic Vascular Surgery)

## 2014-03-18 ENCOUNTER — Ambulatory Visit
Admission: RE | Admit: 2014-03-18 | Discharge: 2014-03-18 | Disposition: A | Discharge status: Home / Self Care | Payer: BC Managed Care – PPO | Source: Ambulatory Visit | Attending: Thoracic Surgery (Cardiothoracic Vascular Surgery) | Admitting: Thoracic Surgery (Cardiothoracic Vascular Surgery)

## 2014-03-18 ENCOUNTER — Encounter: Payer: Self-pay | Admitting: Thoracic Surgery (Cardiothoracic Vascular Surgery)

## 2014-03-18 VITALS — BP 132/79 | HR 75 | Resp 20 | Ht 68.0 in | Wt 149.0 lb

## 2014-03-18 DIAGNOSIS — Z951 Presence of aortocoronary bypass graft: Secondary | ICD-10-CM

## 2014-03-18 NOTE — Patient Instructions (Signed)
The patient may return to driving an automobile as long as they are no longer requiring oral narcotic pain relievers during the daytime.  It would be wise to start driving only short distances during the daylight and gradually increase from there as they feel comfortable.  The patient should continue to avoid any heavy lifting or strenuous use of arms or shoulders for at least a total of three months from the time of surgery.  The patient is encouraged to enroll and participate in the outpatient cardiac rehab program beginning as soon as practical.

## 2014-03-18 NOTE — Progress Notes (Signed)
[address]       [place] [address]             [phone]     CARDIOTHORACIC SURGERY OFFICE NOTE  Referring Provider is Sueanne Margarita, MD PCP is Shirline Frees, MD   HPI:  Patient returns for routine follow-up status post coronary artery bypass grafting 4 on 02/13/2014 for severe left main disease with three-vessel coronary artery disease and stable angina pectoris. Intraoperative findings were also notable for the presence of very mild aortic stenosis related to sclerosis involving all 3 commissures of an otherwise normal tricuspid aortic valve. The patient's postoperative recovery was entirely uncomplicated and he was discharged from the hospital on the 5th postoperative day. Since hospital discharge he has continued to do well. He has been seen in follow-up By Richardson Dopp at [facility] on 03/08/2014 and he returns for routine follow-up to our office today. He reports that he is doing quite well. He has mild residual soreness in his chest which is described primarily as a sensitivity or hyper-esthesia overlying the left anterior chest wall, typical for left internal mammary artery harvest and grafting. He has no exertional chest pain or shortness of breath. His activity level is quite good. He plans to start cardiac rehabilitation programs soon. He reports no problems or complaints.   Current Outpatient Prescriptions  Medication Sig Dispense Refill  . albuterol (PROAIR HFA) 108 (90 BASE) MCG/ACT inhaler Inhale 1-2 puffs into the lungs daily as needed for wheezing or shortness of breath.    Marland Kitchen aspirin EC 325 MG tablet Take 1 tablet (325 mg total) by mouth daily. 1 tablet 0  . atorvastatin (LIPITOR) 40 MG tablet Take 40 mg by mouth at bedtime.     . fluticasone (FLONASE) 50 MCG/ACT nasal spray Place 2 sprays into both nostrils at bedtime.     . metoprolol tartrate (LOPRESSOR) 12.5 mg TABS tablet Take 1.5 tablets (37.5 mg total) by mouth 2 (two) times daily.  180 each 1  . mometasone-formoterol (DULERA) 200-5 MCG/ACT AERO Inhale 2 puffs into the lungs at bedtime.    . Multiple Vitamin (MULTIVITAMIN WITH MINERALS) TABS tablet Take 1 tablet by mouth daily.    Marland Kitchen NITROSTAT 0.4 MG SL tablet Place 0.4 mg under the tongue every 5 (five) minutes as needed.     Marland Kitchen oxyCODONE (OXY IR/ROXICODONE) 5 MG immediate release tablet Take 1-2 tablets (5-10 mg total) by mouth every 4 (four) hours as needed for severe pain. 50 tablet 0   No current facility-administered medications for this visit.      Physical Exam:   BP 132/79 mmHg  Pulse 75  Resp 20  Ht 5\' 8"  (1.727 m)  Wt 149 lb (67.586 kg)  BMI 22.66 kg/m2  SpO2 96%  General:  Well-appearing  Chest:   Clear  CV:   Regular rate and rhythm with grade 3/6 systolic murmur heard best at left sternal border  Incisions:  Clean and dry and healing nicely, sternum is stable  Abdomen:  Soft and nontender  Extremities:  Warm and well-perfused  Diagnostic Tests:  CHEST 2 VIEW  COMPARISON: 02/16/2014.  FINDINGS: Trachea is midline. Heart size normal. Eight intact sternotomy wires are unchanged in position. Tiny residual left pleural effusion. Lungs are hyperinflated but otherwise clear. Biapical pleural thickening.  IMPRESSION: Tiny residual left pleural effusion.   Electronically Signed  By: Lorin Picket M.D.  On: 03/18/2014 14:08  Impression:  Patient is doing very  well proximally one month status post coronary artery bypass grafting. He has very mild aortic stenosis which will need long-term follow-up.   Plan:  I have encouraged patient to continue to gradually increase his physical activity as tolerated. I think he may resume driving an automobile, and I have encouraged him to go ahead and get started in the cardiac rehabilitation program. We have discussed a specific plan for his return to work. I think he may resume work part time with some physical limitations by December 1 with  plans to resume unrestricted full-time work after the first of the year.  We have not made any changes to his current medications. The patient will return next October for routine follow-up up proximally one year status post coronary artery bypass grafting.    Valentina Gu. Roxy Manns, MD 03/18/2014 2:32 PM

## 2014-03-21 ENCOUNTER — Ambulatory Visit (HOSPITAL_COMMUNITY): Payer: BC Managed Care – PPO

## 2014-03-25 ENCOUNTER — Encounter (HOSPITAL_COMMUNITY): Payer: BC Managed Care – PPO

## 2014-03-27 ENCOUNTER — Encounter (HOSPITAL_COMMUNITY): Payer: BC Managed Care – PPO

## 2014-03-28 ENCOUNTER — Encounter (HOSPITAL_COMMUNITY)
Admission: RE | Admit: 2014-03-28 | Discharge: 2014-03-28 | Disposition: A | Discharge status: Home / Self Care | Payer: BC Managed Care – PPO | Source: Ambulatory Visit | Attending: Cardiology | Admitting: Cardiology

## 2014-03-28 DIAGNOSIS — Z8546 Personal history of malignant neoplasm of prostate: Secondary | ICD-10-CM | POA: Insufficient documentation

## 2014-03-28 DIAGNOSIS — Z951 Presence of aortocoronary bypass graft: Secondary | ICD-10-CM | POA: Insufficient documentation

## 2014-03-28 DIAGNOSIS — J45909 Unspecified asthma, uncomplicated: Secondary | ICD-10-CM | POA: Insufficient documentation

## 2014-03-28 DIAGNOSIS — Z7982 Long term (current) use of aspirin: Secondary | ICD-10-CM | POA: Insufficient documentation

## 2014-03-28 DIAGNOSIS — E785 Hyperlipidemia, unspecified: Secondary | ICD-10-CM | POA: Insufficient documentation

## 2014-03-28 DIAGNOSIS — I350 Nonrheumatic aortic (valve) stenosis: Secondary | ICD-10-CM | POA: Insufficient documentation

## 2014-03-28 DIAGNOSIS — I2510 Atherosclerotic heart disease of native coronary artery without angina pectoris: Secondary | ICD-10-CM | POA: Insufficient documentation

## 2014-03-28 DIAGNOSIS — Z5189 Encounter for other specified aftercare: Secondary | ICD-10-CM | POA: Insufficient documentation

## 2014-03-28 DIAGNOSIS — E119 Type 2 diabetes mellitus without complications: Secondary | ICD-10-CM | POA: Insufficient documentation

## 2014-03-28 NOTE — Progress Notes (Signed)
Cardiac Rehab Medication Review by a Pharmacist  Does the patient  feel that his/her medications are working for him/her?  yes  Has the patient been experiencing any side effects to the medications prescribed?  no  Does the patient measure his/her own blood pressure or blood glucose at home?  no   Does the patient have any problems obtaining medications due to transportation or finances?   no  Understanding of regimen: good Understanding of indications: good Potential of compliance: excellent    Pharmacist comments: Pt has good understanding of medications. Reports no issues.   Elicia Lamp, PharmD Clinical Pharmacist - Resident Pager [phone] 03/28/2014 8:32 AM

## 2014-03-29 ENCOUNTER — Encounter (HOSPITAL_COMMUNITY): Payer: BC Managed Care – PPO

## 2014-04-01 ENCOUNTER — Encounter (HOSPITAL_COMMUNITY): Payer: Self-pay

## 2014-04-01 ENCOUNTER — Encounter (HOSPITAL_COMMUNITY)
Admission: RE | Admit: 2014-04-01 | Discharge: 2014-04-01 | Disposition: A | Discharge status: Home / Self Care | Payer: BC Managed Care – PPO | Source: Ambulatory Visit | Attending: Cardiology | Admitting: Cardiology

## 2014-04-01 DIAGNOSIS — E785 Hyperlipidemia, unspecified: Secondary | ICD-10-CM | POA: Diagnosis not present

## 2014-04-01 DIAGNOSIS — I2510 Atherosclerotic heart disease of native coronary artery without angina pectoris: Secondary | ICD-10-CM | POA: Diagnosis not present

## 2014-04-01 DIAGNOSIS — I350 Nonrheumatic aortic (valve) stenosis: Secondary | ICD-10-CM | POA: Diagnosis not present

## 2014-04-01 DIAGNOSIS — Z5189 Encounter for other specified aftercare: Secondary | ICD-10-CM | POA: Diagnosis present

## 2014-04-01 DIAGNOSIS — Z951 Presence of aortocoronary bypass graft: Secondary | ICD-10-CM | POA: Diagnosis not present

## 2014-04-01 DIAGNOSIS — E119 Type 2 diabetes mellitus without complications: Secondary | ICD-10-CM | POA: Diagnosis not present

## 2014-04-01 DIAGNOSIS — Z8546 Personal history of malignant neoplasm of prostate: Secondary | ICD-10-CM | POA: Diagnosis not present

## 2014-04-01 DIAGNOSIS — Z7982 Long term (current) use of aspirin: Secondary | ICD-10-CM | POA: Diagnosis not present

## 2014-04-01 DIAGNOSIS — J45909 Unspecified asthma, uncomplicated: Secondary | ICD-10-CM | POA: Diagnosis not present

## 2014-04-01 NOTE — Progress Notes (Signed)
Pt started cardiac rehab today.  Pt tolerated light exercise without difficulty.  VSS, telemetry-Sinus rhythm.  Asymptomatic.  PHQ-0.  Pt exhibits positive coping skills, hopeful outlook and supportive family.  Pt enjoys nature/landscape photography as a hobby and is looking forward to returning to this.  Pt rehab goals are to increase stamina, resume hobbies and improve his health.  Pt oriented to exercise equipment and routine.  Understanding verbalized.

## 2014-04-03 ENCOUNTER — Encounter (HOSPITAL_COMMUNITY)
Admission: RE | Admit: 2014-04-03 | Discharge: 2014-04-03 | Disposition: A | Discharge status: Home / Self Care | Payer: BC Managed Care – PPO | Source: Ambulatory Visit | Attending: Cardiology | Admitting: Cardiology

## 2014-04-03 DIAGNOSIS — Z5189 Encounter for other specified aftercare: Secondary | ICD-10-CM | POA: Diagnosis not present

## 2014-04-05 ENCOUNTER — Encounter (HOSPITAL_COMMUNITY)
Admission: RE | Admit: 2014-04-05 | Discharge: 2014-04-05 | Disposition: A | Discharge status: Home / Self Care | Payer: BC Managed Care – PPO | Source: Ambulatory Visit | Attending: Cardiology | Admitting: Cardiology

## 2014-04-05 DIAGNOSIS — Z5189 Encounter for other specified aftercare: Secondary | ICD-10-CM | POA: Diagnosis not present

## 2014-04-08 ENCOUNTER — Telehealth: Payer: Self-pay | Admitting: *Deleted

## 2014-04-08 ENCOUNTER — Other Ambulatory Visit: Payer: Self-pay | Admitting: *Deleted

## 2014-04-08 ENCOUNTER — Encounter (HOSPITAL_COMMUNITY)
Admission: RE | Admit: 2014-04-08 | Discharge: 2014-04-08 | Disposition: A | Discharge status: Home / Self Care | Payer: BC Managed Care – PPO | Source: Ambulatory Visit | Attending: Cardiology | Admitting: Cardiology

## 2014-04-08 DIAGNOSIS — Z5189 Encounter for other specified aftercare: Secondary | ICD-10-CM | POA: Diagnosis not present

## 2014-04-08 MED ORDER — METOPROLOL TARTRATE 25 MG PO TABS: 37.5000 mg | ORAL_TABLET | Freq: Two times a day (BID) | ORAL | Status: DC

## 2014-04-08 NOTE — Telephone Encounter (Signed)
Patient stated that his metoprolol was prescribed by Dr Roxy Manns and wanted me to ask you if he needed to continue taking it. Please advise. Thanks, MI

## 2014-04-08 NOTE — Telephone Encounter (Signed)
We are fine to refill

## 2014-04-08 NOTE — Telephone Encounter (Signed)
Informed patient's wife that refills have been sent to [facility].

## 2014-04-08 NOTE — Telephone Encounter (Signed)
Will you refill or should this come from Dr Roxy Manns?

## 2014-04-08 NOTE — Addendum Note (Signed)
Addended by: Harland German A on: 04/08/2014 06:17 PM   Modules accepted: Orders

## 2014-04-08 NOTE — Progress Notes (Signed)
Melvin Dunn 63 y.o. male Nutrition Note Spoke with pt.  Nutrition Plan and Nutrition Survey goals reviewed with pt. Pt is working toward following the Therapeutic Lifestyle Changes diet. Pt is pre-diabetic. Pre-diabetes discussed. Pt reports he has cut back on "some" carbs and ice cream. Pt expressed understanding of the information reviewed. Pt aware of nutrition education classes offered.  Nutrition Diagnosis ? Food-and nutrition-related knowledge deficit related to lack of exposure to information as related to diagnosis of: ? CVD ? Pre-DM (A1c 6.1) ?  Nutrition RX/ Estimated Daily Nutrition Needs for: wt maintenance 2000-2300 Kcal, 65-75 gm fat, 13-15 gm sat fat, 1.9-2.3 gm trans-fat, <1500 mg sodium  Nutrition Intervention ? Pt's individual nutrition plan reviewed with pt. ? Benefits of adopting Therapeutic Lifestyle Changes discussed when Medficts reviewed. ? Pt to attend the Portion Distortion class ? Pt given handouts for: ? Nutrition I class ? Nutrition II class ? Continue client-centered nutrition education by RD, as part of interdisciplinary care. Goal(s) ? Pt to identify and limit food sources of saturated fat, trans fat, and cholesterol ? Pt able to name foods that affect blood glucose  Monitor and Evaluate progress toward nutrition goal with team. Nutrition Risk: Change to Moderate Melvin Dunn, M.Ed, RD, LDN, CDE 04/08/2014 2:16 PM

## 2014-04-08 NOTE — Telephone Encounter (Signed)
Yes he needs to continue metoprolol

## 2014-04-10 ENCOUNTER — Encounter (HOSPITAL_COMMUNITY)
Admission: RE | Admit: 2014-04-10 | Discharge: 2014-04-10 | Disposition: A | Discharge status: Home / Self Care | Payer: BC Managed Care – PPO | Source: Ambulatory Visit | Attending: Cardiology | Admitting: Cardiology

## 2014-04-10 DIAGNOSIS — Z5189 Encounter for other specified aftercare: Secondary | ICD-10-CM | POA: Diagnosis not present

## 2014-04-10 NOTE — Progress Notes (Signed)
Reviewed home exercise guidelines with patient including endpoints, temperature precautions, target heart rate and rate of perceived exertion. Pt plans to walk and has a treadmill and gazelle machine at home, which he plans to use as his mode of home exercise. Pt voices understanding of instructions given. Sol Passer, MS, ACSM CCEP

## 2014-04-12 ENCOUNTER — Encounter (HOSPITAL_COMMUNITY): Payer: BC Managed Care – PPO

## 2014-04-15 ENCOUNTER — Encounter (HOSPITAL_COMMUNITY): Payer: Self-pay

## 2014-04-15 ENCOUNTER — Encounter (HOSPITAL_COMMUNITY)
Admission: RE | Admit: 2014-04-15 | Discharge: 2014-04-15 | Disposition: A | Discharge status: Home / Self Care | Payer: BC Managed Care – PPO | Source: Ambulatory Visit | Attending: Cardiology | Admitting: Cardiology

## 2014-04-15 DIAGNOSIS — Z5189 Encounter for other specified aftercare: Secondary | ICD-10-CM | POA: Diagnosis not present

## 2014-04-15 NOTE — Progress Notes (Signed)
Pt discharged  from cardiac rehab program today due to change in insurance coverage.  Pt completed 6 exercise sessions.  Medication list reconciled. Repeat  PHQ9 score- 0 .  Pt feels he has made significant lifestyle changes including incorporating exercise into his routine.  Pt plans to exercise on his own at home walking or using treadmill.   Pt will need Md encouragement to continue exercising.

## 2014-04-17 ENCOUNTER — Encounter (HOSPITAL_COMMUNITY): Payer: BC Managed Care – PPO

## 2014-04-19 ENCOUNTER — Encounter (HOSPITAL_COMMUNITY): Payer: BC Managed Care – PPO

## 2014-04-22 ENCOUNTER — Encounter (HOSPITAL_COMMUNITY): Payer: BC Managed Care – PPO

## 2014-04-23 ENCOUNTER — Encounter: Payer: Self-pay | Admitting: Cardiology

## 2014-04-23 ENCOUNTER — Ambulatory Visit (INDEPENDENT_AMBULATORY_CARE_PROVIDER_SITE_OTHER): Payer: Self-pay | Admitting: Cardiology

## 2014-04-23 VITALS — BP 144/76 | HR 61 | Ht 68.0 in | Wt 149.0 lb

## 2014-04-23 DIAGNOSIS — I350 Nonrheumatic aortic (valve) stenosis: Secondary | ICD-10-CM

## 2014-04-23 DIAGNOSIS — E785 Hyperlipidemia, unspecified: Secondary | ICD-10-CM

## 2014-04-23 DIAGNOSIS — I2583 Coronary atherosclerosis due to lipid rich plaque: Principal | ICD-10-CM

## 2014-04-23 DIAGNOSIS — Z951 Presence of aortocoronary bypass graft: Secondary | ICD-10-CM

## 2014-04-23 DIAGNOSIS — I2510 Atherosclerotic heart disease of native coronary artery without angina pectoris: Secondary | ICD-10-CM

## 2014-04-23 DIAGNOSIS — R011 Cardiac murmur, unspecified: Secondary | ICD-10-CM

## 2014-04-23 NOTE — Patient Instructions (Signed)
Your physician recommends that you continue on your current medications as directed. Please refer to the Current Medication list given to you today.  Your physician wants you to follow-up in: 6 months with Dr [name] You will receive a reminder letter in the mail two months in advance. If you don't receive a letter, please call our office to schedule the follow-up appointment.  

## 2014-04-23 NOTE — Progress Notes (Signed)
[name], [name], [name]  [address] Phone: [phone] Fax:  [phone]  Date:  04/23/2014   ID:  Melvin Dunn, DOB [birth year], MRN [id number]  PCP:  Shirline Frees, MD  Cardiologist:  Fransico Him, MD    History of Present Illness:  Melvin Dunn is a 63 y.o. male with a history of dyslipidemia, prediabetes and asthma who presents today for followup of SOB and chest pain. He recently saw Dr. [name] and complained pressure in his chest when he would exert himself. He has also noticed SOB with exertion that is different from his asthma. He denies any nausea but occasionally will have some diaphoresis with the pain. EKG showed NSR at baseline. He underwent nuclear stress test which showed inferoapical ischemia and he underwent cath revealing critical left main stenosis and multivessel ASCAD with normal LVF.  He underwent CABG with LIMA to LAD, SVG to OM, SVG to distal RCA/PL.  He now presents for followup.  He is doing well.  He denies any palpitiations, dizziness, LE edema or syncope.  He has some residual chest soreness.    Wt Readings from Last 3 Encounters:  04/23/14 149 lb (67.586 kg)  03/28/14 148 lb 5.9 oz (67.3 kg)  03/18/14 149 lb (67.586 kg)     Past Medical History  Diagnosis Date  . Environmental allergies   . Asthma   . Hyperlipidemia   . Colon polyp   . Adenocarcinoma     of the prostate, 5% of single core 01/2011-watching and waiting for now  . Kidney stone   . Prediabetes   . Hyperlipemia   . Type II or unspecified type diabetes mellitus without mention of complication, not stated as uncontrolled, borderline   . Aortic stenosis 02/11/2014  . Left main coronary artery disease 02/12/2014  . Coronary artery disease involving native coronary artery with angina pectoris 02/12/2014  . S/P CABG x 4 02/13/2014    LIMA to LAD, SVG to OM1, SVG to RCA-RPL, EVH via right thigh and leg    Current Outpatient Prescriptions  Medication Sig Dispense Refill  .  albuterol (PROAIR HFA) 108 (90 BASE) MCG/ACT inhaler Inhale 1-2 puffs into the lungs daily as needed for wheezing or shortness of breath.    Marland Kitchen aspirin EC 325 MG tablet Take 1 tablet (325 mg total) by mouth daily. 1 tablet 0  . atorvastatin (LIPITOR) 40 MG tablet Take 40 mg by mouth at bedtime.     . fluticasone (FLONASE) 50 MCG/ACT nasal spray Place 2 sprays into both nostrils at bedtime.     . metoprolol tartrate (LOPRESSOR) 25 MG tablet Take 1.5 tablets (37.5 mg total) by mouth 2 (two) times daily. 270 tablet 2  . mometasone-formoterol (DULERA) 200-5 MCG/ACT AERO Inhale 2 puffs into the lungs at bedtime.    . Multiple Vitamin (MULTIVITAMIN WITH MINERALS) TABS tablet Take 1 tablet by mouth daily.    Marland Kitchen NITROSTAT 0.4 MG SL tablet Place 0.4 mg under the tongue every 5 (five) minutes as needed.      No current facility-administered medications for this visit.    Allergies:   No Known Allergies  Social History:  The patient  reports that he has never smoked. He does not have any smokeless tobacco history on file. He reports that he does not drink alcohol or use illicit drugs.   Family History:  The patient's family history includes Alzheimer's disease in his mother; Cancer in his father; Cancer - Lung in  his father; Heart attack in his maternal grandfather; Stroke in his mother.   ROS:  Please see the history of present illness.      All other systems reviewed and negative.   PHYSICAL EXAM: VS:  BP 144/76 mmHg  Pulse 61  Ht 5\' 8"  (1.727 m)  Wt 149 lb (67.586 kg)  BMI 22.66 kg/m2  SpO2 98% Well nourished, well developed, in no acute distress HEENT: normal Neck: no JVD Cardiac:  normal S1, S2; RRR;1/6 SM at RUSB Lungs:  clear to auscultation bilaterally, no wheezing, rhonchi or rales Abd: soft, nontender, no hepatomegaly Ext: no edema Skin: warm and dry Neuro:  CNs 2-12 intact, no focal abnormalities noted  ASSESSMENT AND PLAN:  Coronary artery disease s/p CABG - He is progressing  well after recent multivessel CABG. Continue ASA, statin, beta blocker.  Hyperlipemia - Managed by primary care. If his LDL is not < 70, would strongly consider increasing to high intensity statin (ie Lipitor 80 or Crestor 40). I will get a copy of his last lipids right before his surgery from his PCP.  Aortic stenosis - Mild by recent echo.  FU echo in 1 year.  Followup with me in 6 months     Signed, Fransico Him, MD [facility] 04/23/2014 8:54 AM

## 2014-04-24 ENCOUNTER — Encounter (HOSPITAL_COMMUNITY): Payer: BC Managed Care – PPO

## 2014-04-25 ENCOUNTER — Encounter (HOSPITAL_COMMUNITY): Payer: Self-pay | Admitting: Cardiovascular Disease

## 2014-04-26 ENCOUNTER — Encounter (HOSPITAL_COMMUNITY): Payer: BC Managed Care – PPO

## 2014-04-29 ENCOUNTER — Encounter (HOSPITAL_COMMUNITY): Payer: BC Managed Care – PPO

## 2014-05-01 ENCOUNTER — Encounter (HOSPITAL_COMMUNITY): Payer: BC Managed Care – PPO

## 2014-05-03 ENCOUNTER — Encounter (HOSPITAL_COMMUNITY): Payer: BC Managed Care – PPO

## 2014-05-06 ENCOUNTER — Encounter (HOSPITAL_COMMUNITY): Payer: BC Managed Care – PPO

## 2014-05-08 ENCOUNTER — Encounter (HOSPITAL_COMMUNITY): Payer: BC Managed Care – PPO

## 2014-05-10 ENCOUNTER — Encounter (HOSPITAL_COMMUNITY): Payer: BC Managed Care – PPO

## 2014-05-13 ENCOUNTER — Encounter (HOSPITAL_COMMUNITY): Payer: BC Managed Care – PPO

## 2014-05-15 ENCOUNTER — Encounter (HOSPITAL_COMMUNITY): Payer: BC Managed Care – PPO

## 2014-05-17 ENCOUNTER — Encounter (HOSPITAL_COMMUNITY): Payer: BC Managed Care – PPO

## 2014-05-20 ENCOUNTER — Encounter (HOSPITAL_COMMUNITY): Payer: BC Managed Care – PPO

## 2014-05-22 ENCOUNTER — Encounter (HOSPITAL_COMMUNITY): Payer: BC Managed Care – PPO

## 2014-05-24 ENCOUNTER — Encounter (HOSPITAL_COMMUNITY): Payer: BC Managed Care – PPO

## 2014-05-27 ENCOUNTER — Encounter (HOSPITAL_COMMUNITY): Payer: BC Managed Care – PPO

## 2014-05-29 ENCOUNTER — Encounter (HOSPITAL_COMMUNITY): Payer: BC Managed Care – PPO

## 2014-05-31 ENCOUNTER — Encounter (HOSPITAL_COMMUNITY): Payer: BC Managed Care – PPO

## 2014-06-03 ENCOUNTER — Encounter (HOSPITAL_COMMUNITY): Payer: BC Managed Care – PPO

## 2014-06-05 ENCOUNTER — Encounter (HOSPITAL_COMMUNITY): Payer: BC Managed Care – PPO

## 2014-06-07 ENCOUNTER — Encounter (HOSPITAL_COMMUNITY): Payer: BC Managed Care – PPO

## 2014-06-10 ENCOUNTER — Encounter (HOSPITAL_COMMUNITY): Payer: BC Managed Care – PPO

## 2014-06-12 ENCOUNTER — Encounter (HOSPITAL_COMMUNITY): Payer: BC Managed Care – PPO

## 2014-06-14 ENCOUNTER — Encounter (HOSPITAL_COMMUNITY): Payer: BC Managed Care – PPO

## 2014-06-17 ENCOUNTER — Encounter (HOSPITAL_COMMUNITY): Payer: BC Managed Care – PPO

## 2014-06-19 ENCOUNTER — Encounter (HOSPITAL_COMMUNITY): Payer: BC Managed Care – PPO

## 2014-06-21 ENCOUNTER — Encounter (HOSPITAL_COMMUNITY): Payer: BC Managed Care – PPO

## 2014-06-24 ENCOUNTER — Encounter (HOSPITAL_COMMUNITY): Payer: BC Managed Care – PPO

## 2014-06-26 ENCOUNTER — Encounter (HOSPITAL_COMMUNITY): Payer: BC Managed Care – PPO

## 2014-06-28 ENCOUNTER — Encounter (HOSPITAL_COMMUNITY): Payer: BC Managed Care – PPO

## 2014-07-25 ENCOUNTER — Encounter: Payer: Self-pay | Admitting: Cardiology

## 2014-10-22 ENCOUNTER — Ambulatory Visit: Payer: Self-pay | Admitting: Cardiology

## 2014-12-05 ENCOUNTER — Ambulatory Visit: Payer: Self-pay | Admitting: Cardiology

## 2014-12-20 ENCOUNTER — Encounter: Payer: Self-pay | Admitting: Podiatry

## 2014-12-20 ENCOUNTER — Ambulatory Visit (INDEPENDENT_AMBULATORY_CARE_PROVIDER_SITE_OTHER): Payer: BLUE CROSS/BLUE SHIELD | Admitting: Podiatry

## 2014-12-20 VITALS — BP 137/74 | HR 57 | Resp 16 | Ht 67.0 in | Wt 150.0 lb

## 2014-12-20 DIAGNOSIS — M216X9 Other acquired deformities of unspecified foot: Secondary | ICD-10-CM

## 2014-12-20 DIAGNOSIS — Q828 Other specified congenital malformations of skin: Secondary | ICD-10-CM | POA: Diagnosis not present

## 2014-12-20 DIAGNOSIS — Q667 Congenital pes cavus: Secondary | ICD-10-CM | POA: Diagnosis not present

## 2014-12-20 NOTE — Progress Notes (Signed)
   Subjective:    Patient ID: Melvin Dunn, male    DOB: [birth year], 64 y.o.   MRN: [id number]  HPIThis patient presents to the office with painful callus under his left forefoot.  He says the callus has been present for over 2 years.  He gives a history of ankle injury and he has less motion left foot/ankle.  He says the pain is thickening and painful when walking. He is borderline diabetic.   Review of Systems  All other systems reviewed and are negative.      Objective:   Physical Exam GENERAL APPEARANCE: Alert, conversant. Appropriately groomed. No acute distress.  VASCULAR: Pedal pulses palpable at 2/4 DP and PT bilateral.  Capillary refill time is immediate to all digits,  Proximal to distal cooling it warm to warm.  Digital hair growth is present bilateral  NEUROLOGIC: sensation is intact epicritically and protectively to 5.07 monofilament at 5/5 sites bilateral.  Light touch is intact bilateral, vibratory sensation intact bilateral, achilles tendon reflex is intact bilateral.  MUSCULOSKELETAL: acceptable muscle strength, tone and stability bilateral.  Intrinsic muscluature intact bilateral.  Rectus appearance of foot and digits noted bilateral. Plantarflexed fourth metatarsal left foot.  DERMATOLOGIC: skin color, texture, and turgor are within normal limits.  No preulcerative lesions or ulcers  are seen, no interdigital maceration noted.  No open lesions present.  Digital nails are asymptomatic. No drainage noted. Porokeratosis sub 4th left foot.         Assessment & Plan:  Plantarflexed fourth metatarsal left foot.  Porokeratosis sub 4 th left foot. IE    Debridement of porokeratosis   RTC prn

## 2015-01-22 ENCOUNTER — Other Ambulatory Visit: Payer: Self-pay | Admitting: Cardiology

## 2015-01-24 ENCOUNTER — Encounter: Payer: Self-pay | Admitting: Cardiology

## 2015-01-24 ENCOUNTER — Ambulatory Visit (INDEPENDENT_AMBULATORY_CARE_PROVIDER_SITE_OTHER): Payer: BLUE CROSS/BLUE SHIELD | Admitting: Cardiology

## 2015-01-24 VITALS — BP 124/68 | HR 70 | Ht 67.0 in | Wt 148.6 lb

## 2015-01-24 DIAGNOSIS — I350 Nonrheumatic aortic (valve) stenosis: Secondary | ICD-10-CM | POA: Diagnosis not present

## 2015-01-24 DIAGNOSIS — I2510 Atherosclerotic heart disease of native coronary artery without angina pectoris: Secondary | ICD-10-CM | POA: Diagnosis not present

## 2015-01-24 DIAGNOSIS — Z951 Presence of aortocoronary bypass graft: Secondary | ICD-10-CM | POA: Diagnosis not present

## 2015-01-24 DIAGNOSIS — I2583 Coronary atherosclerosis due to lipid rich plaque: Principal | ICD-10-CM

## 2015-01-24 DIAGNOSIS — E785 Hyperlipidemia, unspecified: Secondary | ICD-10-CM

## 2015-01-24 MED ORDER — NITROGLYCERIN 0.4 MG SL SUBL: 0.4000 mg | SUBLINGUAL_TABLET | SUBLINGUAL | Status: AC | PRN

## 2015-01-24 MED ORDER — METOPROLOL TARTRATE 25 MG PO TABS: 37.5000 mg | ORAL_TABLET | Freq: Two times a day (BID) | ORAL | Status: DC

## 2015-01-24 NOTE — Patient Instructions (Signed)
Medication Instructions: - no changes  Labwork: - Your physician recommends that you return for FASTING lab work in: 1 week- lipid/liver  Procedures/Testing: - none  Follow-Up: - Your physician wants you to follow-up in: 1 year with Dr. Radford Pax. You will receive a reminder letter in the mail two months in advance. If you don't receive a letter, please call our office to schedule the follow-up appointment.  Any Additional Special Instructions Will Be Listed Below (If Applicable).

## 2015-01-24 NOTE — Progress Notes (Signed)
Cardiology Office Note   Date:  01/24/2015   ID:  Melvin Dunn, DOB 10-29-1950, MRN [id number]  PCP:  Shirline Frees, MD    Chief Complaint  Patient presents with  . CAD      History of Present Illness: Melvin Dunn is a 64 y.o. male with a history of dyslipidemia, prediabetes and ASCAD with cath revealing critical left main stenosis and multivessel ASCAD with normal LVF. He underwent CABG with LIMA to LAD, SVG to OM, SVG to distal RCA/PL. He presents for followup. He is doing well. He denies any palpitiations, dizziness, LE edema or syncope. He walks for exercise 2-3 miles daily.      Past Medical History  Diagnosis Date  . Environmental allergies   . Asthma   . Hyperlipidemia   . Colon polyp   . Adenocarcinoma     of the prostate, 5% of single core 01/2011-watching and waiting for now  . Kidney stone   . Prediabetes   . Hyperlipemia   . Type II or unspecified type diabetes mellitus without mention of complication, not stated as uncontrolled, borderline   . Aortic stenosis 02/11/2014  . Left main coronary artery disease 02/12/2014  . Coronary artery disease involving native coronary artery with angina pectoris 02/12/2014  . S/P CABG x 4 02/13/2014    LIMA to LAD, SVG to OM1, SVG to RCA-RPL, EVH via right thigh and leg    Past Surgical History  Procedure Laterality Date  . Coronary artery bypass graft N/A 02/13/2014    Procedure: CORONARY ARTERY BYPASS GRAFTING (CABG) TIMES FOUR USING LEFT INTERNAL MAMMARY ARTERY AND RIGHT SAPHENOUS LEG VEIN HARVESTED ENDOSCOPICALLY;  Surgeon: Rexene Alberts, MD;  Location: [facility];  Service: Open Heart Surgery;  Laterality: N/A;  . Intraoperative transesophageal echocardiogram N/A 02/13/2014    Procedure: INTRAOPERATIVE TRANSESOPHAGEAL ECHOCARDIOGRAM;  Surgeon: Rexene Alberts, MD;  Location: [place] [facility];  Service: Open Heart Surgery;  Laterality: N/A;  . Left heart catheterization with coronary angiogram N/A 02/12/2014   Procedure: LEFT HEART CATHETERIZATION WITH CORONARY ANGIOGRAM;  Surgeon: Blane Ohara, MD;  Location: [facility] CATH LAB;  Service: Cardiovascular;  Laterality: N/A;     Current Outpatient Prescriptions  Medication Sig Dispense Refill  . albuterol (PROAIR HFA) 108 (90 BASE) MCG/ACT inhaler Inhale 1-2 puffs into the lungs daily as needed for wheezing or shortness of breath.    Marland Kitchen aspirin EC 325 MG tablet Take 1 tablet (325 mg total) by mouth daily. 1 tablet 0  . atorvastatin (LIPITOR) 40 MG tablet Take 40 mg by mouth at bedtime.    . Cinnamon 500 MG capsule Take 1,000 mg by mouth daily.    . fluticasone (FLONASE) 50 MCG/ACT nasal spray Place 2 sprays into both nostrils at bedtime.    Astrid Drafts Omega-3 300 MG CAPS Take 1 capsule by mouth daily.    . metoprolol tartrate (LOPRESSOR) 25 MG tablet TAKE ONE & ONE-HALF TABLETS BY MOUTH TWICE DAILY 270 tablet 0  . mometasone-formoterol (DULERA) 200-5 MCG/ACT AERO Inhale 2 puffs into the lungs 2 (two) times daily.    . Multiple Vitamin (MULTIVITAMIN WITH MINERALS) TABS tablet Take 1 tablet by mouth daily.    . nitroGLYCERIN (NITROSTAT) 0.4 MG SL tablet Place 0.4 mg under the tongue every 5 (five) minutes as needed for chest pain.     No current facility-administered medications for this visit.  Allergies:   Review of patient's allergies indicates no known allergies.    Social History:  The patient  reports that he has never smoked. He does not have any smokeless tobacco history on file. He reports that he does not drink alcohol or use illicit drugs.   Family History:  The patient's family history includes Alzheimer's disease in his mother; Cancer in his father; Cancer - Lung in his father; Heart attack in his maternal grandfather; Stroke in his mother.    ROS:  Please see the history of present illness.   Otherwise, review of systems are positive for none.   All other systems are reviewed and negative.    PHYSICAL EXAM: VS:  BP 124/68 mmHg   Pulse 70  Ht 5\' 7"  (1.702 m)  Wt 148 lb 9.6 oz (67.405 kg)  BMI 23.27 kg/m2 , BMI Body mass index is 23.27 kg/(m^2). GEN: Well nourished, well developed, in no acute distress HEENT: normal Neck: no JVD, carotid bruits, or masses Cardiac: RRR; no murmurs, rubs, or gallops,no edema  Respiratory:  clear to auscultation bilaterally, normal work of breathing GI: soft, nontender, nondistended, + BS MS: no deformity or atrophy Skin: warm and dry, no rash Neuro:  Strength and sensation are intact Psych: euthymic mood, full affect   EKG:  EKG is ordered today. The ekg ordered today demonstrates NSR with no ST changes   Recent Labs: 02/12/2014: ALT 31 02/14/2014: Magnesium 2.3 02/18/2014: BUN 16; Creatinine, Ser 0.80; Hemoglobin 9.1*; Platelets 465*; Potassium 5.4*; Sodium 138    Lipid Panel No results found for: CHOL, TRIG, HDL, CHOLHDL, VLDL, LDLCALC, LDLDIRECT    Wt Readings from Last 3 Encounters:  01/24/15 148 lb 9.6 oz (67.405 kg)  12/20/14 150 lb (68.04 kg)  04/23/14 149 lb (67.586 kg)     ASSESSMENT AND PLAN:  Coronary artery disease s/p CABG - He has no angina.  Continue ASA, statin, beta blocker.  Hyperlipemia - Managed by primary care. If his LDL is not < 70, would strongly consider increasing to high intensity statin (ie Lipitor 80 or Crestor 40). I will get a copy of his last lipids from his PCP.  Aortic stenosis - Mild by echo 1 year ago.    Current medicines are reviewed at length with the patient today.  The patient does not have concerns regarding medicines.  The following changes have been made:  no change  Labs/ tests ordered today: See above Assessment and Plan No orders of the defined types were placed in this encounter.     Disposition:   FU with me in 6 months  Signed, Sueanne Margarita, MD  01/24/2015 3:51 PM    [facility] [place], [place], [place]  [address] Phone: [phone]; Fax: [phone]

## 2015-01-27 ENCOUNTER — Other Ambulatory Visit (INDEPENDENT_AMBULATORY_CARE_PROVIDER_SITE_OTHER): Payer: BLUE CROSS/BLUE SHIELD | Admitting: *Deleted

## 2015-01-27 DIAGNOSIS — I2510 Atherosclerotic heart disease of native coronary artery without angina pectoris: Secondary | ICD-10-CM

## 2015-01-27 DIAGNOSIS — I2583 Coronary atherosclerosis due to lipid rich plaque: Principal | ICD-10-CM

## 2015-01-27 LAB — LIPID PANEL
CHOL/HDL RATIO: 4
CHOLESTEROL: 140 mg/dL (ref 0–200)
HDL: 37.2 mg/dL — ABNORMAL LOW (ref >39.00)
LDL CALC: 91 mg/dL (ref 0–99)
NONHDL: 102.79
Triglycerides: 60 mg/dL (ref 0.0–149.0)
VLDL: 12 mg/dL (ref 0.0–40.0)

## 2015-01-27 LAB — HEPATIC FUNCTION PANEL
ALT: 39 U/L (ref 0–53)
AST: 28 U/L (ref 0–37)
Albumin: 4.2 g/dL (ref 3.5–5.2)
Alkaline Phosphatase: 74 U/L (ref 39–117)
BILIRUBIN DIRECT: 0.1 mg/dL (ref 0.0–0.3)
BILIRUBIN TOTAL: 0.6 mg/dL (ref 0.2–1.2)
Total Protein: 7.1 g/dL (ref 6.0–8.3)

## 2015-01-28 ENCOUNTER — Telehealth: Payer: Self-pay

## 2015-01-28 DIAGNOSIS — E785 Hyperlipidemia, unspecified: Secondary | ICD-10-CM

## 2015-01-28 MED ORDER — ATORVASTATIN CALCIUM 80 MG PO TABS: 80.0000 mg | ORAL_TABLET | Freq: Every day | ORAL | Status: DC

## 2015-01-28 NOTE — Telephone Encounter (Signed)
Informed patient of results and verbal understanding expressed.  Instructed patient to INCREASE LIPITOR to 80 mg daily. FLP and ALT scheduled for 11/7 per patient request. Patient agrees with treatment plan.

## 2015-01-28 NOTE — Telephone Encounter (Signed)
-----   Message from Sueanne Margarita, MD sent at 01/27/2015  8:16 PM EDT ----- LDL not at goal - increase Lipitor to 80mg  daily and recheck FLP and ALT In 6 weeks

## 2015-02-17 ENCOUNTER — Ambulatory Visit (INDEPENDENT_AMBULATORY_CARE_PROVIDER_SITE_OTHER): Payer: BLUE CROSS/BLUE SHIELD | Admitting: Thoracic Surgery (Cardiothoracic Vascular Surgery)

## 2015-02-17 ENCOUNTER — Encounter: Payer: Self-pay | Admitting: Thoracic Surgery (Cardiothoracic Vascular Surgery)

## 2015-02-17 VITALS — BP 142/73 | HR 62 | Resp 16 | Ht 68.0 in | Wt 150.0 lb

## 2015-02-17 DIAGNOSIS — I2510 Atherosclerotic heart disease of native coronary artery without angina pectoris: Secondary | ICD-10-CM

## 2015-02-17 DIAGNOSIS — Z951 Presence of aortocoronary bypass graft: Secondary | ICD-10-CM

## 2015-02-17 NOTE — Patient Instructions (Signed)
Continue all previous medications without any changes at this time  

## 2015-02-17 NOTE — Progress Notes (Signed)
[address]       [place] [address]             [phone]     CARDIOTHORACIC SURGERY OFFICE NOTE  Referring Provider is Sueanne Margarita, MD PCP is Shirline Frees, MD   HPI:  Patient returns for routine follow-up approximately one year status post coronary artery bypass grafting 4 for severe three-vessel coronary artery disease and left main disease. His postoperative recovery was uneventful and he was last seen here in our office on 03/18/2014. Since then he has continued to do very well. He has been seen in follow-up on several occasions by Dr. Radford Pax. He walks everyday for exercise and reports no symptoms of exertional shortness of breath or chest discomfort. Prior to surgery his primary symptom was that of exertional shortness of breath. He states that this is noticeably better.   Current Outpatient Prescriptions  Medication Sig Dispense Refill  . albuterol (PROAIR HFA) 108 (90 BASE) MCG/ACT inhaler Inhale 1-2 puffs into the lungs daily as needed for wheezing or shortness of breath.    Marland Kitchen aspirin EC 325 MG tablet Take 1 tablet (325 mg total) by mouth daily. 1 tablet 0  . atorvastatin (LIPITOR) 80 MG tablet Take 1 tablet (80 mg total) by mouth at bedtime. 30 tablet 11  . Cinnamon 500 MG capsule Take 1,000 mg by mouth daily.    . fluticasone (FLONASE) 50 MCG/ACT nasal spray Place 2 sprays into both nostrils at bedtime.    . metoprolol tartrate (LOPRESSOR) 25 MG tablet Take 1.5 tablets (37.5 mg total) by mouth 2 (two) times daily. 270 tablet 3  . mometasone-formoterol (DULERA) 200-5 MCG/ACT AERO Inhale 2 puffs into the lungs 2 (two) times daily.    . Multiple Vitamin (MULTIVITAMIN WITH MINERALS) TABS tablet Take 1 tablet by mouth daily.    . nitroGLYCERIN (NITROSTAT) 0.4 MG SL tablet Place 1 tablet (0.4 mg total) under the tongue every 5 (five) minutes as needed for chest pain. 25 tablet 3  . Omega-3 Krill Oil 500 MG CAPS Take 1 capsule by mouth daily.      No current facility-administered medications for this visit.      Physical Exam:   BP 142/73 mmHg  Pulse 62  Resp 16  Ht 5\' 8"  (1.727 m)  Wt 150 lb (68.04 kg)  BMI 22.81 kg/m2  SpO2 97%  General:  Well-appearing  Chest:   clear  CV:   Regular rate and rhythm with grade 3/6 systolic murmur heard best at left lower sternal border  Incisions:  Completely healed, sternum is stable  Abdomen:  Soft and nontender  Extremities:  Warm and well-perfused  Diagnostic Tests:  n/a   Impression:  Patient is doing very well approximately one year status post coronary artery bypass grafting 4. He exercises every day and reports essentially no symptoms whatsoever. He specifically denies any symptoms of exertional shortness of breath or chest discomfort. He does have very mild aortic stenosis that will need to be followed intermittently.  Plan:  The patient will continue to follow-up with Dr. Radford Pax. In the future he will call and return to see [place] as needed. All of his questions have been addressed.   I spent in excess of 15 minutes during the conduct of this office consultation and >50% of this time involved direct face-to-face encounter with the patient for counseling and/or coordination of their care.   Valentina Gu. Roxy Manns, MD 02/17/2015 1:00 PM

## 2015-03-24 ENCOUNTER — Other Ambulatory Visit (INDEPENDENT_AMBULATORY_CARE_PROVIDER_SITE_OTHER): Payer: BLUE CROSS/BLUE SHIELD | Admitting: *Deleted

## 2015-03-24 DIAGNOSIS — E785 Hyperlipidemia, unspecified: Secondary | ICD-10-CM

## 2015-03-24 LAB — ALT: ALT: 44 U/L (ref 9–46)

## 2015-03-24 LAB — LIPID PANEL
CHOL/HDL RATIO: 3.4 ratio (ref <=5.0)
CHOLESTEROL: 127 mg/dL (ref 125–200)
HDL: 37 mg/dL — ABNORMAL LOW (ref >=40)
LDL Cholesterol: 76 mg/dL (ref <130)
Triglycerides: 68 mg/dL (ref <150)
VLDL: 14 mg/dL (ref <30)

## 2015-05-07 ENCOUNTER — Telehealth: Payer: Self-pay

## 2015-05-07 DIAGNOSIS — E785 Hyperlipidemia, unspecified: Secondary | ICD-10-CM

## 2015-05-07 MED ORDER — EZETIMIBE 10 MG PO TABS: 10.0000 mg | ORAL_TABLET | Freq: Every day | ORAL | Status: DC

## 2015-05-07 NOTE — Telephone Encounter (Signed)
Zetia 10 mg daily called in to pharmacy of choice. FLP and ALT scheduled for Monday, Jul 07, 2015. Patient agrees with treatment plan.

## 2015-05-07 NOTE — Telephone Encounter (Signed)
-----   Message from Sueanne Margarita, MD sent at 03/26/2015  4:50 PM EST ----- LDL not at goal on high dose lipitor.  Add Zetia 10mg  daily and recheck FLP and ALT in 6 weeks

## 2015-07-07 ENCOUNTER — Other Ambulatory Visit (INDEPENDENT_AMBULATORY_CARE_PROVIDER_SITE_OTHER): Payer: Medicare Other | Admitting: *Deleted

## 2015-07-07 DIAGNOSIS — E785 Hyperlipidemia, unspecified: Secondary | ICD-10-CM | POA: Diagnosis not present

## 2015-07-07 LAB — LIPID PANEL
Cholesterol: 135 mg/dL (ref 125–200)
HDL: 43 mg/dL (ref >=40)
LDL CALC: 75 mg/dL (ref <130)
TRIGLYCERIDES: 85 mg/dL (ref <150)
Total CHOL/HDL Ratio: 3.1 Ratio (ref <=5.0)
VLDL: 17 mg/dL (ref <30)

## 2015-07-07 LAB — ALT: ALT: 55 U/L — AB (ref 9–46)

## 2015-10-27 ENCOUNTER — Telehealth: Payer: Self-pay | Admitting: Cardiology

## 2015-10-27 NOTE — Telephone Encounter (Signed)
Patient calling the office for samples of medication:   1.  What medication and dosage are you requesting samples for? Zetia  (mg) unknown  2.  Are you currently out of this medication? Out for a week

## 2015-10-27 NOTE — Telephone Encounter (Signed)
Spoke with patient to let him know that since zetia now has a generic, we do not have samples. Patient thanked me for the call.

## 2016-01-29 ENCOUNTER — Other Ambulatory Visit: Payer: Self-pay | Admitting: Cardiology

## 2016-02-03 ENCOUNTER — Ambulatory Visit: Payer: Medicare Other | Admitting: Cardiology

## 2016-02-18 ENCOUNTER — Other Ambulatory Visit: Payer: Self-pay | Admitting: Cardiology

## 2016-04-24 ENCOUNTER — Other Ambulatory Visit: Payer: Self-pay | Admitting: Cardiology

## 2016-04-28 ENCOUNTER — Other Ambulatory Visit: Payer: Self-pay | Admitting: Cardiology

## 2016-05-03 ENCOUNTER — Encounter: Payer: Self-pay | Admitting: Cardiology

## 2016-05-03 ENCOUNTER — Ambulatory Visit (INDEPENDENT_AMBULATORY_CARE_PROVIDER_SITE_OTHER): Payer: Medicare Other | Admitting: Cardiology

## 2016-05-03 ENCOUNTER — Encounter (INDEPENDENT_AMBULATORY_CARE_PROVIDER_SITE_OTHER): Payer: Self-pay

## 2016-05-03 VITALS — BP 140/82 | HR 60 | Resp 97 | Wt 163.0 lb

## 2016-05-03 DIAGNOSIS — I350 Nonrheumatic aortic (valve) stenosis: Secondary | ICD-10-CM

## 2016-05-03 DIAGNOSIS — E7800 Pure hypercholesterolemia, unspecified: Secondary | ICD-10-CM | POA: Diagnosis not present

## 2016-05-03 DIAGNOSIS — I2510 Atherosclerotic heart disease of native coronary artery without angina pectoris: Secondary | ICD-10-CM

## 2016-05-03 LAB — HEPATIC FUNCTION PANEL
ALBUMIN: 4 g/dL (ref 3.6–5.1)
ALT: 38 U/L (ref 9–46)
AST: 28 U/L (ref 10–35)
Alkaline Phosphatase: 69 U/L (ref 40–115)
BILIRUBIN DIRECT: 0.2 mg/dL (ref <=0.2)
Indirect Bilirubin: 0.5 mg/dL (ref 0.2–1.2)
TOTAL PROTEIN: 6.5 g/dL (ref 6.1–8.1)
Total Bilirubin: 0.7 mg/dL (ref 0.2–1.2)

## 2016-05-03 LAB — LIPID PANEL
Cholesterol: 124 mg/dL (ref <200)
HDL: 34 mg/dL — ABNORMAL LOW (ref >40)
LDL CALC: 72 mg/dL (ref <100)
Total CHOL/HDL Ratio: 3.6 Ratio (ref <5.0)
Triglycerides: 92 mg/dL (ref <150)
VLDL: 18 mg/dL (ref <30)

## 2016-05-03 NOTE — Progress Notes (Signed)
Cardiology Office Note    Date:  05/03/2016   ID:  Melvin Dunn, DOB 04/03/1951, MRN [id number]  PCP:  Shirline Frees, MD  Cardiologist:  Fransico Him, MD   Chief Complaint  Patient presents with  . Follow-up  . Coronary Artery Disease  . Hyperlipidemia  . Aortic Stenosis    History of Present Illness:  Melvin Dunn is a 65 y.o. male with a history of dyslipidemia, prediabetes and ASCAD with cath revealing critical left main stenosis and multivessel ASCAD with normal LVF. He underwent CABG with LIMA to LAD, SVG to OM, SVG to distal RCA/PL. He presents for followup. He is doing well and is getting over a cold. He denies any anginal chest pain or pressure, SOB, DOE, PND, orthopnea, palpitiations, dizziness, LE edema or syncope. He walks for exercise 1-2 miles daily.     Past Medical History:  Diagnosis Date  . Adenocarcinoma ([place])    of the prostate, 5% of single core 01/2011-watching and waiting for now  . Aortic stenosis 02/11/2014  . Asthma   . Colon polyp   . Coronary artery disease involving native coronary artery with angina pectoris ([place]) 02/12/2014  . Environmental allergies   . Hyperlipemia   . Hyperlipidemia   . Kidney stone   . Left main coronary artery disease 02/12/2014  . Prediabetes   . S/P CABG x 4 02/13/2014   LIMA to LAD, SVG to OM1, SVG to RCA-RPL, EVH via right thigh and leg  . Type II or unspecified type diabetes mellitus without mention of complication, not stated as uncontrolled, borderline     Past Surgical History:  Procedure Laterality Date  . CORONARY ARTERY BYPASS GRAFT N/A 02/13/2014   Procedure: CORONARY ARTERY BYPASS GRAFTING (CABG) TIMES FOUR USING LEFT INTERNAL MAMMARY ARTERY AND RIGHT SAPHENOUS LEG VEIN HARVESTED ENDOSCOPICALLY;  Surgeon: Rexene Alberts, MD;  Location: [place];  Service: Open Heart Surgery;  Laterality: N/A;  . INTRAOPERATIVE TRANSESOPHAGEAL ECHOCARDIOGRAM N/A 02/13/2014   Procedure: INTRAOPERATIVE TRANSESOPHAGEAL  ECHOCARDIOGRAM;  Surgeon: Rexene Alberts, MD;  Location: [place] [facility];  Service: Open Heart Surgery;  Laterality: N/A;  . LEFT HEART CATHETERIZATION WITH CORONARY ANGIOGRAM N/A 02/12/2014   Procedure: LEFT HEART CATHETERIZATION WITH CORONARY ANGIOGRAM;  Surgeon: Blane Ohara, MD;  Location: [facility] CATH LAB;  Service: Cardiovascular;  Laterality: N/A;    Current Medications: Outpatient Medications Prior to Visit  Medication Sig Dispense Refill  . albuterol (PROAIR HFA) 108 (90 BASE) MCG/ACT inhaler Inhale 1-2 puffs into the lungs daily as needed for wheezing or shortness of breath.    Marland Kitchen aspirin EC 325 MG tablet Take 1 tablet (325 mg total) by mouth daily. 1 tablet 0  . atorvastatin (LIPITOR) 80 MG tablet TAKE ONE TABLET BY MOUTH AT BEDTIME 30 tablet 11  . Cinnamon 500 MG capsule Take 1,000 mg by mouth daily. Take 2 Capsule Equal 1000 IU    . ezetimibe (ZETIA) 10 MG tablet Take 1 tablet (10 mg total) by mouth daily. 30 tablet 11  . fluticasone (FLONASE) 50 MCG/ACT nasal spray Place 2 sprays into both nostrils at bedtime.    . metoprolol tartrate (LOPRESSOR) 25 MG tablet TAKE ONE & ONE-HALF TABLETS BY MOUTH TWICE DAILY. MUST KEEP 05/03/16 APPOINTMENT FOR ADDITIONAL REFILLS. 270 tablet 0  . mometasone-formoterol (DULERA) 200-5 MCG/ACT AERO Inhale 2 puffs into the lungs 2 (two) times daily.    . Multiple Vitamin (MULTIVITAMIN WITH MINERALS) TABS tablet Take 1 tablet by mouth daily.    Marland Kitchen  nitroGLYCERIN (NITROSTAT) 0.4 MG SL tablet Place 1 tablet (0.4 mg total) under the tongue every 5 (five) minutes as needed for chest pain. 25 tablet 3  . Omega-3 Krill Oil 500 MG CAPS Take 1 capsule by mouth daily.     No facility-administered medications prior to visit.      Allergies:   Patient has no known allergies.   Social History   Social History  . Marital status: Single    Spouse name: N/A  . Number of children: N/A  . Years of education: N/A   Social History Main Topics  . Smoking status: Never Smoker    . Smokeless tobacco: None  . Alcohol use No  . Drug use: No  . Sexual activity: Not Asked   Other Topics Concern  . None   Social History Narrative  . None     Family History:  The patient's family history includes Alzheimer's disease in his mother; Cancer in his father; Cancer - Lung in his father; Heart attack in his maternal grandfather; Stroke in his mother.   ROS:   Please see the history of present illness.    ROS All other systems reviewed and are negative.  No flowsheet data found.     PHYSICAL EXAM:   VS:  BP 140/82   Pulse 60   Resp (!) 97   Wt 163 lb (73.9 kg)   BMI 24.78 kg/m    GEN: Well nourished, well developed, in no acute distress  HEENT: normal  Neck: no JVD, carotid bruits, or masses Cardiac: RRR; no rubs, or gallops,no edema.  Intact distal pulses bilaterally. 2/6 SM at RUSB to LLSB Respiratory:  clear to auscultation bilaterally, normal work of breathing GI: soft, nontender, nondistended, + BS MS: no deformity or atrophy  Skin: warm and dry, no rash Neuro:  Alert and Oriented x 3, Strength and sensation are intact Psych: euthymic mood, full affect  Wt Readings from Last 3 Encounters:  05/03/16 163 lb (73.9 kg)  02/17/15 150 lb (68 kg)  01/24/15 148 lb 9.6 oz (67.4 kg)      Studies/Labs Reviewed:   EKG:  EKG ist ordered today and showed NSR at 64bpm with no ST changes  Recent Labs: 07/07/2015: ALT 55   Lipid Panel    Component Value Date/Time   CHOL 135 07/07/2015 0803   TRIG 85 07/07/2015 0803   HDL 43 07/07/2015 0803   CHOLHDL 3.1 07/07/2015 0803   VLDL 17 07/07/2015 0803   LDLCALC 75 07/07/2015 0803    Additional studies/ records that were reviewed today include:  none    ASSESSMENT:    1. Coronary artery disease involving native coronary artery of native heart without angina pectoris   2. Aortic valve stenosis, etiology of cardiac valve disease unspecified   3. Pure hypercholesterolemia      PLAN:  In order of  problems listed above:  1. Severe ASCAD s/p CABG - he is doing well with no anginal symptoms.  Continue ASA/statin and BB. 2. Aortic stenosis - mild - repeat echo since it has been 2 years. 3. Hyperlipidemia - LDL goal < 70.  Continue statin/zetia.  Check FLP and ALT.     Medication Adjustments/Labs and Tests Ordered: Current medicines are reviewed at length with the patient today.  Concerns regarding medicines are outlined above.  Medication changes, Labs and Tests ordered today are listed in the Patient Instructions below.  There are no Patient Instructions on file for this visit.  Signed, Fransico Him, MD  05/03/2016 8:39 AM    [facility] [address], [place], [place]  [address] Phone: [phone]; Fax: [phone]

## 2016-05-03 NOTE — Patient Instructions (Signed)
Medication Instructions:  Your physician recommends that you continue on your current medications as directed. Please refer to the Current Medication list given to you today.   Labwork: TODAY: LFTS, Lipids  Testing/Procedures: Your physician has requested that you have an echocardiogram. Echocardiography is a painless test that uses sound waves to create images of your heart. It provides your doctor with information about the size and shape of your heart and how well your heart's chambers and valves are working. This procedure takes approximately one hour. There are no restrictions for this procedure.  Follow-Up: Your physician wants you to follow-up in: 1 year with Dr. Radford Pax. You will receive a reminder letter in the mail two months in advance. If you don't receive a letter, please call our office to schedule the follow-up appointment.   Any Other Special Instructions Will Be Listed Below (If Applicable).     If you need a refill on your cardiac medications before your next appointment, please call your pharmacy.

## 2016-05-12 ENCOUNTER — Ambulatory Visit (INDEPENDENT_AMBULATORY_CARE_PROVIDER_SITE_OTHER): Payer: Medicare Other | Admitting: Podiatry

## 2016-05-12 ENCOUNTER — Encounter: Payer: Self-pay | Admitting: Podiatry

## 2016-05-12 VITALS — Ht 68.0 in | Wt 163.0 lb

## 2016-05-12 DIAGNOSIS — Q828 Other specified congenital malformations of skin: Secondary | ICD-10-CM

## 2016-05-12 DIAGNOSIS — E119 Type 2 diabetes mellitus without complications: Secondary | ICD-10-CM

## 2016-05-12 DIAGNOSIS — M216X9 Other acquired deformities of unspecified foot: Secondary | ICD-10-CM

## 2016-05-12 NOTE — Progress Notes (Signed)
   Subjective:    Patient ID: Melvin Dunn, male    DOB: Dec 06, 1950, 65 y.o.   MRN: [id number]  HPIThis patient presents to the office with painful callus under his left forefoot.  He says the callus has been present for over 2 years.  He gives a history of ankle injury and he has less motion left foot/ankle.  He says the pain is worsening  and painful when walking. He is borderline diabetic.   Review of Systems  All other systems reviewed and are negative.      Objective:   Physical Exam GENERAL APPEARANCE: Alert, conversant. Appropriately groomed. No acute distress.  VASCULAR: Pedal pulses palpable at 2/4 DP and PT bilateral.  Capillary refill time is immediate to all digits,  Proximal to distal cooling it warm to warm.  Digital hair growth is present bilateral  NEUROLOGIC: sensation is intact epicritically and protectively to 5.07 monofilament at 5/5 sites bilateral.  Light touch is intact bilateral, vibratory sensation intact bilateral, achilles tendon reflex is intact bilateral.  MUSCULOSKELETAL: acceptable muscle strength, tone and stability bilateral.  Intrinsic muscluature intact bilateral.  Rectus appearance of foot and digits noted bilateral. Plantarflexed fourth metatarsal left foot.  DERMATOLOGIC: skin color, texture, and turgor are within normal limits.  No preulcerative lesions or ulcers  are seen, no interdigital maceration noted.  No open lesions present.  Digital nails are asymptomatic. No drainage noted. Porokeratosis sub 4th left foot.         Assessment & Plan:  Plantarflexed fourth metatarsal left foot.  Porokeratosis sub 4 th left foot. IE    Debridement of porokeratosis   RTC prn   Gardiner Barefoot DPM

## 2016-05-13 NOTE — Addendum Note (Signed)
Addended by: Harland German A on: 05/13/2016 03:05 PM   Modules accepted: Orders

## 2016-05-20 ENCOUNTER — Telehealth: Payer: Self-pay

## 2016-05-20 ENCOUNTER — Other Ambulatory Visit: Payer: Self-pay

## 2016-05-20 ENCOUNTER — Ambulatory Visit (HOSPITAL_COMMUNITY): Payer: Medicare Other | Attending: Cardiovascular Disease

## 2016-05-20 DIAGNOSIS — I2510 Atherosclerotic heart disease of native coronary artery without angina pectoris: Secondary | ICD-10-CM | POA: Diagnosis not present

## 2016-05-20 DIAGNOSIS — I083 Combined rheumatic disorders of mitral, aortic and tricuspid valves: Secondary | ICD-10-CM | POA: Diagnosis not present

## 2016-05-20 DIAGNOSIS — E119 Type 2 diabetes mellitus without complications: Secondary | ICD-10-CM | POA: Diagnosis not present

## 2016-05-20 DIAGNOSIS — I350 Nonrheumatic aortic (valve) stenosis: Secondary | ICD-10-CM | POA: Diagnosis not present

## 2016-05-20 DIAGNOSIS — Z951 Presence of aortocoronary bypass graft: Secondary | ICD-10-CM | POA: Diagnosis not present

## 2016-05-20 DIAGNOSIS — E785 Hyperlipidemia, unspecified: Secondary | ICD-10-CM | POA: Diagnosis not present

## 2016-05-20 NOTE — Telephone Encounter (Signed)
-----   Message from Sueanne Margarita, MD sent at 05/20/2016  4:06 PM EST ----- Echo showed normal LVF with mild to moderate AS and moderate AR, mild ME and TR - repeat echo in 1 year for AS

## 2016-05-20 NOTE — Telephone Encounter (Signed)
Informed patient of results and verbal understanding expressed.  Repeat ECHO ordered to be scheduled in 1 year. Patient agrees with treatment plan. 

## 2016-05-26 ENCOUNTER — Other Ambulatory Visit: Payer: Self-pay | Admitting: Cardiology

## 2016-05-28 NOTE — Telephone Encounter (Signed)
Rx refill sent to pharmacy. 

## 2016-05-31 ENCOUNTER — Other Ambulatory Visit: Payer: Self-pay | Admitting: *Deleted

## 2016-05-31 MED ORDER — EZETIMIBE 10 MG PO TABS: 10.0000 mg | ORAL_TABLET | Freq: Every day | ORAL | 11 refills | Status: DC

## 2016-07-23 ENCOUNTER — Other Ambulatory Visit: Payer: Self-pay | Admitting: Cardiology

## 2016-07-26 ENCOUNTER — Other Ambulatory Visit: Payer: Self-pay | Admitting: *Deleted

## 2016-07-26 MED ORDER — ATORVASTATIN CALCIUM 80 MG PO TABS: 80.0000 mg | ORAL_TABLET | Freq: Every day | ORAL | 2 refills | Status: DC

## 2016-07-26 MED ORDER — EZETIMIBE 10 MG PO TABS: 10.0000 mg | ORAL_TABLET | Freq: Every day | ORAL | 2 refills | Status: DC

## 2016-08-01 ENCOUNTER — Telehealth: Payer: Self-pay | Admitting: Oncology

## 2016-08-01 ENCOUNTER — Encounter: Payer: Self-pay | Admitting: Oncology

## 2016-08-01 NOTE — Telephone Encounter (Signed)
Appt has been scheduled for the pt to see Dr. Alen Blew on 4/25 at 2pm. Pt demographics verified. Agreed to the appt date and time. Aware to arrive 30 min early. Letter mailed to the pt and faxed to the referring.

## 2016-09-08 ENCOUNTER — Telehealth: Payer: Self-pay | Admitting: Oncology

## 2016-09-08 ENCOUNTER — Ambulatory Visit (HOSPITAL_BASED_OUTPATIENT_CLINIC_OR_DEPARTMENT_OTHER): Payer: Medicare Other | Admitting: Oncology

## 2016-09-08 VITALS — BP 147/69 | HR 70 | Temp 97.8°F | Resp 18 | Ht 68.0 in | Wt 173.4 lb

## 2016-09-08 DIAGNOSIS — C61 Malignant neoplasm of prostate: Secondary | ICD-10-CM

## 2016-09-08 DIAGNOSIS — D72829 Elevated white blood cell count, unspecified: Secondary | ICD-10-CM | POA: Diagnosis not present

## 2016-09-08 NOTE — Progress Notes (Signed)
Reason for Referral: Leukocytosis.   HPI: 66 year old gentleman currently of [place] where he lived the majority of his life. He is a gentleman with history of coronary artery disease, hypertension and hyperlipidemia. He was also diagnosed with low-grade prostate cancer and currently on observation surveillance by Dr. Karsten Ro. a CBC obtained in February 2018 showed a white cell count of 15.5. His hemoglobin was 15.4 and his platelet count was 271. He did have a mild neutrophilia at 72.9% lymphocyte percentage was 16.5 which is close to normal range. The differential was otherwise normal. CBCs reviewed in 2015 during his hospitalization for his bypass graft surgery that indicated to reactive leukocytosis. His white cell count on admission was 10,000 and did elevate close to 20,000 periodically during his hospitalization. On 02/18/2014 his white cell count was 19.7. Recently he denied any recent illnesses or infections. He does report potential development of seasonal allergy and asthma and does have an inhaler which she has not used. He denied any smoking history. He denied any constitutional symptoms including no fevers, chills or weight loss. He denied any lymphadenopathy or petechiae. He remains active and works full time. He denied any excessive fatigue or tiredness.  He does not report any headaches, blurry vision, syncope or seizures. He does not report any fevers, chills or sweats. He does not report any chest pain, palpitation, orthopnea or leg edema. He does not report any cough, wheezing or hemoptysis. He does not report any nausea, vomiting or abdominal pain. He does not report any constipation or diarrhea. He does not report any frequency urgency or hesitancy. He does not report any skeletal complaints. Remaining review of systems unremarkable.   Past Medical History:  Diagnosis Date  . Adenocarcinoma ([place])    of the prostate, 5% of single core 01/2011-watching and waiting for now  . Aortic  stenosis 02/11/2014  . Asthma   . Colon polyp   . Coronary artery disease involving native coronary artery with angina pectoris ([place]) 02/12/2014  . Environmental allergies   . Hyperlipemia   . Hyperlipidemia   . Kidney stone   . Left main coronary artery disease 02/12/2014  . Prediabetes   . S/P CABG x 4 02/13/2014   LIMA to LAD, SVG to OM1, SVG to RCA-RPL, EVH via right thigh and leg  . Type II or unspecified type diabetes mellitus without mention of complication, not stated as uncontrolled, borderline   :  Past Surgical History:  Procedure Laterality Date  . CORONARY ARTERY BYPASS GRAFT N/A 02/13/2014   Procedure: CORONARY ARTERY BYPASS GRAFTING (CABG) TIMES FOUR USING LEFT INTERNAL MAMMARY ARTERY AND RIGHT SAPHENOUS LEG VEIN HARVESTED ENDOSCOPICALLY;  Surgeon: Rexene Alberts, MD;  Location: [place];  Service: Open Heart Surgery;  Laterality: N/A;  . INTRAOPERATIVE TRANSESOPHAGEAL ECHOCARDIOGRAM N/A 02/13/2014   Procedure: INTRAOPERATIVE TRANSESOPHAGEAL ECHOCARDIOGRAM;  Surgeon: Rexene Alberts, MD;  Location: [place];  Service: Open Heart Surgery;  Laterality: N/A;  . LEFT HEART CATHETERIZATION WITH CORONARY ANGIOGRAM N/A 02/12/2014   Procedure: LEFT HEART CATHETERIZATION WITH CORONARY ANGIOGRAM;  Surgeon: Blane Ohara, MD;  Location: [facility] CATH LAB;  Service: Cardiovascular;  Laterality: N/A;  :   Current Outpatient Prescriptions:  .  aspirin EC 325 MG tablet, Take 1 tablet (325 mg total) by mouth daily., Disp: 1 tablet, Rfl: 0 .  atorvastatin (LIPITOR) 80 MG tablet, Take 1 tablet (80 mg total) by mouth at bedtime., Disp: 90 tablet, Rfl: 2 .  Cinnamon 500 MG capsule, Take 1,000 mg by mouth  daily. Take 2 Capsule Equal 1000 IU, Disp: , Rfl:  .  ezetimibe (ZETIA) 10 MG tablet, Take 1 tablet (10 mg total) by mouth daily., Disp: 90 tablet, Rfl: 2 .  metoprolol tartrate (LOPRESSOR) 25 MG tablet, Take 1.5 tablets (37.5 mg total) by mouth 2 (two) times daily., Disp: 270 tablet, Rfl: 2 .  Multiple  Vitamin (MULTIVITAMIN WITH MINERALS) TABS tablet, Take 1 tablet by mouth daily., Disp: , Rfl:  .  Omega-3 Krill Oil 500 MG CAPS, Take 1 capsule by mouth daily., Disp: , Rfl:  .  albuterol (PROAIR HFA) 108 (90 BASE) MCG/ACT inhaler, Inhale 1-2 puffs into the lungs daily as needed for wheezing or shortness of breath., Disp: , Rfl:  .  fluticasone (FLONASE) 50 MCG/ACT nasal spray, Place 2 sprays into both nostrils at bedtime., Disp: , Rfl:  .  mometasone-formoterol (DULERA) 200-5 MCG/ACT AERO, Inhale 2 puffs into the lungs 2 (two) times daily., Disp: , Rfl:  .  nitroGLYCERIN (NITROSTAT) 0.4 MG SL tablet, Place 1 tablet (0.4 mg total) under the tongue every 5 (five) minutes as needed for chest pain. (Patient not taking: Reported on 09/08/2016), Disp: 25 tablet, Rfl: 3:  No Known Allergies:  Family History  Problem Relation Age of Onset  . Alzheimer's disease Mother   . Cancer - Lung Father   . Heart attack Maternal Grandfather   . Stroke Mother   . Cancer Father   :  Social History   Social History  . Marital status: Single    Spouse name: N/A  . Number of children: N/A  . Years of education: N/A   Occupational History  . Not on file.   Social History Main Topics  . Smoking status: Never Smoker  . Smokeless tobacco: Not on file  . Alcohol use No  . Drug use: No  . Sexual activity: Not on file   Other Topics Concern  . Not on file   Social History Narrative  . No narrative on file  :  Pertinent items are noted in HPI.  Exam: Blood pressure (!) 147/69, pulse 70, temperature 97.8 F (36.6 C), temperature source Oral, resp. rate 18, height 5\' 8"  (1.727 m), weight 173 lb 6.4 oz (78.7 kg), SpO2 97 %.  ECOG 0.  General appearance: alert and cooperative appeared without distress. Throat: No oral thrush or ulcers. Neck: no adenopathy Back: negative Resp: clear to auscultation bilaterally Cardio: regular rate and rhythm, S1, S2 normal, no murmur, click, rub or gallop GI: soft,  non-tender; bowel sounds normal; no masses,  no organomegaly Extremities: extremities normal, atraumatic, no cyanosis or edema Pulses: 2+ and symmetric Skin: Skin color, texture, turgor normal. No rashes or lesions  CBC    Component Value Date/Time   WBC 19.7 (H) 02/18/2014 1023   RBC 3.25 (L) 02/18/2014 1023   HGB 9.1 (L) 02/18/2014 1023   HCT 27.5 (L) 02/18/2014 1023   PLT 465 (H) 02/18/2014 1023   MCV 84.6 02/18/2014 1023   MCH 28.0 02/18/2014 1023   MCHC 33.1 02/18/2014 1023   RDW 13.8 02/18/2014 1023     Chemistry      Component Value Date/Time   NA 138 02/18/2014 1023   K 5.4 (H) 02/18/2014 1023   CL 101 02/18/2014 1023   CO2 24 02/18/2014 1023   BUN 16 02/18/2014 1023   CREATININE 0.80 02/18/2014 1023      Component Value Date/Time   CALCIUM 9.1 02/18/2014 1023   ALKPHOS 69 05/03/2016 0909  AST 28 05/03/2016 0909   ALT 38 05/03/2016 0909   BILITOT 0.7 05/03/2016 0909       Assessment and Plan:   66 year old gentleman with the following issues:  1. Leukocytosis that has been fluctuating at least for the last 3 years. CBC obtained in February 2018 showed white cell count of 15.5 with mild neutrophilia. His hemoglobin, platelet counts and differential otherwise normal. He did have elevation in his white cell count during his hospitalization in October 2015 during a bypass graft operation.  The differential diagnosis was reviewed today with the patient. This most likely represent a reactive leukocytosis rather than a primary hematological condition. This could be due to his recent asthma, allergy or stress. Myeloproliferative disorder such as chronic myelogenous leukemia would also be a possibility but considered less likely. Other myeloproliferative disorders also considered less likely.  Leukocytosis related to an occult malignancy were also considered less likely.  From a management standpoint I recommended observation and surveillance for the time being. I like  to repeat his CBC in 6 months and also screen him for chronic myelogenous leukemia. Based on these tests in 6 months will determine the final diagnosis. Likely we are dealing with benign reactive leukocytosis rather than a hematological condition.  2. Prostate cancer: He appears to have low-grade tumor that has been followed periodically. I do not think this is contributing to his leukocytosis.  3. Follow-up: Will be in 6 months to repeat his CBC.

## 2016-09-08 NOTE — Telephone Encounter (Signed)
Gave patient avs report and appointments for October and November.  °

## 2016-11-09 ENCOUNTER — Observation Stay (HOSPITAL_COMMUNITY)
Admission: EM | Admit: 2016-11-09 | Discharge: 2016-11-11 | Disposition: A | Discharge status: Home / Self Care | Payer: Medicare Other | Attending: Internal Medicine | Admitting: Internal Medicine

## 2016-11-09 ENCOUNTER — Encounter (HOSPITAL_COMMUNITY): Payer: Self-pay | Admitting: *Deleted

## 2016-11-09 DIAGNOSIS — E785 Hyperlipidemia, unspecified: Secondary | ICD-10-CM | POA: Diagnosis present

## 2016-11-09 DIAGNOSIS — Z951 Presence of aortocoronary bypass graft: Secondary | ICD-10-CM | POA: Diagnosis not present

## 2016-11-09 DIAGNOSIS — K625 Hemorrhage of anus and rectum: Principal | ICD-10-CM | POA: Insufficient documentation

## 2016-11-09 DIAGNOSIS — J45909 Unspecified asthma, uncomplicated: Secondary | ICD-10-CM | POA: Diagnosis not present

## 2016-11-09 DIAGNOSIS — Z7982 Long term (current) use of aspirin: Secondary | ICD-10-CM | POA: Insufficient documentation

## 2016-11-09 DIAGNOSIS — Z79899 Other long term (current) drug therapy: Secondary | ICD-10-CM | POA: Diagnosis not present

## 2016-11-09 DIAGNOSIS — I25810 Atherosclerosis of coronary artery bypass graft(s) without angina pectoris: Secondary | ICD-10-CM | POA: Insufficient documentation

## 2016-11-09 DIAGNOSIS — E119 Type 2 diabetes mellitus without complications: Secondary | ICD-10-CM | POA: Diagnosis not present

## 2016-11-09 DIAGNOSIS — D72829 Elevated white blood cell count, unspecified: Secondary | ICD-10-CM | POA: Diagnosis present

## 2016-11-09 LAB — COMPREHENSIVE METABOLIC PANEL
ALBUMIN: 3.5 g/dL (ref 3.5–5.0)
ALT: 32 U/L (ref 17–63)
AST: 30 U/L (ref 15–41)
Alkaline Phosphatase: 70 U/L (ref 38–126)
Anion gap: 6 (ref 5–15)
BUN: 16 mg/dL (ref 6–20)
CHLORIDE: 107 mmol/L (ref 101–111)
CO2: 27 mmol/L (ref 22–32)
CREATININE: 0.98 mg/dL (ref 0.61–1.24)
Calcium: 8.9 mg/dL (ref 8.9–10.3)
GFR calc Af Amer: >60 mL/min (ref >60)
GLUCOSE: 127 mg/dL — AB (ref 65–99)
Potassium: 4.2 mmol/L (ref 3.5–5.1)
Sodium: 140 mmol/L (ref 135–145)
Total Bilirubin: 0.6 mg/dL (ref 0.3–1.2)
Total Protein: 6 g/dL — ABNORMAL LOW (ref 6.5–8.1)

## 2016-11-09 LAB — POC OCCULT BLOOD, ED: Fecal Occult Bld: POSITIVE — AB

## 2016-11-09 LAB — CBC
HEMATOCRIT: 39.4 % (ref 39.0–52.0)
Hemoglobin: 12.9 g/dL — ABNORMAL LOW (ref 13.0–17.0)
MCH: 28.5 pg (ref 26.0–34.0)
MCHC: 32.7 g/dL (ref 30.0–36.0)
MCV: 87.2 fL (ref 78.0–100.0)
PLATELETS: 265 10*3/uL (ref 150–400)
RBC: 4.52 MIL/uL (ref 4.22–5.81)
RDW: 13.8 % (ref 11.5–15.5)
WBC: 13.9 10*3/uL — AB (ref 4.0–10.5)

## 2016-11-09 LAB — TYPE AND SCREEN
ABO/RH(D): O POS
Antibody Screen: NEGATIVE

## 2016-11-09 NOTE — ED Triage Notes (Signed)
Pt arrives with c/o rectal bleeding. Pt states he feels like he's having diarrhea and when he wipes there is blood on the toilet paper. Pt also states he had a colonoscopy almost 2 weeks ago and just began taking his ASA again on Sat. Pt denies pain. VS WNL.

## 2016-11-10 ENCOUNTER — Encounter (HOSPITAL_COMMUNITY): Payer: Self-pay

## 2016-11-10 DIAGNOSIS — I2510 Atherosclerotic heart disease of native coronary artery without angina pectoris: Secondary | ICD-10-CM | POA: Diagnosis not present

## 2016-11-10 DIAGNOSIS — D72829 Elevated white blood cell count, unspecified: Secondary | ICD-10-CM | POA: Diagnosis present

## 2016-11-10 DIAGNOSIS — K625 Hemorrhage of anus and rectum: Secondary | ICD-10-CM | POA: Diagnosis present

## 2016-11-10 DIAGNOSIS — E119 Type 2 diabetes mellitus without complications: Secondary | ICD-10-CM | POA: Diagnosis not present

## 2016-11-10 DIAGNOSIS — Z951 Presence of aortocoronary bypass graft: Secondary | ICD-10-CM

## 2016-11-10 LAB — CBC
HEMATOCRIT: 33.5 % — AB (ref 39.0–52.0)
HEMATOCRIT: 34.6 % — AB (ref 39.0–52.0)
HEMATOCRIT: 29.9 % — AB (ref 39.0–52.0)
HEMOGLOBIN: 11 g/dL — AB (ref 13.0–17.0)
Hemoglobin: 11.5 g/dL — ABNORMAL LOW (ref 13.0–17.0)
Hemoglobin: 9.7 g/dL — ABNORMAL LOW (ref 13.0–17.0)
MCH: 28.9 pg (ref 26.0–34.0)
MCH: 29.7 pg (ref 26.0–34.0)
MCH: 28.4 pg (ref 26.0–34.0)
MCHC: 32.8 g/dL (ref 30.0–36.0)
MCHC: 33.2 g/dL (ref 30.0–36.0)
MCHC: 32.4 g/dL (ref 30.0–36.0)
MCV: 87.9 fL (ref 78.0–100.0)
MCV: 89.4 fL (ref 78.0–100.0)
MCV: 87.4 fL (ref 78.0–100.0)
PLATELETS: 275 10*3/uL (ref 150–400)
Platelets: 235 10*3/uL (ref 150–400)
Platelets: 227 10*3/uL (ref 150–400)
RBC: 3.81 MIL/uL — AB (ref 4.22–5.81)
RBC: 3.87 MIL/uL — ABNORMAL LOW (ref 4.22–5.81)
RBC: 3.42 MIL/uL — ABNORMAL LOW (ref 4.22–5.81)
RDW: 14.2 % (ref 11.5–15.5)
RDW: 14.5 % (ref 11.5–15.5)
RDW: 14.1 % (ref 11.5–15.5)
WBC: 16.4 10*3/uL — ABNORMAL HIGH (ref 4.0–10.5)
WBC: 19.5 10*3/uL — ABNORMAL HIGH (ref 4.0–10.5)
WBC: 12.7 10*3/uL — ABNORMAL HIGH (ref 4.0–10.5)

## 2016-11-10 LAB — PROTIME-INR
INR: 1.08
Prothrombin Time: 14 seconds (ref 11.4–15.2)

## 2016-11-10 LAB — GLUCOSE, CAPILLARY: GLUCOSE-CAPILLARY: 116 mg/dL — AB (ref 65–99)

## 2016-11-10 LAB — APTT: aPTT: 24 seconds (ref 24–36)

## 2016-11-10 MED ORDER — SODIUM CHLORIDE 0.9 % IV BOLUS (SEPSIS)
1000.0000 mL | Freq: Once | INTRAVENOUS | Status: AC
  Administered 2016-11-10: 1000 mL via INTRAVENOUS

## 2016-11-10 MED ORDER — EZETIMIBE 10 MG PO TABS
10.0000 mg | ORAL_TABLET | Freq: Every day | ORAL | Status: DC
  Administered 2016-11-10 – 2016-11-11 (×2): 10 mg via ORAL
  Filled 2016-11-10 (×2): qty 1

## 2016-11-10 MED ORDER — ZOLPIDEM TARTRATE 5 MG PO TABS
5.0000 mg | ORAL_TABLET | Freq: Every evening | ORAL | Status: DC | PRN
Start: 2016-11-10 — End: 2016-11-11

## 2016-11-10 MED ORDER — FLUTICASONE PROPIONATE 50 MCG/ACT NA SUSP
2.0000 | Freq: Every day | NASAL | Status: DC
  Administered 2016-11-10: 2 via NASAL
  Filled 2016-11-10: qty 16

## 2016-11-10 MED ORDER — OMEGA-3 KRILL OIL 500 MG PO CAPS: 1.0000 | ORAL_CAPSULE | Freq: Every day | ORAL | Status: DC

## 2016-11-10 MED ORDER — ACETAMINOPHEN 325 MG PO TABS: 650.0000 mg | ORAL_TABLET | Freq: Four times a day (QID) | ORAL | Status: DC | PRN

## 2016-11-10 MED ORDER — ATORVASTATIN CALCIUM 80 MG PO TABS
80.0000 mg | ORAL_TABLET | Freq: Every day | ORAL | Status: DC
  Administered 2016-11-10: 80 mg via ORAL
  Filled 2016-11-10: qty 1

## 2016-11-10 MED ORDER — SODIUM CHLORIDE 0.9% FLUSH
3.0000 mL | Freq: Two times a day (BID) | INTRAVENOUS | Status: DC
  Administered 2016-11-10 – 2016-11-11 (×4): 3 mL via INTRAVENOUS

## 2016-11-10 MED ORDER — SODIUM CHLORIDE 0.9 % IV SOLN
INTRAVENOUS | Status: DC
  Administered 2016-11-10 (×2): via INTRAVENOUS

## 2016-11-10 MED ORDER — CINNAMON 500 MG PO CAPS: 1000.0000 mg | ORAL_CAPSULE | Freq: Every day | ORAL | Status: DC

## 2016-11-10 MED ORDER — ACETAMINOPHEN 650 MG RE SUPP: 650.0000 mg | Freq: Four times a day (QID) | RECTAL | Status: DC | PRN

## 2016-11-10 MED ORDER — ALBUTEROL SULFATE (2.5 MG/3ML) 0.083% IN NEBU: 2.5000 mg | INHALATION_SOLUTION | RESPIRATORY_TRACT | Status: DC | PRN

## 2016-11-10 MED ORDER — ADULT MULTIVITAMIN W/MINERALS CH
1.0000 | ORAL_TABLET | Freq: Every day | ORAL | Status: DC
  Administered 2016-11-10 – 2016-11-11 (×2): 1 via ORAL
  Filled 2016-11-10 (×2): qty 1

## 2016-11-10 MED ORDER — MOMETASONE FURO-FORMOTEROL FUM 200-5 MCG/ACT IN AERO
2.0000 | INHALATION_SPRAY | Freq: Two times a day (BID) | RESPIRATORY_TRACT | Status: DC
  Administered 2016-11-10 – 2016-11-11 (×3): 2 via RESPIRATORY_TRACT
  Filled 2016-11-10: qty 8.8

## 2016-11-10 MED ORDER — METOPROLOL TARTRATE 25 MG PO TABS
37.5000 mg | ORAL_TABLET | Freq: Two times a day (BID) | ORAL | Status: DC
  Administered 2016-11-11: 37.5 mg via ORAL
  Filled 2016-11-10: qty 2

## 2016-11-10 MED ORDER — ONDANSETRON HCL 4 MG/2ML IJ SOLN
4.0000 mg | Freq: Three times a day (TID) | INTRAMUSCULAR | Status: DC | PRN
Start: 2016-11-10 — End: 2016-11-11

## 2016-11-10 MED ORDER — DM-GUAIFENESIN ER 30-600 MG PO TB12
1.0000 | ORAL_TABLET | Freq: Two times a day (BID) | ORAL | Status: DC
  Administered 2016-11-10 – 2016-11-11 (×4): 1 via ORAL
  Filled 2016-11-10 (×4): qty 1

## 2016-11-10 MED ORDER — PANTOPRAZOLE SODIUM 40 MG IV SOLR
40.0000 mg | Freq: Two times a day (BID) | INTRAVENOUS | Status: DC
Start: 2016-11-10 — End: 2016-11-11
  Administered 2016-11-10 – 2016-11-11 (×4): 40 mg via INTRAVENOUS
  Filled 2016-11-10 (×4): qty 40

## 2016-11-10 MED ORDER — NITROGLYCERIN 0.4 MG SL SUBL: 0.4000 mg | SUBLINGUAL_TABLET | SUBLINGUAL | Status: DC | PRN

## 2016-11-10 NOTE — Consult Note (Signed)
[facility] Consult Note  Referring Provider: No ref. provider found Primary Care Physician:  Shirline Frees, MD Primary Gastroenterologist:  Dr.  Laurel Dimmer Complaint: Painless hematochezia HPI: Melvin Dunn is an 66 y.o. white male  who developed painless hematochezia for proximally 6 episodes moderate amount from about 4 PM yesterday to 11 PM when he came to the emergency room. He was found to have a hemoglobin of 12.9. He had a colonoscopy 12 days ago and reportedly 2 polyps were removed. The patient began restarting his aspirin 4 days ago. He denies any abdominal pain weakness orthostasis.  Past Medical History:  Diagnosis Date  . Adenocarcinoma ([place])    of the prostate, 5% of single core 01/2011-watching and waiting for now  . Aortic stenosis 02/11/2014  . Asthma   . Colon polyp   . Coronary artery disease involving native coronary artery with angina pectoris ([place]) 02/12/2014  . Environmental allergies   . Hyperlipemia   . Hyperlipidemia   . Kidney stone   . Left main coronary artery disease 02/12/2014  . Prediabetes   . S/P CABG x 4 02/13/2014   LIMA to LAD, SVG to OM1, SVG to RCA-RPL, EVH via right thigh and leg  . Type II or unspecified type diabetes mellitus without mention of complication, not stated as uncontrolled, borderline     Past Surgical History:  Procedure Laterality Date  . CORONARY ARTERY BYPASS GRAFT N/A 02/13/2014   Procedure: CORONARY ARTERY BYPASS GRAFTING (CABG) TIMES FOUR USING LEFT INTERNAL MAMMARY ARTERY AND RIGHT SAPHENOUS LEG VEIN HARVESTED ENDOSCOPICALLY;  Surgeon: Rexene Alberts, MD;  Location: [place];  Service: Open Heart Surgery;  Laterality: N/A;  . INTRAOPERATIVE TRANSESOPHAGEAL ECHOCARDIOGRAM N/A 02/13/2014   Procedure: INTRAOPERATIVE TRANSESOPHAGEAL ECHOCARDIOGRAM;  Surgeon: Rexene Alberts, MD;  Location: [place];  Service: Open Heart Surgery;  Laterality: N/A;  . LEFT HEART CATHETERIZATION WITH CORONARY ANGIOGRAM N/A 02/12/2014   Procedure:  LEFT HEART CATHETERIZATION WITH CORONARY ANGIOGRAM;  Surgeon: Blane Ohara, MD;  Location: [facility] CATH LAB;  Service: Cardiovascular;  Laterality: N/A;    Medications Prior to Admission  Medication Sig Dispense Refill  . albuterol (PROAIR HFA) 108 (90 BASE) MCG/ACT inhaler Inhale 1-2 puffs into the lungs daily as needed for wheezing or shortness of breath.    Marland Kitchen aspirin EC 325 MG tablet Take 1 tablet (325 mg total) by mouth daily. 1 tablet 0  . atorvastatin (LIPITOR) 80 MG tablet Take 1 tablet (80 mg total) by mouth at bedtime. 90 tablet 2  . benzonatate (TESSALON) 100 MG capsule Take 100 mg by mouth 3 (three) times daily as needed for cough.    . Cinnamon 500 MG capsule Take 1,000 mg by mouth daily. Take 2 Capsule Equal 1000 IU    . ezetimibe (ZETIA) 10 MG tablet Take 1 tablet (10 mg total) by mouth daily. 90 tablet 2  . fluticasone (FLONASE) 50 MCG/ACT nasal spray Place 2 sprays into both nostrils at bedtime.    . metoprolol tartrate (LOPRESSOR) 25 MG tablet Take 1.5 tablets (37.5 mg total) by mouth 2 (two) times daily. 270 tablet 2  . mometasone-formoterol (DULERA) 200-5 MCG/ACT AERO Inhale 2 puffs into the lungs 2 (two) times daily as needed for wheezing or shortness of breath.     . Multiple Vitamin (MULTIVITAMIN WITH MINERALS) TABS tablet Take 1 tablet by mouth daily.    . nitroGLYCERIN (NITROSTAT) 0.4 MG SL tablet Place 1 tablet (0.4 mg total) under the tongue every 5 (five) minutes  as needed for chest pain. 25 tablet 3  . Omega-3 Krill Oil 500 MG CAPS Take 1 capsule by mouth daily.      Allergies: No Known Allergies  Family History  Problem Relation Age of Onset  . Alzheimer's disease Mother   . Stroke Mother   . Cancer - Lung Father   . Cancer Father   . Heart attack Maternal Grandfather     Social History:  reports that he has never smoked. He has never used smokeless tobacco. He reports that he does not drink alcohol or use drugs.  Review of Systems: negative except As  above  Blood pressure (!) 112/56, pulse 84, temperature 98 F (36.7 C), temperature source Oral, resp. rate 16, height 5' 7"  (1.702 m), weight 74.8 kg (165 lb), SpO2 98 %. Head: Normocephalic, without obvious abnormality, atraumatic Neck: no adenopathy, no carotid bruit, no JVD, supple, symmetrical, trachea midline and thyroid not enlarged, symmetric, no tenderness/mass/nodules Resp: clear to auscultation bilaterally Cardio: regular rate and rhythm, S1, S2 normal, no murmur, click, rub or gallop GI: Abdomen soft nondistended with normoactive bowel sounds. No hepatosplenomegaly mass or guarding  Extremities: extremities normal, atraumatic, no cyanosis or edema  Results for orders placed or performed during the hospital encounter of 11/09/16 (from the past 48 hour(s))  Comprehensive metabolic panel     Status: Abnormal   Collection Time: 11/09/16  9:30 PM  Result Value Ref Range   Sodium 140 135 - 145 mmol/L   Potassium 4.2 3.5 - 5.1 mmol/L   Chloride 107 101 - 111 mmol/L   CO2 27 22 - 32 mmol/L   Glucose, Bld 127 (H) 65 - 99 mg/dL   BUN 16 6 - 20 mg/dL   Creatinine, Ser 0.98 0.61 - 1.24 mg/dL   Calcium 8.9 8.9 - 10.3 mg/dL   Total Protein 6.0 (L) 6.5 - 8.1 g/dL   Albumin 3.5 3.5 - 5.0 g/dL   AST 30 15 - 41 U/L   ALT 32 17 - 63 U/L   Alkaline Phosphatase 70 38 - 126 U/L   Total Bilirubin 0.6 0.3 - 1.2 mg/dL   GFR calc non Af Amer >60 >60 mL/min   GFR calc Af Amer >60 >60 mL/min    Comment: (NOTE) The eGFR has been calculated using the CKD EPI equation. This calculation has not been validated in all clinical situations. eGFR's persistently <60 mL/min signify possible Chronic Kidney Disease.    Anion gap 6 5 - 15  CBC     Status: Abnormal   Collection Time: 11/09/16  9:30 PM  Result Value Ref Range   WBC 13.9 (H) 4.0 - 10.5 K/uL   RBC 4.52 4.22 - 5.81 MIL/uL   Hemoglobin 12.9 (L) 13.0 - 17.0 g/dL   HCT 39.4 39.0 - 52.0 %   MCV 87.2 78.0 - 100.0 fL   MCH 28.5 26.0 - 34.0 pg    MCHC 32.7 30.0 - 36.0 g/dL   RDW 13.8 11.5 - 15.5 %   Platelets 265 150 - 400 K/uL  Type and screen [place]     Status: None   Collection Time: 11/09/16  9:30 PM  Result Value Ref Range   ABO/RH(D) O POS    Antibody Screen NEG    Sample Expiration 11/12/2016   POC occult blood, ED     Status: Abnormal   Collection Time: 11/09/16 11:53 PM  Result Value Ref Range   Fecal Occult Bld POSITIVE (A) NEGATIVE  Protime-INR     Status: None   Collection Time: 11/10/16  4:51 AM  Result Value Ref Range   Prothrombin Time 14.0 11.4 - 15.2 seconds   INR 1.08   APTT     Status: None   Collection Time: 11/10/16  4:51 AM  Result Value Ref Range   aPTT 24 24 - 36 seconds  CBC     Status: Abnormal   Collection Time: 11/10/16  4:51 AM  Result Value Ref Range   WBC 16.4 (H) 4.0 - 10.5 K/uL   RBC 3.81 (L) 4.22 - 5.81 MIL/uL   Hemoglobin 11.0 (L) 13.0 - 17.0 g/dL   HCT 33.5 (L) 39.0 - 52.0 %   MCV 87.9 78.0 - 100.0 fL   MCH 28.9 26.0 - 34.0 pg   MCHC 32.8 30.0 - 36.0 g/dL   RDW 14.2 11.5 - 15.5 %   Platelets 235 150 - 400 K/uL  CBC     Status: Abnormal   Collection Time: 11/10/16  7:53 AM  Result Value Ref Range   WBC 19.5 (H) 4.0 - 10.5 K/uL    Comment: WHITE COUNT CONFIRMED ON SMEAR   RBC 3.87 (L) 4.22 - 5.81 MIL/uL   Hemoglobin 11.5 (L) 13.0 - 17.0 g/dL   HCT 34.6 (L) 39.0 - 52.0 %   MCV 89.4 78.0 - 100.0 fL   MCH 29.7 26.0 - 34.0 pg   MCHC 33.2 30.0 - 36.0 g/dL   RDW 14.5 11.5 - 15.5 %   Platelets 275 150 - 400 K/uL  Glucose, capillary     Status: Abnormal   Collection Time: 11/10/16  8:23 AM  Result Value Ref Range   Glucose-Capillary 116 (H) 65 - 99 mg/dL   No results found.  Assessment: Likely post polypectomy colonic bleed, not destabilizing at present.  Plan:  Given no bowel movement for 11 hours will allow clear liquid diet and simply monitor stools and hemoglobin today We'll reassess tomorrow regarding possible need for therapeutic colonoscopy.   Wyn Nettle,Giovani C 11/10/2016, 10:00 AM  Pager [phone] If no answer or after 5 PM call [phone]

## 2016-11-10 NOTE — ED Provider Notes (Signed)
[place] DEPT Provider Note   CSN: [id number] Arrival date & time: 11/09/16  2042     History   Chief Complaint Chief Complaint  Patient presents with  . Rectal Bleeding    HPI Melvin Dunn is a 66 y.o. male.  Patient presents to the emergency department with chief complaint of rectal bleeding. He reports that he has had several episodes of bright red blood per rectum today. He reports having had a colonoscopy 2 weeks ago.  He had 2 polyps removed. He is not anticoagulated, but does take aspirin. He resumed taking aspirin 3 days ago. He denies any chest pain or shortness of breath. He denies any fevers. Denies abdominal pain. There are no other associated symptoms. He called his gastroenterologist, and they made an appointment for him to be seen tomorrow morning. He states when the rectal bleeding continued, he came to the emergency department.   The history is provided by the patient. No language interpreter was used.    Past Medical History:  Diagnosis Date  . Adenocarcinoma ([place])    of the prostate, 5% of single core 01/2011-watching and waiting for now  . Aortic stenosis 02/11/2014  . Asthma   . Colon polyp   . Coronary artery disease involving native coronary artery with angina pectoris ([place]) 02/12/2014  . Environmental allergies   . Hyperlipemia   . Hyperlipidemia   . Kidney stone   . Left main coronary artery disease 02/12/2014  . Prediabetes   . S/P CABG x 4 02/13/2014   LIMA to LAD, SVG to OM1, SVG to RCA-RPL, EVH via right thigh and leg  . Type II or unspecified type diabetes mellitus without mention of complication, not stated as uncontrolled, borderline     Patient Active Problem List   Diagnosis Date Noted  . S/P CABG x 4 02/13/2014  . Type II or unspecified type diabetes mellitus without mention of complication, not stated as uncontrolled, borderline 02/12/2014  . Left main coronary artery disease 02/12/2014  . CAD (coronary artery disease) 02/12/2014  .  Aortic stenosis 02/11/2014  . Hyperlipemia 01/29/2014  . Heart murmur 01/29/2014    Past Surgical History:  Procedure Laterality Date  . CORONARY ARTERY BYPASS GRAFT N/A 02/13/2014   Procedure: CORONARY ARTERY BYPASS GRAFTING (CABG) TIMES FOUR USING LEFT INTERNAL MAMMARY ARTERY AND RIGHT SAPHENOUS LEG VEIN HARVESTED ENDOSCOPICALLY;  Surgeon: Rexene Alberts, MD;  Location: [facility];  Service: Open Heart Surgery;  Laterality: N/A;  . INTRAOPERATIVE TRANSESOPHAGEAL ECHOCARDIOGRAM N/A 02/13/2014   Procedure: INTRAOPERATIVE TRANSESOPHAGEAL ECHOCARDIOGRAM;  Surgeon: Rexene Alberts, MD;  Location: [facility];  Service: Open Heart Surgery;  Laterality: N/A;  . LEFT HEART CATHETERIZATION WITH CORONARY ANGIOGRAM N/A 02/12/2014   Procedure: LEFT HEART CATHETERIZATION WITH CORONARY ANGIOGRAM;  Surgeon: Blane Ohara, MD;  Location: [facility] C[facility] LAB;  Service: Cardiovascular;  Laterality: N/A;       Home Medications    Prior to Admission medications   Medication Sig Start Date End Date Taking? Authorizing Provider  albuterol (PROAIR HFA) 108 (90 BASE) MCG/ACT inhaler Inhale 1-2 puffs into the lungs daily as needed for wheezing or shortness of breath.    [provider]  aspirin EC 325 MG tablet Take 1 tablet (325 mg total) by mouth daily. 02/11/14   Sherren Mocha, MD  atorvastatin (LIPITOR) 80 MG tablet Take 1 tablet (80 mg total) by mouth at bedtime. 07/26/16   Sueanne Margarita, MD  Cinnamon 500 MG capsule Take 1,000 mg by  mouth daily. Take 2 Capsule Equal 1000 IU    [provider]  ezetimibe (ZETIA) 10 MG tablet Take 1 tablet (10 mg total) by mouth daily. 07/26/16 10/24/16  Sueanne Margarita, MD  fluticasone (FLONASE) 50 MCG/ACT nasal spray Place 2 sprays into both nostrils at bedtime.    [provider]  metoprolol tartrate (LOPRESSOR) 25 MG tablet Take 1.5 tablets (37.5 mg total) by mouth 2 (two) times daily. 07/26/16   Sueanne Margarita, MD  mometasone-formoterol (DULERA) 200-5 MCG/ACT  AERO Inhale 2 puffs into the lungs 2 (two) times daily.    [provider]  Multiple Vitamin (MULTIVITAMIN WITH MINERALS) TABS tablet Take 1 tablet by mouth daily.    [provider]  nitroGLYCERIN (NITROSTAT) 0.4 MG SL tablet Place 1 tablet (0.4 mg total) under the tongue every 5 (five) minutes as needed for chest pain. Patient not taking: Reported on 09/08/2016 01/24/15   Sueanne Margarita, MD  Omega-3 Krill Oil 500 MG CAPS Take 1 capsule by mouth daily.    [provider]    Family History Family History  Problem Relation Age of Onset  . Alzheimer's disease Mother   . Stroke Mother   . Cancer - Lung Father   . Cancer Father   . Heart attack Maternal Grandfather     Social History Social History  Substance Use Topics  . Smoking status: Never Smoker  . Smokeless tobacco: Not on file  . Alcohol use No     Allergies   Patient has no known allergies.   Review of Systems Review of Systems  All other systems reviewed and are negative.    Physical Exam Updated Vital Signs BP 139/81 (BP Location: Left Arm)   Pulse 88   Temp 97.9 F (36.6 C) (Oral)   Resp 16   Ht 5\' 7"  (1.702 m)   Wt 79.4 kg (175 lb)   SpO2 97%   BMI 27.41 kg/m   Physical Exam  Constitutional: He is oriented to person, place, and time. He appears well-developed and well-nourished.  HENT:  Head: Normocephalic and atraumatic.  Eyes: Conjunctivae and EOM are normal. Pupils are equal, round, and reactive to light. Right eye exhibits no discharge. Left eye exhibits no discharge. No scleral icterus.  Neck: Normal range of motion. Neck supple. No JVD present.  Cardiovascular: Normal rate, regular rhythm and normal heart sounds.  Exam reveals no gallop and no friction rub.   No murmur heard. Pulmonary/Chest: Effort normal and breath sounds normal. No respiratory distress. He has no wheezes. He has no rales. He exhibits no tenderness.  Abdominal: Soft. He exhibits no distension and no  mass. There is no tenderness. There is no rebound and no guarding.  Genitourinary:  Genitourinary Comments: Chaperone present for exam, blood on DRE  Musculoskeletal: Normal range of motion. He exhibits no edema or tenderness.  Neurological: He is alert and oriented to person, place, and time.  Skin: Skin is warm and dry.  Psychiatric: He has a normal mood and affect. His behavior is normal. Judgment and thought content normal.  Nursing note and vitals reviewed.    ED Treatments / Results  Labs (all labs ordered are listed, but only abnormal results are displayed) Labs Reviewed  COMPREHENSIVE METABOLIC PANEL - Abnormal; Notable for the following:       Result Value   Glucose, Bld 127 (*)    Total Protein 6.0 (*)    All other components within normal limits  CBC -  Abnormal; Notable for the following:    WBC 13.9 (*)    Hemoglobin 12.9 (*)    All other components within normal limits  POC OCCULT BLOOD, ED - Abnormal; Notable for the following:    Fecal Occult Bld POSITIVE (*)    All other components within normal limits  PROTIME-INR  APTT  CBC  CBC  CBC  CBC  TYPE AND SCREEN    EKG  EKG Interpretation None       Radiology No results found.  Procedures Procedures (including critical care time)  Medications Ordered in ED Medications - No data to display   Initial Impression / Assessment and Plan / ED Course  I have reviewed the triage vital signs and the nursing notes.  Pertinent labs & imaging results that were available during my care of the patient were reviewed by me and considered in my medical decision making (see chart for details).    Patient with rectal bleeding. Recent colonoscopy 2 weeks ago with polyp removal. Patient takes aspirin, and recently restarted this on Saturday. His blood pressure stable. His hemoglobin is 12.9. Feel the patient should be brought into the hospital for trending of hemoglobins.  Appreciate Dr. Blaine Hamper, who will bring patient  into the hospital.  Final Clinical Impressions(s) / ED Diagnoses   Final diagnoses:  Rectal bleeding    New Prescriptions New Prescriptions   No medications on file     Delaine Lame 11/10/16 0139    Jola Schmidt, MD 11/10/16 [phone]

## 2016-11-10 NOTE — Progress Notes (Signed)
New Admission Note:  Arrival Method: Via stretcher from ED Mental Orientation: Alert & Oriented x4  Telemetry: CCMD verified Assessment: Completed Skin: Refer to flowsheet  IV: Right Forearm. Pain: 0/10 Tubes: None Safety Measures: Safety Fall Prevention Plan discussed with patient. Admission: Completed [facility] [place] Orientation: Patient has been orientated to the room, unit and the staff.  Orders have been reviewed and implemented. Will continue to monitor the patient. Call light has been placed within reach.   Vassie Moselle, RN  Phone Number: [phone]

## 2016-11-10 NOTE — Progress Notes (Signed)
PHARMACIST - PHYSICIAN ORDER COMMUNICATION  CONCERNING: P&T Medication Policy on Herbal Medications  DESCRIPTION:  This patient's order for:  Astrid Drafts and Cinnamon has been noted.  This product(s) is classified as an "herbal" or natural product. Due to a lack of definitive safety studies or FDA approval, nonstandard manufacturing practices, plus the potential risk of unknown drug-drug interactions while on inpatient medications, the Pharmacy and Therapeutics Committee does not permit the use of "herbal" or natural products of this type within [facility].   ACTION TAKEN: The pharmacy department is unable to verify this order at this time and your patient has been informed of this safety policy. Please reevaluate patient's clinical condition at discharge and address if the herbal or natural product(s) should be resumed at that time.

## 2016-11-10 NOTE — Progress Notes (Signed)
Patient admitted after midnight. Please see earlier admission note by Dr. Blaine Hamper. Pt admitted for symptomatic anemia, blood in stool. Hg drop noted since admission. Appreciate GI team input. Keep on clear liquid diet. CBC in AM.   Faye Ramsay, MD  [facility] Pager [phone]  If 7PM-7AM, please contact night-coverage [email or web address] Password [email or web address]

## 2016-11-10 NOTE — H&P (Signed)
History and Physical    ASHAN CUEVA WCB:[id number] DOB: 1950-11-16 DOA: 11/09/2016  Referring MD/NP/PA:   PCP: Shirline Frees, MD   Patient coming from:  The patient is coming from home.  At baseline, pt is independent for most of ADL.   Chief Complaint: rectal bleeding  HPI: Melvin Dunn is a 66 y.o. male with medical history significant of hyperlipidemia, diet-controlled diabetes, asthma, COPD, CABG, aortic stenosis, prostate cancer, chronic leukocytosis, who presents with rectal bleeding  Patient states that he had colonoscopy 2 weeks ago and had 2 polyp was removed by [facility], Dr. Michail Sermon. He restarted taking ASA 11/06/16. He states that he has had at least 5 episodes of rectal bleeding with bright red blood. He had mild dizziness, which has resolved currently. Patient has nausea, and vomited once. Patient does not have hematemesis or hematuria. Patient has mild dry cough which has been going on for about 10 days. No chest pain or SOB. Denies fever or chills. No symptoms of UTI or unilateral weakness.  ED Course: pt was found to have hemoglobin 12.9 which was 9.1 on 02/18/14, WBC 13.9, positive FOBT, electrolytes renal function okay, temperature normal, blood pressure is soft, no tachycardia, oxygen saturation 97% on room air.  Review of Systems:   General: no fevers, chills, no changes in body weight, has poor appetite, has fatigue HEENT: no blurry vision, hearing changes or sore throat Respiratory: no dyspnea, coughing, wheezing CV: no chest pain, no palpitations GI: has nausea, vomiting, no abdominal pain, diarrhea, constipation. Has rectal bleeding. GU: no dysuria, burning on urination, increased urinary frequency, hematuria  Ext: no leg edema Neuro: no unilateral weakness, numbness, or tingling, no vision change or hearing loss Skin: no rash, no skin tear. MSK: No muscle spasm, no deformity, no limitation of range of movement in spin Heme: No easy bruising.  Travel history:  No recent long distant travel.  Allergy: No Known Allergies  Past Medical History:  Diagnosis Date  . Adenocarcinoma ([place])    of the prostate, 5% of single core 01/2011-watching and waiting for now  . Aortic stenosis 02/11/2014  . Asthma   . Colon polyp   . Coronary artery disease involving native coronary artery with angina pectoris ([place]) 02/12/2014  . Environmental allergies   . Hyperlipemia   . Hyperlipidemia   . Kidney stone   . Left main coronary artery disease 02/12/2014  . Prediabetes   . S/P CABG x 4 02/13/2014   LIMA to LAD, SVG to OM1, SVG to RCA-RPL, EVH via right thigh and leg  . Type II or unspecified type diabetes mellitus without mention of complication, not stated as uncontrolled, borderline     Past Surgical History:  Procedure Laterality Date  . CORONARY ARTERY BYPASS GRAFT N/A 02/13/2014   Procedure: CORONARY ARTERY BYPASS GRAFTING (CABG) TIMES FOUR USING LEFT INTERNAL MAMMARY ARTERY AND RIGHT SAPHENOUS LEG VEIN HARVESTED ENDOSCOPICALLY;  Surgeon: Rexene Alberts, MD;  Location: [place];  Service: Open Heart Surgery;  Laterality: N/A;  . INTRAOPERATIVE TRANSESOPHAGEAL ECHOCARDIOGRAM N/A 02/13/2014   Procedure: INTRAOPERATIVE TRANSESOPHAGEAL ECHOCARDIOGRAM;  Surgeon: Rexene Alberts, MD;  Location: [place];  Service: Open Heart Surgery;  Laterality: N/A;  . LEFT HEART CATHETERIZATION WITH CORONARY ANGIOGRAM N/A 02/12/2014   Procedure: LEFT HEART CATHETERIZATION WITH CORONARY ANGIOGRAM;  Surgeon: Blane Ohara, MD;  Location: [facility] CATH LAB;  Service: Cardiovascular;  Laterality: N/A;    Social History:  reports that he has never smoked. He does not have  any smokeless tobacco history on file. He reports that he does not drink alcohol or use drugs.  Family History:  Family History  Problem Relation Age of Onset  . Alzheimer's disease Mother   . Stroke Mother   . Cancer - Lung Father   . Cancer Father   . Heart attack Maternal Grandfather      Prior to Admission  medications   Medication Sig Start Date End Date Taking? Authorizing Provider  albuterol (PROAIR HFA) 108 (90 BASE) MCG/ACT inhaler Inhale 1-2 puffs into the lungs daily as needed for wheezing or shortness of breath.    [provider]  aspirin EC 325 MG tablet Take 1 tablet (325 mg total) by mouth daily. 02/11/14   Sherren Mocha, MD  atorvastatin (LIPITOR) 80 MG tablet Take 1 tablet (80 mg total) by mouth at bedtime. 07/26/16   Sueanne Margarita, MD  Cinnamon 500 MG capsule Take 1,000 mg by mouth daily. Take 2 Capsule Equal 1000 IU    [provider]  ezetimibe (ZETIA) 10 MG tablet Take 1 tablet (10 mg total) by mouth daily. 07/26/16 10/24/16  Sueanne Margarita, MD  fluticasone (FLONASE) 50 MCG/ACT nasal spray Place 2 sprays into both nostrils at bedtime.    [provider]  metoprolol tartrate (LOPRESSOR) 25 MG tablet Take 1.5 tablets (37.5 mg total) by mouth 2 (two) times daily. 07/26/16   Sueanne Margarita, MD  mometasone-formoterol (DULERA) 200-5 MCG/ACT AERO Inhale 2 puffs into the lungs 2 (two) times daily.    [provider]  Multiple Vitamin (MULTIVITAMIN WITH MINERALS) TABS tablet Take 1 tablet by mouth daily.    [provider]  nitroGLYCERIN (NITROSTAT) 0.4 MG SL tablet Place 1 tablet (0.4 mg total) under the tongue every 5 (five) minutes as needed for chest pain. Patient not taking: Reported on 09/08/2016 01/24/15   Sueanne Margarita, MD  Omega-3 Krill Oil 500 MG CAPS Take 1 capsule by mouth daily.    [provider]    Physical Exam: Vitals:   11/10/16 0015 11/10/16 0030 11/10/16 0045 11/10/16 0100  BP: 102/69 97/63 107/67 111/71  Pulse: 72 72 78 76  Resp:      Temp:      TempSrc:      SpO2: 98% 98% 97% 97%  Weight:      Height:       General: Not in acute distress. Pale looking. HEENT:       Eyes: PERRL, EOMI, no scleral icterus.       ENT: No discharge from the ears and nose, no pharynx injection, no tonsillar enlargement.         Neck: No JVD, no bruit, no mass felt. Heme: No neck lymph node enlargement. Cardiac: O5/D6, RRR, 1/6 systolic murmurs, No gallops or rubs. Respiratory: No rales, wheezing, rhonchi or rubs. GI: Soft, nondistended, nontender, no rebound pain, no organomegaly, BS present. GU: No hematuria Ext: No pitting leg edema bilaterally. 2+DP/PT pulse bilaterally. Musculoskeletal: No joint deformities, No joint redness or warmth, no limitation of ROM in spin. Skin: No rashes.  Neuro: Alert, oriented X3, cranial nerves II-XII grossly intact, moves all extremities normally.  Psych: Patient is not psychotic, no suicidal or hemocidal ideation.  Labs on Admission: I have personally reviewed following labs and imaging studies  CBC:  Recent Labs Lab 11/09/16 2130  WBC 13.9*  HGB 12.9*  HCT 39.4  MCV 87.2  PLT 644   Basic Metabolic Panel:  Recent Labs Lab  11/09/16 2130  NA 140  K 4.2  CL 107  CO2 27  GLUCOSE 127*  BUN 16  CREATININE 0.98  CALCIUM 8.9   GFR: Estimated Creatinine Clearance: 74.9 mL/min (by C-G formula based on SCr of 0.98 mg/dL). Liver Function Tests:  Recent Labs Lab 11/09/16 2130  AST 30  ALT 32  ALKPHOS 70  BILITOT 0.6  PROT 6.0*  ALBUMIN 3.5   No results for input(s): LIPASE, AMYLASE in the last 168 hours. No results for input(s): AMMONIA in the last 168 hours. Coagulation Profile: No results for input(s): INR, PROTIME in the last 168 hours. Cardiac Enzymes: No results for input(s): CKTOTAL, CKMB, CKMBINDEX, TROPONINI in the last 168 hours. BNP (last 3 results) No results for input(s): PROBNP in the last 8760 hours. HbA1C: No results for input(s): HGBA1C in the last 72 hours. CBG: No results for input(s): GLUCAP in the last 168 hours. Lipid Profile: No results for input(s): CHOL, HDL, LDLCALC, TRIG, CHOLHDL, LDLDIRECT in the last 72 hours. Thyroid Function Tests: No results for input(s): TSH, T4TOTAL, FREET4, T3FREE, THYROIDAB in the last 72  hours. Anemia Panel: No results for input(s): VITAMINB12, FOLATE, FERRITIN, TIBC, IRON, RETICCTPCT in the last 72 hours. Urine analysis:    Component Value Date/Time   COLORURINE YELLOW 02/13/2014 0501   APPEARANCEUR CLEAR 02/13/2014 0501   LABSPEC 1.029 02/13/2014 0501   PHURINE 6.0 02/13/2014 0501   GLUCOSEU NEGATIVE 02/13/2014 0501   HGBUR NEGATIVE 02/13/2014 0501   BILIRUBINUR NEGATIVE 02/13/2014 0501   KETONESUR NEGATIVE 02/13/2014 0501   PROTEINUR NEGATIVE 02/13/2014 0501   UROBILINOGEN 1.0 02/13/2014 0501   NITRITE NEGATIVE 02/13/2014 0501   LEUKOCYTESUR NEGATIVE 02/13/2014 0501   Sepsis Labs: @LABRCNTIP (procalcitonin:4,lacticidven:4) )No results found for this or any previous visit (from the past 240 hour(s)).   Radiological Exams on Admission: No results found.   EKG:  Not done in ED, will get one.   Assessment/Plan Principal Problem:   Rectal bleeding Active Problems:   Hyperlipemia   Diabetes mellitus without complication (HCC)   CAD (coronary artery disease)   S/P CABG x 4   Leukocytosis   Rectal bleeding: Patient recently underwent colonoscopy and had 2 polyps removed 2 weeks ago. He is not sure if he had diverticulosis on not. Patient states that he did not have EGD before. Hemoglobin 12.9 on admission. Currently blood pressure is soft, but hemodynamically stable. No chest or SOB. Patient had dizziness, which has resolved.  - will place on tele bed for obs - hold ASA - NPO - IVF: 1L NS and then NS at 100 mL/hr - Start IV pantoprazole 40 mg bib - Zofran IV for nausea - Avoid NSAIDs and SQ heparin - Maintain IV access (2 large bore IVs if possible). - Monitor closely and follow q6h cbc, transfuse as necessary. - LaB: INR, PTT  HLD: -lipitor  CAD: s/p of CABG. No CP. -continue lipitor, Zetia and metoprolol -When necessary nitroglycerin -Hold aspirin due to GI bleeding  Diet controlled DM-II: Last A1c 6.1 on 01/26/14, well controled. Patient is  not taking meds at home. Blood sugar is 127. -monitoring CBG every AM  Leukocytosis: WBC is 13.9. No fever. Pt has mild cough, but no CP or SOB, likely due to viral infection. -f/u by CBC   DVT ppx: SCD Code Status: Full code Family Communication:  Yes, patient's wife and daughter at bed side Disposition Plan:  Anticipate discharge back to previous home environment Consults called:  none Admission status: Obs /  tele     Date of Service 11/10/2016    Ivor Costa [facility] Pager [phone]  If 7PM-7AM, please contact night-coverage [email or web address] Password [email or web address] 11/10/2016, 1:44 AM

## 2016-11-11 DIAGNOSIS — K625 Hemorrhage of anus and rectum: Secondary | ICD-10-CM

## 2016-11-11 LAB — CBC
HEMATOCRIT: 28.8 % — AB (ref 39.0–52.0)
Hemoglobin: 9.5 g/dL — ABNORMAL LOW (ref 13.0–17.0)
MCH: 28.8 pg (ref 26.0–34.0)
MCHC: 33 g/dL (ref 30.0–36.0)
MCV: 87.3 fL (ref 78.0–100.0)
Platelets: 222 10*3/uL (ref 150–400)
RBC: 3.3 MIL/uL — ABNORMAL LOW (ref 4.22–5.81)
RDW: 14 % (ref 11.5–15.5)
WBC: 12.2 10*3/uL — AB (ref 4.0–10.5)

## 2016-11-11 LAB — BASIC METABOLIC PANEL
ANION GAP: 4 — AB (ref 5–15)
BUN: 10 mg/dL (ref 6–20)
CALCIUM: 8.3 mg/dL — AB (ref 8.9–10.3)
CO2: 26 mmol/L (ref 22–32)
CREATININE: 1.02 mg/dL (ref 0.61–1.24)
Chloride: 111 mmol/L (ref 101–111)
Glucose, Bld: 120 mg/dL — ABNORMAL HIGH (ref 65–99)
Potassium: 4.3 mmol/L (ref 3.5–5.1)
SODIUM: 141 mmol/L (ref 135–145)

## 2016-11-11 LAB — GLUCOSE, CAPILLARY: GLUCOSE-CAPILLARY: 128 mg/dL — AB (ref 65–99)

## 2016-11-11 NOTE — Progress Notes (Signed)
[facility] Gastroenterology Progress Note  Melvin Dunn 66 y.o. May 21, 1950  CC:  Rectal bleeding   Subjective: No further bleeding episodes more than 24 hours. Mild drop in hemoglobin could be dilutional. Had brown color stool yesterday. Denied abdominal pain, nausea or vomiting  ROS : Negative for chest pain, shortness of breath. Negative for dizziness.   Objective: Vital signs in last 24 hours: Vitals:   11/11/16 0620 11/11/16 0915  BP: 122/60 130/72  Pulse: 84 88  Resp: 16 18  Temp: 97.9 F (36.6 C) 98.2 F (36.8 C)    Physical Exam:  General:  Alert, cooperative, no distress, appears stated age  Head:  Normocephalic, without obvious abnormality, atraumatic  Eyes:  , EOM's intact,   Lungs:   Clear to auscultation bilaterally, respirations unlabored  Heart:  Regular rate and rhythm, S1, S2 normal  Abdomen:   Soft, non-tender, bowel sounds active all four quadrants,  no masses,   Extremities: Extremities normal, atraumatic, no  edema  Pulses: 2+ and symmetric    Lab Results:  Recent Labs  11/09/16 2130 11/11/16 0448  NA 140 141  K 4.2 4.3  CL 107 111  CO2 27 26  GLUCOSE 127* 120*  BUN 16 10  CREATININE 0.98 1.02  CALCIUM 8.9 8.3*    Recent Labs  11/09/16 2130  AST 30  ALT 32  ALKPHOS 70  BILITOT 0.6  PROT 6.0*  ALBUMIN 3.5    Recent Labs  11/10/16 1300 11/11/16 0448  WBC 12.7* 12.2*  HGB 9.7* 9.5*  HCT 29.9* 28.8*  MCV 87.4 87.3  PLT 227 222    Recent Labs  11/10/16 0451  LABPROT 14.0  INR 1.08      Assessment/Plan: - Post polypectomy bleeding. Resolved - Acute blood loss anemia. Hemoglobin relatively stable compared to yesterday  Recommendations ---------------------------- - No further bleeding episodes in more than 24 hours. - Advance diet as tolerated. - Okay to discharge from GI standpoint. - Patient was advised to call GI clinic or come to the ER if he develops further bleeding episodes. - Iron supplement starting next  week. - Follow-up in GI clinic in 2 weeks. GI will sign off. Call us back if needed   Otis Brace MD, [place] 11/11/2016, 9:28 AM  Pager [phone]  If no answer or after 5 PM call [phone]

## 2016-11-11 NOTE — Discharge Summary (Signed)
Physician Discharge Summary  Melvin Dunn JKD:[id number] DOB: 02/24/1951 DOA: 11/09/2016  PCP: Shirline Frees, MD  Admit date: 11/09/2016 Discharge date: 11/11/2016  Admitted From: Home Disposition: Home  Recommendations for Outpatient Follow-up:  1. Follow up with PCP in 2 weeks 2. Follow up with [facility] in 2 weeks with Dr Michail Sermon 3. Hold off on taking Aspirin for next 5 days and then resume of no further bleeding  4. Advised to return to the ER if notice more bleeding or Notify GI office  Home Health: None Equipment/Devices: None Discharge Condition: Stable CODE STATUS: Full code Diet recommendation: Heart Healthy   Brief/Interim Summary: 66 year old male with past medical history of hyperlipidemia, diabetes type 2, asthma, COPD, CAD status post CABG, aortic stenosis, prostate cancer came to the ER with the complaints of rectal bleeding. Patient had colonoscopy about 2 weeks ago and had 2 polyps removed. He was advised to restart taking aspirin on 11/06/2016. Soon after taking his aspirin he started experiencing rectal bleeding which was bright red. He had about 5 episodes prior to coming to the ER. In the ER he was noted to have stable hemoglobin of 12.9 but he was positive for fecal occult which was checked in the ED. He denied any other complaints such as abdominal pain, fevers and chills. While being in the hospital he has not had anymore rectal bleeding. His hemoglobin has remained stable. He was assessed by gastroenterology and deemed stable therefore no further interventions were performed. Today he has reached maximum benefit from in hospital stay and can follow palpation with his primary care physician and gastroenterology. Follow recommendations as stated above.  This morning patient is tolerating his diet well. No acute overnight events no further complaints at this time.  Discharge Diagnoses:  Principal Problem:   Rectal bleeding Active Problems:   Hyperlipemia  Diabetes mellitus without complication (HCC)   CAD (coronary artery disease)   S/P CABG x 4   Leukocytosis  Painless rectal bleeding, resolved -Recent colonoscopy 2 weeks ago when 2 polyps were removed. -On aspirin at home which was held while he was in the hospital. -Evaluated by gastroenterology. Hemoglobin and vitals remain stable therefore outpatient follow-up in 2 weeks -Advised to avoid using NSAIDs  Hyperlipidemia -Continue Lipitor  Coronary artery disease status post CABG -Stable -Holding aspirin for next 5 days and resume if no further rectal bleeding noted  Diabetes type 2 -Controlled  Discharge Instructions   Allergies as of 11/11/2016   No Known Allergies     Medication List    TAKE these medications   aspirin EC 325 MG tablet Take 1 tablet (325 mg total) by mouth daily.   atorvastatin 80 MG tablet Commonly known as:  LIPITOR Take 1 tablet (80 mg total) by mouth at bedtime.   benzonatate 100 MG capsule Commonly known as:  TESSALON Take 100 mg by mouth 3 (three) times daily as needed for cough.   Cinnamon 500 MG capsule Take 1,000 mg by mouth daily. Take 2 Capsule Equal 1000 IU   DULERA 200-5 MCG/ACT Aero Generic drug:  mometasone-formoterol Inhale 2 puffs into the lungs 2 (two) times daily as needed for wheezing or shortness of breath.   ezetimibe 10 MG tablet Commonly known as:  ZETIA Take 1 tablet (10 mg total) by mouth daily.   fluticasone 50 MCG/ACT nasal spray Commonly known as:  FLONASE Place 2 sprays into both nostrils at bedtime.   metoprolol tartrate 25 MG tablet Commonly known as:  LOPRESSOR Take  1.5 tablets (37.5 mg total) by mouth 2 (two) times daily.   multivitamin with minerals Tabs tablet Take 1 tablet by mouth daily.   nitroGLYCERIN 0.4 MG SL tablet Commonly known as:  NITROSTAT Place 1 tablet (0.4 mg total) under the tongue every 5 (five) minutes as needed for chest pain.   Omega-3 Krill Oil 500 MG Caps Take 1 capsule by  mouth daily.   PROAIR HFA 108 (90 Base) MCG/ACT inhaler Generic drug:  albuterol Inhale 1-2 puffs into the lungs daily as needed for wheezing or shortness of breath.      Follow-up Information    Wilford Corner, MD. Schedule an appointment as soon as possible for a visit in 2 week(s).   Specialty:  Gastroenterology Contact information: [address] [place] [address] [phone]        Shirline Frees, MD. Schedule an appointment as soon as possible for a visit in 2 week(s).   Specialty:  Family Medicine Contact information: [place] [address] [phone]          No Known Allergies  On your next visit with your primary care physician please Get Medicines reviewed and adjusted.   Please request your Prim.MD to go over all Hospital Tests and Procedure/Radiological results at the follow up, please get all Hospital records sent to your Prim MD by signing hospital release before you go home.   If you experience worsening of your admission symptoms, develop shortness of breath, life threatening emergency, suicidal or homicidal thoughts you must seek medical attention immediately by calling 911 or calling your MD immediately  if symptoms less severe.  You Must read complete instructions/literature along with all the possible adverse reactions/side effects for all the Medicines you take and that have been prescribed to you. Take any new Medicines after you have completely understood and accpet all the possible adverse reactions/side effects.   Do not drive, operate heavy machinery, perform activities at heights, swimming or participation in water activities or provide baby sitting services if your were admitted for syncope or siezures until you have seen by Primary MD or a Neurologist and advised to do so again.  Do not drive when taking Pain medications.    Do not take more than prescribed Pain, Sleep and  Anxiety Medications  Special Instructions: If you have smoked or chewed Tobacco  in the last 2 yrs please stop smoking, stop any regular Alcohol  and or any Recreational drug use.  Wear Seat belts while driving.   Please note  You were cared for by a hospitalist during your hospital stay. If you have any questions about your discharge medications or the care you received while you were in the hospital after you are discharged, you can call the unit and asked to speak with the hospitalist on call if the hospitalist that took care of you is not available. Once you are discharged, your primary care physician will handle any further medical issues. Please note that NO REFILLS for any discharge medications will be authorized once you are discharged, as it is imperative that you return to your primary care physician (or establish a relationship with a primary care physician if you do not have one) for your aftercare needs so that they can reassess your need for medications and monitor your lab values.   Increase activity slowly        Consultations:  Gastroenterology   Procedures/Studies:  No results found.  Subjective:   Discharge Exam: Vitals:   11/11/16 0620 11/11/16 0915  BP: 122/60 130/72  Pulse: 84 88  Resp: 16 18  Temp: 97.9 F (36.6 C) 98.2 F (36.8 C)   Vitals:   11/10/16 2318 11/11/16 0620 11/11/16 0915 11/11/16 0931  BP: (!) 133/58 122/60 130/72   Pulse: 80 84 88   Resp: 16 16 18    Temp: 97.6 F (36.4 C) 97.9 F (36.6 C) 98.2 F (36.8 C)   TempSrc: Oral Oral Oral   SpO2: 97% 96% 99% 96%  Weight: 74.8 kg (165 lb)     Height: 5\' 7"  (1.702 m)       General: Pt is alert, awake, not in acute distress Cardiovascular: RRR, S1/S2 +, no rubs, no gallops Respiratory: CTA bilaterally, no wheezing, no rhonchi Abdominal: Soft, NT, ND, bowel sounds + Extremities: no edema, no cyanosis    The results of significant diagnostics from this hospitalization  (including imaging, microbiology, ancillary and laboratory) are listed below for reference.     Microbiology: No results found for this or any previous visit (from the past 240 hour(s)).   Labs: BNP (last 3 results) No results for input(s): BNP in the last 8760 hours. Basic Metabolic Panel:  Recent Labs Lab 11/09/16 2130 11/11/16 0448  NA 140 141  K 4.2 4.3  CL 107 111  CO2 27 26  GLUCOSE 127* 120*  BUN 16 10  CREATININE 0.98 1.02  CALCIUM 8.9 8.3*   Liver Function Tests:  Recent Labs Lab 11/09/16 2130  AST 30  ALT 32  ALKPHOS 70  BILITOT 0.6  PROT 6.0*  ALBUMIN 3.5   No results for input(s): LIPASE, AMYLASE in the last 168 hours. No results for input(s): AMMONIA in the last 168 hours. CBC:  Recent Labs Lab 11/09/16 2130 11/10/16 0451 11/10/16 0753 11/10/16 1300 11/11/16 0448  WBC 13.9* 16.4* 19.5* 12.7* 12.2*  HGB 12.9* 11.0* 11.5* 9.7* 9.5*  HCT 39.4 33.5* 34.6* 29.9* 28.8*  MCV 87.2 87.9 89.4 87.4 87.3  PLT 265 235 275 227 222   Cardiac Enzymes: No results for input(s): CKTOTAL, CKMB, CKMBINDEX, TROPONINI in the last 168 hours. BNP: Invalid input(s): POCBNP CBG:  Recent Labs Lab 11/10/16 0823 11/11/16 0740  GLUCAP 116* 128*   D-Dimer No results for input(s): DDIMER in the last 72 hours. Hgb A1c No results for input(s): HGBA1C in the last 72 hours. Lipid Profile No results for input(s): CHOL, HDL, LDLCALC, TRIG, CHOLHDL, LDLDIRECT in the last 72 hours. Thyroid function studies No results for input(s): TSH, T4TOTAL, T3FREE, THYROIDAB in the last 72 hours.  Invalid input(s): FREET3 Anemia work up No results for input(s): VITAMINB12, FOLATE, FERRITIN, TIBC, IRON, RETICCTPCT in the last 72 hours. Urinalysis    Component Value Date/Time   COLORURINE YELLOW 02/13/2014 0501   APPEARANCEUR CLEAR 02/13/2014 0501   LABSPEC 1.029 02/13/2014 0501   PHURINE 6.0 02/13/2014 0501   GLUCOSEU NEGATIVE 02/13/2014 0501   HGBUR NEGATIVE 02/13/2014  0501   BILIRUBINUR NEGATIVE 02/13/2014 0501   KETONESUR NEGATIVE 02/13/2014 0501   PROTEINUR NEGATIVE 02/13/2014 0501   UROBILINOGEN 1.0 02/13/2014 0501   NITRITE NEGATIVE 02/13/2014 0501   LEUKOCYTESUR NEGATIVE 02/13/2014 0501   Sepsis Labs Invalid input(s): PROCALCITONIN,  WBC,  LACTICIDVEN Microbiology No results found for this or any previous visit (from the past 240 hour(s)).   Time coordinating discharge: Over 30 minutes  SIGNED:   Damita Lack, MD  [facility] 11/11/2016, 11:26 AM Pager   If  7PM-7AM, please contact night-coverage [email or web address] Password [email or web address]

## 2016-12-13 ENCOUNTER — Ambulatory Visit
Admission: RE | Admit: 2016-12-13 | Discharge: 2016-12-13 | Disposition: A | Discharge status: Home / Self Care | Payer: Medicare Other | Source: Ambulatory Visit | Attending: Family Medicine | Admitting: Family Medicine

## 2016-12-13 ENCOUNTER — Other Ambulatory Visit: Payer: Self-pay | Admitting: Family Medicine

## 2016-12-13 DIAGNOSIS — J40 Bronchitis, not specified as acute or chronic: Secondary | ICD-10-CM

## 2017-02-09 ENCOUNTER — Institutional Professional Consult (permissible substitution): Payer: Medicare Other | Admitting: Internal Medicine

## 2017-03-10 ENCOUNTER — Other Ambulatory Visit: Payer: Medicare Other

## 2017-03-17 ENCOUNTER — Ambulatory Visit: Payer: Medicare Other | Admitting: Oncology

## 2017-05-27 ENCOUNTER — Telehealth: Payer: Self-pay

## 2017-05-27 ENCOUNTER — Encounter: Payer: Self-pay | Admitting: Cardiology

## 2017-05-27 ENCOUNTER — Other Ambulatory Visit: Payer: Self-pay

## 2017-05-27 ENCOUNTER — Ambulatory Visit (HOSPITAL_COMMUNITY): Payer: Medicare Other | Attending: Cardiology

## 2017-05-27 DIAGNOSIS — E119 Type 2 diabetes mellitus without complications: Secondary | ICD-10-CM | POA: Insufficient documentation

## 2017-05-27 DIAGNOSIS — I2510 Atherosclerotic heart disease of native coronary artery without angina pectoris: Secondary | ICD-10-CM | POA: Diagnosis not present

## 2017-05-27 DIAGNOSIS — I350 Nonrheumatic aortic (valve) stenosis: Secondary | ICD-10-CM

## 2017-05-27 DIAGNOSIS — I517 Cardiomegaly: Secondary | ICD-10-CM | POA: Insufficient documentation

## 2017-05-27 DIAGNOSIS — E785 Hyperlipidemia, unspecified: Secondary | ICD-10-CM | POA: Diagnosis not present

## 2017-05-27 DIAGNOSIS — I352 Nonrheumatic aortic (valve) stenosis with insufficiency: Secondary | ICD-10-CM | POA: Diagnosis not present

## 2017-05-27 NOTE — Telephone Encounter (Signed)
Notes recorded by Teressa Senter, RN on 05/27/2017 at 5:24 PM EST Patient made aware of results. Patient verbalizes understanding and thanked me for the call. Echo ordered for a year to be scheduled in a year.    Notes recorded by Sueanne Margarita, MD on 05/27/2017 at 4:37 PM EST Echo showed normal LVF with EF 65-70%, with increased stiffness of heart muscle normal for his age, moderate AS, mild AR - echo stable from last year - repeat echo in 1 year for AS

## 2017-06-09 ENCOUNTER — Encounter: Payer: Self-pay | Admitting: Cardiology

## 2017-06-09 DIAGNOSIS — I2510 Atherosclerotic heart disease of native coronary artery without angina pectoris: Secondary | ICD-10-CM | POA: Insufficient documentation

## 2017-06-09 NOTE — Progress Notes (Signed)
Cardiology Office Note:    Date:  06/10/2017   ID:  [name], DOB 09/28/1950, MRN [id number]  PCP:  Shirline Frees, MD  Cardiologist:  No primary care provider on file.    Referring MD: Shirline Frees, MD   Chief Complaint  Patient presents with  . Coronary Artery Disease  . Hyperlipidemia    History of Present Illness:    MALACAI GRANTZ is a 67 y.o. male with a hx of dyslipidemia, prediabetes and ASCAD with cath revealing critical left main stenosis and multivessel ASCAD with normal LVF. He underwent CABG with LIMA to LAD, SVG to OM, SVG to distal RCA/PL.  He is here today for followup and is doing well.  He denies any chest pain or pressure, SOB, DOE, PND, orthopnea, LE edema, dizziness, palpitations or syncope. He is compliant with his meds and is tolerating meds with no SE.      Past Medical History:  Diagnosis Date  . Adenocarcinoma ([place])    of the prostate, 5% of single core 01/2011-watching and waiting for now  . Aortic stenosis 02/11/2014   moderate by echo 05/2017  . Asthma   . CAD (coronary artery disease), native coronary artery    LIMA to LAD, SVG to OM1, SVG to RCA-RPL, EVH via right thigh and leg  . Colon polyp   . Environmental allergies   . Hyperlipidemia   . Kidney stone   . Type II or unspecified type diabetes mellitus without mention of complication, not stated as uncontrolled, borderline     Past Surgical History:  Procedure Laterality Date  . CORONARY ARTERY BYPASS GRAFT N/A 02/13/2014   Procedure: CORONARY ARTERY BYPASS GRAFTING (CABG) TIMES FOUR USING LEFT INTERNAL MAMMARY ARTERY AND RIGHT SAPHENOUS LEG VEIN HARVESTED ENDOSCOPICALLY;  Surgeon: Rexene Alberts, MD;  Location: [place];  Service: Open Heart Surgery;  Laterality: N/A;  . INTRAOPERATIVE TRANSESOPHAGEAL ECHOCARDIOGRAM N/A 02/13/2014   Procedure: INTRAOPERATIVE TRANSESOPHAGEAL ECHOCARDIOGRAM;  Surgeon: Rexene Alberts, MD;  Location: [place];  Service: Open Heart Surgery;  Laterality: N/A;  .  LEFT HEART CATHETERIZATION WITH CORONARY ANGIOGRAM N/A 02/12/2014   Procedure: LEFT HEART CATHETERIZATION WITH CORONARY ANGIOGRAM;  Surgeon: Blane Ohara, MD;  Location: [facility] CATH LAB;  Service: Cardiovascular;  Laterality: N/A;    Current Medications: Current Meds  Medication Sig  . albuterol (PROAIR HFA) 108 (90 BASE) MCG/ACT inhaler Inhale 1-2 puffs into the lungs daily as needed for wheezing or shortness of breath.  Marland Kitchen aspirin EC 325 MG tablet Take 1 tablet (325 mg total) by mouth daily.  Marland Kitchen atorvastatin (LIPITOR) 80 MG tablet Take 1 tablet (80 mg total) by mouth at bedtime.  . benzonatate (TESSALON) 100 MG capsule Take 100 mg by mouth 3 (three) times daily as needed for cough.  . Cinnamon 500 MG capsule Take 1,000 mg by mouth daily. Take 2 Capsule Equal 1000 IU  . fluticasone (FLONASE) 50 MCG/ACT nasal spray Place 2 sprays into both nostrils at bedtime.  Marland Kitchen levocetirizine (XYZAL) 5 MG tablet Take 5 mg by mouth at bedtime.  . metoprolol tartrate (LOPRESSOR) 50 MG tablet Take 50 mg by mouth 2 (two) times daily.  . mometasone-formoterol (DULERA) 200-5 MCG/ACT AERO Inhale 2 puffs into the lungs 2 (two) times daily as needed for wheezing or shortness of breath.   . Multiple Vitamin (MULTIVITAMIN WITH MINERALS) TABS tablet Take 1 tablet by mouth daily.  . nitroGLYCERIN (NITROSTAT) 0.4 MG SL tablet Place 1 tablet (0.4 mg total) under the  tongue every 5 (five) minutes as needed for chest pain.  . Omega-3 Krill Oil 500 MG CAPS Take 1 capsule by mouth daily.     Allergies:   Patient has no known allergies.   Social History   Socioeconomic History  . Marital status: Single    Spouse name: None  . Number of children: None  . Years of education: None  . Highest education level: None  Social Needs  . Financial resource strain: None  . Food insecurity - worry: None  . Food insecurity - inability: None  . Transportation needs - medical: None  . Transportation needs - non-medical: None    Occupational History  . None  Tobacco Use  . Smoking status: Never Smoker  . Smokeless tobacco: Never Used  Substance and Sexual Activity  . Alcohol use: No  . Drug use: No  . Sexual activity: None  Other Topics Concern  . None  Social History Narrative  . None     Family History: The patient's family history includes Alzheimer's disease in his mother; Cancer in his father; Cancer - Lung in his father; Heart attack in his maternal grandfather; Stroke in his mother.  ROS:   Please see the history of present illness.    ROS  All other systems reviewed and negative.   EKGs/Labs/Other Studies Reviewed:    The following studies were reviewed today: none  EKG:  EKG is not ordered today.   Recent Labs: 11/09/2016: ALT 32 11/11/2016: BUN 10; Creatinine, Ser 1.02; Hemoglobin 9.5; Platelets 222; Potassium 4.3; Sodium 141   Recent Lipid Panel    Component Value Date/Time   CHOL 124 05/03/2016 0909   TRIG 92 05/03/2016 0909   HDL 34 (L) 05/03/2016 0909   CHOLHDL 3.6 05/03/2016 0909   VLDL 18 05/03/2016 0909   LDLCALC 72 05/03/2016 0909    Physical Exam:    VS:  BP 122/70   Pulse 66   Ht 5\' 7"  (1.702 m)   Wt 177 lb 9.6 oz (80.6 kg)   SpO2 97%   BMI 27.82 kg/m     Wt Readings from Last 3 Encounters:  06/10/17 177 lb 9.6 oz (80.6 kg)  11/10/16 165 lb (74.8 kg)  09/08/16 173 lb 6.4 oz (78.7 kg)     GEN:  Well nourished, well developed in no acute distress HEENT: Normal NECK: No JVD; No carotid bruits LYMPHATICS: No lymphadenopathy CARDIAC: RRR, no rubs, gallops.  2/6 SM at RUSB to LLSB RESPIRATORY:  Clear to auscultation without rales, wheezing or rhonchi  ABDOMEN: Soft, non-tender, non-distended MUSCULOSKELETAL:  No edema; No deformity  SKIN: Warm and dry NEUROLOGIC:  Alert and oriented x 3 PSYCHIATRIC:  Normal affect   ASSESSMENT:    1. Coronary artery disease involving native coronary artery of native heart without angina pectoris   2. Nonrheumatic  aortic valve stenosis   3. Pure hypercholesterolemia    PLAN:    In order of problems listed above:  1.  ASCAD - cath revealed critical left main stenosis and multivessel ASCAD s/p CABG with LIMA to LAD, SVG to OM, SVG to distal RCA/PL.  He denies any anginal symptoms.  He will continue on ASA, BB and statin.  2.  Hyperlipidemia - LDL goal is < 70.  He will continue on atorvastatin 80mg  daily and Zetia 10mg  daily.  I will get an FLP and ALT.   3.  Aortic stenosis - moderate by echo 05/2017 with mean AV gradient 88mmHg.  He  is asymptomatic.  Repeat echo in 1 year to make sure AS has not worsened.   Medication Adjustments/Labs and Tests Ordered: Current medicines are reviewed at length with the patient today.  Concerns regarding medicines are outlined above.  No orders of the defined types were placed in this encounter.  No orders of the defined types were placed in this encounter.   Signed, Fransico Him, MD  06/10/2017 8:43 AM    [place]

## 2017-06-10 ENCOUNTER — Ambulatory Visit: Payer: Medicare Other | Admitting: Cardiology

## 2017-06-10 ENCOUNTER — Encounter: Payer: Self-pay | Admitting: Cardiology

## 2017-06-10 VITALS — BP 122/70 | HR 66 | Ht 67.0 in | Wt 177.6 lb

## 2017-06-10 DIAGNOSIS — I350 Nonrheumatic aortic (valve) stenosis: Secondary | ICD-10-CM | POA: Diagnosis not present

## 2017-06-10 DIAGNOSIS — E7800 Pure hypercholesterolemia, unspecified: Secondary | ICD-10-CM | POA: Diagnosis not present

## 2017-06-10 DIAGNOSIS — I2510 Atherosclerotic heart disease of native coronary artery without angina pectoris: Secondary | ICD-10-CM

## 2017-06-10 MED ORDER — ASPIRIN EC 81 MG PO TBEC: 81.0000 mg | DELAYED_RELEASE_TABLET | Freq: Every day | ORAL | 3 refills | Status: DC

## 2017-06-10 NOTE — Patient Instructions (Signed)
Medication Instructions:  Your physician has recommended you make the following change in your medication:  DECREASE: apsirin to 81 mg once a day   Labwork: Your physician recommends that you return for lab work in: 1 week for liver function and fasting lipids   Testing/Procedures: Your physician has requested that you have an echocardiogram in 1 year (05/2018) Echocardiography is a painless test that uses sound waves to create images of your heart. It provides your doctor with information about the size and shape of your heart and how well your heart's chambers and valves are working. This procedure takes approximately one hour. There are no restrictions for this procedure.    Follow-Up: Your physician wants you to follow-up in: 1 year with Dr. Radford Pax. You will receive a reminder letter in the mail two months in advance. If you don't receive a letter, please call our office to schedule the follow-up appointment.   Any Other Special Instructions Will Be Listed Below (If Applicable).     If you need a refill on your cardiac medications before your next appointment, please call your pharmacy.

## 2017-06-17 ENCOUNTER — Other Ambulatory Visit: Payer: Medicare Other | Admitting: *Deleted

## 2017-06-17 DIAGNOSIS — I2510 Atherosclerotic heart disease of native coronary artery without angina pectoris: Secondary | ICD-10-CM

## 2017-06-17 DIAGNOSIS — E7800 Pure hypercholesterolemia, unspecified: Secondary | ICD-10-CM

## 2017-06-17 LAB — HEPATIC FUNCTION PANEL
ALT: 22 IU/L (ref 0–44)
AST: 20 IU/L (ref 0–40)
Albumin: 4.1 g/dL (ref 3.6–4.8)
Alkaline Phosphatase: 87 IU/L (ref 39–117)
BILIRUBIN, DIRECT: 0.12 mg/dL (ref 0.00–0.40)
Bilirubin Total: 0.4 mg/dL (ref 0.0–1.2)
Total Protein: 6.1 g/dL (ref 6.0–8.5)

## 2017-06-17 LAB — LIPID PANEL
Chol/HDL Ratio: 3.8 ratio (ref 0.0–5.0)
Cholesterol, Total: 123 mg/dL (ref 100–199)
HDL: 32 mg/dL — ABNORMAL LOW (ref >39)
LDL CALC: 72 mg/dL (ref 0–99)
Triglycerides: 96 mg/dL (ref 0–149)
VLDL CHOLESTEROL CAL: 19 mg/dL (ref 5–40)

## 2017-06-20 ENCOUNTER — Other Ambulatory Visit: Payer: Self-pay | Admitting: Family Medicine

## 2017-06-20 ENCOUNTER — Ambulatory Visit
Admission: RE | Admit: 2017-06-20 | Discharge: 2017-06-20 | Disposition: A | Discharge status: Home / Self Care | Payer: Medicare Other | Source: Ambulatory Visit | Attending: Family Medicine | Admitting: Family Medicine

## 2017-06-20 DIAGNOSIS — R1033 Periumbilical pain: Secondary | ICD-10-CM

## 2017-06-21 ENCOUNTER — Other Ambulatory Visit: Payer: Self-pay | Admitting: Family Medicine

## 2017-06-21 DIAGNOSIS — N200 Calculus of kidney: Secondary | ICD-10-CM

## 2017-06-22 ENCOUNTER — Telehealth: Payer: Self-pay | Admitting: Oncology

## 2017-06-22 NOTE — Telephone Encounter (Signed)
Scheduled appt per 2/6 sch message - Pt is aware of appt date and time.

## 2017-06-24 ENCOUNTER — Other Ambulatory Visit: Payer: Self-pay | Admitting: Family Medicine

## 2017-06-24 DIAGNOSIS — N200 Calculus of kidney: Secondary | ICD-10-CM

## 2017-07-21 ENCOUNTER — Other Ambulatory Visit: Payer: Self-pay | Admitting: Cardiology

## 2017-08-11 ENCOUNTER — Inpatient Hospital Stay: Payer: Medicare Other

## 2017-08-11 ENCOUNTER — Telehealth: Payer: Self-pay

## 2017-08-11 ENCOUNTER — Inpatient Hospital Stay: Payer: Medicare Other | Attending: Oncology | Admitting: Oncology

## 2017-08-11 VITALS — BP 148/74 | HR 63 | Temp 98.7°F | Resp 18 | Ht 67.0 in | Wt 174.2 lb

## 2017-08-11 DIAGNOSIS — Z79899 Other long term (current) drug therapy: Secondary | ICD-10-CM

## 2017-08-11 DIAGNOSIS — R062 Wheezing: Secondary | ICD-10-CM | POA: Diagnosis not present

## 2017-08-11 DIAGNOSIS — D72829 Elevated white blood cell count, unspecified: Secondary | ICD-10-CM

## 2017-08-11 DIAGNOSIS — R0602 Shortness of breath: Secondary | ICD-10-CM

## 2017-08-11 DIAGNOSIS — Z7982 Long term (current) use of aspirin: Secondary | ICD-10-CM | POA: Diagnosis not present

## 2017-08-11 DIAGNOSIS — C61 Malignant neoplasm of prostate: Secondary | ICD-10-CM | POA: Diagnosis not present

## 2017-08-11 LAB — CBC WITH DIFFERENTIAL (CANCER CENTER ONLY)
BASOS ABS: 0.1 10*3/uL (ref 0.0–0.1)
BASOS PCT: 1 %
EOS PCT: 6 %
Eosinophils Absolute: 0.7 10*3/uL — ABNORMAL HIGH (ref 0.0–0.5)
HCT: 42.6 % (ref 38.4–49.9)
Hemoglobin: 14.1 g/dL (ref 13.0–17.1)
Lymphocytes Relative: 19 %
Lymphs Abs: 2.4 10*3/uL (ref 0.9–3.3)
MCH: 27.8 pg (ref 27.2–33.4)
MCHC: 33.1 g/dL (ref 32.0–36.0)
MCV: 84 fL (ref 79.3–98.0)
MONO ABS: 1 10*3/uL — AB (ref 0.1–0.9)
Monocytes Relative: 8 %
NEUTROS ABS: 8.6 10*3/uL — AB (ref 1.5–6.5)
Neutrophils Relative %: 66 %
PLATELETS: 251 10*3/uL (ref 140–400)
RBC: 5.06 MIL/uL (ref 4.20–5.82)
RDW: 14.1 % (ref 11.0–14.6)
WBC: 12.8 10*3/uL — AB (ref 4.0–10.3)

## 2017-08-11 LAB — SAVE SMEAR

## 2017-08-11 NOTE — Telephone Encounter (Signed)
Printed patient avs and lab add on. No return visit at this time. Per 3/28 los

## 2017-08-11 NOTE — Progress Notes (Signed)
Hematology and Oncology Follow Up Visit  Melvin Dunn [id number] 01-25-1951 67 y.o. 08/11/2017 12:57 PM Melvin Dunn, MDHarris, Melvin Saxon, MD   Principle Diagnosis: 67 year old gentleman with leukocytosis diagnosedIn 2015.  He had fluctuating leukocytosis since that time between normal range as high as 20000.  Differential showed neutrophilia.   Current therapy: Active surveillance.  Interim History: [name] presents today for a follow-up visit.  He is a pleasant gentleman I saw in consultation in April 2018 for the evaluation of leukocytosis.  At that time his white cell count was around 13,000 although it was a high as 19,000 in June 2018.  He was scheduled for a follow-up but did not follow-up until today.  His most recent laboratory testing obtained June 20, 2017 showed a white cell count of 22,000, hemoglobin of 14.6, platelet count of 289.  He had 86% neutrophils with some lymphocytes.  No monocytosis, eosinophilia or basophilia.  He reports no recent complaints or symptoms.  He does have periodic wheezing and uses inhaler but no recent hospitalization or illnesses.  He denies any recent infections or fevers.  He does not take any prednisone.  He denies any lymphadenopathy or petechiae.  He denies any constitutional symptoms.   He does not report any headaches, blurry vision, syncope or seizures. Does not report any fevers, chills or sweats.  Does not report any cough, wheezing or hemoptysis.  Does not report any chest pain, palpitation, orthopnea or leg edema.  Does not report any nausea, vomiting or abdominal pain.  Does not report any constipation or diarrhea.  Does not report any skeletal complaints.    Does not report frequency, urgency or hematuria.  Does not report any skin rashes or lesions. Does not report any heat or cold intolerance.  Does not report any anxiety or depression.  Remaining review of systems is negative.    Medications: I have reviewed the patient's current  medications.  Current Outpatient Medications  Medication Sig Dispense Refill  . albuterol (PROAIR HFA) 108 (90 BASE) MCG/ACT inhaler Inhale 1-2 puffs into the lungs daily as needed for wheezing or shortness of breath.    Marland Kitchen aspirin EC 81 MG tablet Take 1 tablet (81 mg total) by mouth daily. 90 tablet 3  . atorvastatin (LIPITOR) 80 MG tablet TAKE 1 TABLET BY MOUTH ONCE DAILY AT BEDTIME 90 tablet 2  . benzonatate (TESSALON) 100 MG capsule Take 100 mg by mouth 3 (three) times daily as needed for cough.    . Cinnamon 500 MG capsule Take 1,000 mg by mouth daily. Take 2 Capsule Equal 1000 IU    . ezetimibe (ZETIA) 10 MG tablet Take 1 tablet (10 mg total) by mouth daily. 90 tablet 2  . fluticasone (FLONASE) 50 MCG/ACT nasal spray Place 2 sprays into both nostrils at bedtime.    Marland Kitchen levocetirizine (XYZAL) 5 MG tablet Take 5 mg by mouth at bedtime.    . metoprolol tartrate (LOPRESSOR) 50 MG tablet Take 50 mg by mouth 2 (two) times daily.    . mometasone-formoterol (DULERA) 200-5 MCG/ACT AERO Inhale 2 puffs into the lungs 2 (two) times daily as needed for wheezing or shortness of breath.     . Multiple Vitamin (MULTIVITAMIN WITH MINERALS) TABS tablet Take 1 tablet by mouth daily.    . nitroGLYCERIN (NITROSTAT) 0.4 MG SL tablet Place 1 tablet (0.4 mg total) under the tongue every 5 (five) minutes as needed for chest pain. 25 tablet 3  . Omega-3 Krill Oil 500 MG CAPS  Take 1 capsule by mouth daily.     No current facility-administered medications for this visit.      Allergies: No Known Allergies  Past Medical History, Surgical history, Social history, and Family History were reviewed and updated.    Physical Exam: Blood pressure (!) 148/74, pulse 63, temperature 98.7 F (37.1 C), temperature source Oral, resp. rate 18, height 5\' 7"  (1.702 m), weight 174 lb 3.2 oz (79 kg), SpO2 97 %.   ECOG:  General appearance: alert and cooperative appeared without distress. Head: Normocephalic, without obvious  abnormality Oropharynx: No oral thrush or ulcers. Eyes: No scleral icterus.  Pupils are equal and round reactive to light. Lymph nodes: Cervical, supraclavicular, and axillary nodes normal. Heart:regular rate and rhythm, systolic ejection murmur auscultated. Lung:chest clear, no wheezing, rales, normal symmetric air entry Abdomin: soft, non-tender, without masses or organomegaly. Neurological: No motor, sensory deficits.  Intact deep tendon reflexes. Skin: No rashes or lesions.  No ecchymosis or petechiae. Musculoskeletal: No joint deformity or effusion. Psychiatric: Mood and affect are appropriate.    Lab Results: Lab Results  Component Value Date   WBC 12.2 (H) 11/11/2016   HGB 9.5 (L) 11/11/2016   HCT 28.8 (L) 11/11/2016   MCV 87.3 11/11/2016   PLT 222 11/11/2016     Chemistry      Component Value Date/Time   NA 141 11/11/2016 0448   K 4.3 11/11/2016 0448   CL 111 11/11/2016 0448   CO2 26 11/11/2016 0448   BUN 10 11/11/2016 0448   CREATININE 1.02 11/11/2016 0448      Component Value Date/Time   CALCIUM 8.3 (L) 11/11/2016 0448   ALKPHOS 87 06/17/2017 0754   AST 20 06/17/2017 0754   ALT 22 06/17/2017 0754   BILITOT 0.4 06/17/2017 0754       Impression and Plan:   67 year old gentleman with the following:  1.  Leukocytosis: Fluctuating since 2015 with unclear etiology.  The differential diagnosis was reviewed again which include reactive leukocytosis related to asthma, allergy or other causes.  Myeloproliferative disorder such as chronic myelogenous leukemia need to be ruled out.    We will obtain BCR ABL  testing today to rule out this condition.  I will repeat his CBC as well and we will communicate these results to him once that is available.  If his white cell count has declined and CML is ruled out, no further workup is needed at this time as his leukocytosis is likely reactive in nature.  2.  Age-appropriate cancer screening: Recommended to continue doing  so and he is up-to-date.  3.  Prostate cancer: Low-grade and followed by [facility].  4.  Follow-up: To be determined depending on his laboratory testing.  15  minutes was spent with the patient face-to-face today.  More than 50% of time was dedicated to patient counseling, education and coordination of his care.    Zola Button, MD 3/28/201912:57 PM

## 2017-08-22 LAB — BCR ABL1 FISH (GENPATH)

## 2017-08-26 ENCOUNTER — Other Ambulatory Visit: Payer: Self-pay | Admitting: Cardiology

## 2017-11-09 ENCOUNTER — Other Ambulatory Visit: Payer: Self-pay | Admitting: Urology

## 2017-11-09 DIAGNOSIS — C61 Malignant neoplasm of prostate: Secondary | ICD-10-CM

## 2017-11-25 ENCOUNTER — Ambulatory Visit
Admission: RE | Admit: 2017-11-25 | Discharge: 2017-11-25 | Disposition: A | Discharge status: Home / Self Care | Payer: Medicare Other | Source: Ambulatory Visit | Attending: Urology | Admitting: Urology

## 2017-11-25 DIAGNOSIS — C61 Malignant neoplasm of prostate: Secondary | ICD-10-CM

## 2017-11-25 MED ORDER — GADOBENATE DIMEGLUMINE 529 MG/ML IV SOLN
16.0000 mL | Freq: Once | INTRAVENOUS | Status: AC | PRN
  Administered 2017-11-25: 16 mL via INTRAVENOUS

## 2018-04-12 ENCOUNTER — Other Ambulatory Visit: Payer: Self-pay | Admitting: Cardiology

## 2018-05-23 ENCOUNTER — Other Ambulatory Visit: Payer: Self-pay | Admitting: Cardiology

## 2018-06-12 ENCOUNTER — Ambulatory Visit (HOSPITAL_COMMUNITY): Payer: Medicare Other | Attending: Internal Medicine

## 2018-06-12 ENCOUNTER — Other Ambulatory Visit: Payer: Self-pay

## 2018-06-12 DIAGNOSIS — I350 Nonrheumatic aortic (valve) stenosis: Secondary | ICD-10-CM

## 2018-06-13 ENCOUNTER — Telehealth: Payer: Self-pay | Admitting: *Deleted

## 2018-06-13 DIAGNOSIS — I350 Nonrheumatic aortic (valve) stenosis: Secondary | ICD-10-CM

## 2018-06-13 NOTE — Telephone Encounter (Signed)
-----   Message from Sueanne Margarita, MD sent at 06/12/2018  9:36 PM EST ----- Echo showed normal LVF with increased stiffness of heart muscle , moderate to severe AS (likely more in the moderate side), mid AR.  Please repeat echo in 6 months to reassess Aortic vavle

## 2018-06-13 NOTE — Telephone Encounter (Signed)
I s/w pt and went over echo results. I advised of recommendation per Dr. Radford Pax to repeat echo in 6 months. This has been scheduled today to be done 12/12/18 @ 4 pm. I will send in for pre cert. Pt is agreeable to plan of care and thanked me for the call.

## 2018-06-19 ENCOUNTER — Other Ambulatory Visit: Payer: Self-pay | Admitting: Cardiology

## 2018-07-05 NOTE — Progress Notes (Signed)
Cardiology Office Note:    Date:  07/06/2018   ID:  Melvin Dunn, DOB 1950-10-09, MRN [id number]  PCP:  Shirline Frees, MD  Cardiologist:  No primary care provider on file.    Referring MD: Shirline Frees, MD   Chief Complaint  Patient presents with  . Coronary Artery Disease  . Aortic Stenosis  . Hyperlipidemia    History of Present Illness:    Melvin Dunn is a 68 y.o. male with a hx of dyslipidemia, prediabetes and ASCAD with cath revealing critical left main stenosis and multivessel ASCAD with normal LVF. He underwent CABG with LIMA to LAD, SVG to OM, SVG to distal RCA/PL.  He is here today for followup and is doing well.  He denies any chest pain or pressure, SOB, DOE, PND, orthopnea, LE edema, dizziness, palpitations or syncope. He is compliant with his meds and is tolerating meds with no SE.    Past Medical History:  Diagnosis Date  . Adenocarcinoma ([place])    of the prostate, 5% of single core 01/2011-watching and waiting for now  . Aortic stenosis 02/11/2014   moderate by echo 05/2017  . Asthma   . CAD (coronary artery disease), native coronary artery    LIMA to LAD, SVG to OM1, SVG to RCA-RPL, EVH via right thigh and leg  . Colon polyp   . Environmental allergies   . Hyperlipidemia   . Kidney stone   . Type II or unspecified type diabetes mellitus without mention of complication, not stated as uncontrolled, borderline     Past Surgical History:  Procedure Laterality Date  . CORONARY ARTERY BYPASS GRAFT N/A 02/13/2014   Procedure: CORONARY ARTERY BYPASS GRAFTING (CABG) TIMES FOUR USING LEFT INTERNAL MAMMARY ARTERY AND RIGHT SAPHENOUS LEG VEIN HARVESTED ENDOSCOPICALLY;  Surgeon: Rexene Alberts, MD;  Location: [place];  Service: Open Heart Surgery;  Laterality: N/A;  . INTRAOPERATIVE TRANSESOPHAGEAL ECHOCARDIOGRAM N/A 02/13/2014   Procedure: INTRAOPERATIVE TRANSESOPHAGEAL ECHOCARDIOGRAM;  Surgeon: Rexene Alberts, MD;  Location: [place];  Service: Open Heart Surgery;   Laterality: N/A;  . LEFT HEART CATHETERIZATION WITH CORONARY ANGIOGRAM N/A 02/12/2014   Procedure: LEFT HEART CATHETERIZATION WITH CORONARY ANGIOGRAM;  Surgeon: Blane Ohara, MD;  Location: [facility] CATH LAB;  Service: Cardiovascular;  Laterality: N/A;    Current Medications: Current Meds  Medication Sig  . albuterol (PROAIR HFA) 108 (90 BASE) MCG/ACT inhaler Inhale 1-2 puffs into the lungs daily as needed for wheezing or shortness of breath.  Marland Kitchen aspirin EC 81 MG tablet Take 1 tablet (81 mg total) by mouth daily.  Marland Kitchen atorvastatin (LIPITOR) 80 MG tablet Take 1 tablet (80 mg total) by mouth at bedtime.  . benzonatate (TESSALON) 100 MG capsule Take 100 mg by mouth 3 (three) times daily as needed for cough.  . Cinnamon 500 MG capsule Take 1,000 mg by mouth daily. Take 2 Capsule Equal 1000 IU  . ezetimibe (ZETIA) 10 MG tablet Take 1 tablet (10 mg total) by mouth daily. Patient needs to call and schedule an appointment for further refills 2nd attempt  . fluticasone (FLONASE) 50 MCG/ACT nasal spray Place 2 sprays into both nostrils at bedtime.  Marland Kitchen levocetirizine (XYZAL) 5 MG tablet Take 5 mg by mouth at bedtime.  . metoprolol tartrate (LOPRESSOR) 50 MG tablet Take 50 mg by mouth 2 (two) times daily.  . mometasone-formoterol (DULERA) 200-5 MCG/ACT AERO Inhale 2 puffs into the lungs 2 (two) times daily as needed for wheezing or shortness of breath.   Marland Kitchen  Multiple Vitamin (MULTIVITAMIN WITH MINERALS) TABS tablet Take 1 tablet by mouth daily.  . nitroGLYCERIN (NITROSTAT) 0.4 MG SL tablet Place 1 tablet (0.4 mg total) under the tongue every 5 (five) minutes as needed for chest pain.  . Omega-3 Krill Oil 500 MG CAPS Take 1 capsule by mouth daily.     Allergies:   Patient has no known allergies.   Social History   Socioeconomic History  . Marital status: Single    Spouse name: Not on file  . Number of children: Not on file  . Years of education: Not on file  . Highest education level: Not on file    Occupational History  . Not on file  Social Needs  . Financial resource strain: Not on file  . Food insecurity:    Worry: Not on file    Inability: Not on file  . Transportation needs:    Medical: Not on file    Non-medical: Not on file  Tobacco Use  . Smoking status: Never Smoker  . Smokeless tobacco: Never Used  Substance and Sexual Activity  . Alcohol use: No  . Drug use: No  . Sexual activity: Not on file  Lifestyle  . Physical activity:    Days per week: Not on file    Minutes per session: Not on file  . Stress: Not on file  Relationships  . Social connections:    Talks on phone: Not on file    Gets together: Not on file    Attends religious service: Not on file    Active member of club or organization: Not on file    Attends meetings of clubs or organizations: Not on file    Relationship status: Not on file  Other Topics Concern  . Not on file  Social History Narrative  . Not on file     Family History: The patient's family history includes Alzheimer's disease in his mother; Cancer in his father; Cancer - Lung in his father; Heart attack in his maternal grandfather; Stroke in his mother.  ROS:   Please see the history of present illness.    ROS  All other systems reviewed and negative.   EKGs/Labs/Other Studies Reviewed:    The following studies were reviewed today: none  EKG:  EKG is  ordered today.  The ekg ordered today demonstrates normal sinus rhythm at 69 bpm with nonspecific ST-T wave abnormality.  Recent Labs: 08/11/2017: Hemoglobin 14.1; Platelet Count 251   Recent Lipid Panel    Component Value Date/Time   CHOL 123 06/17/2017 0754   TRIG 96 06/17/2017 0754   HDL 32 (L) 06/17/2017 0754   CHOLHDL 3.8 06/17/2017 0754   CHOLHDL 3.6 05/03/2016 0909   VLDL 18 05/03/2016 0909   LDLCALC 72 06/17/2017 0754    Physical Exam:    VS:  BP 138/72   Pulse (!) 59   Ht 5\' 7"  (1.702 m)   Wt 172 lb 9.6 oz (78.3 kg)   SpO2 94%   BMI 27.03 kg/m      Wt Readings from Last 3 Encounters:  07/06/18 172 lb 9.6 oz (78.3 kg)  08/11/17 174 lb 3.2 oz (79 kg)  06/10/17 177 lb 9.6 oz (80.6 kg)     GEN:  Well nourished, well developed in no acute distress HEENT: Normal NECK: No JVD; No carotid bruits LYMPHATICS: No lymphadenopathy CARDIAC: RRR, no  rubs, gallops.  2/6 late peaking systolic murmur at the right upper sternal border rating to left  lower sternal border.  There is no audible A2 component at the second heart sound. RESPIRATORY:  Clear to auscultation without rales, wheezing or rhonchi  ABDOMEN: Soft, non-tender, non-distended MUSCULOSKELETAL:  No edema; No deformity  SKIN: Warm and dry NEUROLOGIC:  Alert and oriented x 3 PSYCHIATRIC:  Normal affect   ASSESSMENT:    1. Coronary artery disease involving native coronary artery of native heart without angina pectoris   2. Nonrheumatic aortic valve stenosis   3. Pure hypercholesterolemia    PLAN:    In order of problems listed above:  1.  ASCAD -  Cath showed critical left main stenosis and multivessel ASCAD s/p  CABG with LIMA to LAD, SVG to OM, SVG to distal RCA/PL.  He denies any anginal sx.  He will continue on Lopressor 50mg  BID, atorvastatin 80mg  daily, ASA 81mg  daily.  2.  Aortic stenosis - moderate to severe AS and mild AR by echo 05/2018.  Mean AVG 68mmHg, AVA 0.83cm2 and dimensionless index 0.24.  He is asymptomatic and LV systolic function and LV size remain in the normal range.  Therefore we will repeat 2D echocardiogram in 6 months to make sure no changes have occurred.  I am then to get an exercise treadmill test on the modified Bruce protocol to determine if he is actually having symptoms that he is unaware of from his left ear.  I have instructed him to call if he develops chest pain, shortness of breath, PND, orthopnea, lower extremity edema or syncope.  3.  Hyperlipidemia - LDL at goal at 72 a year ago.  Repeat FLP and ALT.Marland Kitchen  Continue on atorvastatin 80mg  daily and  Zetia 10mg  daily.     Medication Adjustments/Labs and Tests Ordered: Current medicines are reviewed at length with the patient today.  Concerns regarding medicines are outlined above.  No orders of the defined types were placed in this encounter.  No orders of the defined types were placed in this encounter.   Signed, Fransico Him, MD  07/06/2018 1:01 PM    [place]

## 2018-07-06 ENCOUNTER — Encounter: Payer: Self-pay | Admitting: Cardiology

## 2018-07-06 ENCOUNTER — Ambulatory Visit: Payer: Medicare Other | Admitting: Cardiology

## 2018-07-06 VITALS — BP 138/72 | HR 59 | Ht 67.0 in | Wt 172.6 lb

## 2018-07-06 DIAGNOSIS — I2510 Atherosclerotic heart disease of native coronary artery without angina pectoris: Secondary | ICD-10-CM | POA: Diagnosis not present

## 2018-07-06 DIAGNOSIS — E7800 Pure hypercholesterolemia, unspecified: Secondary | ICD-10-CM | POA: Diagnosis not present

## 2018-07-06 DIAGNOSIS — I350 Nonrheumatic aortic (valve) stenosis: Secondary | ICD-10-CM

## 2018-07-06 NOTE — Patient Instructions (Addendum)
Medication Instructions:  Your physician recommends that you continue on your current medications as directed. Please refer to the Current Medication list given to you today.  If you need a refill on your cardiac medications before your next appointment, please call your pharmacy.   Lab work: Future:   If you have labs (blood work) drawn today and your tests are completely normal, you will receive your results only by: Marland Kitchen MyChart Message (if you have MyChart) OR . A paper copy in the mail If you have any lab test that is abnormal or we need to change your treatment, we will call you to review the results.  Testing/Procedures: Keep your schedule echo appointment  Your physician has requested that you have an modified Bruce exercise tolerance test. For further information please visit [email or web address]. Please also follow instruction sheet, as given.  Follow-Up: At [facility], you and your health needs are our priority.  As part of our continuing mission to provide you with exceptional heart care, we have created designated Provider Care Teams.  These Care Teams include your primary Cardiologist (physician) and Advanced Practice Providers (APPs -  Physician Assistants and Nurse Practitioners) who all work together to provide you with the care you need, when you need it.  Your physician wants you to follow-up in: 6 months with the PA. You will receive a reminder letter in the mail two months in advance. If you don't receive a letter, please call our office to schedule the follow-up appointment.  You will need a follow up appointment in 1 years.  Please call our office 2 months in advance to schedule this appointment.  You may see Dr. Radford Pax or one of the following Advanced Practice Providers on your designated Care Team:   [place], PA-C Melina Copa, PA-C . Ermalinda Barrios, PA-C

## 2018-07-06 NOTE — Addendum Note (Signed)
Addended by: Mendel Ryder on: 07/06/2018 03:20 PM   Modules accepted: Orders

## 2018-07-10 ENCOUNTER — Other Ambulatory Visit: Payer: Self-pay | Admitting: Cardiology

## 2018-07-10 ENCOUNTER — Ambulatory Visit (INDEPENDENT_AMBULATORY_CARE_PROVIDER_SITE_OTHER): Payer: Medicare Other

## 2018-07-10 DIAGNOSIS — I2510 Atherosclerotic heart disease of native coronary artery without angina pectoris: Secondary | ICD-10-CM

## 2018-07-10 DIAGNOSIS — I350 Nonrheumatic aortic (valve) stenosis: Secondary | ICD-10-CM

## 2018-07-10 LAB — EXERCISE TOLERANCE TEST
CHL CUP RESTING HR STRESS: 68 {beats}/min
CSEPEW: 10.1 METS
Exercise duration (min): 13 min
Exercise duration (sec): 36 s
MPHR: 152 {beats}/min
Peak HR: 150 {beats}/min
Percent HR: 99 %
RPE: 17

## 2018-07-11 ENCOUNTER — Other Ambulatory Visit: Payer: Self-pay | Admitting: Cardiology

## 2018-07-12 ENCOUNTER — Telehealth: Payer: Self-pay

## 2018-07-12 ENCOUNTER — Other Ambulatory Visit: Payer: Self-pay | Admitting: Cardiology

## 2018-07-12 DIAGNOSIS — I350 Nonrheumatic aortic (valve) stenosis: Secondary | ICD-10-CM

## 2018-07-12 NOTE — Telephone Encounter (Signed)
-----   Message from Sueanne Margarita, MD sent at 07/10/2018  7:41 PM EST ----- No drop in BP with exercise and EKG nondiagnostic due to baseline EKG changes likely accentuated with Hypertensive BP response to exercise.  This study was done to assess for sx related to severe AS - patient had excellent exercise capacity and therefore AS non -limiting at this time.  He needs to let us know if he develops CP, SOB, LE edema, syncope.  Otherwise repeat echo in 6 months

## 2018-07-12 NOTE — Telephone Encounter (Signed)
Spoke with the patient, he expressed understanding about his results. Repeat GXT order placed for 6 months. He stated he will contact us if he feels symptomatic.

## 2018-07-13 ENCOUNTER — Other Ambulatory Visit: Payer: Medicare Other | Admitting: *Deleted

## 2018-07-13 DIAGNOSIS — E7800 Pure hypercholesterolemia, unspecified: Secondary | ICD-10-CM

## 2018-07-14 LAB — LIPID PANEL
Chol/HDL Ratio: 3.9 ratio (ref 0.0–5.0)
Cholesterol, Total: 136 mg/dL (ref 100–199)
HDL: 35 mg/dL — ABNORMAL LOW (ref >39)
LDL Calculated: 74 mg/dL (ref 0–99)
Triglycerides: 137 mg/dL (ref 0–149)
VLDL Cholesterol Cal: 27 mg/dL (ref 5–40)

## 2018-07-14 LAB — HEPATIC FUNCTION PANEL
ALK PHOS: 79 IU/L (ref 39–117)
ALT: 25 IU/L (ref 0–44)
AST: 21 IU/L (ref 0–40)
Albumin: 4.5 g/dL (ref 3.8–4.8)
BILIRUBIN TOTAL: 0.5 mg/dL (ref 0.0–1.2)
BILIRUBIN, DIRECT: 0.13 mg/dL (ref 0.00–0.40)
Total Protein: 6.5 g/dL (ref 6.0–8.5)

## 2018-12-12 ENCOUNTER — Other Ambulatory Visit: Payer: Self-pay

## 2018-12-12 ENCOUNTER — Ambulatory Visit (HOSPITAL_COMMUNITY): Payer: Medicare Other | Attending: Internal Medicine

## 2018-12-12 DIAGNOSIS — I350 Nonrheumatic aortic (valve) stenosis: Secondary | ICD-10-CM

## 2018-12-13 ENCOUNTER — Telehealth: Payer: Self-pay

## 2018-12-13 DIAGNOSIS — I351 Nonrheumatic aortic (valve) insufficiency: Secondary | ICD-10-CM

## 2018-12-13 DIAGNOSIS — I350 Nonrheumatic aortic (valve) stenosis: Secondary | ICD-10-CM

## 2018-12-13 NOTE — Telephone Encounter (Signed)
Notes recorded by Frederik Schmidt, RN on 12/13/2018 at 8:56 AM EDT  The patient has been notified of the result and verbalized understanding. All questions (if any) were answered.  Frederik Schmidt, RN 12/13/2018 8:56 AM   ------  Patient is asymptomatic, except for being tired a lot.  He will keep appointment for f/u with Brittainy in August.

## 2018-12-13 NOTE — Telephone Encounter (Signed)
-----   Message from Sueanne Margarita, MD sent at 12/12/2018  8:19 PM EDT ----- Echo showed normal LVF with moderate to severe AS and mild to moderate AI.  Compared to echo in January  AV has not changed significantly.  Please find out if he has had any CP or discomfort, SOB, LE edema, dizziness, exercise intolerance, reduced exercise capacity or syncope.  If asymptomatic then repeat 2D echo in 6 months

## 2018-12-20 ENCOUNTER — Ambulatory Visit (INDEPENDENT_AMBULATORY_CARE_PROVIDER_SITE_OTHER): Payer: Medicare Other

## 2018-12-20 ENCOUNTER — Ambulatory Visit: Payer: Medicare Other | Admitting: Podiatry

## 2018-12-20 ENCOUNTER — Encounter: Payer: Self-pay | Admitting: Podiatry

## 2018-12-20 ENCOUNTER — Other Ambulatory Visit: Payer: Self-pay

## 2018-12-20 DIAGNOSIS — M216X2 Other acquired deformities of left foot: Secondary | ICD-10-CM | POA: Diagnosis not present

## 2018-12-20 DIAGNOSIS — M722 Plantar fascial fibromatosis: Secondary | ICD-10-CM

## 2018-12-20 DIAGNOSIS — Q828 Other specified congenital malformations of skin: Secondary | ICD-10-CM | POA: Diagnosis not present

## 2018-12-20 MED ORDER — MELOXICAM 7.5 MG PO TABS: 7.5000 mg | ORAL_TABLET | Freq: Every day | ORAL | 0 refills | Status: DC

## 2018-12-20 NOTE — Progress Notes (Addendum)
This patient present to the office  with chief complaint of callus developing under the outside of ball of left  feet.  He  says this callus has become painful walking and wearing her shoes.  Patient presents to the office for the same problem in 2017. Patient has provided no  treatment or sought professional help.  He  presents to the office for treatment of his painful callus.  Patient says he has developed pain in his right heel for the last 3 weeks.  He says he has heel pain after activity.    Vascular  Dorsalis pedis and posterior tibial pulses are palpable  B/L.  Capillary return  WNL.  Temperature gradient is  WNL.  Skin turgor  WNL  Sensorium  Senn Weinstein monofilament wire  WNL. Normal tactile sensation.  Nail Exam  Patient has normal nails with no evidence of bacterial or fungal infection.  Orthopedic  Exam  Muscle tone and muscle strength  WNL.  No limitations of motion feet  B/L.  No crepitus or joint effusion noted.  Foot type is unremarkable and digits show no abnormalities.  Bony prominences are unremarkable.  Plantar flexed fifth metatarsal  B/L.  Hallux limitus 1st MPJ right foot.  Dorsal exostosis first metatarsal right foot.  Mild palpable pain in right heel.  Skin  No open lesions.  Normal skin texture and turgor.  Callus/porokeratosis  Sub 4th left.  Porokeratosis secondary plantar flexed fifth metatarsal  B/L  IE  Debride callus/porokeratosis.  X-ray taken revealing no calcification at insertion plantar fascia right heel.  Discussed condition with patient.  Powerstep insoles were prescribed.  Prescribe Mobic  7.5 mg. RTC 3 weeks.  Padding was applied to powerstep insole sub 4th left.  Gardiner Barefoot DPM

## 2019-01-03 ENCOUNTER — Encounter: Payer: Self-pay | Admitting: Cardiology

## 2019-01-03 ENCOUNTER — Telehealth (INDEPENDENT_AMBULATORY_CARE_PROVIDER_SITE_OTHER): Payer: Medicare Other | Admitting: Cardiology

## 2019-01-03 ENCOUNTER — Other Ambulatory Visit: Payer: Self-pay

## 2019-01-03 ENCOUNTER — Telehealth: Payer: Self-pay

## 2019-01-03 VITALS — Ht 67.0 in | Wt 160.0 lb

## 2019-01-03 DIAGNOSIS — Z951 Presence of aortocoronary bypass graft: Secondary | ICD-10-CM

## 2019-01-03 DIAGNOSIS — E785 Hyperlipidemia, unspecified: Secondary | ICD-10-CM

## 2019-01-03 DIAGNOSIS — I2510 Atherosclerotic heart disease of native coronary artery without angina pectoris: Secondary | ICD-10-CM | POA: Diagnosis not present

## 2019-01-03 DIAGNOSIS — I350 Nonrheumatic aortic (valve) stenosis: Secondary | ICD-10-CM

## 2019-01-03 NOTE — Patient Instructions (Signed)
Medication Instructions:  none If you need a refill on your cardiac medications before your next appointment, please call your pharmacy.   Lab work: 8/20 HFT FLP If you have labs (blood work) drawn today and your tests are completely normal, you will receive your results only by: Marland Kitchen MyChart Message (if you have MyChart) OR . A paper copy in the mail If you have any lab test that is abnormal or we need to change your treatment, we will call you to review the results.  Testing/Procedures: Please call [phone] to schedule Echo in January (ordered)  Follow-Up: 6 months with Dr Radford Pax At [facility], you and your health needs are our priority.  As part of our continuing mission to provide you with exceptional heart care, we have created designated Provider Care Teams.  These Care Teams include your primary Cardiologist (physician) and Advanced Practice Providers (APPs -  Physician Assistants and Nurse Practitioners) who all work together to provide you with the care you need, when you need it.  Any Other Special Instructions Will Be Listed Below (If Applicable).

## 2019-01-03 NOTE — Telephone Encounter (Signed)
Spoke to pt and he gave me consent to do a video conference with provider  /jb

## 2019-01-03 NOTE — Progress Notes (Signed)
Virtual Visit via Telephone Note   This visit type was conducted due to national recommendations for restrictions regarding the COVID-19 Pandemic (e.g. social distancing) in an effort to limit this patient's exposure and mitigate transmission in our community.  Due to his co-morbid illnesses, this patient is at least at moderate risk for complications without adequate follow up.  This format is felt to be most appropriate for this patient at this time.  The patient did not have access to video technology/had technical difficulties with video requiring transitioning to audio format only (telephone).  All issues noted in this document were discussed and addressed.  No physical exam could be performed with this format.  Please refer to the patient's chart for his  consent to telehealth for [facility].   Date:  01/03/2019   ID:  [name], DOB [birth year], MRN [id number]  Patient Location: Home Provider Location: Home  PCP:  Shirline Frees, MD  Cardiologist:  Dr. Radford Pax Electrophysiologist:  None   Evaluation Performed:  Follow-Up Visit  Chief Complaint:  6 Month F/u for CAD and Aortic Stenosis   History of Present Illness:    Melvin Dunn is a 68 y.o. male with a hx of dyslipidemia, prediabetes and ASCAD with cath revealing critical left main stenosis and multivessel ASCAD with normal LVF. He underwent CABG with LIMA to LAD, SVG to OM, SVG to distal RCA/PL in 2015. He also has aortic stenosis. This is being followed closely. He had recent echocardiogram done in July. Echo showed normal LVF with moderate to severe AS and mild to moderate AI. Compared to prior echo in January, AV has not changed significantly. Dr. Radford Pax recommended repeat study in 6 months.   Today, pt reports that he has done well.  He denies any anginal symptomatology.  No exertional chest pain or dyspnea.  He denies dizziness, lightheadedness, syncope/near syncope.  No lower extremity edema, orthopnea or PND.  He  reports full medication compliance.  He is tolerating medications well.  Unfortunately he does not have a home blood pressure cuff available to check vital signs this morning.  The patient does not have symptoms concerning for COVID-19 infection (fever, chills, cough, or new shortness of breath).    Past Medical History:  Diagnosis Date   Adenocarcinoma ([place])    of the prostate, 5% of single core 01/2011-watching and waiting for now   Aortic stenosis 02/11/2014   moderate by echo 05/2017   Asthma    CAD (coronary artery disease), native coronary artery    LIMA to LAD, SVG to OM1, SVG to RCA-RPL, EVH via right thigh and leg   Colon polyp    Environmental allergies    Hyperlipidemia    Kidney stone    Type II or unspecified type diabetes mellitus without mention of complication, not stated as uncontrolled, borderline    Past Surgical History:  Procedure Laterality Date   CORONARY ARTERY BYPASS GRAFT N/A 02/13/2014   Procedure: CORONARY ARTERY BYPASS GRAFTING (CABG) TIMES FOUR USING LEFT INTERNAL MAMMARY ARTERY AND RIGHT SAPHENOUS LEG VEIN HARVESTED ENDOSCOPICALLY;  Surgeon: Rexene Alberts, MD;  Location: [place];  Service: Open Heart Surgery;  Laterality: N/A;   INTRAOPERATIVE TRANSESOPHAGEAL ECHOCARDIOGRAM N/A 02/13/2014   Procedure: INTRAOPERATIVE TRANSESOPHAGEAL ECHOCARDIOGRAM;  Surgeon: Rexene Alberts, MD;  Location: [place];  Service: Open Heart Surgery;  Laterality: N/A;   LEFT HEART CATHETERIZATION WITH CORONARY ANGIOGRAM N/A 02/12/2014   Procedure: LEFT HEART CATHETERIZATION WITH CORONARY ANGIOGRAM;  Surgeon: Juanda Bond  Burt Knack, MD;  Location: [facility] CATH LAB;  Service: Cardiovascular;  Laterality: N/A;     No outpatient medications have been marked as taking for the 01/03/19 encounter (Appointment) with Consuelo Pandy, PA-C.     Allergies:   Patient has no known allergies.   Social History   Tobacco Use   Smoking status: Never Smoker   Smokeless tobacco: Never Used   Substance Use Topics   Alcohol use: No   Drug use: No     Family Hx: The patient's family history includes Alzheimer's disease in his mother; Cancer in his father; Cancer - Lung in his father; Heart attack in his maternal grandfather; Stroke in his mother.  ROS:   Please see the history of present illness.     All other systems reviewed and are negative.   Prior CV studies:   The following studies were reviewed today:  2D Echo 12/12/18   1. The left ventricle has normal systolic function, with an ejection fraction of 55-60%. The cavity size was normal. Left ventricular diastolic Doppler parameters are consistent with pseudonormalization.  2. The right ventricle has normal systolic function. The cavity was normal. There is no increase in right ventricular wall thickness.  3. Left atrial size was moderately dilated.  4. The aortic valve is tricuspid. Severe calcifcation of the aortic valve. Aortic valve regurgitation is mild to moderate by color flow Doppler. Moderate-severe stenosis of the aortic valve.  5. The aorta is normal in size and structure.  Labs/Other Tests and Data Reviewed:    EKG:  An ECG dated 07/06/18 was personally reviewed today and demonstrated:  NSR 69 bpm, no ischemic abnormalities   Recent Labs: 07/13/2018: ALT 25   Recent Lipid Panel Lab Results  Component Value Date/Time   CHOL 136 07/13/2018 08:05 AM   TRIG 137 07/13/2018 08:05 AM   HDL 35 (L) 07/13/2018 08:05 AM   CHOLHDL 3.9 07/13/2018 08:05 AM   CHOLHDL 3.6 05/03/2016 09:09 AM   LDLCALC 74 07/13/2018 08:05 AM    Wt Readings from Last 3 Encounters:  07/06/18 172 lb 9.6 oz (78.3 kg)  08/11/17 174 lb 3.2 oz (79 kg)  06/10/17 177 lb 9.6 oz (80.6 kg)     Objective:    Vital Signs:  There were no vitals taken for this visit.   VITAL SIGNS:  reviewed GEN:  no acute distress EYES:  sclerae anicteric, EOMI - Extraocular Movements Intact RESPIRATORY:  normal respiratory effort, symmetric  expansion CARDIOVASCULAR:  no peripheral edema SKIN:  no rash, lesions or ulcers. MUSCULOSKELETAL:  no obvious deformities. NEURO:  alert and oriented x 3, no obvious focal deficit PSYCH:  normal affect  ASSESSMENT & PLAN:    1. CAD: Status post CABG in 2015.  He is stable without anginal symptomatology.  Continue medical therapy for secondary prevention, aspirin, statin, Zetia and beta-blocker.  2. Aortic Stenosis: Closely monitored by echocardiogram every 6 months.  Most recent echocardiogram December 12, 2018 showed moderate to severe aortic stenosis with no significant change from prior echocardiogram done in January 2020.  He is asymptomatic without angina, dyspnea, syncope/near syncope.  Plan to repeat echocardiogram again in 6 months.  3. HLD: LDL goal less than 70 given CAD.  He reports full compliance with atorvastatin and Zetia.  Will obtain fasting lipid panel hepatic function tests.  COVID-19 Education: The signs and symptoms of COVID-19 were discussed with the patient and how to seek care for testing (follow up with PCP or arrange E-visit).  The importance of social distancing was discussed today.  Time:   Today, I have spent 15 minutes with the patient with telehealth technology discussing the above problems.     Medication Adjustments/Labs and Tests Ordered: Current medicines are reviewed at length with the patient today.  Concerns regarding medicines are outlined above.   Tests Ordered: No orders of the defined types were placed in this encounter.   Medication Changes: No orders of the defined types were placed in this encounter.   Follow Up:  In Person in 6 month(s)  Signed, Nelida Gores  01/03/2019 8:47 AM    [place]

## 2019-01-04 ENCOUNTER — Other Ambulatory Visit: Payer: Medicare Other | Admitting: *Deleted

## 2019-01-04 ENCOUNTER — Other Ambulatory Visit: Payer: Self-pay

## 2019-01-04 DIAGNOSIS — E785 Hyperlipidemia, unspecified: Secondary | ICD-10-CM

## 2019-01-04 LAB — HEPATIC FUNCTION PANEL
ALT: 34 IU/L (ref 0–44)
AST: 30 IU/L (ref 0–40)
Albumin: 4.7 g/dL (ref 3.8–4.8)
Alkaline Phosphatase: 85 IU/L (ref 39–117)
Bilirubin Total: 0.9 mg/dL (ref 0.0–1.2)
Bilirubin, Direct: 0.19 mg/dL (ref 0.00–0.40)
Total Protein: 6.8 g/dL (ref 6.0–8.5)

## 2019-01-04 LAB — LIPID PANEL
Chol/HDL Ratio: 4.4 ratio (ref 0.0–5.0)
Cholesterol, Total: 157 mg/dL (ref 100–199)
HDL: 36 mg/dL — ABNORMAL LOW (ref >39)
LDL Calculated: 85 mg/dL (ref 0–99)
Triglycerides: 182 mg/dL — ABNORMAL HIGH (ref 0–149)
VLDL Cholesterol Cal: 36 mg/dL (ref 5–40)

## 2019-01-05 ENCOUNTER — Telehealth: Payer: Self-pay

## 2019-01-05 NOTE — Telephone Encounter (Signed)
LMTCB

## 2019-01-05 NOTE — Telephone Encounter (Signed)
-----   Message from Charlie Pitter, [place] sent at 01/05/2019  7:57 AM EDT ----- I am covering [employer] box. Rico Junker is out so was asked to send to triage. He is on max dose atorvastatin and Zetia yet LDL still above goal as is triglycerides. At this point, need to consider PCSK9 inhibitors. Please refer to lipid clinic if agreeable. Also continually important to maintain a heart healthy diet and exercise as able Melina Copa PA-C

## 2019-01-05 NOTE — Telephone Encounter (Signed)
Notes recorded by Frederik Schmidt, RN on 01/05/2019 at 10:15 AM EDT  [email or web address] 8/21  ------

## 2019-01-05 NOTE — Telephone Encounter (Signed)
-----   Message from Rodman Key, RN sent at 01/05/2019 10:07 AM EDT ----- Will route to [employer] primary Designer, television/film set.

## 2019-01-08 ENCOUNTER — Telehealth: Payer: Self-pay

## 2019-01-08 DIAGNOSIS — E781 Pure hyperglyceridemia: Secondary | ICD-10-CM

## 2019-01-08 DIAGNOSIS — E785 Hyperlipidemia, unspecified: Secondary | ICD-10-CM

## 2019-01-08 NOTE — Telephone Encounter (Signed)
Notes recorded by Frederik Schmidt, RN on 01/08/2019 at 8:52 AM EDT  The patient has been notified of the result and verbalized understanding. All questions (if any) were answered.  Frederik Schmidt, RN 01/08/2019 8:52 AM

## 2019-01-08 NOTE — Telephone Encounter (Signed)
-----   Message from Rodman Key, RN sent at 01/05/2019 10:07 AM EDT ----- Will route to [employer] primary Designer, television/film set.

## 2019-01-09 ENCOUNTER — Other Ambulatory Visit: Payer: Self-pay | Admitting: Cardiology

## 2019-01-10 ENCOUNTER — Ambulatory Visit (INDEPENDENT_AMBULATORY_CARE_PROVIDER_SITE_OTHER): Payer: Medicare Other | Admitting: Podiatry

## 2019-01-10 ENCOUNTER — Other Ambulatory Visit: Payer: Self-pay

## 2019-01-10 ENCOUNTER — Encounter: Payer: Self-pay | Admitting: Podiatry

## 2019-01-10 ENCOUNTER — Other Ambulatory Visit: Payer: Self-pay | Admitting: Podiatry

## 2019-01-10 DIAGNOSIS — M722 Plantar fascial fibromatosis: Secondary | ICD-10-CM | POA: Diagnosis not present

## 2019-01-10 MED ORDER — MELOXICAM 15 MG PO TABS: 15.0000 mg | ORAL_TABLET | Freq: Every day | ORAL | 0 refills | Status: DC

## 2019-01-10 NOTE — Progress Notes (Signed)
This patient presents the office for diagnosis of plantar fasciitis right foot.  He was also diagnosed as having a porokeratosis secondary to a plantarflexed metatarsal left foot.  This patient states that his right foot is better with the medicine and the power step insoles.  He says that the pain is only 2 out of 10 today.  He states he feels his right foot is much better.  Patient has no complaints concerning his left foot.  He says it took time for him to be able to wear the power step insoles but he is now comfortable with these insoles.  He presents the office today for an evaluation and treatment   Vascular  Dorsalis pedis and posterior tibial pulses are palpable  B/L.  Capillary return  WNL.  Temperature gradient is  WNL.  Skin turgor  WNL  Sensorium  Senn Weinstein monofilament wire  WNL. Normal tactile sensation.  Nail Exam  Patient has normal nails with no evidence of bacterial or fungal infection.  Orthopedic  Exam  Muscle tone and muscle strength  WNL.  No limitations of motion feet  B/L.  No crepitus or joint effusion noted.  Foot type is unremarkable and digits show no abnormalities.  Bony prominences are unremarkable.  Mild palpable pain noted at the insertion of the plantar fascia right foot.  Dorsal exostosis first metatarsal right foot.    Skin  No open lesions.  Normal skin texture and turgor.    Plantar fasciitis right foot.  ROV.  Discussed my clinical findings with this patient.  We decided to prescribe him 15 mg of Mobic and told him to continue to wear the power step insoles.  Patient is to call the office if this problem persists  Gardiner Barefoot DPM

## 2019-01-19 ENCOUNTER — Other Ambulatory Visit: Payer: Self-pay | Admitting: Cardiology

## 2019-01-29 ENCOUNTER — Other Ambulatory Visit: Payer: Self-pay | Admitting: *Deleted

## 2019-01-29 MED ORDER — ATORVASTATIN CALCIUM 80 MG PO TABS: ORAL_TABLET | ORAL | 10 refills | Status: DC

## 2019-01-29 NOTE — Addendum Note (Signed)
Addended by: Juventino Slovak on: 01/29/2019 04:49 PM   Modules accepted: Orders

## 2019-01-29 NOTE — Telephone Encounter (Signed)
Patient called and wanted to know why the refill request for atorvastatin was denied by our office. After reviewing his chart it appears that this was denied as he should still have refills on file at [facility]. Patient stated that he was trying to transfer his refills but it did not work out so he would still like to continue to pick them up at [facility] at this time. I made him aware that I would resend the rx. He only wants thirty day refills as he says that he has an upcoming appt to discuss his statin therapy. Rx sent in as requested. Patient verbalized his understanding and appreciation.

## 2019-02-04 NOTE — Progress Notes (Signed)
Patient ID: Melvin Dunn                 DOB: [birth year]                    MRN: [id number]     HPI: Melvin Dunn is a 68 y.o. male patient referred to lipid clinic by Dr. Radford Pax. PMH is significant for aortic stenosis, diabetes, hyperlipidemia, and CAD s/p CABG in 2015. Last visit with Lyda Jester, PA was on 01/03/19 for follow up with fasting lipid panel. TG and LDL both above goal at 182 and 85 respectively. Patient was then referred to lipid clinic for consideration of PCSK9-inhibitor.   Patient presents for initial visit to lipid clinic today. He reports adherence to his medications and has no complaints regarding his current regimen. After reviewing diet and exercise habits, his carbohydrate intake and reduction in the amount of fish consumed per week are the likely causes of increased LDL and TG.   Current Medications: atorvastatin 80 mg daily, Zetia 10 mg daily  Intolerances: None   Risk Factors: CAD, HLD, aortic stenosis, diabetes  LDL goal: <70  Diet: 2 blueberry waffles with peanut butter every morning, chicken salad or pimento cheese sandwich on honey oat bread with chips for lunch, dinner varies - chicken, some red meat, not as much fish as he used to eat; mashed potatoes 2-3 times per week, occasional fruit  Drinks diet sodas  Exercise: still employed - job requires lots of walking (1.5-2 miles per day)  Family History: Alzheimer's disease and stroke in his mother; lung cancer in his father; Heart attack in his maternal grandfather  Social History: Never smoker, no alcohol use in the last 20 years  Labs: 01/04/19: TC 157, TG 182, HDL 36, LDL 85 (atorvastatin 80 mg, Zetia 10 mg) 07/13/18: TC 136, TG 137, HDL 35, LDL 74 (atorvastatin 80 mg, Zetia 10 mg) 06/17/17: TC 123, TG 96, HDL 32, LDL 72 (atorvastatin 80 mg, Zetia 10 mg)  Past Medical History:  Diagnosis Date  . Adenocarcinoma ([place])    of the prostate, 5% of single core 01/2011-watching and waiting for now  . Aortic  stenosis 02/11/2014   moderate by echo 05/2017  . Asthma   . CAD (coronary artery disease), native coronary artery    LIMA to LAD, SVG to OM1, SVG to RCA-RPL, EVH via right thigh and leg  . Colon polyp   . Environmental allergies   . Hyperlipidemia   . Kidney stone   . Type II or unspecified type diabetes mellitus without mention of complication, not stated as uncontrolled, borderline     Current Outpatient Medications on File Prior to Visit  Medication Sig Dispense Refill  . albuterol (PROAIR HFA) 108 (90 BASE) MCG/ACT inhaler Inhale 1-2 puffs into the lungs daily as needed for wheezing or shortness of breath.    Marland Kitchen aspirin EC 81 MG tablet Take 1 tablet (81 mg total) by mouth daily. 90 tablet 3  . atorvastatin (LIPITOR) 80 MG tablet TAKE 1 TABLET BY MOUTH EVERYDAY AT BEDTIME 30 tablet 10  . Cinnamon 500 MG capsule Take 1,000 mg by mouth daily. Take 2 Capsule Equal 1000 IU    . ezetimibe (ZETIA) 10 MG tablet TAKE 1 TABLET BY MOUTH EVERY DAY 90 tablet 3  . fluticasone (FLONASE) 50 MCG/ACT nasal spray Place 2 sprays into both nostrils at bedtime.    . meloxicam (MOBIC) 15 MG tablet Take 1 tablet (15 mg total)  by mouth daily. 30 tablet 0  . metoprolol tartrate (LOPRESSOR) 50 MG tablet Take 50 mg by mouth 2 (two) times daily.    . Multiple Vitamin (MULTIVITAMIN WITH MINERALS) TABS tablet Take 1 tablet by mouth daily.    . nitroGLYCERIN (NITROSTAT) 0.4 MG SL tablet Place 1 tablet (0.4 mg total) under the tongue every 5 (five) minutes as needed for chest pain. 25 tablet 3  . Omega-3 Krill Oil 500 MG CAPS Take 1 capsule by mouth daily.     No current facility-administered medications on file prior to visit.     No Known Allergies  Assessment/Plan:  1. Hyperlipidemia - LDL and TG above goal (<70 and <150 respectively). Continue taking atorvastatin 80 mg once daily and Zetia 10 mg once daily. Educated on the causes for elevated LDL and TG and identified patient factors likely contributing to  his abnormal cholesterol panel. Identified lifestyle modifications and set 3 goals to adjust diet to minimize carbohydrate intake and increase consumption of fish as these may be the most likely causes for change in lipid panel. Discussed initiating PCSK9-inhibitor (Praluent 75 mg [place] once every 2 weeks) for additional lipid lowering and cardiovascular benefit. Counseled on dosing, administration, and possible side effects. Patient ultimately decided against adding on PCSK9-inhibitor therapy at this time and would like to pursue diet modifications alone. Recommended to follow up with repeat lipid panel in 3 months and can reassess medication therapy and lipid clinic follow up at that time.   Vertis Kelch, PharmD PGY2 Cardiology Pharmacy Resident [place] [id number] [place], [place] [address] Phone: [phone]; Fax: [phone]

## 2019-02-05 ENCOUNTER — Other Ambulatory Visit: Payer: Self-pay

## 2019-02-05 ENCOUNTER — Ambulatory Visit (INDEPENDENT_AMBULATORY_CARE_PROVIDER_SITE_OTHER): Payer: Medicare Other

## 2019-02-05 VITALS — BP 162/78 | HR 68

## 2019-02-05 DIAGNOSIS — E785 Hyperlipidemia, unspecified: Secondary | ICD-10-CM | POA: Diagnosis not present

## 2019-02-05 NOTE — Patient Instructions (Addendum)
Thank you for seeing [place] today!  Your LDL cholesterol is above goal (goal < 70) and triglycerides are above goal (goal <150)  Changing your diet as we discussed can help lower these cholesterol numbers: - substituting mashed potatoes for non-starchy vegetables - substituting sandwich bread for whole wheat bread - eating more fish throughout the week   Discussion about Praluent 75 mg injection once every two weeks - you would like to hold off on this for now and focus on diet alone  Repeat cholesterol panel in about 3 months  We will follow up with you regarding any medication changes at that time.   Please call us at [phone] if you have any questions or concerns.

## 2019-06-18 HISTORY — PX: PROSTATE BIOPSY: SHX241

## 2019-06-29 ENCOUNTER — Other Ambulatory Visit: Payer: Self-pay | Admitting: Urology

## 2019-06-29 DIAGNOSIS — C61 Malignant neoplasm of prostate: Secondary | ICD-10-CM

## 2019-07-02 NOTE — Progress Notes (Signed)
Cardiology Office Note:    Date:  07/03/2019   ID:  [name], DOB May 27, 1950, MRN [id number]  PCP:  Shirline Frees, MD  Cardiologist:  No primary care provider on file.    Referring MD: Shirline Frees, MD   Chief Complaint  Patient presents with  . Coronary Artery Disease  . Aortic Stenosis  . Hyperlipidemia    History of Present Illness:    CANDY ARAMBUL is a 69 y.o. male with a hx of dyslipidemia, diabetes and ASCAD with cath revealing critical left main stenosis and multivessel ASCAD with normal LVF. He underwent CABG with LIMA to LAD, SVG to OM, SVG to distal RCA/PL.  He also has a hx of moderate to severe AS with last echo 11/2018 showing normal LVF with EF 55-60% with moderate to severe AS with peak AV velocity of 339cm/s, mean AVG 36mmHg, AVA 0.77cm2 and dimensionless index 0.29. He has been asymptomatic.   He is here today for followup and is doing well.  He denies any chest pain or pressure, SOB, DOE, PND, orthopnea, LE edema, dizziness, palpitations or syncope. He is compliant with his meds and is tolerating meds with no SE.    Past Medical History:  Diagnosis Date  . Adenocarcinoma ([place])    of the prostate, 5% of single core 01/2011-watching and waiting for now  . Aortic stenosis 02/11/2014   moderate by echo 05/2017  . Asthma   . CAD (coronary artery disease), native coronary artery    LIMA to LAD, SVG to OM1, SVG to RCA-RPL, EVH via right thigh and leg  . Colon polyp   . Environmental allergies   . Hyperlipidemia   . Kidney stone   . Type II or unspecified type diabetes mellitus without mention of complication, not stated as uncontrolled, borderline     Past Surgical History:  Procedure Laterality Date  . CORONARY ARTERY BYPASS GRAFT N/A 02/13/2014   Procedure: CORONARY ARTERY BYPASS GRAFTING (CABG) TIMES FOUR USING LEFT INTERNAL MAMMARY ARTERY AND RIGHT SAPHENOUS LEG VEIN HARVESTED ENDOSCOPICALLY;  Surgeon: Rexene Alberts, MD;  Location: [facility];  Service: Open  Heart Surgery;  Laterality: N/A;  . INTRAOPERATIVE TRANSESOPHAGEAL ECHOCARDIOGRAM N/A 02/13/2014   Procedure: INTRAOPERATIVE TRANSESOPHAGEAL ECHOCARDIOGRAM;  Surgeon: Rexene Alberts, MD;  Location: [facility];  Service: Open Heart Surgery;  Laterality: N/A;  . LEFT HEART CATHETERIZATION WITH CORONARY ANGIOGRAM N/A 02/12/2014   Procedure: LEFT HEART CATHETERIZATION WITH CORONARY ANGIOGRAM;  Surgeon: Blane Ohara, MD;  Location: [facility] CATH LAB;  Service: Cardiovascular;  Laterality: N/A;    Current Medications: Current Meds  Medication Sig  . albuterol (PROAIR HFA) 108 (90 BASE) MCG/ACT inhaler Inhale 1-2 puffs into the lungs daily as needed for wheezing or shortness of breath.  Marland Kitchen aspirin EC 81 MG tablet Take 1 tablet (81 mg total) by mouth daily.  Marland Kitchen atorvastatin (LIPITOR) 80 MG tablet TAKE 1 TABLET BY MOUTH EVERYDAY AT BEDTIME  . Cinnamon 500 MG capsule Take 1,000 mg by mouth daily. Take 2 Capsule Equal 1000 IU  . ezetimibe (ZETIA) 10 MG tablet TAKE 1 TABLET BY MOUTH EVERY DAY  . fluticasone (FLONASE) 50 MCG/ACT nasal spray Place 2 sprays into both nostrils at bedtime.  Marland Kitchen levocetirizine (XYZAL) 5 MG tablet Take 5 mg by mouth at bedtime as needed.  . meloxicam (MOBIC) 15 MG tablet Take 1 tablet (15 mg total) by mouth daily.  . metoprolol tartrate (LOPRESSOR) 50 MG tablet Take 50 mg by mouth 2 (two) times  daily.  . Multiple Vitamin (MULTIVITAMIN WITH MINERALS) TABS tablet Take 1 tablet by mouth daily.  . nitroGLYCERIN (NITROSTAT) 0.4 MG SL tablet Place 1 tablet (0.4 mg total) under the tongue every 5 (five) minutes as needed for chest pain.  . Omega-3 Krill Oil 500 MG CAPS Take 1 capsule by mouth daily.  Marland Kitchen triamcinolone cream (KENALOG) 0.1 % Apply 1 application topically 2 (two) times daily.     Allergies:   Patient has no known allergies.   Social History   Socioeconomic History  . Marital status: Single    Spouse name: Not on file  . Number of children: Not on file  . Years of education:  Not on file  . Highest education level: Not on file  Occupational History  . Not on file  Tobacco Use  . Smoking status: Never Smoker  . Smokeless tobacco: Never Used  Substance and Sexual Activity  . Alcohol use: No  . Drug use: No  . Sexual activity: Not on file  Other Topics Concern  . Not on file  Social History Narrative  . Not on file   Social Determinants of Health   Financial Resource Strain:   . Difficulty of Paying Living Expenses: Not on file  Food Insecurity:   . Worried About Charity fundraiser in the Last Year: Not on file  . Ran Out of Food in the Last Year: Not on file  Transportation Needs:   . Lack of Transportation (Medical): Not on file  . Lack of Transportation (Non-Medical): Not on file  Physical Activity:   . Days of Exercise per Week: Not on file  . Minutes of Exercise per Session: Not on file  Stress:   . Feeling of Stress : Not on file  Social Connections:   . Frequency of Communication with Friends and Family: Not on file  . Frequency of Social Gatherings with Friends and Family: Not on file  . Attends Religious Services: Not on file  . Active Member of Clubs or Organizations: Not on file  . Attends Archivist Meetings: Not on file  . Marital Status: Not on file     Family History: The patient's family history includes Alzheimer's disease in his mother; Cancer in his father; Cancer - Lung in his father; Heart attack in his maternal grandfather; Stroke in his mother.  ROS:   Please see the history of present illness.    ROS  All other systems reviewed and negative.   EKGs/Labs/Other Studies Reviewed:    The following studies were reviewed today: 2D echo 11/2018, outside labs from [facility]  EKG:  EKG is  ordered today.  The ekg ordered today demonstrates NSR with nonspecific ST/T wave abnormlaity  Recent Labs: 01/04/2019: ALT 34   Recent Lipid Panel    Component Value Date/Time   CHOL 157 01/04/2019 0726   TRIG 182 (H)  01/04/2019 0726   HDL 36 (L) 01/04/2019 0726   CHOLHDL 4.4 01/04/2019 0726   CHOLHDL 3.6 05/03/2016 0909   VLDL 18 05/03/2016 0909   LDLCALC 85 01/04/2019 0726    Physical Exam:    VS:  BP 132/80   Pulse 67   Ht 5\' 7"  (1.702 m)   Wt 169 lb 12.8 oz (77 kg)   BMI 26.59 kg/m     Wt Readings from Last 3 Encounters:  07/03/19 169 lb 12.8 oz (77 kg)  01/03/19 160 lb (72.6 kg)  07/06/18 172 lb 9.6 oz (78.3 kg)  GEN:  Well nourished, well developed in no acute distress HEENT: Normal NECK: No JVD; No carotid bruits LYMPHATICS: No lymphadenopathy CARDIAC: RRR, no  rubs, gallops.  2/6 SM mid peaking SM at RUSB to [place] with intact A2 component of second heart sound RESPIRATORY:  Clear to auscultation without rales, wheezing or rhonchi  ABDOMEN: Soft, non-tender, non-distended MUSCULOSKELETAL:  No edema; No deformity  SKIN: Warm and dry NEUROLOGIC:  Alert and oriented x 3 PSYCHIATRIC:  Normal affect   ASSESSMENT:    1. Coronary artery disease involving native coronary artery of native heart without angina pectoris   2. Nonrheumatic aortic valve stenosis   3. Pure hypercholesterolemia   4. DM type 2, goal HbA1c < 7% (HCC)    PLAN:    In order of problems listed above:  1.  ASCAD -cath 2015  critical left main stenosis and multivessel ASCAD with normal LVF. -s/p CABG with LIMA to LAD, SVG to OM, SVG to distal RCA/PL. -denies any anginal sx -continue ASA 81mg  daily, Lopressor 50mg  BID, statin  2.  HLD -LDL goal < 70 -LDL from outside labs on KPN was 85 ib 01/04/2019 -continue atorvastatin 80mg  daily and Zetia 10mg  daily  -repeat FLp and ALT  3.  Nonrheumatic aortic stenosis -moderate to severe by echo 11/2018 with peak AV velocity of 339cm/s, mean AVG 55mmHg, AVA 0.77cm2 and dimensionless index 0.29 and mild AI. -he is asymptomatic -repeat 2D echo to make sure AS is stable  4.  DM type 2 -followed by PCP -curremtly diet controlled -HbA1C on KPN was 6,5% in Sept  2020  Medication Adjustments/Labs and Tests Ordered: Current medicines are reviewed at length with the patient today.  Concerns regarding medicines are outlined above.  Orders Placed This Encounter  Procedures  . EKG 12-Lead   No orders of the defined types were placed in this encounter.   Signed, Fransico Him, MD  07/03/2019 8:19 AM    [place]

## 2019-07-03 ENCOUNTER — Ambulatory Visit: Payer: Medicare Other | Admitting: Cardiology

## 2019-07-03 ENCOUNTER — Other Ambulatory Visit: Payer: Self-pay

## 2019-07-03 ENCOUNTER — Telehealth: Payer: Self-pay

## 2019-07-03 ENCOUNTER — Encounter: Payer: Self-pay | Admitting: Cardiology

## 2019-07-03 ENCOUNTER — Ambulatory Visit (HOSPITAL_COMMUNITY): Payer: Medicare Other | Attending: Cardiovascular Disease

## 2019-07-03 VITALS — BP 132/80 | HR 67 | Ht 67.0 in | Wt 169.8 lb

## 2019-07-03 DIAGNOSIS — I351 Nonrheumatic aortic (valve) insufficiency: Secondary | ICD-10-CM | POA: Diagnosis not present

## 2019-07-03 DIAGNOSIS — I350 Nonrheumatic aortic (valve) stenosis: Secondary | ICD-10-CM

## 2019-07-03 DIAGNOSIS — E7800 Pure hypercholesterolemia, unspecified: Secondary | ICD-10-CM

## 2019-07-03 DIAGNOSIS — E119 Type 2 diabetes mellitus without complications: Secondary | ICD-10-CM | POA: Diagnosis not present

## 2019-07-03 DIAGNOSIS — I2510 Atherosclerotic heart disease of native coronary artery without angina pectoris: Secondary | ICD-10-CM | POA: Diagnosis not present

## 2019-07-03 NOTE — Addendum Note (Signed)
Addended by: Antonieta Iba on: 07/03/2019 08:37 AM   Modules accepted: Orders

## 2019-07-03 NOTE — Telephone Encounter (Signed)
-----   Message from Sueanne Margarita, MD sent at 07/03/2019  2:08 PM EST ----- Echo showed normal heart function with increased stiffness of heart muscle related to aging, moderate AS and mild AI and echo is stable.  Repeat echo in 1 year for AS

## 2019-07-03 NOTE — Patient Instructions (Addendum)
Medication Instructions:  Your physician recommends that you continue on your current medications as directed. Please refer to the Current Medication list given to you today.  *If you need a refill on your cardiac medications before your next appointment, please call your pharmacy*  Lab Work: Fasting lipids and ALT If you have labs (blood work) drawn today and your tests are completely normal, you will receive your results only by: . MyChart Message (if you have MyChart) OR . A paper copy in the mail If you have any lab test that is abnormal or we need to change your treatment, we will call you to review the results.   Follow-Up: At [facility], you and your health needs are our priority.  As part of our continuing mission to provide you with exceptional heart care, we have created designated Provider Care Teams.  These Care Teams include your primary Cardiologist (physician) and Advanced Practice Providers (APPs -  Physician Assistants and Nurse Practitioners) who all work together to provide you with the care you need, when you need it.  Your next appointment:   6 month(s)  The format for your next appointment:   Either In Person or Virtual  Provider:   Traci [name], MD    

## 2019-07-04 ENCOUNTER — Other Ambulatory Visit: Payer: Medicare Other | Admitting: *Deleted

## 2019-07-04 DIAGNOSIS — E7800 Pure hypercholesterolemia, unspecified: Secondary | ICD-10-CM

## 2019-07-04 DIAGNOSIS — I2510 Atherosclerotic heart disease of native coronary artery without angina pectoris: Secondary | ICD-10-CM

## 2019-07-04 LAB — LIPID PANEL
Chol/HDL Ratio: 4.2 ratio (ref 0.0–5.0)
Cholesterol, Total: 139 mg/dL (ref 100–199)
HDL: 33 mg/dL — ABNORMAL LOW (ref >39)
LDL Chol Calc (NIH): 80 mg/dL (ref 0–99)
Triglycerides: 149 mg/dL (ref 0–149)
VLDL Cholesterol Cal: 26 mg/dL (ref 5–40)

## 2019-07-04 LAB — ALT: ALT: 39 IU/L (ref 0–44)

## 2019-07-12 NOTE — Progress Notes (Signed)
Patient ID: SIAM CLEARY                 DOB: April 05, 1951                    MRN: [id number]     HPI: Melvin Dunn is a 69 y.o. male patient referred to lipid clinic by Dr. Radford Pax. PMH is significant for aortic stenosis, diabetes, hyperlipidemia, and CAD s/p CABG in 2015.   Patient was seen previously in lipid clinic (02/05/2019) by Dr. Anabel Bene, patient identified lifestyle modifications and set 3 goals to adjust diet to minimize carbohydrate intake and increase consumption of fish. Patient was educated about PCSK9 inhibitor therapy and politely declined as patient preferred to pursue lifestyle modifications rather than pharmacotherapy.  Patient presents for follow up visit to lipid clinic today. Patient states that he is not interested in injectable medication. He has pretty much cut potatoes out of his diet and he has switched to whole wheat bread.   Current Medications: atorvastatin 80 mg daily, Zetia 10 mg daily  Intolerances: None   Risk Factors: CAD, HLD, aortic stenosis, diabetes  LDL goal: <70 mg/dL   Diet: 2 blueberry waffles with peanut butter every morning, chicken salad or pimento cheese sandwich on honey oat bread with chips for lunch, dinner varies - chicken, some red meat, not as much fish as he used to eat; mashed potatoes 2-3 times per week, occasional fruit  Drinks diet sodas  Exercise: still employed - job requires lots of walking (1.5-2 miles per day)  Family History: Alzheimer's disease and stroke in his mother; lung cancer in his father; Heart attack in his maternal grandfather  Social History: Never smoker, no alcohol use in the last 20 years  Labs: 07/04/19 TC 139 TG 149 VLDL 26 HDL 33 LDL 80 (atorvastatin 80 mg daily, Zetia 10 mg daily) 01/04/19: TC 157, TG 182, HDL 36, LDL 85 (atorvastatin 80 mg, Zetia 10 mg) 07/13/18: TC 136, TG 137, HDL 35, LDL 74 (atorvastatin 80 mg, Zetia 10 mg) 06/17/17: TC 123, TG 96, HDL 32, LDL 72 (atorvastatin 80 mg, Zetia 10 mg)  Past  Medical History:  Diagnosis Date  . Adenocarcinoma ([place])    of the prostate, 5% of single core 01/2011-watching and waiting for now  . Aortic stenosis 02/11/2014   moderate by echo 06/2019  with Aortic valve mean gradient measures 28.5 mmHg. Aortic valve Vmax measures 3.64 m/s.  Marland Kitchen Asthma   . CAD (coronary artery disease), native coronary artery    LIMA to LAD, SVG to OM1, SVG to RCA-RPL, EVH via right thigh and leg  . Colon polyp   . Environmental allergies   . Hyperlipidemia   . Kidney stone   . Type II or unspecified type diabetes mellitus without mention of complication, not stated as uncontrolled, borderline     Current Outpatient Medications on File Prior to Visit  Medication Sig Dispense Refill  . albuterol (PROAIR HFA) 108 (90 BASE) MCG/ACT inhaler Inhale 1-2 puffs into the lungs daily as needed for wheezing or shortness of breath.    Marland Kitchen aspirin EC 81 MG tablet Take 1 tablet (81 mg total) by mouth daily. 90 tablet 3  . atorvastatin (LIPITOR) 80 MG tablet TAKE 1 TABLET BY MOUTH EVERYDAY AT BEDTIME 30 tablet 10  . Cinnamon 500 MG capsule Take 1,000 mg by mouth daily. Take 2 Capsule Equal 1000 IU    . ezetimibe (ZETIA) 10 MG tablet TAKE 1 TABLET BY  MOUTH EVERY DAY 90 tablet 3  . fluticasone (FLONASE) 50 MCG/ACT nasal spray Place 2 sprays into both nostrils at bedtime.    Marland Kitchen levocetirizine (XYZAL) 5 MG tablet Take 5 mg by mouth at bedtime as needed.    . meloxicam (MOBIC) 15 MG tablet Take 1 tablet (15 mg total) by mouth daily. 30 tablet 0  . metoprolol tartrate (LOPRESSOR) 50 MG tablet Take 50 mg by mouth 2 (two) times daily.    . Multiple Vitamin (MULTIVITAMIN WITH MINERALS) TABS tablet Take 1 tablet by mouth daily.    . nitroGLYCERIN (NITROSTAT) 0.4 MG SL tablet Place 1 tablet (0.4 mg total) under the tongue every 5 (five) minutes as needed for chest pain. 25 tablet 3  . Omega-3 Krill Oil 500 MG CAPS Take 1 capsule by mouth daily.    Marland Kitchen triamcinolone cream (KENALOG) 0.1 % Apply 1  application topically 2 (two) times daily.     No current facility-administered medications on file prior to visit.    No Known Allergies  Assessment/Plan:  1. Hyperlipidemia - Patient's LDL is above goal < 70 mg/dL. I did discuss PCSK9i therapy and its cardiovascular risk reduction data. Patient does not wish to do injectable therapy. We also discussed Nexlizet and patient is agreeable. We discussed potential cost and possibility of healthwell grant. Patient is still working and thinks he might be close to the income cutoff. I will submit prior authorization for Nexlizet and will contact patient once approved and I know the cost.  Thank you for involving pharmacy to assist in providing this patient's care.   Ramond Dial, Pharm.D, BCPS, CPP [place]  [id number], [place], [place] [address]  Phone: [phone]; Fax: [phone]

## 2019-07-17 ENCOUNTER — Other Ambulatory Visit: Payer: Self-pay

## 2019-07-17 ENCOUNTER — Encounter: Payer: Self-pay | Admitting: Pharmacist

## 2019-07-17 ENCOUNTER — Ambulatory Visit (INDEPENDENT_AMBULATORY_CARE_PROVIDER_SITE_OTHER): Payer: Medicare Other | Admitting: Pharmacist

## 2019-07-17 DIAGNOSIS — E7800 Pure hypercholesterolemia, unspecified: Secondary | ICD-10-CM | POA: Diagnosis not present

## 2019-07-17 NOTE — Patient Instructions (Signed)
It was a pleasure to meet you!  We will start the process for prior authorization for Nexlizet.  I will call you once its approved and we know the cost.  Please give [place] a call at [phone] with any questions or concerns.

## 2019-07-19 ENCOUNTER — Telehealth: Payer: Self-pay | Admitting: Pharmacist

## 2019-07-19 MED ORDER — NEXLIZET 180-10 MG PO TABS: 1.0000 | ORAL_TABLET | Freq: Every day | ORAL | 11 refills | Status: DC

## 2019-07-19 NOTE — Telephone Encounter (Addendum)
Nexlizet prior auth approved though 05/16/20. Nexlizet is tier 4 and is $100/ month. First month is 195 due to deductible. Called patient to discuss cost and see if he would like [place] to apply for healthwell. Patient is still working, but might still meet income requirement.  Called and left VM for patient to call back

## 2019-07-23 ENCOUNTER — Ambulatory Visit
Admission: RE | Admit: 2019-07-23 | Discharge: 2019-07-23 | Disposition: A | Discharge status: Home / Self Care | Payer: Medicare Other | Source: Ambulatory Visit | Attending: Urology | Admitting: Urology

## 2019-07-23 ENCOUNTER — Other Ambulatory Visit: Payer: Self-pay

## 2019-07-23 DIAGNOSIS — C61 Malignant neoplasm of prostate: Secondary | ICD-10-CM

## 2019-07-23 MED ORDER — GADOBENATE DIMEGLUMINE 529 MG/ML IV SOLN
14.0000 mL | Freq: Once | INTRAVENOUS | Status: AC | PRN
  Administered 2019-07-23: 14 mL via INTRAVENOUS

## 2019-07-25 NOTE — Telephone Encounter (Signed)
Pt called clinic, discussed message below, he will look for his income statement from last year to see what his estimated annual income is to see if he meets [place] cutoffs.

## 2019-07-26 NOTE — Telephone Encounter (Signed)
Patient called back with his income. Unfortunately he is over the limit with an income of 90,000 for 2 people. Patient will discuss with his wife if medication is affordable without assistance and will let us know.

## 2019-09-03 MED ORDER — NEXLIZET 180-10 MG PO TABS: 1.0000 | ORAL_TABLET | Freq: Every day | ORAL | 11 refills | Status: DC

## 2019-09-03 NOTE — Addendum Note (Signed)
Addended by: Marcelle Overlie D on: 09/03/2019 02:59 PM   Modules accepted: Orders

## 2019-09-03 NOTE — Telephone Encounter (Signed)
Patient called stating he has decided that he is going to pay the $100 per month for Nexlizet. Rx sent to pharmacy. Advised that he is to continue atorvastatin 80mg , but stop his ezetimibe since its in combo with bempedoic acid in Nexlizet. Will call in 2-3 months to set up labs

## 2019-10-25 ENCOUNTER — Other Ambulatory Visit: Payer: Self-pay

## 2019-10-25 ENCOUNTER — Ambulatory Visit: Payer: Medicare Other | Admitting: Podiatry

## 2019-10-25 ENCOUNTER — Encounter: Payer: Self-pay | Admitting: Podiatry

## 2019-10-25 DIAGNOSIS — M216X2 Other acquired deformities of left foot: Secondary | ICD-10-CM

## 2019-10-25 DIAGNOSIS — E119 Type 2 diabetes mellitus without complications: Secondary | ICD-10-CM

## 2019-10-25 DIAGNOSIS — B351 Tinea unguium: Secondary | ICD-10-CM | POA: Insufficient documentation

## 2019-10-25 DIAGNOSIS — M79674 Pain in right toe(s): Secondary | ICD-10-CM

## 2019-10-25 DIAGNOSIS — Q828 Other specified congenital malformations of skin: Secondary | ICD-10-CM

## 2019-10-25 DIAGNOSIS — M79675 Pain in left toe(s): Secondary | ICD-10-CM | POA: Diagnosis not present

## 2019-10-25 NOTE — Progress Notes (Signed)
This patient returns to my office for at risk foot care.  This patient requires this care by a professional since this patient will be at risk due to having diabetes.    This patient is unable to cut nails himself since the patient cannot reach his nails.These nails are painful walking and wearing shoes.  He also has painful callus on the outside ball of left foot.  This patient presents for at risk foot care today.  Patient has no pain right heel since he started wearing powerstep insoles.  General Appearance  Alert, conversant and in no acute stress.  Vascular  Dorsalis pedis and posterior tibial  pulses are palpable  bilaterally.  Capillary return is within normal limits  bilaterally. Temperature is within normal limits  bilaterally.  Neurologic  Senn-Weinstein monofilament wire test within normal limits  bilaterally. Muscle power within normal limits bilaterally.  Nails Thick disfigured discolored nails with subungual debris  from hallux to fifth toes bilaterally. No evidence of bacterial infection or drainage bilaterally.  Orthopedic  No limitations of motion  feet .  No crepitus or effusions noted.  No bony pathology or digital deformities noted. Dorsal exostosis 1st met. Right foot.  Prominent metatarsal fourth met left foot.  Skin  normotropic skin noted bilaterally.  No signs of infections or ulcers noted.   Porokeratosis sub 4th left foot.  Onychomycosis  Pain in right toes  Pain in left toes  Porokeratosis left forefoot.   Consent was obtained for treatment procedures.   Mechanical debridement of nails 1-5  bilaterally performed with a nail nipper.  Filed with dremel without incident. Debridement of callus with # 15 blade.  Dispense gel padding sheeting.   Return office visit     prn                Told patient to return for periodic foot care and evaluation due to potential at risk complications.   Gardiner Barefoot DPM

## 2019-11-01 NOTE — Progress Notes (Signed)
GU Location of Tumor / Histology: prostatic adenocarcinoma  If Prostate Cancer, Gleason Score is (4 + 3) and PSA is (10.60). Prostate volume: 34.24 grams.  Melvin Dunn has been under active surveillance since August 2012 when he was diagnosed with prostatic adenocarcinoma, gleason 3+3.     Biopsies of prostate (if applicable) revealed:   Past/Anticipated interventions by urology, if any: prostate biopsy, active surveillance, referral to Dr. Tammi Klippel for consideration of radioactive seed implant. Dr. Karsten Ro is nearing retirement thus one of his partners have need to implant the seeds.  Past/Anticipated interventions by medical oncology, if any: no  Weight changes, if any: denies  Bowel/Bladder complaints, if any: IPSS 1 with nocturia. SHIM 2 without sexual activity. Denies dysuria, hematuria, urinary leakage or incontinence. Denies any bowel complaints.   Nausea/Vomiting, if any: no  Pain issues, if any:  Ankle pain related to old injury  SAFETY ISSUES: Prior radiation? no Pacemaker/ICD? no Possible current pregnancy? no, male patient Is the patient on methotrexate? no  Current Complaints / other details:  69 year old male. Married. Continues to work full time.

## 2019-11-02 ENCOUNTER — Ambulatory Visit
Admission: RE | Admit: 2019-11-02 | Discharge: 2019-11-02 | Disposition: A | Discharge status: Home / Self Care | Payer: Medicare Other | Source: Ambulatory Visit | Attending: Radiation Oncology | Admitting: Radiation Oncology

## 2019-11-02 ENCOUNTER — Ambulatory Visit: Payer: Medicare Other

## 2019-11-06 ENCOUNTER — Encounter: Payer: Self-pay | Admitting: Radiation Oncology

## 2019-11-06 ENCOUNTER — Ambulatory Visit
Admission: RE | Admit: 2019-11-06 | Discharge: 2019-11-06 | Disposition: A | Discharge status: Home / Self Care | Payer: Medicare Other | Source: Ambulatory Visit | Attending: Radiation Oncology | Admitting: Radiation Oncology

## 2019-11-06 VITALS — Ht 66.0 in | Wt 155.0 lb

## 2019-11-06 DIAGNOSIS — C61 Malignant neoplasm of prostate: Secondary | ICD-10-CM

## 2019-11-06 HISTORY — DX: Malignant neoplasm of prostate: C61

## 2019-11-06 NOTE — Progress Notes (Signed)
Radiation Oncology         [phone] ________________________________  Initial Outpatient Consultation - Conducted via Telephone due to current COVID-19 concerns for limiting patient exposure  Name: Melvin Dunn MRN: [id number]  Date: 11/06/2019  DOB: 1951-04-28  [id number], Gwyndolyn Saxon, MD  Kathie Rhodes, MD   REFERRING PHYSICIAN: Kathie Rhodes, MD  DIAGNOSIS: 69 y.o. gentleman with Stage T1c adenocarcinoma of the prostate with Gleason score of 4+3, and PSA of 10.6.    ICD-10-CM   1. Malignant neoplasm of prostate ([place])  C61     HISTORY OF PRESENT ILLNESS: Melvin Dunn is a 69 y.o. male with a diagnosis of prostate cancer.  He was initially diagnosed by Dr. Karsten Ro with very low volume Gleason 3+3 prostate cancer on 01/08/2011 with a PSA of 4. At that time, he reasonably opted to proceed with active surveillance.   Since that time, his PSA fluctuated between 4 and 10. He underwent surveillance prostate MRI on 11/25/2017 showing a 1.9 cm PI-RADS 4l lesion in the left posterolateral mid gland to apex of the peripheral zone and a 1.4 cm PI-RADS 3 lesion in the right posterolateral apex of the central gland with possible early extracapsular extension on the left with suspected involvement of the neurovascular bundle. PSA was 5.53 at that time. He proceeded to MRI fusion biopsy on 12/29/2017, which revealed upstaging of his disease. Out of 19 cores, 5 were positive. The highest Gleason score was 3+4 and this was seen in 3/4 of the ROI MRI lesion #1 cores, one of which had PNI, and in the left base lateral core. Additionally, there was Gleason 3+3 in the right base. Despite the upstaging of disease to favorable intermediate risk prostate cancer he opted to continue in active surveillance.  His PSA continued to gradually rise with some fluctuation but increased to 10.6 in 06/2019. This prompted a repeat prostate MRI on 07/23/2019 which demonstrated signs of two PIRADS 4 lesions in bilateral peripheral zones,  with diffuse abnormality noted throughout left peripheral zone and a similar appearance of suspected left-sided extracapsular extension. MR fusion biopsy was attempted on 08/15/2019, but he experienced a seizure following injection of 20 cc of lidocaine and the procedure had to be postponed.  He returned for MRI fusion biopsy on 09/05/2019.  The prostate volume measured 33 cc.  Out of 18 core biopsies, 8 were positive.  The maximum Gleason score was 4+3, and this was seen in all 3 of ROI MRI lesion #1 samples (all with PNI), left mid lateral, left apex lateral, left mid (with PNI), and right apex lateral. A small focus of Gleason 3+3 was noted in one of the ROI MRI lesion #2 cores.  The patient reviewed the biopsy results with his urologist and he has kindly been referred today for discussion of potential radiation treatment options.   PREVIOUS RADIATION THERAPY: No  PAST MEDICAL HISTORY:  Past Medical History:  Diagnosis Date  . Adenocarcinoma ([place])    of the prostate, 5% of single core 01/2011-watching and waiting for now  . Aortic stenosis 02/11/2014   moderate by echo 06/2019  with Aortic valve mean gradient measures 28.5 mmHg. Aortic valve Vmax measures 3.64 m/s.  Marland Kitchen Asthma   . CAD (coronary artery disease), native coronary artery    LIMA to LAD, SVG to OM1, SVG to RCA-RPL, EVH via right thigh and leg  . Colon polyp   . Environmental allergies   . Hyperlipidemia   . Kidney stone   .  Prostate cancer ([place])   . Type II or unspecified type diabetes mellitus without mention of complication, not stated as uncontrolled, borderline       PAST SURGICAL HISTORY: Past Surgical History:  Procedure Laterality Date  . CORONARY ARTERY BYPASS GRAFT N/A 02/13/2014   Procedure: CORONARY ARTERY BYPASS GRAFTING (CABG) TIMES FOUR USING LEFT INTERNAL MAMMARY ARTERY AND RIGHT SAPHENOUS LEG VEIN HARVESTED ENDOSCOPICALLY;  Surgeon: Rexene Alberts, MD;  Location: [facility];  Service: Open Heart Surgery;  Laterality:  N/A;  . INTRAOPERATIVE TRANSESOPHAGEAL ECHOCARDIOGRAM N/A 02/13/2014   Procedure: INTRAOPERATIVE TRANSESOPHAGEAL ECHOCARDIOGRAM;  Surgeon: Rexene Alberts, MD;  Location: [facility];  Service: Open Heart Surgery;  Laterality: N/A;  . LEFT HEART CATHETERIZATION WITH CORONARY ANGIOGRAM N/A 02/12/2014   Procedure: LEFT HEART CATHETERIZATION WITH CORONARY ANGIOGRAM;  Surgeon: Blane Ohara, MD;  Location: [facility] CATH LAB;  Service: Cardiovascular;  Laterality: N/A;    FAMILY HISTORY:  Family History  Problem Relation Age of Onset  . Alzheimer's disease Mother   . Stroke Mother   . Skin cancer Mother   . Cancer - Lung Father   . Cancer Father        asbestos  . Heart attack Maternal Grandfather   . Breast cancer Neg Hx   . Colon cancer Neg Hx   . Prostate cancer Neg Hx   . Pancreatic cancer Neg Hx     SOCIAL HISTORY:  Social History   Socioeconomic History  . Marital status: Single    Spouse name: Not on file  . Number of children: Not on file  . Years of education: Not on file  . Highest education level: Not on file  Occupational History    Comment: full time  Tobacco Use  . Smoking status: Never Smoker  . Smokeless tobacco: Never Used  Vaping Use  . Vaping Use: Never used  Substance and Sexual Activity  . Alcohol use: No  . Drug use: No  . Sexual activity: Yes  Other Topics Concern  . Not on file  Social History Narrative  . Not on file   Social Determinants of Health   Financial Resource Strain:   . Difficulty of Paying Living Expenses:   Food Insecurity:   . Worried About Charity fundraiser in the Last Year:   . Arboriculturist in the Last Year:   Transportation Needs:   . Film/video editor (Medical):   Marland Kitchen Lack of Transportation (Non-Medical):   Physical Activity:   . Days of Exercise per Week:   . Minutes of Exercise per Session:   Stress:   . Feeling of Stress :   Social Connections:   . Frequency of Communication with Friends and Family:   . Frequency  of Social Gatherings with Friends and Family:   . Attends Religious Services:   . Active Member of Clubs or Organizations:   . Attends Archivist Meetings:   Marland Kitchen Marital Status:   Intimate Partner Violence:   . Fear of Current or Ex-Partner:   . Emotionally Abused:   Marland Kitchen Physically Abused:   . Sexually Abused:     ALLERGIES: Patient has no known allergies.  MEDICATIONS:  Current Outpatient Medications  Medication Sig Dispense Refill  . albuterol (PROAIR HFA) 108 (90 BASE) MCG/ACT inhaler Inhale 1-2 puffs into the lungs daily as needed for wheezing or shortness of breath.    Marland Kitchen aspirin EC 81 MG tablet Take 1 tablet (81 mg total) by mouth daily.  90 tablet 3  . atorvastatin (LIPITOR) 80 MG tablet TAKE 1 TABLET BY MOUTH EVERYDAY AT BEDTIME 30 tablet 10  . Bempedoic Acid-Ezetimibe (NEXLIZET) 180-10 MG TABS Take 1 tablet by mouth daily. 30 tablet 11  . Cinnamon 500 MG capsule Take 1,000 mg by mouth daily. Take 2 Capsule Equal 1000 IU    . fluticasone (FLONASE) 50 MCG/ACT nasal spray Place 2 sprays into both nostrils at bedtime.    . Garlic (GARLIQUE PO) Take by mouth.    Javier Docker Oil 1000 MG CAPS Take by mouth.    . levocetirizine (XYZAL) 5 MG tablet Take 5 mg by mouth at bedtime as needed.    . metoprolol tartrate (LOPRESSOR) 50 MG tablet Take 50 mg by mouth 2 (two) times daily.    . nitroGLYCERIN (NITROSTAT) 0.4 MG SL tablet Place 1 tablet (0.4 mg total) under the tongue every 5 (five) minutes as needed for chest pain. (Patient not taking: Reported on 11/06/2019) 25 tablet 3   No current facility-administered medications for this encounter.    REVIEW OF SYSTEMS:  On review of systems, the patient reports that he is doing well overall. He denies any chest pain, shortness of breath, cough, fevers, chills, night sweats, unintended weight changes. He denies any bowel disturbances, and denies abdominal pain, nausea or vomiting. He denies any new musculoskeletal or joint aches or pains. His  IPSS was 1, indicating minimal urinary symptoms. He reports nocturia. His SHIM was 2 without sexual activity. A complete review of systems is obtained and is otherwise negative.    PHYSICAL EXAM:  Wt Readings from Last 3 Encounters:  11/06/19 155 lb (70.3 kg)  07/03/19 169 lb 12.8 oz (77 kg)  01/03/19 160 lb (72.6 kg)   Temp Readings from Last 3 Encounters:  12/20/18 98.3 F (36.8 C)  08/11/17 98.7 F (37.1 C) (Oral)  11/11/16 98.2 F (36.8 C) (Oral)   BP Readings from Last 3 Encounters:  07/03/19 132/80  02/05/19 (!) 162/78  07/06/18 138/72   Pulse Readings from Last 3 Encounters:  07/03/19 67  02/05/19 68  07/06/18 (!) 59   Pain Assessment Pain Score: 2  Pain Frequency: Intermittent Pain Loc: Ankle (left)/10  Physical exam not performed in light of telephone consult visit format.   KPS = 90  100 - Normal; no complaints; no evidence of disease. 90   - Able to carry on normal activity; minor signs or symptoms of disease. 80   - Normal activity with effort; some signs or symptoms of disease. 29   - Cares for self; unable to carry on normal activity or to do active work. 60   - Requires occasional assistance, but is able to care for most of his personal needs. 50   - Requires considerable assistance and frequent medical care. 28   - Disabled; requires special care and assistance. 97   - Severely disabled; hospital admission is indicated although death not imminent. 6   - Very sick; hospital admission necessary; active supportive treatment necessary. 10   - Moribund; fatal processes progressing rapidly. 0     - Dead  Karnofsky DA, Abelmann [place], Craver LS and Burchenal [facility] [phone]) The use of the nitrogen mustards in the palliative treatment of carcinoma: with particular reference to bronchogenic carcinoma Cancer 1 [id number]  LABORATORY DATA:  Lab Results  Component Value Date   WBC 12.8 (H) 08/11/2017   HGB 14.1 08/11/2017   HCT 42.6 08/11/2017   MCV 84.0 08/11/2017  PLT 251 08/11/2017   Lab Results  Component Value Date   NA 141 11/11/2016   K 4.3 11/11/2016   CL 111 11/11/2016   CO2 26 11/11/2016   Lab Results  Component Value Date   ALT 39 07/04/2019   AST 30 01/04/2019   ALKPHOS 85 01/04/2019   BILITOT 0.9 01/04/2019     RADIOGRAPHY: No results found.    IMPRESSION/PLAN: This visit was conducted via Telephone to spare the patient unnecessary potential exposure in the healthcare setting during the current COVID-19 pandemic. 1. 69 y.o. gentleman with Stage T1c adenocarcinoma of the prostate with Gleason Score of 4+3, and PSA of 10.6. We discussed the patient's workup and outlined the nature of prostate cancer in this setting. The patient's T stage, Gleason's score, and PSA put him into the unfavorable intermediate risk group. Accordingly, he is eligible for a variety of potential treatment options including prostatectomy or either brachytherapy or 5.5 weeks of external radiation with or without ADT. We discussed the role of ADT as well as the expected side effects associated with this therapy.  While this is certainly and option, given the fact that ADT carries unpleasant side effects that could negative impact his quality of life and is not proven to extend overall survival in the setting of intermediate risk disease, it is felt that the risks outweigh the benefits in his case. We discussed the available radiation techniques, and focused on the details and logistics and delivery. We discussed and outlined the risks, benefits, short and long-term effects associated with radiotherapy and compared and contrasted these with prostatectomy. We also discussed the role of SpaceOAR gel in reducing the rectal toxicity associated with radiotherapy. In light of the suggestion of ECE on MRI and PSA above 10, we did also recommend proceeding with CT and Bone scan imaging for disease staging to rule out any evidence of metastatic disease prior to proceeding with  treatment.   At the end of the conversation, the patient is in agreement with proceeding with CT and bone scan imaging for disease staging but remains undecided regarding his final treatment preference.  He appears to be leaning towards brachytherapy but would like to take some additional time to consider his options and discuss this with his wife and children prior to committing to a treatment. With Dr. Simone Curia retirement at the end of this month, his care will be transferred to one of his colleagues at [facility]. We will share our discussion with Dr. Karsten Ro and the recommendation to procced with disease staging imaging prior to treatment.  We enjoyed meeting the patient today and look forward to hearing from him in the near future regarding his treatment preference.  Given current concerns for patient exposure during the COVID-19 pandemic, this encounter was conducted via telephone. The patient was notified in advance and was offered a MyChart meeting to allow for face to face communication but unfortunately reported that he did not have the appropriate resources/technology to support such a visit and instead preferred to proceed with telephone consult. The patient has given verbal consent for this type of encounter. The time spent during this encounter was 60 minutes. The attendants for this meeting include Tyler Pita MD, Ashlyn Bruning PA-C, Quentin, and patient Melvin Dunn. During the encounter, Tyler Pita MD, Ashlyn Bruning PA-C, and scribe, Wilburn Mylar were located at [place].  Patient Melvin Dunn was located at home.    Nicholos Johns,  PA-C    Tyler Pita, MD  [facility] Direct Dial: [phone]  Fax: [phone] [email or web address]  Skype  [email or web address]  This document serves as a record of services personally performed by Tyler Pita, MD and Freeman Caldron, PA-C. It  was created on their behalf by Wilburn Mylar, a trained medical scribe. The creation of this record is based on the scribe's personal observations and the provider's statements to them. This document has been checked and approved by the attending provider.

## 2019-11-06 NOTE — Progress Notes (Signed)
See progress note under physician encounter. 

## 2019-11-13 ENCOUNTER — Other Ambulatory Visit (HOSPITAL_COMMUNITY): Payer: Self-pay | Admitting: Urology

## 2019-11-13 ENCOUNTER — Telehealth: Payer: Self-pay

## 2019-11-13 DIAGNOSIS — C61 Malignant neoplasm of prostate: Secondary | ICD-10-CM

## 2019-11-13 DIAGNOSIS — E7800 Pure hypercholesterolemia, unspecified: Secondary | ICD-10-CM

## 2019-11-13 NOTE — Telephone Encounter (Signed)
Called and lmomed the pt to schedule labs, orders placed, will await callback.

## 2019-11-13 NOTE — Telephone Encounter (Signed)
-----   Message from Ramond Dial, [place] sent at 11/13/2019  7:09 AM EDT -----  ----- Message ----- From: Ramond Dial, RPH-CPP Sent: 11/12/2019 To: Ramond Dial, RPH-CPP  Set up lipid labs- started nexlizet

## 2019-11-15 ENCOUNTER — Encounter: Payer: Self-pay | Admitting: Medical Oncology

## 2019-11-15 NOTE — Progress Notes (Signed)
Left message to introduce myself as the prostate nurse navigator and my role. I was unable to meet him 6/22, when he consulted with Dr. Tammi Klippel and Ailene Ards, [place]. He is scheduled for CT and bone scan 7/14. I provided my contact information and asked him to call me with questions or concerns.

## 2019-11-24 ENCOUNTER — Other Ambulatory Visit: Payer: Self-pay | Admitting: Cardiology

## 2019-11-28 ENCOUNTER — Encounter (HOSPITAL_COMMUNITY)
Admission: RE | Admit: 2019-11-28 | Discharge: 2019-11-28 | Disposition: A | Discharge status: Home / Self Care | Payer: Medicare Other | Source: Ambulatory Visit | Attending: Urology | Admitting: Urology

## 2019-11-28 ENCOUNTER — Other Ambulatory Visit: Payer: Self-pay

## 2019-11-28 DIAGNOSIS — C61 Malignant neoplasm of prostate: Secondary | ICD-10-CM | POA: Diagnosis not present

## 2019-11-28 MED ORDER — TECHNETIUM TC 99M MEDRONATE IV KIT
20.0000 | PACK | Freq: Once | INTRAVENOUS | Status: AC | PRN
  Administered 2019-11-28: 21.2 via INTRAVENOUS

## 2019-11-29 ENCOUNTER — Encounter: Payer: Self-pay | Admitting: Medical Oncology

## 2019-11-29 NOTE — Progress Notes (Signed)
Left message requesting a return call to discuss scan results and treatment decision.

## 2019-12-04 ENCOUNTER — Telehealth: Payer: Self-pay

## 2019-12-04 NOTE — Telephone Encounter (Signed)
Called and lmomed for lipid labs

## 2019-12-06 ENCOUNTER — Encounter: Payer: Self-pay | Admitting: Medical Oncology

## 2019-12-06 ENCOUNTER — Telehealth: Payer: Self-pay | Admitting: *Deleted

## 2019-12-06 NOTE — Telephone Encounter (Signed)
CALLED PATIENT TO ASK QUESTIONS, SPOKE WITH PATIENT 

## 2019-12-06 NOTE — Progress Notes (Signed)
Spoke with patient to introduce myself and discuss my role. I have not been able to speka with him but have left several message. He had CT and bone scan to complete staging, and they are negative. After research and thought, he would like to move forward with brachytherapy. I explained the process and informed him Enid Derry will be in contact with him to get appointments coordinated. He voiced understanding. I encouraged him to call me with questions or concerns.

## 2019-12-12 ENCOUNTER — Telehealth: Payer: Self-pay | Admitting: *Deleted

## 2019-12-12 NOTE — Telephone Encounter (Signed)
Called patient to update, lvm for a return call 

## 2019-12-13 ENCOUNTER — Telehealth: Payer: Self-pay | Admitting: *Deleted

## 2019-12-13 ENCOUNTER — Other Ambulatory Visit: Payer: Self-pay

## 2019-12-13 ENCOUNTER — Other Ambulatory Visit: Payer: Medicare Other | Admitting: *Deleted

## 2019-12-13 DIAGNOSIS — E7800 Pure hypercholesterolemia, unspecified: Secondary | ICD-10-CM

## 2019-12-13 LAB — LIPID PANEL
Chol/HDL Ratio: 4.1 ratio (ref 0.0–5.0)
Cholesterol, Total: 119 mg/dL (ref 100–199)
HDL: 29 mg/dL — ABNORMAL LOW (ref >39)
LDL Chol Calc (NIH): 54 mg/dL (ref 0–99)
Triglycerides: 219 mg/dL — ABNORMAL HIGH (ref 0–149)
VLDL Cholesterol Cal: 36 mg/dL (ref 5–40)

## 2019-12-13 NOTE — Telephone Encounter (Signed)
CALLED PATIENT TO UPDATE, SPOKE WITH PATIENT 

## 2019-12-19 ENCOUNTER — Other Ambulatory Visit: Payer: Self-pay

## 2019-12-19 ENCOUNTER — Ambulatory Visit (INDEPENDENT_AMBULATORY_CARE_PROVIDER_SITE_OTHER): Payer: Medicare Other | Admitting: Pharmacist

## 2019-12-19 DIAGNOSIS — E119 Type 2 diabetes mellitus without complications: Secondary | ICD-10-CM | POA: Diagnosis not present

## 2019-12-19 DIAGNOSIS — E7800 Pure hypercholesterolemia, unspecified: Secondary | ICD-10-CM | POA: Diagnosis not present

## 2019-12-19 NOTE — Patient Instructions (Addendum)
Please stop drinking grape juice and potato chips. Try to limit carbs/sweets in the diet.  Recheck labs on 03/12/20    Hyperglycemia Hyperglycemia occurs when the level of sugar (glucose) in the blood is too high. Glucose is a type of sugar that provides the body's main source of energy. Certain hormones (insulin and glucagon) control the level of glucose in the blood. Insulin lowers blood glucose, and glucagon increases blood glucose. Hyperglycemia can result from having too little insulin in the bloodstream, or from the body not responding normally to insulin. Hyperglycemia occurs most often in people who have diabetes (diabetes mellitus), but it can happen in people who do not have diabetes. It can develop quickly, and it can be life-threatening if it causes you to become severely dehydrated (diabetic ketoacidosis or hyperglycemic hyperosmolar state). Severe hyperglycemia is a medical emergency. What are the causes? If you have diabetes, hyperglycemia may be caused by:  Diabetes medicine.  Medicines that increase blood glucose or affect your diabetes control.  Not eating enough, or not eating often enough.  Changes in physical activity level.  Being sick or having an infection. If you have prediabetes or undiagnosed diabetes:  Hyperglycemia may be caused by those conditions. If you do not have diabetes, hyperglycemia may be caused by:  Certain medicines, including steroid medicines, beta-blockers, epinephrine, and thiazide diuretics.  Stress.  Serious illness.  Surgery.  Diseases of the pancreas.  Infection. What increases the risk? Hyperglycemia is more likely to develop in people who have risk factors for diabetes, such as:  Having a family member with diabetes.  Having a gene for type 1 diabetes that is passed from parent to child (inherited).  Living in an area with cold weather conditions.  Exposure to certain viruses.  Certain conditions in which the body's  disease-fighting (immune) system attacks itself (autoimmune disorders).  Being overweight or obese.  Having an inactive (sedentary) lifestyle.  Having been diagnosed with insulin resistance.  Having a history of prediabetes, gestational diabetes, or polycystic ovarian syndrome (PCOS).  Being of American-Indian, African-American, Hispanic/Latino, or Asian/Pacific Islander descent. What are the signs or symptoms? Hyperglycemia may not cause any symptoms. If you do have symptoms, they may include early warning signs, such as:  Increased thirst.  Hunger.  Feeling very tired.  Needing to urinate more often than usual.  Blurry vision. Other symptoms may develop if hyperglycemia gets worse, such as:  Dry mouth.  Loss of appetite.  Fruity-smelling breath.  Weakness.  Unexpected or rapid weight gain or weight loss.  Tingling or numbness in the hands or feet.  Headache.  Skin that does not quickly return to normal after being lightly pinched and released (poor skin turgor).  Abdominal pain.  Cuts or bruises that are slow to heal. How is this diagnosed? Hyperglycemia is diagnosed with a blood test to measure your blood glucose level. This blood test is usually done while you are having symptoms. Your health care provider may also do a physical exam and review your medical history. You may have more tests to determine the cause of your hyperglycemia, such as:  A fasting blood glucose (FBG) test. You will not be allowed to eat (you will fast) for at least 8 hours before a blood sample is taken.  An A1c (hemoglobin A1c) blood test. This provides information about blood glucose control over the previous 2-3 months.  An oral glucose tolerance test (OGTT). This measures your blood glucose at two times: ? After fasting. This is your  baseline blood glucose level. ? Two hours after drinking a beverage that contains glucose. How is this treated? Treatment depends on the cause of  your hyperglycemia. Treatment may include:  Taking medicine to regulate your blood glucose levels. If you take insulin or other diabetes medicines, your medicine or dosage may be adjusted.  Lifestyle changes, such as exercising more, eating healthier foods, or losing weight.  Treating an illness or infection, if this caused your hyperglycemia.  Checking your blood glucose more often.  Stopping or reducing steroid medicines, if these caused your hyperglycemia. If your hyperglycemia becomes severe and it results in hyperglycemic hyperosmolar state, you must be hospitalized and given IV fluids. Follow these instructions at home:  General instructions  Take over-the-counter and prescription medicines only as told by your health care provider.  Do not use any products that contain nicotine or tobacco, such as cigarettes and e-cigarettes. If you need help quitting, ask your health care provider.  Limit alcohol intake to no more than 1 drink per day for nonpregnant women and 2 drinks per day for men. One drink equals 12 oz of beer, 5 oz of wine, or 1 oz of hard liquor.  Learn to manage stress. If you need help with this, ask your health care provider.  Keep all follow-up visits as told by your health care provider. This is important. Eating and drinking   Maintain a healthy weight.  Exercise regularly, as directed by your health care provider.  Stay hydrated, especially when you exercise, get sick, or spend time in hot temperatures.  Eat healthy foods, such as: ? Lean proteins. ? Complex carbohydrates. ? Fresh fruits and vegetables. ? Low-fat dairy products. ? Healthy fats.  Drink enough fluid to keep your urine clear or pale yellow. If you have diabetes:  Make sure you know the symptoms of hyperglycemia.  Follow your diabetes management plan, as told by your health care provider. Make sure you: ? Take your insulin and medicines as directed. ? Follow your exercise  plan. ? Follow your meal plan. Eat on time, and do not skip meals. ? Check your blood glucose as often as directed. Make sure to check your blood glucose before and after exercise. If you exercise longer or in a different way than usual, check your blood glucose more often. ? Follow your sick day plan whenever you cannot eat or drink normally. Make this plan in advance with your health care provider.  Share your diabetes management plan with people in your workplace, school, and household.  Check your urine for ketones when you are ill and as told by your health care provider.  Carry a medical alert card or wear medical alert jewelry. Contact a health care provider if:  Your blood glucose is at or above 240 mg/dL (13.3 mmol/L) for 2 days in a row.  You have problems keeping your blood glucose in your target range.  You have frequent episodes of hyperglycemia. Get help right away if:  You have difficulty breathing.  You have a change in how you think, feel, or act (mental status).  You have nausea or vomiting that does not go away. These symptoms may represent a serious problem that is an emergency. Do not wait to see if the symptoms will go away. Get medical help right away. Call your local emergency services (911 in the [place] Do not drive yourself to the hospital. Summary  Hyperglycemia occurs when the level of sugar (glucose) in the blood is too high.  Hyperglycemia is diagnosed with a blood test to measure your blood glucose level. This blood test is usually done while you are having symptoms. Your health care provider may also do a physical exam and review your medical history.  If you have diabetes, follow your diabetes management plan as told by your health care provider.  Contact your health care provider if you have problems keeping your blood glucose in your target range. This information is not intended to replace advice given to you by your health care provider. Make  sure you discuss any questions you have with your health care provider. Document Revised: 01/19/2016 Document Reviewed: 01/19/2016 Elsevier Patient Education  [place].

## 2019-12-19 NOTE — Progress Notes (Signed)
Patient ID: CASS VANDERMEULEN                 DOB: 1951/02/12                    MRN: [id number]     HPI: ADAIN GEURIN is a 69 y.o. male patient referred to lipid clinic by Dr. Radford Pax. PMH is significant for aortic stenosis, diabetes, hyperlipidemia, and CAD s/p CABG in 2015.   Patient was seen previously in lipid clinic (10/25/2019) by myself in lipid clinic. He was started on Nexlizet. LDL has come down nicely to 54. However, patients TG are back up again.   Patient presents to lipid clinic today to discuss. Patient denies any changes in diet. His A1C in march was 6.5. He is not on any diabetes medications. He does not qualify for healthwell grant. Is paying 100 out of pocket for Nezlizet.  He is drinking grape juice every morning because someone told him it would prevent something. He doesn't remember what.   Current Medications: atorvastatin 80 mg daily, Nexlizet 180-10mg  daily Intolerances: None   Risk Factors: CAD, HLD, aortic stenosis, diabetes  LDL goal: <70 mg/dL,   Diet: 2 blueberry waffles with peanut butter every morning, chicken salad or pimento cheese sandwich on wheat bread with chips for lunch, dinner varies - chicken, some red meat, not as much fish as he used to eat;  2-3 times per week, occasional fruit, cole slaw, pinto bean Drinks diet sodas Grape juice  Exercise: still employed - job requires lots of walking (1.5-2 miles per day)  Family History: Alzheimer's disease and stroke in his mother; lung cancer in his father; Heart attack in his maternal grandfather  Social History: Never smoker, no alcohol use in the last 20 years  Labs: 12/13/19 TC 119 TG 219 VLDL 36 HDL 29 LDL 54 (atorvastatin 80mg , Nexlizet 180-10mg  daily) 07/04/19 TC 139 TG 149 VLDL 26 HDL 33 LDL 80 (atorvastatin 80 mg daily, Zetia 10 mg daily) 01/04/19: TC 157, TG 182, HDL 36, LDL 85 (atorvastatin 80 mg, Zetia 10 mg) 07/13/18: TC 136, TG 137, HDL 35, LDL 74 (atorvastatin 80 mg, Zetia 10 mg) 06/17/17: TC 123,  TG 96, HDL 32, LDL 72 (atorvastatin 80 mg, Zetia 10 mg)  Past Medical History:  Diagnosis Date  . Adenocarcinoma ([place])    of the prostate, 5% of single core 01/2011-watching and waiting for now  . Aortic stenosis 02/11/2014   moderate by echo 06/2019  with Aortic valve mean gradient measures 28.5 mmHg. Aortic valve Vmax measures 3.64 m/s.  Marland Kitchen Asthma   . CAD (coronary artery disease), native coronary artery    LIMA to LAD, SVG to OM1, SVG to RCA-RPL, EVH via right thigh and leg  . Colon polyp   . Environmental allergies   . Hyperlipidemia   . Kidney stone   . Prostate cancer ([place])   . Type II or unspecified type diabetes mellitus without mention of complication, not stated as uncontrolled, borderline     Current Outpatient Medications on File Prior to Visit  Medication Sig Dispense Refill  . albuterol (PROAIR HFA) 108 (90 BASE) MCG/ACT inhaler Inhale 1-2 puffs into the lungs daily as needed for wheezing or shortness of breath.    Marland Kitchen aspirin EC 81 MG tablet Take 1 tablet (81 mg total) by mouth daily. 90 tablet 3  . atorvastatin (LIPITOR) 80 MG tablet TAKE 1 TABLET BY MOUTH EVERYDAY AT BEDTIME 90 tablet 1  .  Bempedoic Acid-Ezetimibe (NEXLIZET) 180-10 MG TABS Take 1 tablet by mouth daily. 30 tablet 11  . Cinnamon 500 MG capsule Take 1,000 mg by mouth daily. Take 2 Capsule Equal 1000 IU    . fluticasone (FLONASE) 50 MCG/ACT nasal spray Place 2 sprays into both nostrils at bedtime.    . Garlic (GARLIQUE PO) Take by mouth.    Javier Docker Oil 1000 MG CAPS Take by mouth.    . levocetirizine (XYZAL) 5 MG tablet Take 5 mg by mouth at bedtime as needed.    . metoprolol tartrate (LOPRESSOR) 50 MG tablet Take 50 mg by mouth 2 (two) times daily.    . nitroGLYCERIN (NITROSTAT) 0.4 MG SL tablet Place 1 tablet (0.4 mg total) under the tongue every 5 (five) minutes as needed for chest pain. (Patient not taking: Reported on 11/06/2019) 25 tablet 3   No current facility-administered medications on file prior to  visit.    No Known Allergies  Assessment/Plan:  1. Hyperlipidemia - Patient's LDL is at goal < 70 mg/dL. TG are elevated above goal of <150. Vascepa would be cost prohibitive. Fenofibrate is an option. But would rather first work on diet. I have identified two things that would help lower patients carb intake- 1. Stop eating potato chips with lunch 2. Stop drinking grape juice. Patient will work on diet and recheck in 3 months. Will check an A1C today to make sure that it has not gone up (although I suspect with the increase in TG it has). Follow up labs scheduled for October 27.   Thank you for involving pharmacy to assist in providing this patient's care.   Ramond Dial, Pharm.D, BCPS, CPP [facility]  [address], [place], [place] [address]  Phone: [phone]; Fax: [phone]

## 2019-12-20 LAB — HEMOGLOBIN A1C
Est. average glucose Bld gHb Est-mCnc: 134 mg/dL
Hgb A1c MFr Bld: 6.3 % — ABNORMAL HIGH (ref 4.8–5.6)

## 2020-01-03 ENCOUNTER — Encounter: Payer: Self-pay | Admitting: Medical Oncology

## 2020-01-03 NOTE — Progress Notes (Signed)
Left message asking for a return call regarding his treatment decision. I asked him to call if he has further questions or concerns that need addressing before making his final decision.

## 2020-01-23 ENCOUNTER — Telehealth: Payer: Self-pay | Admitting: *Deleted

## 2020-01-23 NOTE — Telephone Encounter (Signed)
CALLED PATIENT TO REMIND OF PRE-SEED APPTS. FOR 01-24-20, LVM FOR A RETURN CALL

## 2020-01-24 ENCOUNTER — Encounter: Payer: Self-pay | Admitting: Medical Oncology

## 2020-01-24 ENCOUNTER — Ambulatory Visit: Payer: Medicare Other | Admitting: Urology

## 2020-01-24 ENCOUNTER — Ambulatory Visit: Payer: Medicare Other | Admitting: Radiation Oncology

## 2020-01-28 ENCOUNTER — Encounter: Payer: Self-pay | Admitting: Medical Oncology

## 2020-02-05 ENCOUNTER — Other Ambulatory Visit: Payer: Self-pay | Admitting: Cardiology

## 2020-02-06 ENCOUNTER — Telehealth: Payer: Self-pay | Admitting: Cardiology

## 2020-02-06 ENCOUNTER — Other Ambulatory Visit: Payer: Self-pay | Admitting: Urology

## 2020-02-06 NOTE — Telephone Encounter (Signed)
Pt is agreeable to pre op appt for clearance. Pt has been scheduled to see Robbie Lis, [facility] 02/08/20 @ 2:45. I will forward clearance notes to PA for upcoming appt. Will remove from the pre op call back pool.

## 2020-02-06 NOTE — Telephone Encounter (Signed)
   Primary Cardiologist: Fransico Him, MD  Chart reviewed as part of pre-operative protocol coverage. Because of Ayodeji R Golberg's past medical history and time since last visit, they will require a follow-up visit in order to better assess preoperative cardiovascular risk.  Pre-op covering staff: - Please schedule appointment and call patient to inform them. If patient already had an upcoming appointment within acceptable timeframe, please add "pre-op clearance" to the appointment notes so provider is aware. - Please contact requesting surgeon's office via preferred method (i.e, phone, fax) to inform them of need for appointment prior to surgery.   [place], [place]  02/06/2020, 1:56 PM

## 2020-02-06 NOTE — Telephone Encounter (Signed)
   [facility] Pre-operative Risk Assessment    HEARTCARE STAFF: - Please ensure there is not already an duplicate clearance open for this procedure. - Under Visit Info/Reason for Call, type in Other and utilize the format Clearance MM/DD/YY or Clearance TBD. Do not use dashes or single digits. - If request is for dental extraction, please clarify the # of teeth to be extracted.  Request for surgical clearance:  1. What type of surgery is being performed? Cystolithalopaxy, Space OAR insertion, fiducial marker placement  2. When is this surgery scheduled? 03/17/2020   3. What type of clearance is required (medical clearance vs. Pharmacy clearance to hold med vs. Both)? Both  4. Are there any medications that need to be held prior to surgery and how long? Aspirin for 5 days prior  5. Practice name and name of physician performing surgery? [facility]; Dr. Wynetta Emery Dordon  6. What is the office phone number? [phone] Ext. [phone]   7.   What is the office fax number? [phone]  8.   Anesthesia type (None, local, MAC, general) ? General   Phillip Pough 02/06/2020, 12:07 PM  _________________________________________________________________   (provider comments below)

## 2020-02-07 ENCOUNTER — Other Ambulatory Visit: Payer: Self-pay | Admitting: Urology

## 2020-02-07 ENCOUNTER — Telehealth: Payer: Self-pay | Admitting: *Deleted

## 2020-02-07 MED ORDER — FLEET ENEMA 7-19 GM/118ML RE ENEM: 1.0000 | ENEMA | Freq: Once | RECTAL | Status: AC

## 2020-02-07 NOTE — Telephone Encounter (Signed)
Called pt regarding appointment with Robbie Lis, PA-C, 9/24.  If pt calls back, please get the call to [employer]

## 2020-02-07 NOTE — Telephone Encounter (Signed)
Spoke to pt and have moved his appointment time.

## 2020-02-07 NOTE — Progress Notes (Signed)
Cardiology Office Note    Date:  02/08/2020   ID:  Melvin Dunn, DOB 04-Oct-1950, MRN [id number]  PCP:  Shirline Frees, MD  Cardiologist:  Dr. Radford Pax  Chief Complaint: Surgical clearance for Cystolithalopaxy, Space OAR insertion, fiducial marker placement  History of Present Illness:   Melvin Dunn is a 69 y.o. male with hx of CAD s/p CABG, moderate AS, HLD and DM seen for surgical clearance.   Hx of CAD with cath showing critical left main stenosis and multivessel ASCAD with normal LVF in 2015 s/p CABG with LIMA to LAD, SVG to OM, SVG to distal RCA/PL.  Last echo 06/2012 showed LVEF of 55-60% and grade 1 DD. Moderate aortic stenosis is present. LVOT VTI signal is poorly  quantitated, making AVA by VTI low. By Vmax and mean gradient, moderate AS  is present. This is stable compared with study 12/12/2018. Aorti  regurgitation remains mild. The aortic valve is tricuspid. Aortic valve regurgitation is mild. Moderate aortic valve stenosis. Aortic valve mean gradient measures 28.5 mmHg. Aortic valve Vmax measures 3.64 m/s.   Plan to repeat echo 06/2020.  Here for surgical clearance.  No complaints.  No regular exercise but active at work.  He just to make field trips.  He walks uphill and goes up and down off a stair without any issue.  Denies chest pain, shortness of breath, orthopnea, palpitations PND, syncope, lower extremity edema or dizziness.   Past Medical History:  Diagnosis Date  . Adenocarcinoma ([place])    of the prostate, 5% of single core 01/2011-watching and waiting for now  . Aortic stenosis 02/11/2014   moderate by echo 06/2019  with Aortic valve mean gradient measures 28.5 mmHg. Aortic valve Vmax measures 3.64 m/s.  Marland Kitchen Asthma   . CAD (coronary artery disease), native coronary artery    LIMA to LAD, SVG to OM1, SVG to RCA-RPL, EVH via right thigh and leg  . Colon polyp   . Environmental allergies   . Hyperlipidemia   . Kidney stone   . Prostate cancer ([place])   . Type II or  unspecified type diabetes mellitus without mention of complication, not stated as uncontrolled, borderline     Past Surgical History:  Procedure Laterality Date  . CORONARY ARTERY BYPASS GRAFT N/A 02/13/2014   Procedure: CORONARY ARTERY BYPASS GRAFTING (CABG) TIMES FOUR USING LEFT INTERNAL MAMMARY ARTERY AND RIGHT SAPHENOUS LEG VEIN HARVESTED ENDOSCOPICALLY;  Surgeon: Rexene Alberts, MD;  Location: [facility];  Service: Open Heart Surgery;  Laterality: N/A;  . INTRAOPERATIVE TRANSESOPHAGEAL ECHOCARDIOGRAM N/A 02/13/2014   Procedure: INTRAOPERATIVE TRANSESOPHAGEAL ECHOCARDIOGRAM;  Surgeon: Rexene Alberts, MD;  Location: [facility];  Service: Open Heart Surgery;  Laterality: N/A;  . LEFT HEART CATHETERIZATION WITH CORONARY ANGIOGRAM N/A 02/12/2014   Procedure: LEFT HEART CATHETERIZATION WITH CORONARY ANGIOGRAM;  Surgeon: Blane Ohara, MD;  Location: [facility] C[facility];  Service: Cardiovascular;  Laterality: N/A;    Current Medications: Prior to Admission medications   Medication Sig Start Date End Date Taking? Authorizing Provider  albuterol (PROAIR HFA) 108 (90 BASE) MCG/ACT inhaler Inhale 1-2 puffs into the lungs daily as needed for wheezing or shortness of breath.    [provider]  aspirin EC 81 MG tablet Take 1 tablet (81 mg total) by mouth daily. 06/10/17   Sueanne Margarita, MD  atorvastatin (LIPITOR) 80 MG tablet TAKE 1 TABLET BY MOUTH EVERYDAY AT BEDTIME 11/26/19   Sueanne Margarita, MD  Bempedoic Acid-Ezetimibe (NEXLIZET) 180-10 MG  TABS Take 1 tablet by mouth daily. 09/03/19   Sueanne Margarita, MD  Cinnamon 500 MG capsule Take 1,000 mg by mouth daily. Take 2 Capsule Equal 1000 IU    [provider]  fluticasone (FLONASE) 50 MCG/ACT nasal spray Place 2 sprays into both nostrils at bedtime.    [provider]  Garlic (GARLIQUE PO) Take by mouth.    [provider]  Javier Docker Oil 1000 MG CAPS Take by mouth.    [provider]  levocetirizine (XYZAL) 5 MG tablet Take 5  mg by mouth at bedtime as needed. 06/12/19   [provider]  metoprolol tartrate (LOPRESSOR) 50 MG tablet Take 50 mg by mouth 2 (two) times daily. 05/20/17   [provider]  nitroGLYCERIN (NITROSTAT) 0.4 MG SL tablet Place 1 tablet (0.4 mg total) under the tongue every 5 (five) minutes as needed for chest pain. Patient not taking: Reported on 11/06/2019 01/24/15   Sueanne Margarita, MD    Allergies:   Patient has no known allergies.   Social History   Socioeconomic History  . Marital status: Single    Spouse name: Not on file  . Number of children: Not on file  . Years of education: Not on file  . Highest education level: Not on file  Occupational History    Comment: full time  Tobacco Use  . Smoking status: Never Smoker  . Smokeless tobacco: Never Used  Vaping Use  . Vaping Use: Never used  Substance and Sexual Activity  . Alcohol use: No  . Drug use: No  . Sexual activity: Yes  Other Topics Concern  . Not on file  Social History Narrative  . Not on file   Social Determinants of Health   Financial Resource Strain:   . Difficulty of Paying Living Expenses: Not on file  Food Insecurity:   . Worried About Charity fundraiser in the Last Year: Not on file  . Ran Out of Food in the Last Year: Not on file  Transportation Needs:   . Lack of Transportation (Medical): Not on file  . Lack of Transportation (Non-Medical): Not on file  Physical Activity:   . Days of Exercise per Week: Not on file  . Minutes of Exercise per Session: Not on file  Stress:   . Feeling of Stress : Not on file  Social Connections:   . Frequency of Communication with Friends and Family: Not on file  . Frequency of Social Gatherings with Friends and Family: Not on file  . Attends Religious Services: Not on file  . Active Member of Clubs or Organizations: Not on file  . Attends Archivist Meetings: Not on file  . Marital Status: Not on file     Family History:  The patient's  family history includes Alzheimer's disease in his mother; Cancer in his father; Cancer - Lung in his father; Heart attack in his maternal grandfather; Skin cancer in his mother; Stroke in his mother.   ROS:   Please see the history of present illness.    ROS All other systems reviewed and are negative.   PHYSICAL EXAM:   VS:  BP (!) 148/80   Pulse 70   Ht 5\' 6"  (1.676 m)   Wt 164 lb 12.8 oz (74.8 kg)   SpO2 97%   BMI 26.60 kg/m    GEN: Well nourished, well developed, in no acute distress  HEENT: normal  Neck: no JVD, carotid bruits, or  masses Cardiac: LZJ;6/7 systolic  murmurs, rubs, or gallops,no edema  Respiratory:  clear to auscultation bilaterally, normal work of breathing GI: soft, nontender, nondistended, + BS MS: no deformity or atrophy  Skin: warm and dry, no rash Neuro:  Alert and Oriented x 3, Strength and sensation are intact Psych: euthymic mood, full affect  Wt Readings from Last 3 Encounters:  02/08/20 164 lb 12.8 oz (74.8 kg)  11/06/19 155 lb (70.3 kg)  07/03/19 169 lb 12.8 oz (77 kg)      Studies/Labs Reviewed:   EKG:  EKG is ordered today.  The ekg ordered today demonstrates normal sinus rhythm at rate of 61 bpm  Recent Labs: 07/04/2019: ALT 39   Lipid Panel    Component Value Date/Time   CHOL 119 12/13/2019 0736   TRIG 219 (H) 12/13/2019 0736   HDL 29 (L) 12/13/2019 0736   CHOLHDL 4.1 12/13/2019 0736   CHOLHDL 3.6 05/03/2016 0909   VLDL 18 05/03/2016 0909   LDLCALC 54 12/13/2019 0736    Additional studies/ records that were reviewed today include:   Echocardiogram: 06/2019 1. Left ventricular ejection fraction, by estimation, is 55 to 60%. Left  ventricular ejection fraction by 3D volume is 58 %. The left ventricle has  normal function. The left ventricle has no regional wall motion  abnormalities. Left ventricular diastolic  parameters are consistent with Grade I diastolic dysfunction (impaired  relaxation).  2. Right ventricular  systolic function is normal. The right ventricular  size is normal. There is normal pulmonary artery systolic pressure. The  estimated right ventricular systolic pressure is 34.1 mmHg.  3. The mitral valve is grossly normal. Trivial mitral valve  regurgitation. No evidence of mitral stenosis.  4. Moderate aortic stenosis is present. LVOT VTI signal is poorly  quantitated, making AVA by VTI low. By Vmax and mean gradient, moderate AS  is present. This is stable compared with study 12/12/2018. Aorti  regurgitation remains mild. The aortic valve is  tricuspid. Aortic valve regurgitation is mild. Moderate aortic valve  stenosis. Aortic valve mean gradient measures 28.5 mmHg. Aortic valve Vmax  measures 3.64 m/s.  5. The inferior vena cava is normal in size with greater than 50%  respiratory variability, suggesting right atrial pressure of 3 mmHg.   Comparison(s): A prior study was performed on 12/12/2018. No significant  change from prior study. Prior images reviewed side by side. Vmax and  gradients stable. Aortic stenosis remains moderate.    ASSESSMENT & PLAN:    1. CAD s/p CABG No angina.  Continue aspirin, statin and beta-blocker.  2. Moderate AS/Mild AI - Stable gradient by last echo 06/2019.  -  No syncope   3. HLD with hypertryglyceridemia - Followed by lipid clinic.   4.  Hypertension -Blood pressure elevated.  I have advised him to keep log of his blood pressure.  If persistently systolic blood pressure above 140, he will increase Lopressor to 75 mg twice daily and let us know reading.  5.  Surgical clearance -Patient is easily getting greater than 4 METS of activity.  Given past medical history and time since last visit, based on ACC/AHA guidelines, Melvin Dunn would be at acceptable risk for the planned procedure without further cardiovascular testing. He can hold ASA 5-7 days prior to surgery.   The patient was advised that if he develops new symptoms prior to surgery  to contact our office to arrange for a follow-up visit, and he verbalized understanding.  I will route  this recommendation to the requesting party via [facility] fax function and remove from pre-op pool.  Please call with questions.  [place], [place] 02/08/2020, 10:44 AM    Medication Adjustments/Labs and Tests Ordered: Current medicines are reviewed at length with the patient today.  Concerns regarding medicines are outlined above.  Medication changes, Labs and Tests ordered today are listed in the Patient Instructions below. Patient Instructions  Medication Instructions:  Your physician recommends that you continue on your current medications as directed. Please refer to the Current Medication list given to you today.  *If you need a refill on your cardiac medications before your next appointment, please call your pharmacy*   Lab Work: None ordered  If you have labs (blood work) drawn today and your tests are completely normal, you will receive your results only by: Marland Kitchen MyChart Message (if you have MyChart) OR . A paper copy in the mail If you have any lab test that is abnormal or we need to change your treatment, we will call you to review the results.   Testing/Procedures: None ordered   Follow-Up: At [facility], you and your health needs are our priority.  As part of our continuing mission to provide you with exceptional heart care, we have created designated Provider Care Teams.  These Care Teams include your primary Cardiologist (physician) and Advanced Practice Providers (APPs -  Physician Assistants and Nurse Practitioners) who all work together to provide you with the care you need, when you need it.  We recommend signing up for the patient portal called "MyChart".  Sign up information is provided on this After Visit Summary.  MyChart is used to connect with patients for Virtual Visits (Telemedicine).  Patients are able to view lab/test results, encounter notes,  upcoming appointments, etc.  Non-urgent messages can be sent to your provider as well.   To learn more about what you can do with MyChart, go to [email or web address].    Your next appointment:   6 month(s)  The format for your next appointment:   In Person  Provider:   You may see Fransico Him, MD or one of the following Advanced Practice Providers on your designated Care Team:    Melina Copa, PA-C  Ermalinda Barrios, PA-C    Other Instructions      Signed, Leanor K[place], [place]  02/08/2020 10:42 AM    [address], [place], [place]  [address] Phone: [phone]; Fax: [phone]

## 2020-02-08 ENCOUNTER — Other Ambulatory Visit: Payer: Self-pay | Admitting: Urology

## 2020-02-08 ENCOUNTER — Encounter: Payer: Self-pay | Admitting: Physician Assistant

## 2020-02-08 ENCOUNTER — Other Ambulatory Visit: Payer: Self-pay

## 2020-02-08 ENCOUNTER — Ambulatory Visit: Payer: Medicare Other | Admitting: Physician Assistant

## 2020-02-08 VITALS — BP 148/80 | HR 70 | Ht 66.0 in | Wt 164.8 lb

## 2020-02-08 DIAGNOSIS — Z01810 Encounter for preprocedural cardiovascular examination: Secondary | ICD-10-CM

## 2020-02-08 DIAGNOSIS — I350 Nonrheumatic aortic (valve) stenosis: Secondary | ICD-10-CM | POA: Diagnosis not present

## 2020-02-08 DIAGNOSIS — I2510 Atherosclerotic heart disease of native coronary artery without angina pectoris: Secondary | ICD-10-CM | POA: Diagnosis not present

## 2020-02-08 DIAGNOSIS — E785 Hyperlipidemia, unspecified: Secondary | ICD-10-CM | POA: Diagnosis not present

## 2020-02-08 DIAGNOSIS — I10 Essential (primary) hypertension: Secondary | ICD-10-CM

## 2020-02-08 DIAGNOSIS — C61 Malignant neoplasm of prostate: Secondary | ICD-10-CM

## 2020-02-08 DIAGNOSIS — E7800 Pure hypercholesterolemia, unspecified: Secondary | ICD-10-CM | POA: Diagnosis not present

## 2020-02-08 NOTE — Patient Instructions (Signed)
Medication Instructions:  Your physician recommends that you continue on your current medications as directed. Please refer to the Current Medication list given to you today.  *If you need a refill on your cardiac medications before your next appointment, please call your pharmacy*   Lab Work: None ordered  If you have labs (blood work) drawn today and your tests are completely normal, you will receive your results only by: . MyChart Message (if you have MyChart) OR . A paper copy in the mail If you have any lab test that is abnormal or we need to change your treatment, we will call you to review the results.   Testing/Procedures: None ordered   Follow-Up: At [facility], you and your health needs are our priority.  As part of our continuing mission to provide you with exceptional heart care, we have created designated Provider Care Teams.  These Care Teams include your primary Cardiologist (physician) and Advanced Practice Providers (APPs -  Physician Assistants and Nurse Practitioners) who all work together to provide you with the care you need, when you need it.  We recommend signing up for the patient portal called "MyChart".  Sign up information is provided on this After Visit Summary.  MyChart is used to connect with patients for Virtual Visits (Telemedicine).  Patients are able to view lab/test results, encounter notes, upcoming appointments, etc.  Non-urgent messages can be sent to your provider as well.   To learn more about what you can do with MyChart, go to [email or web address].    Your next appointment:   6 month(s)  The format for your next appointment:   In Person  Provider:   You may see Traci [name], MD or one of the following Advanced Practice Providers on your designated Care Team:    Dayna Dunn, PA-C  Michele Lenze, PA-C    Other Instructions   

## 2020-03-04 ENCOUNTER — Other Ambulatory Visit: Payer: Self-pay

## 2020-03-04 ENCOUNTER — Encounter (HOSPITAL_BASED_OUTPATIENT_CLINIC_OR_DEPARTMENT_OTHER): Payer: Self-pay | Admitting: Urology

## 2020-03-04 NOTE — Progress Notes (Signed)
Spoke w/ via phone for pre-op interview---pt Lab needs dos---- I stat 8              COVID test ------03-14-2020 1005 Arrive at -------930 am NPO after MN NO Solid Food.  Clear liquids from MN until---830 am then npo Medications to take morning of surgery -----metorpolol tartarte Diabetic medication -----n/a Patient Special Instructions -----none Pre-Op special Istructions -----none Patient verbalized understanding of instructions that were given at this phone interview. Patient denies shortness of breath, chest pain, fever, cough at this phone interview.  Anesthesia Review: no  PCP: dr Shirline Frees Cardiologist :dr Olivia Mackie turner cardiac clearance note bhavinkum bhagat pa 02-08-2020 epic Chest x-ray :none EKG :02-08-2020 epic Echo :07-03-2019 epic Stress test:exercist tolerance test 07-10-2018 epic Cardiac Cath : pt states cardiac cath not done 02-12-2014 cabg x 4 done instead Activity level:  Can climb flight of steps without difficulty does house and yard work without problems, pt states has never had to use nitro Sleep Study/ CPAP :none Fasting Blood Sugar :      / Checks Blood Sugar -- times a day:  n/a Blood Thinner/ Instructions /Last Dose:n/a ASA / Instructions/ Last Dose : instructed by dr borden to stop 81 mg aspirin 5 days before surgery

## 2020-03-12 ENCOUNTER — Other Ambulatory Visit: Payer: Medicare Other | Admitting: *Deleted

## 2020-03-12 ENCOUNTER — Other Ambulatory Visit: Payer: Self-pay

## 2020-03-12 DIAGNOSIS — E7800 Pure hypercholesterolemia, unspecified: Secondary | ICD-10-CM

## 2020-03-12 LAB — LIPID PANEL
Chol/HDL Ratio: 4.2 ratio (ref 0.0–5.0)
Cholesterol, Total: 134 mg/dL (ref 100–199)
HDL: 32 mg/dL — ABNORMAL LOW (ref >39)
LDL Chol Calc (NIH): 71 mg/dL (ref 0–99)
Triglycerides: 185 mg/dL — ABNORMAL HIGH (ref 0–149)
VLDL Cholesterol Cal: 31 mg/dL (ref 5–40)

## 2020-03-13 ENCOUNTER — Telehealth: Payer: Self-pay | Admitting: Pharmacist

## 2020-03-13 MED ORDER — FENOFIBRATE 48 MG PO TABS: 48.0000 mg | ORAL_TABLET | Freq: Every day | ORAL | 11 refills | Status: DC

## 2020-03-13 NOTE — Telephone Encounter (Signed)
TG have improved with improved diet. Pt is currently being treated for prostate cancer. We discussed working on diet and cutting back on saturated fats vs diet and adding fenofibrate (microvascular benefits). Vascepa is cost prohibitive. Pt willing to try fenofibrate. Will send Rx for fenofibrate 48mg  daily. Recheck lipids in 3 months.

## 2020-03-14 ENCOUNTER — Other Ambulatory Visit (HOSPITAL_COMMUNITY)
Admission: RE | Admit: 2020-03-14 | Discharge: 2020-03-14 | Disposition: A | Discharge status: Home / Self Care | Payer: Medicare Other | Source: Ambulatory Visit | Attending: Urology | Admitting: Urology

## 2020-03-14 ENCOUNTER — Telehealth: Payer: Self-pay | Admitting: *Deleted

## 2020-03-14 DIAGNOSIS — Z20822 Contact with and (suspected) exposure to covid-19: Secondary | ICD-10-CM | POA: Diagnosis not present

## 2020-03-14 DIAGNOSIS — Z01812 Encounter for preprocedural laboratory examination: Secondary | ICD-10-CM | POA: Insufficient documentation

## 2020-03-14 NOTE — Telephone Encounter (Signed)
CALLED PATIENT TO REMIND OF PROCEDURE FOR 03-17-20, LVM FOR A RETURN CALL

## 2020-03-14 NOTE — H&P (Signed)
Prostate Cancer    Melvin Dunn is a 69 year old gentleman who was initially diagnosed with prostate cancer in September 2011 when his PSA was 4.25 and he was diagnosed with Gleason 3+3=6 adenocarcinoma in 5% of 1 out of 12 biopsy cores. He proceeded with active surveillance. An MRI from 2019 was suspicious for EPE and demonstrated a PI-RADS 4 lesion. A biopsy in August 2019 was performed and Gleason 3+4=7 disease was noted with 3 out of 3 MR targeted lesions and 2 out of 12 systematic biopsies positive. After further discussion, he still wished to proceed with surveillance despite his upgraded disease and the concern for EPE. His PSA increased to 10.6 and he underwent another MRI of the prostate that again demonstrated a PI-RADS 4 lesion and the same concern for EPE. His biopsy on 09/05/19 indicated further upgraded disease with Gleason 4+3=7 disease with 3 out of 6 targeted biopsies positive and multiple other systematic biopsy cores positive for Gleason 4+3=7 disease. He has been counseled by Dr. Tammi Klippel as well.   He also has a known 1 cm bladder stone that has not yet been addressed. This raises concern for bladder outlet obstruction.   He has a history of hematuria and underwent an evaluation in 2017 that confirmed bilateral non-obstructing urolithiasis. He was also noted to incidentally have a bulbar urethral stricture that was easily navigated with a cystoscope.   Family history: None.   Imaging studies: MRI (07/23/19) - EPE, CT abd/pelvis (11/28/19) - No metastatic disease, 1 cm bladder stone, Bone scan (11/28/19) - negative for metastatic disease   PMH: He has a history of CAD s/p CABG, asthma, hyperlipidemia, and hypertension.  PSH: No abdominal surgeries.   TNM stage: cT3a N0 M0  PSA: 10.6  Gleason score: 4+3=7  Biopsy (09/05/19): 8/18 cores positive  Left: L lateral apex (10%, 4+3=7), L mid (3%, 4+3=7), L lateral mid (30%, 4+3=7)  Right: R lateral apex (30%, 4+3=7)  MR targets: ROI #1 (3/3  cores, 4+3=7, 70%, 60%, 40%, PNI), ROI # 2 (1/3 cores, 3+3=6, 5%)  Prostate volume: 33 cc   Nomogram  OC disease: 12%  EPE: 88%  SVI: 18%  LNI: 21%  PFS (5 year, 10 year): 39%, 25%   Urinary function: IPSS is 3.  Erectile function: SHIM score is 2.     ALLERGIES: No Allergies    MEDICATIONS: Aspirin 325 mg tablet  Levaquin 750 mg tablet Take the morning of your prostate biopsy.  Metoprolol Tartrate 25 mg tablet  Atorvastatin Calcium 80 mg tablet  Dulera 200 mcg-5 mcg/actuation hfa aerosol with adapter  Flonase 50 mcg/actuation spray, suspension 0 Nasal  Zetia 10 mg tablet Oral     GU PSH: Locm 300-399Mg /Ml Iodine,1Ml - 11/28/2019 Prostate Needle Biopsy - 09/05/2019, 12/29/2017, 2012       PSH Notes: CABG (CABG), Biopsy Of The Prostate Needle, Leg Repair   NON-GU PSH: CABG (coronary artery bypass grafting) - 2016 Surgical Pathology, Gross And Microscopic Examination For Prostate Needle - 09/05/2019, 12/29/2017     GU PMH: Prostate Cancer - 10/16/2019, - 09/11/2019, - 09/05/2019, - 08/15/2019, Although his prostate was free of any new nodularity or induration his PSA has increased to 10.6 which is higher than it has ever been in the past. We therefore discussed further evaluation with a prostate MRI. I did discuss fusion biopsy as an option depending on the results of his MRI scan and told him I would contact him with the results of the study and  my recommendations., - 06/25/2019 (Stable), His prostate remains benign. His current PSA is 6.75 which is down from where was 6 months ago. His PSA has varied over time. At this point I have recommended continued active surveillance with repeat DRE and PSA again in 6 months., - 01/01/2019 (Stable), His DRE reveals no abnormality. His PSA is 7.67. I will continue active surveillance with repeat DRE and PSA again in 6 months., - 07/11/2018 (Stable), He has elected to proceed with continued active surveillance and will return in 6 months for repeat DRE  and PSA., - 01/04/2018 (Stable), I have discussed with the patient the possibility of blood per rectum, per urethra and in the ejaculate. He was counseled to contact me if he has any difficulties following his prostate biopsy whatsoever., - 12/29/2017 (Stable), His prostate is smooth and benign. His PSA is 5.53. We discussed the fact that it has been 7 years since his prostate biopsy and his PSA has remained stable but he has not undergone a repeat biopsy. We discussed the options and he has elected to proceed with an MRI scan understanding that if potentially high-grade lesions are found this would allow for fusion biopsy to target the lesions more accurately., - 2019 (Stable), His prostate remains smooth. His PSA has gone up slightly. We discussed the fact that it has been higher in the past and I told him that if he should show a continued trend upward I would recommend a repeat biopsy. I will otherwise plan to see him back in 6 months for another DRE and PSA at that time., - 2018 Bladder Stone - 07/23/2019 BPH w/o LUTS (Stable), His prostate is enlarged to exam but is smooth and he has no significant voiding symptoms at this time. - 2018 ED due to arterial insufficiency, He wanted to consider sildenafil but will contact me if he would like to proceed with that. - 2017 Renal calculus, Renal calculus, bilateral - 2017 Renal cyst, Bilateral renal cysts - 2017 Urethral Stricture, Unspec, Urethral stricture - 2017      PMH Notes: Nephrolithiasis: He was found to have bilateral nonobstructing renal calculi (2 in the right kidney and 4 in the left kidney) by CT scan in 3/17.   Microscopic hematuria: He was found to have 3-10 rbc's/hpf on UA in 2/17. He underwent evaluation with cystoscopy and CT scan. His CT scan revealed bilateral renal calculi and bilateral renal cysts but no worrisome lesions. Cystoscopically I found a mild bulbar urethral stricture that was dilated with the cystoscope.   Bulbar urethral  stricture. He was found to have a mild bulbar urethral stricture, cystoscopy for microscopic hematuria in 3/17. This was dilated with the cystoscope and no further therapy was necessary.     NON-GU PMH: Asthma, Asthma - 2014 Personal history of other diseases of the respiratory system, History of allergic rhinitis - 2014 Personal history of other endocrine, nutritional and metabolic disease, History of hyperlipidemia - 2014    FAMILY HISTORY: Dementia - Runs In Family Family Health Status Number - Runs In Family Father Deceased At [place] ___ - Runs In Family   SOCIAL HISTORY: Marital Status: Married Preferred Language: English; Ethnicity: Not Hispanic Or Latino; Race: White Current Smoking Status: Patient has never smoked.  Has never drank.  Drinks 3 caffeinated drinks per day.     Notes: Drug Use, Alcohol Use, Never A Smoker, Marital History - Currently Married   REVIEW OF SYSTEMS:    GU Review Male:   Patient denies  leakage of urine, hard to postpone urination, get up at night to urinate, burning/ pain with urination, have to strain to urinate , frequent urination, trouble starting your streams, and stream starts and stops.  Gastrointestinal (Lower):   Patient denies diarrhea and constipation.  Gastrointestinal (Upper):   Patient denies nausea and vomiting.  Constitutional:   Patient denies fever, night sweats, weight loss, and fatigue.  Skin:   Patient denies skin rash/ lesion and itching.  Eyes:   Patient denies blurred vision and double vision.  Ears/ Nose/ Throat:   Patient denies sore throat and sinus problems.  Hematologic/Lymphatic:   Patient denies swollen glands and easy bruising.  Cardiovascular:   Patient denies leg swelling and chest pains.  Respiratory:   Patient denies cough and shortness of breath.  Endocrine:   Patient denies excessive thirst.  Musculoskeletal:   Patient denies back pain and joint pain.  Neurological:   Patient denies headaches and dizziness.   Psychologic:   Patient denies depression and anxiety.    Weight 150 lb / 68.04 kg  Height 67 in / 170.18 cm  BMI 23.5 kg/m   MULTI-SYSTEM PHYSICAL EXAMINATION:    Constitutional: Well-nourished. No physical deformities. Normally developed. Good grooming.  Respiratory: No labored breathing, no use of accessory muscles.   Cardiovascular: Normal temperature, normal extremity pulses, no swelling, no varicosities.        ASSESSMENT:      ICD-10 Details  1 GU:   Prostate Cancer - C61   2   Bladder Stone - N21.0   3   BPH w/LUTS - N40.1   4   Incomplete bladder emptying - R39.14   5   Bulbar urethral stricture - N35.011    PLAN:        1. Locally advanced prostate cancer: I had a long and detailed discussion with Melvin Dunn today regarding his prostate cancer. He apparently had been set on proceeding with brachytherapy for treatment after discussion with Dr. Karsten Ro. After further discussion today, I brought of my concerns about the fact that it appears he likely has locally advanced prostate cancer in that this would be optimally treated with external beam radiation therapy and long-term androgen deprivation if he does elect radiotherapy. We also discussed the option of surgical therapy. Finally, I expressed my concerns about his history of urethral stricture in the possibility he may have bladder outlet obstruction considering his bladder calculus noted on CT imaging. As such, I have asked him to give further thought to his decision. The patient was counseled about the natural history of prostate cancer and the standard treatment options that are available for prostate cancer. It was explained to him how his age and life expectancy, clinical stage, Gleason score, and PSA affect his prognosis, the decision to proceed with additional staging studies, as well as how that information influences recommended treatment strategies. We discussed the roles for active surveillance, radiation therapy,  surgical therapy, androgen deprivation, as well as ablative therapy options for the treatment of prostate cancer as appropriate to his individual cancer situation. We discussed the risks and benefits of these options with regard to their impact on cancer control and also in terms of potential adverse events, complications, and impact on quality of life particularly related to urinary and sexual function. The patient was encouraged to ask questions throughout the discussion today and all questions were answered to his stated satisfaction. In addition, the patient was provided with and/or directed to appropriate resources and literature for  further education about prostate cancer and treatment options.   He was very appreciative of the discussion and plans to discuss things further with his wife and his daughter who is a Marine scientist in [place]. He stated that his daughter may wish to talk to me in that this would be okay with him. I told him that would be fine and that she could call any time. He will notify me once he has made a final decision about treatment.   2. Bladder calculus: We discussed this finding and that the most likely etiology would be bladder outlet obstruction. I have recommended that we at least deal with his bladder stone prior to his radiation therapy. He is in agreement but wishes to also talk this over with his wife and daughter before proceeding. We discussed undergoing cystolitholapaxy today in detail including the potential risks, complications, and expected recovery process. In addition, his wife is currently recovering from a ventral hernia repair and is approximately 1 week postop.    Addendum: Melvin Dunn confirmed his decision to proceed with long term ADT and radiation therapy. We will plan to start Eligard 45 mg. He will then be scheduled for about 6-8 weeks from now for cystolithalopaxy, fiducial marker placement and Space OAR hydrogel insertion.

## 2020-03-15 LAB — SARS CORONAVIRUS 2 (TAT 6-24 HRS): SARS Coronavirus 2: NEGATIVE

## 2020-03-17 ENCOUNTER — Ambulatory Visit (HOSPITAL_BASED_OUTPATIENT_CLINIC_OR_DEPARTMENT_OTHER): Payer: Medicare Other | Admitting: Anesthesiology

## 2020-03-17 ENCOUNTER — Encounter (HOSPITAL_BASED_OUTPATIENT_CLINIC_OR_DEPARTMENT_OTHER)
Admission: RE | Disposition: A | Discharge status: Home / Self Care | Payer: Self-pay | Source: Home / Self Care | Attending: Urology

## 2020-03-17 ENCOUNTER — Ambulatory Visit (HOSPITAL_BASED_OUTPATIENT_CLINIC_OR_DEPARTMENT_OTHER)
Admission: RE | Admit: 2020-03-17 | Discharge: 2020-03-17 | Disposition: A | Discharge status: Home / Self Care | Payer: Medicare Other | Attending: Urology | Admitting: Urology

## 2020-03-17 ENCOUNTER — Encounter (HOSPITAL_BASED_OUTPATIENT_CLINIC_OR_DEPARTMENT_OTHER): Payer: Self-pay | Admitting: Urology

## 2020-03-17 DIAGNOSIS — C61 Malignant neoplasm of prostate: Secondary | ICD-10-CM

## 2020-03-17 DIAGNOSIS — R338 Other retention of urine: Secondary | ICD-10-CM | POA: Insufficient documentation

## 2020-03-17 DIAGNOSIS — N401 Enlarged prostate with lower urinary tract symptoms: Secondary | ICD-10-CM | POA: Insufficient documentation

## 2020-03-17 DIAGNOSIS — N35912 Unspecified bulbous urethral stricture, male: Secondary | ICD-10-CM | POA: Insufficient documentation

## 2020-03-17 DIAGNOSIS — N210 Calculus in bladder: Secondary | ICD-10-CM | POA: Diagnosis not present

## 2020-03-17 HISTORY — PX: CYSTOSCOPY WITH LITHOLAPAXY: SHX1425

## 2020-03-17 HISTORY — DX: Unspecified convulsions: R56.9

## 2020-03-17 HISTORY — DX: Personal history of urinary calculi: Z87.442

## 2020-03-17 HISTORY — PX: GOLD SEED IMPLANT: SHX6343

## 2020-03-17 HISTORY — PX: SPACE OAR INSTILLATION: SHX6769

## 2020-03-17 HISTORY — DX: Prediabetes: R73.03

## 2020-03-17 LAB — POCT I-STAT, CHEM 8
BUN: 26 mg/dL — ABNORMAL HIGH (ref 8–23)
Calcium, Ion: 1.34 mmol/L (ref 1.15–1.40)
Chloride: 105 mmol/L (ref 98–111)
Creatinine, Ser: 1 mg/dL (ref 0.61–1.24)
Glucose, Bld: 129 mg/dL — ABNORMAL HIGH (ref 70–99)
HCT: 43 % (ref 39.0–52.0)
Hemoglobin: 14.6 g/dL (ref 13.0–17.0)
Potassium: 4.5 mmol/L (ref 3.5–5.1)
Sodium: 142 mmol/L (ref 135–145)
TCO2: 24 mmol/L (ref 22–32)

## 2020-03-17 SURGERY — INSERTION, GOLD SEEDS
Anesthesia: General | Site: Rectum

## 2020-03-17 MED ORDER — OXYCODONE HCL 5 MG PO TABS: 5.0000 mg | ORAL_TABLET | Freq: Once | ORAL | Status: DC | PRN

## 2020-03-17 MED ORDER — FENTANYL CITRATE (PF) 100 MCG/2ML IJ SOLN
INTRAMUSCULAR | Status: DC | PRN
  Administered 2020-03-17: 25 ug via INTRAVENOUS
  Administered 2020-03-17: 50 ug via INTRAVENOUS
  Administered 2020-03-17: 25 ug via INTRAVENOUS

## 2020-03-17 MED ORDER — ONDANSETRON HCL 4 MG/2ML IJ SOLN
INTRAMUSCULAR | Status: AC
  Filled 2020-03-17: qty 2

## 2020-03-17 MED ORDER — PHENYLEPHRINE HCL (PRESSORS) 10 MG/ML IV SOLN
INTRAVENOUS | Status: DC | PRN
  Administered 2020-03-17 (×4): 80 ug via INTRAVENOUS

## 2020-03-17 MED ORDER — ACETAMINOPHEN 500 MG PO TABS
1000.0000 mg | ORAL_TABLET | Freq: Once | ORAL | Status: AC
  Administered 2020-03-17: 1000 mg via ORAL

## 2020-03-17 MED ORDER — PROMETHAZINE HCL 25 MG/ML IJ SOLN: 6.2500 mg | INTRAMUSCULAR | Status: DC | PRN

## 2020-03-17 MED ORDER — MIDAZOLAM HCL 5 MG/5ML IJ SOLN
INTRAMUSCULAR | Status: DC | PRN
  Administered 2020-03-17: 2 mg via INTRAVENOUS

## 2020-03-17 MED ORDER — CEFAZOLIN SODIUM-DEXTROSE 2-4 GM/100ML-% IV SOLN
2.0000 g | Freq: Once | INTRAVENOUS | Status: AC
  Administered 2020-03-17: 2 g via INTRAVENOUS

## 2020-03-17 MED ORDER — PHENYLEPHRINE 40 MCG/ML (10ML) SYRINGE FOR IV PUSH (FOR BLOOD PRESSURE SUPPORT)
PREFILLED_SYRINGE | INTRAVENOUS | Status: AC
  Filled 2020-03-17: qty 10

## 2020-03-17 MED ORDER — ONDANSETRON HCL 4 MG/2ML IJ SOLN
INTRAMUSCULAR | Status: DC | PRN
  Administered 2020-03-17: 4 mg via INTRAVENOUS

## 2020-03-17 MED ORDER — DEXAMETHASONE SODIUM PHOSPHATE 10 MG/ML IJ SOLN
INTRAMUSCULAR | Status: AC
  Filled 2020-03-17: qty 1

## 2020-03-17 MED ORDER — CEFAZOLIN SODIUM-DEXTROSE 2-4 GM/100ML-% IV SOLN
INTRAVENOUS | Status: AC
  Filled 2020-03-17: qty 100

## 2020-03-17 MED ORDER — DEXAMETHASONE SODIUM PHOSPHATE 10 MG/ML IJ SOLN
INTRAMUSCULAR | Status: DC | PRN
  Administered 2020-03-17: 10 mg via INTRAVENOUS

## 2020-03-17 MED ORDER — STERILE WATER FOR IRRIGATION IR SOLN
Status: DC | PRN
  Administered 2020-03-17: 4300 mL

## 2020-03-17 MED ORDER — ACETAMINOPHEN 500 MG PO TABS
ORAL_TABLET | ORAL | Status: AC
  Filled 2020-03-17: qty 2

## 2020-03-17 MED ORDER — PROPOFOL 10 MG/ML IV BOLUS
INTRAVENOUS | Status: AC
  Filled 2020-03-17: qty 20

## 2020-03-17 MED ORDER — PHENAZOPYRIDINE HCL 200 MG PO TABS: 200.0000 mg | ORAL_TABLET | Freq: Three times a day (TID) | ORAL | 0 refills | Status: DC | PRN

## 2020-03-17 MED ORDER — OXYCODONE HCL 5 MG/5ML PO SOLN: 5.0000 mg | Freq: Once | ORAL | Status: DC | PRN

## 2020-03-17 MED ORDER — LACTATED RINGERS IV SOLN: INTRAVENOUS | Status: DC

## 2020-03-17 MED ORDER — HYDROMORPHONE HCL 1 MG/ML IJ SOLN: 0.2500 mg | INTRAMUSCULAR | Status: DC | PRN

## 2020-03-17 MED ORDER — PROPOFOL 10 MG/ML IV BOLUS
INTRAVENOUS | Status: DC | PRN
  Administered 2020-03-17: 150 mg via INTRAVENOUS

## 2020-03-17 MED ORDER — MIDAZOLAM HCL 2 MG/2ML IJ SOLN
INTRAMUSCULAR | Status: AC
  Filled 2020-03-17: qty 2

## 2020-03-17 MED ORDER — SODIUM CHLORIDE FLUSH 0.9 % IV SOLN
INTRAVENOUS | Status: DC | PRN
  Administered 2020-03-17: 10 mL via INTRAVENOUS

## 2020-03-17 MED ORDER — FENTANYL CITRATE (PF) 100 MCG/2ML IJ SOLN
INTRAMUSCULAR | Status: AC
  Filled 2020-03-17: qty 2

## 2020-03-17 SURGICAL SUPPLY — 35 items
BAG DRAIN URO-CYSTO SKYTR STRL (DRAIN) ×5 IMPLANT
BAG DRN UROCATH (DRAIN) ×3
BALLN NEPHROSTOMY (BALLOONS) ×5
BALLOON NEPHROSTOMY (BALLOONS) IMPLANT
CLOTH BEACON ORANGE TIMEOUT ST (SAFETY) ×5 IMPLANT
COVER BACK TABLE 60X90IN (DRAPES) ×5 IMPLANT
DRSG TEGADERM 4X4.75 (GAUZE/BANDAGES/DRESSINGS) ×8 IMPLANT
DRSG TEGADERM 8X12 (GAUZE/BANDAGES/DRESSINGS) ×6 IMPLANT
FIBER LASER FLEXIVA 1000 (UROLOGICAL SUPPLIES) IMPLANT
FIBER LASER FLEXIVA 550 (UROLOGICAL SUPPLIES) IMPLANT
GAUZE SPONGE 4X4 12PLY STRL LF (GAUZE/BANDAGES/DRESSINGS) ×2 IMPLANT
GLOVE BIO SURGEON STRL SZ7.5 (GLOVE) ×7 IMPLANT
GLOVE ECLIPSE 8.0 STRL XLNG CF (GLOVE) ×5 IMPLANT
GLOVE SURG ORTHO 8.5 STRL (GLOVE) ×5 IMPLANT
GOWN STRL REUS W/ TWL LRG LVL3 (GOWN DISPOSABLE) ×3 IMPLANT
GOWN STRL REUS W/TWL LRG LVL3 (GOWN DISPOSABLE) ×10 IMPLANT
GUIDEWIRE STR DUAL SENSOR (WIRE) ×2 IMPLANT
IMPL SPACEOAR VUE SYSTEM (Spacer) IMPLANT
IMPLANT SPACEOAR VUE SYSTEM (Spacer) ×5 IMPLANT
KIT TURNOVER CYSTO (KITS) ×5 IMPLANT
MANIFOLD NEPTUNE II (INSTRUMENTS) ×2 IMPLANT
MARKER GOLD PRELOAD 1.2X3 (Urological Implant) ×3 IMPLANT
NS IRRIG 500ML POUR BTL (IV SOLUTION) IMPLANT
PACK CYSTO (CUSTOM PROCEDURE TRAY) ×5 IMPLANT
Piston Syringe IMPLANT
SEED GOLD PRELOAD 1.2X3 (Urological Implant) ×5 IMPLANT
SURGILUBE 2OZ TUBE FLIPTOP (MISCELLANEOUS) ×5 IMPLANT
SYR TOOMEY IRRIG 70ML (MISCELLANEOUS) ×5
SYRINGE TOOMEY IRRIG 70ML (MISCELLANEOUS) IMPLANT
TUBE CONNECTING 12'X1/4 (SUCTIONS) ×1
TUBE CONNECTING 12X1/4 (SUCTIONS) ×1 IMPLANT
TUBING UROLOGY SET (TUBING) ×5 IMPLANT
UNDERPAD 30X36 HEAVY ABSORB (UNDERPADS AND DIAPERS) ×10 IMPLANT
WATER STERILE IRR 3000ML UROMA (IV SOLUTION) ×7 IMPLANT
WATER STERILE IRR 500ML POUR (IV SOLUTION) ×2 IMPLANT

## 2020-03-17 NOTE — Anesthesia Postprocedure Evaluation (Signed)
Anesthesia Post Note  Patient: Melvin Dunn  Procedure(s) Performed: GOLD SEED IMPLANT (N/A Prostate) SPACE OAR INSTILLATION (N/A Rectum) CYSTOSCOPY WITH LITHOLAPAXY (N/A Bladder)     Patient location during evaluation: PACU Anesthesia Type: General Level of consciousness: awake and alert, oriented and patient cooperative Pain management: pain level controlled Vital Signs Assessment: post-procedure vital signs reviewed and stable Respiratory status: spontaneous breathing, nonlabored ventilation and respiratory function stable Cardiovascular status: blood pressure returned to baseline and stable Postop Assessment: no apparent nausea or vomiting Anesthetic complications: no   No complications documented.  Last Vitals:  Vitals:   03/17/20 1312 03/17/20 1315  BP: 133/71 (!) 142/71  Pulse: 65 65  Resp: 17 15  Temp:    SpO2: 98% 99%    Last Pain:  Vitals:   03/17/20 1315  TempSrc:   PainSc: 0-No pain                 Pervis Hocking

## 2020-03-17 NOTE — Transfer of Care (Signed)
Immediate Anesthesia Transfer of Care Note  Patient: Melvin Dunn  Procedure(s) Performed: GOLD SEED IMPLANT (N/A Prostate) SPACE OAR INSTILLATION (N/A Rectum) CYSTOSCOPY WITH LITHOLAPAXY (N/A Bladder)  Patient Location: PACU  Anesthesia Type:General  Level of Consciousness: awake, alert  and oriented  Airway & Oxygen Therapy: Patient Spontanous Breathing and Patient connected to face mask oxygen  Post-op Assessment: Report given to RN and Post -op Vital signs reviewed and stable  Post vital signs: Reviewed and stable  Last Vitals:  Vitals Value Taken Time  BP    Temp    Pulse 59 03/17/20 1306  Resp 16 03/17/20 1306  SpO2 98 % 03/17/20 1306  Vitals shown include unvalidated device data.  Last Pain:  Vitals:   03/17/20 0954  TempSrc: Oral  PainSc: 0-No pain      Patients Stated Pain Goal: 5 (60/63/01 6010)  Complications: No complications documented.

## 2020-03-17 NOTE — Progress Notes (Signed)
Arrival time was 1304

## 2020-03-17 NOTE — Discharge Instructions (Signed)
  Post Anesthesia Home Care Instructions  Activity: Get plenty of rest for the remainder of the day. A responsible adult should stay with you for 24 hours following the procedure.  For the next 24 hours, DO NOT: -Drive a car -Paediatric nurse -Drink alcoholic beverages -Take any medication unless instructed by your physician -Make any legal decisions or sign important papers.  Meals: Start with liquid foods such as gelatin or soup. Progress to regular foods as tolerated. Avoid greasy, spicy, heavy foods. If nausea and/or vomiting occur, drink only clear liquids until the nausea and/or vomiting subsides. Call your physician if vomiting continues.  Special Instructions/Symptoms: Your throat may feel dry or sore from the anesthesia or the breathing tube placed in your throat during surgery. If this causes discomfort, gargle with warm salt water. The discomfort should disappear within 24 hours.  If you had a scopolamine patch placed behind your ear for the management of post- operative nausea and/or vomiting:  1. The medication in the patch is effective for 72 hours, after which it should be removed.  Wrap patch in a tissue and discard in the trash. Wash hands thoroughly with soap and water. 2. You may remove the patch earlier than 72 hours if you experience unpleasant side effects which may include dry mouth, dizziness or visual disturbances. 3. Avoid touching the patch. Wash your hands with soap and water after contact with the patch.   1. You may see some blood in the urine and may have some burning with urination for 48-72 hours. You also may notice that you have to urinate more frequently or urgently after your procedure which is normal.  2. You should call should you develop an inability urinate, fever > 101, persistent nausea and vomiting that prevents you from eating or drinking to stay hydrated.

## 2020-03-17 NOTE — Anesthesia Procedure Notes (Signed)
Procedure Name: LMA Insertion Date/Time: 03/17/2020 12:07 PM Performed by: Venda Dice D, CRNA Pre-anesthesia Checklist: Patient identified, Emergency Drugs available, Suction available and Patient being monitored Patient Re-evaluated:Patient Re-evaluated prior to induction Oxygen Delivery Method: Circle system utilized Preoxygenation: Pre-oxygenation with 100% oxygen Induction Type: IV induction Ventilation: Mask ventilation without difficulty LMA: LMA inserted LMA Size: 4.0 Tube type: Oral Number of attempts: 1 Placement Confirmation: positive ETCO2 and breath sounds checked- equal and bilateral Tube secured with: Tape Dental Injury: Teeth and Oropharynx as per pre-operative assessment

## 2020-03-17 NOTE — Op Note (Signed)
Preoperative diagnosis:  1. Prostate cancer 2. Bladder calculus (2 cm)  Postoperative diagnosis:  1. Prostate cancer 2. Bladder calculus (2 cm) 3. Bulbar urethral stricture  Procedure:  1. Cystoscopy 2. Insertion of SpaceOAR hydrogel  3. Fiducial marker placement into prostate 4. Cystolithalopaxy (2 cm) 5.  Balloon dilation of urethral stricture  Surgeon: Pryor Curia. M.D.  Anesthesia: General  EBL: Minimal  Complications: None  Indication: Melvin Dunn is a 69 y.o. gentleman with locally advanced prostate cancer and a 2 cm bladder calculus. After discussing management options for treatment, he elected to proceed with radiotherapy. He presents today for the above procedures. The potential risks, complications, alternative options, and expected recovery course have been discussed in detail with the patient and he has provided informed consent to proceed.  Description of procedure: The patient was taken to the operating room and a general anesthetic was administered.  He was given preoperative antibiotics, placed in the dorsolithotomy position and his perineum was draped.  Prostate ultrasonography was performed allowing visualization of the prostate.  Three gold fiducial markers were then each placed transperineally under ultrasound guidance into the left apex, right mid lateral, and left base of the prostate gland.  A site in the midline was selected on the perineum for placement of an 18 g needle with saline.  The needle was advanced above the rectum and below Denonvillier's fascia to the mid gland and confirmed to be in the midline on transverse imaging.  One cc of saline was injected confirming appropriate expansion of this space.  A total of 5 cc of saline was then injected to open the space further bilaterally.  The saline syringe was then removed and the SpaceOAR hydrogel was injected with good distribution bilaterally.  The patient was then again prepped and draped for  cystoscopy.  Using a 77 French rigid cystoscope, the urethra was carefully examined and there was noted to be a bulbar urethral stricture that would not allow passage of the rigid cystoscope.  A 0.38 sensor guidewire was then advanced through the urethra into the bladder.  A 24 French Ultraxx nephrostomy balloon was then placed over the wire and across the short bulbar stricture.  This was then inflated to 16 mmHg and left inflated for a few minutes.  The balloon was then deflated and reinspection revealed the stricture to be widely patent allowing passage of the cystoscope.  Inspection of the bladder did reveal a 2 cm bladder calculus as previously noted on imaging.  Using the lithotrite, the stone was fragmented into smaller pieces.  The remaining largest fragment was able to be removed intact via the urethra.  Reinspection of the bladder revealed multiple stone fragments which were then irrigated with a Toomey syringe.  Once all bladder fragments were removed, reinspection of the bladder revealed no further fragments.  There was noted to be some active oozing from the prostatic urethra.  A Bugbee electrode was used to cauterize a couple of areas resulting in good hemostasis.  It was not felt that a catheter would be necessary.  The patient was able to be awakened and transferred to recovery unit in satisfactory condition for a voiding trial.

## 2020-03-17 NOTE — Anesthesia Preprocedure Evaluation (Addendum)
Anesthesia Evaluation  Patient identified by MRN, date of birth, ID band Patient awake    Reviewed: Allergy & Precautions, NPO status , Patient's Chart, lab work & pertinent test results  Airway Mallampati: II  TM Distance: >3 FB Neck ROM: Full   Comment: Large beard Dental  (+) Poor Dentition, Dental Advisory Given Very poor dentition, nothing loose per patient:   Pulmonary asthma ,    Pulmonary exam normal breath sounds clear to auscultation       Cardiovascular + CAD, + CABG (CABG x 4 2015) and +CHF (grade 1 diastolic dysfunction)  Normal cardiovascular exam+ Valvular Problems/Murmurs (mod AS, mild AI) AS and AI  Rhythm:Regular Rate:Normal  Echo 06/2019: 1. Left ventricular ejection fraction, by estimation, is 55 to 60%. Left  ventricular ejection fraction by 3D volume is 58 %. The left ventricle has  normal function. The left ventricle has no regional wall motion  abnormalities. Left ventricular diastolic  parameters are consistent with Grade I diastolic dysfunction (impaired  relaxation).  2. Right ventricular systolic function is normal. The right ventricular  size is normal. There is normal pulmonary artery systolic pressure. The  estimated right ventricular systolic pressure is 92.3 mmHg.  3. The mitral valve is grossly normal. Trivial mitral valve  regurgitation. No evidence of mitral stenosis.  4. Moderate aortic stenosis is present. LVOT VTI signal is poorly  quantitated, making AVA by VTI low. By Vmax and mean gradient, moderate AS  is present. This is stable compared with study 12/12/2018. Aorti  regurgitation remains mild. The aortic valve is  tricuspid. Aortic valve regurgitation is mild. Moderate aortic valve  stenosis. Aortic valve mean gradient measures 28.5 mmHg. Aortic valve Vmax  measures 3.64 m/s.  5. The inferior vena cava is normal in size with greater than 50%  respiratory variability, suggesting  right atrial pressure of 3 mmHg.    Neuro/Psych Had seizure with local from prostate biopsy, no other hx seizures  negative neurological ROS  negative psych ROS   GI/Hepatic negative GI ROS, Neg liver ROS,   Endo/Other  diabetesPre-diabetic, no meds  Renal/GU negative Renal ROS   Prostate ca    Musculoskeletal negative musculoskeletal ROS (+)   Abdominal   Peds  Hematology negative hematology ROS (+)   Anesthesia Other Findings   Reproductive/Obstetrics negative OB ROS                            Anesthesia Physical Anesthesia Plan  ASA: III  Anesthesia Plan: General   Post-op Pain Management:    Induction: Intravenous  PONV Risk Score and Plan: Ondansetron, Dexamethasone, Midazolam and Treatment may vary due to age or medical condition  Airway Management Planned: LMA  Additional Equipment: None  Intra-op Plan:   Post-operative Plan: Extubation in OR  Informed Consent: I have reviewed the patients History and Physical, chart, labs and discussed the procedure including the risks, benefits and alternatives for the proposed anesthesia with the patient or authorized representative who has indicated his/her understanding and acceptance.     Dental advisory given  Plan Discussed with: CRNA  Anesthesia Plan Comments:        Anesthesia Quick Evaluation

## 2020-03-18 ENCOUNTER — Encounter (HOSPITAL_BASED_OUTPATIENT_CLINIC_OR_DEPARTMENT_OTHER): Payer: Self-pay | Admitting: Urology

## 2020-03-20 ENCOUNTER — Telehealth: Payer: Self-pay | Admitting: *Deleted

## 2020-03-20 NOTE — Telephone Encounter (Signed)
XXXX 

## 2020-03-20 NOTE — Telephone Encounter (Signed)
CALLED PATIENT TO REMIND OF SIM AND MRI FOR 03-21-20, SPOKE WITH PATIENT AND HE IS AWARE OF THESE APPTS.

## 2020-03-21 ENCOUNTER — Other Ambulatory Visit: Payer: Self-pay

## 2020-03-21 ENCOUNTER — Encounter: Payer: Self-pay | Admitting: Urology

## 2020-03-21 ENCOUNTER — Ambulatory Visit (HOSPITAL_COMMUNITY)
Admission: RE | Admit: 2020-03-21 | Discharge: 2020-03-21 | Disposition: A | Discharge status: Home / Self Care | Payer: Medicare Other | Source: Ambulatory Visit | Attending: Urology | Admitting: Urology

## 2020-03-21 ENCOUNTER — Encounter: Payer: Self-pay | Admitting: Medical Oncology

## 2020-03-21 ENCOUNTER — Ambulatory Visit
Admission: RE | Admit: 2020-03-21 | Discharge: 2020-03-21 | Disposition: A | Discharge status: Home / Self Care | Payer: Medicare Other | Source: Ambulatory Visit | Attending: Radiation Oncology | Admitting: Radiation Oncology

## 2020-03-21 DIAGNOSIS — C61 Malignant neoplasm of prostate: Secondary | ICD-10-CM | POA: Insufficient documentation

## 2020-03-21 DIAGNOSIS — Z510 Encounter for antineoplastic radiation therapy: Secondary | ICD-10-CM | POA: Diagnosis present

## 2020-03-21 NOTE — Progress Notes (Signed)
  Radiation Oncology         (336) [phone] ________________________________  Name: Melvin Dunn MRN: [id number]  Date: 03/21/2020  DOB: 12/05/1950  SIMULATION AND TREATMENT PLANNING NOTE    ICD-10-CM   1. Malignant neoplasm of prostate ([place])  C61     DIAGNOSIS:  69 y.o. gentleman with Stage T1c adenocarcinoma of the prostate with Gleason Score of 4+3, and PSA of 10.6.  NARRATIVE:  The patient was brought to the [facility].  Identity was confirmed.  All relevant records and images related to the planned course of therapy were reviewed.  The patient freely provided informed written consent to proceed with treatment after reviewing the details related to the planned course of therapy. The consent form was witnessed and verified by the simulation staff.  Then, the patient was set-up in a stable reproducible supine position for radiation therapy.  A vacuum lock pillow device was custom fabricated to position his legs in a reproducible immobilized position.  Then, I performed a urethrogram under sterile conditions to identify the prostatic bed.  CT images were obtained.  Surface markings were placed.  The CT images were loaded into the planning software.  Then the prostate bed target, pelvic lymph node target and avoidance structures including the rectum, bladder, bowel and hips were contoured.  Treatment planning then occurred.  The radiation prescription was entered and confirmed.  A total of one complex treatment devices were fabricated. I have requested : Intensity Modulated Radiotherapy (IMRT) is medically necessary for this case for the following reason:  Rectal sparing.Marland Kitchen  PLAN:  The patient will receive 45 Gy in 25 fractions of 1.8 Gy, followed by a boost to the prostate to a total dose of 75 Gy with 15 additional fractions of 2 Gy.   ________________________________  Sheral Apley Tammi Klippel, M.D.

## 2020-03-21 NOTE — Progress Notes (Signed)
Following consult visit with Dr. Alinda Money, due to concerns for locally advanced prostate cancer based on findings on MRI prostate, the patient has elected to proceed with LT-ADT concurrent with 8 weeks of prostate IMRT. He received a 6 month Eligard injection on 01/23/20 and had fiducial markers and SpaceOAR gel placed 03/17/20 in preparation for his daily radiation treatments. Patient is here today for CT SIM, in anticipation of beginning his daily treatments in the near future.  Nicholos Johns, MMS, PA-C [facility]: [phone]  Fax: [phone]

## 2020-03-21 NOTE — Progress Notes (Signed)
Patient here for CT simulation. He initially chose brachytherapy but after discussion with Dr. Alinda Money and concerns for bladder outlet obstruction, he has decided on ADT and radiation.He states he had to have a catheter placed after gold markers and SpaceOar gel. He is hoping to get it out on Tuesday.

## 2020-03-31 DIAGNOSIS — Z510 Encounter for antineoplastic radiation therapy: Secondary | ICD-10-CM | POA: Diagnosis not present

## 2020-04-01 ENCOUNTER — Encounter: Payer: Self-pay | Admitting: Medical Oncology

## 2020-04-01 ENCOUNTER — Ambulatory Visit
Admission: RE | Admit: 2020-04-01 | Discharge: 2020-04-01 | Disposition: A | Discharge status: Home / Self Care | Payer: Medicare Other | Source: Ambulatory Visit | Attending: Radiation Oncology | Admitting: Radiation Oncology

## 2020-04-01 DIAGNOSIS — Z510 Encounter for antineoplastic radiation therapy: Secondary | ICD-10-CM | POA: Diagnosis not present

## 2020-04-02 ENCOUNTER — Ambulatory Visit
Admission: RE | Admit: 2020-04-02 | Discharge: 2020-04-02 | Disposition: A | Discharge status: Home / Self Care | Payer: Medicare Other | Source: Ambulatory Visit | Attending: Radiation Oncology | Admitting: Radiation Oncology

## 2020-04-02 DIAGNOSIS — Z510 Encounter for antineoplastic radiation therapy: Secondary | ICD-10-CM | POA: Diagnosis not present

## 2020-04-03 ENCOUNTER — Ambulatory Visit
Admission: RE | Admit: 2020-04-03 | Discharge: 2020-04-03 | Disposition: A | Discharge status: Home / Self Care | Payer: Medicare Other | Source: Ambulatory Visit | Attending: Radiation Oncology | Admitting: Radiation Oncology

## 2020-04-03 DIAGNOSIS — Z510 Encounter for antineoplastic radiation therapy: Secondary | ICD-10-CM | POA: Diagnosis not present

## 2020-04-04 ENCOUNTER — Ambulatory Visit
Admission: RE | Admit: 2020-04-04 | Discharge: 2020-04-04 | Disposition: A | Discharge status: Home / Self Care | Payer: Medicare Other | Source: Ambulatory Visit | Attending: Radiation Oncology | Admitting: Radiation Oncology

## 2020-04-04 DIAGNOSIS — Z510 Encounter for antineoplastic radiation therapy: Secondary | ICD-10-CM | POA: Diagnosis not present

## 2020-04-07 ENCOUNTER — Other Ambulatory Visit: Payer: Self-pay

## 2020-04-07 ENCOUNTER — Ambulatory Visit
Admission: RE | Admit: 2020-04-07 | Discharge: 2020-04-07 | Disposition: A | Discharge status: Home / Self Care | Payer: Medicare Other | Source: Ambulatory Visit | Attending: Radiation Oncology | Admitting: Radiation Oncology

## 2020-04-07 DIAGNOSIS — Z510 Encounter for antineoplastic radiation therapy: Secondary | ICD-10-CM | POA: Diagnosis not present

## 2020-04-08 ENCOUNTER — Ambulatory Visit
Admission: RE | Admit: 2020-04-08 | Discharge: 2020-04-08 | Disposition: A | Discharge status: Home / Self Care | Payer: Medicare Other | Source: Ambulatory Visit | Attending: Radiation Oncology | Admitting: Radiation Oncology

## 2020-04-08 DIAGNOSIS — Z510 Encounter for antineoplastic radiation therapy: Secondary | ICD-10-CM | POA: Diagnosis not present

## 2020-04-09 ENCOUNTER — Ambulatory Visit
Admission: RE | Admit: 2020-04-09 | Discharge: 2020-04-09 | Disposition: A | Discharge status: Home / Self Care | Payer: Medicare Other | Source: Ambulatory Visit | Attending: Radiation Oncology | Admitting: Radiation Oncology

## 2020-04-09 DIAGNOSIS — Z510 Encounter for antineoplastic radiation therapy: Secondary | ICD-10-CM | POA: Diagnosis not present

## 2020-04-14 ENCOUNTER — Ambulatory Visit
Admission: RE | Admit: 2020-04-14 | Discharge: 2020-04-14 | Disposition: A | Discharge status: Home / Self Care | Payer: Medicare Other | Source: Ambulatory Visit | Attending: Radiation Oncology | Admitting: Radiation Oncology

## 2020-04-14 DIAGNOSIS — Z510 Encounter for antineoplastic radiation therapy: Secondary | ICD-10-CM | POA: Diagnosis not present

## 2020-04-15 ENCOUNTER — Ambulatory Visit
Admission: RE | Admit: 2020-04-15 | Discharge: 2020-04-15 | Disposition: A | Discharge status: Home / Self Care | Payer: Medicare Other | Source: Ambulatory Visit | Attending: Radiation Oncology | Admitting: Radiation Oncology

## 2020-04-15 DIAGNOSIS — Z510 Encounter for antineoplastic radiation therapy: Secondary | ICD-10-CM | POA: Diagnosis not present

## 2020-04-16 ENCOUNTER — Ambulatory Visit
Admission: RE | Admit: 2020-04-16 | Discharge: 2020-04-16 | Disposition: A | Discharge status: Home / Self Care | Payer: Medicare Other | Source: Ambulatory Visit | Attending: Radiation Oncology | Admitting: Radiation Oncology

## 2020-04-16 DIAGNOSIS — C61 Malignant neoplasm of prostate: Secondary | ICD-10-CM | POA: Insufficient documentation

## 2020-04-16 DIAGNOSIS — Z510 Encounter for antineoplastic radiation therapy: Secondary | ICD-10-CM | POA: Diagnosis present

## 2020-04-17 ENCOUNTER — Ambulatory Visit
Admission: RE | Admit: 2020-04-17 | Discharge: 2020-04-17 | Disposition: A | Discharge status: Home / Self Care | Payer: Medicare Other | Source: Ambulatory Visit | Attending: Radiation Oncology | Admitting: Radiation Oncology

## 2020-04-17 DIAGNOSIS — Z510 Encounter for antineoplastic radiation therapy: Secondary | ICD-10-CM | POA: Diagnosis not present

## 2020-04-18 ENCOUNTER — Ambulatory Visit
Admission: RE | Admit: 2020-04-18 | Discharge: 2020-04-18 | Disposition: A | Discharge status: Home / Self Care | Payer: Medicare Other | Source: Ambulatory Visit | Attending: Radiation Oncology | Admitting: Radiation Oncology

## 2020-04-18 DIAGNOSIS — Z510 Encounter for antineoplastic radiation therapy: Secondary | ICD-10-CM | POA: Diagnosis not present

## 2020-04-21 ENCOUNTER — Ambulatory Visit
Admission: RE | Admit: 2020-04-21 | Discharge: 2020-04-21 | Disposition: A | Discharge status: Home / Self Care | Payer: Medicare Other | Source: Ambulatory Visit | Attending: Radiation Oncology | Admitting: Radiation Oncology

## 2020-04-21 DIAGNOSIS — Z510 Encounter for antineoplastic radiation therapy: Secondary | ICD-10-CM | POA: Diagnosis not present

## 2020-04-22 ENCOUNTER — Ambulatory Visit
Admission: RE | Admit: 2020-04-22 | Discharge: 2020-04-22 | Disposition: A | Discharge status: Home / Self Care | Payer: Medicare Other | Source: Ambulatory Visit | Attending: Radiation Oncology | Admitting: Radiation Oncology

## 2020-04-22 DIAGNOSIS — Z510 Encounter for antineoplastic radiation therapy: Secondary | ICD-10-CM | POA: Diagnosis not present

## 2020-04-23 ENCOUNTER — Ambulatory Visit
Admission: RE | Admit: 2020-04-23 | Discharge: 2020-04-23 | Disposition: A | Discharge status: Home / Self Care | Payer: Medicare Other | Source: Ambulatory Visit | Attending: Radiation Oncology | Admitting: Radiation Oncology

## 2020-04-23 DIAGNOSIS — Z510 Encounter for antineoplastic radiation therapy: Secondary | ICD-10-CM | POA: Diagnosis not present

## 2020-04-24 ENCOUNTER — Ambulatory Visit
Admission: RE | Admit: 2020-04-24 | Discharge: 2020-04-24 | Disposition: A | Discharge status: Home / Self Care | Payer: Medicare Other | Source: Ambulatory Visit | Attending: Radiation Oncology | Admitting: Radiation Oncology

## 2020-04-24 DIAGNOSIS — Z510 Encounter for antineoplastic radiation therapy: Secondary | ICD-10-CM | POA: Diagnosis not present

## 2020-04-25 ENCOUNTER — Ambulatory Visit
Admission: RE | Admit: 2020-04-25 | Discharge: 2020-04-25 | Disposition: A | Discharge status: Home / Self Care | Payer: Medicare Other | Source: Ambulatory Visit | Attending: Radiation Oncology | Admitting: Radiation Oncology

## 2020-04-25 DIAGNOSIS — Z510 Encounter for antineoplastic radiation therapy: Secondary | ICD-10-CM | POA: Diagnosis not present

## 2020-04-28 ENCOUNTER — Ambulatory Visit
Admission: RE | Admit: 2020-04-28 | Discharge: 2020-04-28 | Disposition: A | Discharge status: Home / Self Care | Payer: Medicare Other | Source: Ambulatory Visit | Attending: Radiation Oncology | Admitting: Radiation Oncology

## 2020-04-28 DIAGNOSIS — Z510 Encounter for antineoplastic radiation therapy: Secondary | ICD-10-CM | POA: Diagnosis not present

## 2020-04-29 ENCOUNTER — Other Ambulatory Visit: Payer: Self-pay

## 2020-04-29 ENCOUNTER — Ambulatory Visit
Admission: RE | Admit: 2020-04-29 | Discharge: 2020-04-29 | Disposition: A | Discharge status: Home / Self Care | Payer: Medicare Other | Source: Ambulatory Visit | Attending: Radiation Oncology | Admitting: Radiation Oncology

## 2020-04-29 DIAGNOSIS — Z510 Encounter for antineoplastic radiation therapy: Secondary | ICD-10-CM | POA: Diagnosis not present

## 2020-04-30 ENCOUNTER — Ambulatory Visit
Admission: RE | Admit: 2020-04-30 | Discharge: 2020-04-30 | Disposition: A | Discharge status: Home / Self Care | Payer: Medicare Other | Source: Ambulatory Visit | Attending: Radiation Oncology | Admitting: Radiation Oncology

## 2020-04-30 ENCOUNTER — Other Ambulatory Visit: Payer: Self-pay

## 2020-04-30 DIAGNOSIS — Z510 Encounter for antineoplastic radiation therapy: Secondary | ICD-10-CM | POA: Diagnosis not present

## 2020-05-01 ENCOUNTER — Ambulatory Visit
Admission: RE | Admit: 2020-05-01 | Discharge: 2020-05-01 | Disposition: A | Discharge status: Home / Self Care | Payer: Medicare Other | Source: Ambulatory Visit | Attending: Radiation Oncology | Admitting: Radiation Oncology

## 2020-05-01 DIAGNOSIS — Z510 Encounter for antineoplastic radiation therapy: Secondary | ICD-10-CM | POA: Diagnosis not present

## 2020-05-02 ENCOUNTER — Other Ambulatory Visit: Payer: Self-pay

## 2020-05-02 ENCOUNTER — Ambulatory Visit
Admission: RE | Admit: 2020-05-02 | Discharge: 2020-05-02 | Disposition: A | Discharge status: Home / Self Care | Payer: Medicare Other | Source: Ambulatory Visit | Attending: Radiation Oncology | Admitting: Radiation Oncology

## 2020-05-02 DIAGNOSIS — Z510 Encounter for antineoplastic radiation therapy: Secondary | ICD-10-CM | POA: Diagnosis not present

## 2020-05-05 ENCOUNTER — Other Ambulatory Visit: Payer: Self-pay

## 2020-05-05 ENCOUNTER — Ambulatory Visit
Admission: RE | Admit: 2020-05-05 | Discharge: 2020-05-05 | Disposition: A | Discharge status: Home / Self Care | Payer: Medicare Other | Source: Ambulatory Visit | Attending: Radiation Oncology | Admitting: Radiation Oncology

## 2020-05-05 DIAGNOSIS — Z510 Encounter for antineoplastic radiation therapy: Secondary | ICD-10-CM | POA: Diagnosis not present

## 2020-05-06 ENCOUNTER — Ambulatory Visit
Admission: RE | Admit: 2020-05-06 | Discharge: 2020-05-06 | Disposition: A | Discharge status: Home / Self Care | Payer: Medicare Other | Source: Ambulatory Visit | Attending: Radiation Oncology | Admitting: Radiation Oncology

## 2020-05-06 DIAGNOSIS — Z510 Encounter for antineoplastic radiation therapy: Secondary | ICD-10-CM | POA: Diagnosis not present

## 2020-05-07 ENCOUNTER — Ambulatory Visit
Admission: RE | Admit: 2020-05-07 | Discharge: 2020-05-07 | Disposition: A | Discharge status: Home / Self Care | Payer: Medicare Other | Source: Ambulatory Visit | Attending: Radiation Oncology | Admitting: Radiation Oncology

## 2020-05-07 ENCOUNTER — Other Ambulatory Visit: Payer: Self-pay

## 2020-05-07 DIAGNOSIS — Z510 Encounter for antineoplastic radiation therapy: Secondary | ICD-10-CM | POA: Diagnosis not present

## 2020-05-08 ENCOUNTER — Ambulatory Visit
Admission: RE | Admit: 2020-05-08 | Discharge: 2020-05-08 | Disposition: A | Discharge status: Home / Self Care | Payer: Medicare Other | Source: Ambulatory Visit | Attending: Radiation Oncology | Admitting: Radiation Oncology

## 2020-05-08 DIAGNOSIS — Z510 Encounter for antineoplastic radiation therapy: Secondary | ICD-10-CM | POA: Diagnosis not present

## 2020-05-12 ENCOUNTER — Ambulatory Visit: Payer: Medicare Other

## 2020-05-13 ENCOUNTER — Ambulatory Visit
Admission: RE | Admit: 2020-05-13 | Discharge: 2020-05-13 | Disposition: A | Discharge status: Home / Self Care | Payer: Medicare Other | Source: Ambulatory Visit | Attending: Radiation Oncology | Admitting: Radiation Oncology

## 2020-05-13 DIAGNOSIS — Z510 Encounter for antineoplastic radiation therapy: Secondary | ICD-10-CM | POA: Diagnosis not present

## 2020-05-14 ENCOUNTER — Ambulatory Visit
Admission: RE | Admit: 2020-05-14 | Discharge: 2020-05-14 | Disposition: A | Discharge status: Home / Self Care | Payer: Medicare Other | Source: Ambulatory Visit | Attending: Radiation Oncology | Admitting: Radiation Oncology

## 2020-05-14 DIAGNOSIS — Z510 Encounter for antineoplastic radiation therapy: Secondary | ICD-10-CM | POA: Diagnosis not present

## 2020-05-15 ENCOUNTER — Other Ambulatory Visit: Payer: Self-pay

## 2020-05-15 ENCOUNTER — Other Ambulatory Visit: Payer: Self-pay | Admitting: Cardiology

## 2020-05-15 ENCOUNTER — Ambulatory Visit
Admission: RE | Admit: 2020-05-15 | Discharge: 2020-05-15 | Disposition: A | Discharge status: Home / Self Care | Payer: Medicare Other | Source: Ambulatory Visit | Attending: Radiation Oncology | Admitting: Radiation Oncology

## 2020-05-15 DIAGNOSIS — Z510 Encounter for antineoplastic radiation therapy: Secondary | ICD-10-CM | POA: Diagnosis not present

## 2020-05-19 ENCOUNTER — Ambulatory Visit
Admission: RE | Admit: 2020-05-19 | Discharge: 2020-05-19 | Disposition: A | Discharge status: Home / Self Care | Payer: Medicare Other | Source: Ambulatory Visit | Attending: Radiation Oncology | Admitting: Radiation Oncology

## 2020-05-19 DIAGNOSIS — C61 Malignant neoplasm of prostate: Secondary | ICD-10-CM | POA: Insufficient documentation

## 2020-05-19 DIAGNOSIS — Z510 Encounter for antineoplastic radiation therapy: Secondary | ICD-10-CM | POA: Insufficient documentation

## 2020-05-20 ENCOUNTER — Ambulatory Visit
Admission: RE | Admit: 2020-05-20 | Discharge: 2020-05-20 | Disposition: A | Discharge status: Home / Self Care | Payer: Medicare Other | Source: Ambulatory Visit | Attending: Radiation Oncology | Admitting: Radiation Oncology

## 2020-05-20 ENCOUNTER — Other Ambulatory Visit: Payer: Self-pay

## 2020-05-20 DIAGNOSIS — Z510 Encounter for antineoplastic radiation therapy: Secondary | ICD-10-CM | POA: Diagnosis not present

## 2020-05-21 ENCOUNTER — Ambulatory Visit
Admission: RE | Admit: 2020-05-21 | Discharge: 2020-05-21 | Disposition: A | Discharge status: Home / Self Care | Payer: Medicare Other | Source: Ambulatory Visit | Attending: Radiation Oncology | Admitting: Radiation Oncology

## 2020-05-21 ENCOUNTER — Other Ambulatory Visit: Payer: Self-pay

## 2020-05-21 DIAGNOSIS — Z510 Encounter for antineoplastic radiation therapy: Secondary | ICD-10-CM | POA: Diagnosis not present

## 2020-05-22 ENCOUNTER — Ambulatory Visit
Admission: RE | Admit: 2020-05-22 | Discharge: 2020-05-22 | Disposition: A | Discharge status: Home / Self Care | Payer: Medicare Other | Source: Ambulatory Visit | Attending: Radiation Oncology | Admitting: Radiation Oncology

## 2020-05-22 DIAGNOSIS — Z510 Encounter for antineoplastic radiation therapy: Secondary | ICD-10-CM | POA: Diagnosis not present

## 2020-05-23 ENCOUNTER — Ambulatory Visit
Admission: RE | Admit: 2020-05-23 | Discharge: 2020-05-23 | Disposition: A | Discharge status: Home / Self Care | Payer: Medicare Other | Source: Ambulatory Visit | Attending: Radiation Oncology | Admitting: Radiation Oncology

## 2020-05-23 DIAGNOSIS — Z510 Encounter for antineoplastic radiation therapy: Secondary | ICD-10-CM | POA: Diagnosis not present

## 2020-05-26 ENCOUNTER — Ambulatory Visit
Admission: RE | Admit: 2020-05-26 | Discharge: 2020-05-26 | Disposition: A | Discharge status: Home / Self Care | Payer: Medicare Other | Source: Ambulatory Visit | Attending: Radiation Oncology | Admitting: Radiation Oncology

## 2020-05-26 DIAGNOSIS — Z510 Encounter for antineoplastic radiation therapy: Secondary | ICD-10-CM | POA: Diagnosis not present

## 2020-05-27 ENCOUNTER — Ambulatory Visit
Admission: RE | Admit: 2020-05-27 | Discharge: 2020-05-27 | Disposition: A | Discharge status: Home / Self Care | Payer: Medicare Other | Source: Ambulatory Visit | Attending: Radiation Oncology | Admitting: Radiation Oncology

## 2020-05-27 DIAGNOSIS — Z510 Encounter for antineoplastic radiation therapy: Secondary | ICD-10-CM | POA: Diagnosis not present

## 2020-05-28 ENCOUNTER — Ambulatory Visit
Admission: RE | Admit: 2020-05-28 | Discharge: 2020-05-28 | Disposition: A | Discharge status: Home / Self Care | Payer: Medicare Other | Source: Ambulatory Visit | Attending: Radiation Oncology | Admitting: Radiation Oncology

## 2020-05-28 DIAGNOSIS — Z510 Encounter for antineoplastic radiation therapy: Secondary | ICD-10-CM | POA: Diagnosis not present

## 2020-05-29 ENCOUNTER — Ambulatory Visit
Admission: RE | Admit: 2020-05-29 | Discharge: 2020-05-29 | Disposition: A | Discharge status: Home / Self Care | Payer: Medicare Other | Source: Ambulatory Visit | Attending: Radiation Oncology | Admitting: Radiation Oncology

## 2020-05-29 DIAGNOSIS — Z510 Encounter for antineoplastic radiation therapy: Secondary | ICD-10-CM | POA: Diagnosis not present

## 2020-05-30 ENCOUNTER — Ambulatory Visit
Admission: RE | Admit: 2020-05-30 | Discharge: 2020-05-30 | Disposition: A | Discharge status: Home / Self Care | Payer: Medicare Other | Source: Ambulatory Visit | Attending: Radiation Oncology | Admitting: Radiation Oncology

## 2020-05-30 ENCOUNTER — Ambulatory Visit: Payer: Medicare Other

## 2020-05-30 DIAGNOSIS — Z510 Encounter for antineoplastic radiation therapy: Secondary | ICD-10-CM | POA: Diagnosis not present

## 2020-06-02 ENCOUNTER — Encounter: Payer: Self-pay | Admitting: Radiation Oncology

## 2020-06-02 ENCOUNTER — Ambulatory Visit
Admission: RE | Admit: 2020-06-02 | Discharge: 2020-06-02 | Disposition: A | Discharge status: Home / Self Care | Payer: Medicare Other | Source: Ambulatory Visit | Attending: Radiation Oncology | Admitting: Radiation Oncology

## 2020-06-02 ENCOUNTER — Other Ambulatory Visit: Payer: Self-pay

## 2020-06-02 ENCOUNTER — Encounter: Payer: Self-pay | Admitting: Medical Oncology

## 2020-06-02 DIAGNOSIS — Z510 Encounter for antineoplastic radiation therapy: Secondary | ICD-10-CM | POA: Diagnosis not present

## 2020-06-06 ENCOUNTER — Telehealth: Payer: Self-pay

## 2020-06-06 DIAGNOSIS — E7800 Pure hypercholesterolemia, unspecified: Secondary | ICD-10-CM

## 2020-06-06 NOTE — Telephone Encounter (Signed)
Called and lmomed pt to schedule lipid labs ordered the labs

## 2020-06-06 NOTE — Telephone Encounter (Signed)
-----   Message from Ramond Dial, [place] sent at 06/05/2020  3:53 PM EST -----  ----- Message ----- From: Ramond Dial, RPH-CPP Sent: 06/05/2020  12:00 AM EST To: Ramond Dial, RPH-CPP  Set up lipids

## 2020-06-08 DIAGNOSIS — Z20822 Contact with and (suspected) exposure to covid-19: Secondary | ICD-10-CM | POA: Diagnosis not present

## 2020-06-09 NOTE — Progress Notes (Signed)
  Radiation Oncology         (336) [phone] ________________________________  Name: Melvin Dunn MRN: [id number]  Date: 06/02/2020  DOB: 08-30-1950  End of Treatment Note  Diagnosis:   70 y.o. gentleman with Stage T1c adenocarcinoma of the prostate with Gleason Score of 4+3, and PSA of 10.6.     Indication for treatment:  Curative, Definitive Radiotherapy       Radiation treatment dates:   04/01/20-06/02/20  Site/dose:  1. The prostate, seminal vesicles, and pelvic lymph nodes were initially treated to 45 Gy in 25 fractions of 1.8 Gy  2. The prostate only was boosted to 75 Gy with 15 additional fractions of 2.0 Gy   Beams/energy:  1. The prostate, seminal vesicles, and pelvic lymph nodes were initially treated using VMAT intensity modulated radiotherapy delivering 6 megavolt photons. Image guidance was performed with CB-CT studies prior to each fraction. He was immobilized with a body fix lower extremity mold.  2. the prostate only was boosted using VMAT intensity modulated radiotherapy delivering 6 megavolt photons. Image guidance was performed with CB-CT studies prior to each fraction. He was immobilized with a body fix lower extremity mold.  Narrative: The patient tolerated radiation treatment relatively well.   The patient experienced some minor urinary irritation and modest fatigue.    Plan: The patient has completed radiation treatment. He will return to radiation oncology clinic for routine followup in one month. I advised him to call or return sooner if he has any questions or concerns related to his recovery or treatment. ________________________________  Sheral Apley. Tammi Klippel, M.D.

## 2020-06-30 ENCOUNTER — Other Ambulatory Visit: Payer: Self-pay

## 2020-06-30 ENCOUNTER — Other Ambulatory Visit: Payer: Medicare Other | Admitting: *Deleted

## 2020-06-30 ENCOUNTER — Telehealth: Payer: Self-pay

## 2020-06-30 DIAGNOSIS — E7800 Pure hypercholesterolemia, unspecified: Secondary | ICD-10-CM

## 2020-06-30 LAB — LIPID PANEL
Chol/HDL Ratio: 4.9 ratio (ref 0.0–5.0)
Cholesterol, Total: 141 mg/dL (ref 100–199)
HDL: 29 mg/dL — ABNORMAL LOW (ref >39)
LDL Chol Calc (NIH): 67 mg/dL (ref 0–99)
Triglycerides: 279 mg/dL — ABNORMAL HIGH (ref 0–149)
VLDL Cholesterol Cal: 45 mg/dL — ABNORMAL HIGH (ref 5–40)

## 2020-06-30 NOTE — Telephone Encounter (Signed)
Called patient in regards to telephone visit with Freeman Caldron PA on 07/09/20 @ 9:00am. Call was disconnected. Tried to call back and went to voicemail. TM

## 2020-07-01 ENCOUNTER — Ambulatory Visit (HOSPITAL_COMMUNITY): Payer: Medicare Other | Attending: Cardiovascular Disease

## 2020-07-01 DIAGNOSIS — I350 Nonrheumatic aortic (valve) stenosis: Secondary | ICD-10-CM | POA: Insufficient documentation

## 2020-07-01 LAB — ECHOCARDIOGRAM COMPLETE
AR max vel: 1.13 cm2
AV Area VTI: 1.28 cm2
AV Area mean vel: 1.07 cm2
AV Mean grad: 26.7 mmHg
AV Peak grad: 52.8 mmHg
Ao pk vel: 3.63 m/s
Area-P 1/2: 2.59 cm2
P 1/2 time: 413 msec
S' Lateral: 2.3 cm

## 2020-07-02 ENCOUNTER — Telehealth: Payer: Self-pay | Admitting: Pharmacist

## 2020-07-02 NOTE — Telephone Encounter (Signed)
TG still up despite fenofibrate. Will confirm with patient he is still taking. Can consider increasing dose. He is taking fenofibrate On hormone therapy for his prostate cancer which is most likely causing some elevation in his TG. Was on the phone with Melvin Dunn discussing when we got disconnected. I called pt back but it went to VM. Left VM.

## 2020-07-03 MED ORDER — FENOFIBRATE 145 MG PO TABS: 145.0000 mg | ORAL_TABLET | Freq: Every day | ORAL | 3 refills | Status: DC

## 2020-07-03 NOTE — Telephone Encounter (Signed)
    Pt is returning Melissa's call, he said to call his work number with ext. [phone]

## 2020-07-03 NOTE — Telephone Encounter (Signed)
Spoke with Melvin Dunn. He is in agreement to increase fenofibrate. Will increase to 145mg  daily. Recheck lipids in 3 months.

## 2020-07-07 ENCOUNTER — Telehealth: Payer: Self-pay

## 2020-07-07 ENCOUNTER — Encounter: Payer: Self-pay | Admitting: Urology

## 2020-07-07 NOTE — Progress Notes (Signed)
Patient has telephone appointment with Ashlyn Bruning. Patient states nocturia 2 times per night. Patient states moderate dysuria. Patient denies hematuria. Patient states regular bowel movements. Patient states urine stream is moderate and steady. Patient states that he is emptying his bladder completely. Patient states urgency and is able to hold his urine. Patient denies pushing during urination. Patient denies fatigue. Patient denies leakage. Patient states that he is currently not taking his Flomax because it make his dizzy. Patient states that he is awaiting a call from [facility] for a follow-up appointment.

## 2020-07-07 NOTE — Telephone Encounter (Signed)
Spoke with patient in regards telephone follow-up with Ashlyn Bruning  07/09/20 @ 9:00. Patient verbalized understanding of appointment date and time. Reviewed medications, AUA and prostate questions.

## 2020-07-09 ENCOUNTER — Other Ambulatory Visit: Payer: Self-pay

## 2020-07-09 ENCOUNTER — Ambulatory Visit
Admission: RE | Admit: 2020-07-09 | Discharge: 2020-07-09 | Disposition: A | Discharge status: Home / Self Care | Payer: Medicare Other | Source: Ambulatory Visit | Attending: Urology | Admitting: Urology

## 2020-07-09 DIAGNOSIS — C61 Malignant neoplasm of prostate: Secondary | ICD-10-CM

## 2020-07-09 NOTE — Progress Notes (Signed)
Radiation Oncology         (336) [phone] ________________________________  Name: Melvin Dunn MRN: [id number]  Date: 07/09/2020  DOB: 11-14-1950  Post Treatment Note  CC: Shirline Frees, MD  Kathie Rhodes, MD  Diagnosis:   70 y.o.gentleman with Stage T1cadenocarcinoma of the prostate with Gleason Score of 4+3, and PSA of10.6.    Interval Since Last Radiation:  5.5 weeks, concurrent with ADT (37-month Eligard given 01/23/2020) 04/01/20-06/02/20: 1. The prostate, seminal vesicles, and pelvic lymph nodes were initially treated to 45 Gy in 25 fractions of 1.8 Gy  2. The prostate only was boosted to 75 Gy with 15 additional fractions of 2.0 Gy   Narrative:  I spoke with the patient to conduct his routine scheduled 1 month follow up visit via telephone to spare the patient unnecessary potential exposure in the healthcare setting during the current COVID-19 pandemic.  The patient was notified in advance and gave permission to proceed with this visit format.  He tolerated radiation treatment relatively well with only minor urinary irritation and modest fatigue.                                 On review of systems, the patient states that he is doing well in general.  His current IPSS score is 2, indicating mild residual urinary symptoms with nocturia x2 per night.  He continues with moderate, persistent dysuria present throughout the entire stream.  This is not worsening but does not seem to be improving over the past 2 months.  He does have a history of bulbar urethral stricture requiring dilation in 04/05/2020, prior to starting radiotherapy.  He denies any gross hematuria, fever, chills or night sweats.  He reports a healthy appetite and is maintaining his weight.  He denies abdominal pain, nausea, vomiting, diarrhea or constipation.  He is continued to tolerate the ADT well, with only mild to moderate fatigue.  Overall, he is quite pleased with his progress to date.  ALLERGIES:  is allergic to  lidocaine.  Meds: Current Outpatient Medications  Medication Sig Dispense Refill  . atorvastatin (LIPITOR) 80 MG tablet TAKE 1 TABLET BY MOUTH EVERYDAY AT BEDTIME 90 tablet 2  . Bempedoic Acid-Ezetimibe (NEXLIZET) 180-10 MG TABS Take 1 tablet by mouth daily. (Patient taking differently: Take 1 tablet by mouth at bedtime.) 30 tablet 11  . Cinnamon 500 MG capsule Take 1,000 mg by mouth daily. Take 2 Capsule Equal 1000 IU    . fenofibrate (TRICOR) 145 MG tablet Take 1 tablet (145 mg total) by mouth daily. 90 tablet 3  . Garlic (GARLIQUE PO) Take by mouth.    Javier Docker Oil 1000 MG CAPS Take by mouth.    . levocetirizine (XYZAL) 5 MG tablet Take 5 mg by mouth at bedtime as needed.    . metoprolol tartrate (LOPRESSOR) 50 MG tablet Take 50 mg by mouth 2 (two) times daily.    . nitroGLYCERIN (NITROSTAT) 0.4 MG SL tablet Place 1 tablet (0.4 mg total) under the tongue every 5 (five) minutes as needed for chest pain. 25 tablet 3  . phenazopyridine (PYRIDIUM) 200 MG tablet Take 1 tablet (200 mg total) by mouth 3 (three) times daily as needed for pain. 20 tablet 0  . albuterol (VENTOLIN HFA) 108 (90 Base) MCG/ACT inhaler Inhale 1-2 puffs into the lungs daily as needed for wheezing or shortness of breath. (Patient not taking: No sig reported)    .  fluticasone (FLONASE) 50 MCG/ACT nasal spray Place 2 sprays into both nostrils at bedtime. (Patient not taking: No sig reported)     No current facility-administered medications for this encounter.   Facility-Administered Medications Ordered in Other Encounters  Medication Dose Route Frequency Provider Last Rate Last Admin  . sodium phosphate (FLEET) 7-19 GM/118ML enema 1 enema  1 enema Rectal Once Raynelle Bring, MD        Physical Findings:  vitals were not taken for this visit.   Karen Kays to assess due to telephone follow-up visit format.  Lab Findings: Lab Results  Component Value Date   WBC 12.8 (H) 08/11/2017   HGB 14.6 03/17/2020   HCT 43.0  03/17/2020   MCV 84.0 08/11/2017   PLT 251 08/11/2017     Radiographic Findings: ECHOCARDIOGRAM COMPLETE  Result Date: 07/01/2020    ECHOCARDIOGRAM REPORT   Patient Name:   Melvin Dunn   Date of Exam: 07/01/2020 Medical Rec #:  [id number]     Height:       66.0 in Accession #:    [id number]    Weight:       160.7 lb Date of Birth:  1950-09-23    BSA:          1.822 m Patient Age:    38 years      BP:           148/80 mmHg Patient Gender: M             HR:           69 bpm. Exam Location:  [facility] Procedure: 2D Echo, 3D Echo, Cardiac Doppler and Color Doppler Indications:    I35.0 Aortic Stenosis  History:        Patient has prior history of Echocardiogram examinations, most                 recent 07/03/2019. CAD, Prior CABG, Aortic Valve Disease; Risk                 Factors:Hypertension and Family History of Coronary Artery                 Disease. Aortic Stenosis (prior mean gradient 57mmHG).  Sonographer:    Deliah Boston RDCS Referring Phys: [place]  1. Left ventricular ejection fraction, by estimation, is 65 to 70%. The left ventricle has normal function. The left ventricle has no regional wall motion abnormalities. Left ventricular diastolic parameters are indeterminate.  2. Right ventricular systolic function is normal. The right ventricular size is normal. There is normal pulmonary artery systolic pressure.  3. The mitral valve is grossly normal. Trivial mitral valve regurgitation.  4. The aortic valve is calcified. There is moderate calcification of the aortic valve. There is moderate thickening of the aortic valve. Aortic valve regurgitation is trivial. Moderate aortic valve stenosis. FINDINGS  Left Ventricle: Left ventricular ejection fraction, by estimation, is 65 to 70%. The left ventricle has normal function. The left ventricle has no regional wall motion abnormalities. The left ventricular internal cavity size was normal in size. There is  no left ventricular  hypertrophy. Left ventricular diastolic parameters are indeterminate. Right Ventricle: The right ventricular size is normal. No increase in right ventricular wall thickness. Right ventricular systolic function is normal. There is normal pulmonary artery systolic pressure. The tricuspid regurgitant velocity is 2.45 m/s, and  with an assumed right atrial pressure of 3 mmHg, the estimated right ventricular systolic pressure  is 27.0 mmHg. Left Atrium: Left atrial size was normal in size. Right Atrium: Right atrial size was normal in size. Pericardium: There is no evidence of pericardial effusion. Mitral Valve: The mitral valve is grossly normal. Trivial mitral valve regurgitation. Tricuspid Valve: The tricuspid valve is grossly normal. Tricuspid valve regurgitation is trivial. Aortic Valve: The aortic valve is calcified. There is moderate calcification of the aortic valve. There is moderate thickening of the aortic valve. Aortic valve regurgitation is trivial. Aortic regurgitation PHT measures 413 msec. Moderate aortic stenosis is present. Aortic valve mean gradient measures 26.7 mmHg. Aortic valve peak gradient measures 52.8 mmHg. Aortic valve area, by VTI measures 1.28 cm. Pulmonic Valve: The pulmonic valve was grossly normal. Pulmonic valve regurgitation is not visualized. Aorta: The aortic root and ascending aorta are structurally normal, with no evidence of dilitation. IAS/Shunts: The atrial septum is grossly normal.  LEFT VENTRICLE PLAX 2D LVIDd:         4.60 cm  Diastology LVIDs:         2.30 cm  LV e' medial:    8.27 cm/s LV PW:         1.00 cm  LV E/e' medial:  10.3 LV IVS:        0.80 cm  LV e' lateral:   9.14 cm/s LVOT diam:     2.40 cm  LV E/e' lateral: 9.3 LV SV:         98 LV SV Index:   54 LVOT Area:     4.52 cm                          3D Volume EF:                         3D EF:        65 %                         LV EDV:       128 ml                         LV ESV:       44 ml                          LV SV:        84 ml RIGHT VENTRICLE RV S prime:     8.70 cm/s TAPSE (M-mode): 1.2 cm LEFT ATRIUM             Index       RIGHT ATRIUM           Index LA diam:        4.10 cm 2.25 cm/m  RA Area:     14.70 cm LA Vol (A2C):   54.5 ml 29.91 ml/m RA Volume:   33.90 ml  18.60 ml/m LA Vol (A4C):   36.4 ml 19.98 ml/m LA Biplane Vol: 45.0 ml 24.69 ml/m  AORTIC VALVE AV Area (Vmax):    1.13 cm AV Area (Vmean):   1.07 cm AV Area (VTI):     1.28 cm AV Vmax:           363.33 cm/s AV Vmean:          237.667 cm/s AV VTI:  0.769 m AV Peak Grad:      52.8 mmHg AV Mean Grad:      26.7 mmHg LVOT Vmax:         90.80 cm/s LVOT Vmean:        56.300 cm/s LVOT VTI:          0.217 m LVOT/AV VTI ratio: 0.28 AI PHT:            413 msec  AORTA Ao Root diam: 3.60 cm Ao Asc diam:  3.50 cm MITRAL VALVE               TRICUSPID VALVE MV Area (PHT)  cm         TR Peak grad:   24.0 mmHg MV Decel Time: 293 msec    TR Vmax:        245.00 cm/s MV E velocity: 85.30 cm/s MV A velocity: 74.10 cm/s  SHUNTS MV E/A ratio:  1.15        Systemic VTI:  0.22 m                            Systemic Diam: 2.40 cm Mertie Moores MD Electronically signed by Mertie Moores MD Signature Date/Time: 07/01/2020/5:38:34 PM    Final     Impression/Plan: 1. 70 y.o.gentleman with Stage T1cadenocarcinoma of the prostate with Gleason Score of 4+3, and PSA of10.6.  He will continue to follow up with urology for ongoing PSA determinations but does not currently have a follow-up appointment scheduled with Dr. Alinda Money to his knowledge. He understands what to expect with regards to PSA monitoring going forward.  He continues to tolerate the ADT fairly well and questioned the duration of time that he would continue this therapy. I advised that it could be as little as 6 months or as long as 2 years but would be at the discretion of Dr. Alinda Money.  I will look forward to following his response to treatment via correspondence with urology, and would be happy to  continue to participate in his care if clinically indicated. I talked to the patient about what to expect in the future, including his risk for erectile dysfunction and rectal bleeding. I encouraged him to call or return to the office if he has any questions regarding his previous radiation or possible radiation side effects. He was comfortable with this plan and will follow up as needed.      Nicholos Johns, PA-C

## 2020-07-18 ENCOUNTER — Other Ambulatory Visit: Payer: Self-pay | Admitting: Cardiology

## 2020-07-18 MED ORDER — NEXLIZET 180-10 MG PO TABS
1.0000 | ORAL_TABLET | Freq: Every day | ORAL | 6 refills | Status: DC
Start: 2020-07-18 — End: 2021-03-02

## 2020-07-30 DIAGNOSIS — N35011 Post-traumatic bulbous urethral stricture: Secondary | ICD-10-CM | POA: Diagnosis not present

## 2020-07-30 DIAGNOSIS — Z5111 Encounter for antineoplastic chemotherapy: Secondary | ICD-10-CM | POA: Diagnosis not present

## 2020-08-12 DIAGNOSIS — E1169 Type 2 diabetes mellitus with other specified complication: Secondary | ICD-10-CM | POA: Diagnosis not present

## 2020-08-12 DIAGNOSIS — E7800 Pure hypercholesterolemia, unspecified: Secondary | ICD-10-CM | POA: Diagnosis not present

## 2020-08-13 DIAGNOSIS — I350 Nonrheumatic aortic (valve) stenosis: Secondary | ICD-10-CM | POA: Diagnosis not present

## 2020-08-13 DIAGNOSIS — J4520 Mild intermittent asthma, uncomplicated: Secondary | ICD-10-CM | POA: Diagnosis not present

## 2020-08-13 DIAGNOSIS — I2510 Atherosclerotic heart disease of native coronary artery without angina pectoris: Secondary | ICD-10-CM | POA: Diagnosis not present

## 2020-08-13 DIAGNOSIS — E1169 Type 2 diabetes mellitus with other specified complication: Secondary | ICD-10-CM | POA: Diagnosis not present

## 2020-08-13 DIAGNOSIS — E7800 Pure hypercholesterolemia, unspecified: Secondary | ICD-10-CM | POA: Diagnosis not present

## 2020-08-13 DIAGNOSIS — D508 Other iron deficiency anemias: Secondary | ICD-10-CM | POA: Diagnosis not present

## 2020-08-13 DIAGNOSIS — Z0000 Encounter for general adult medical examination without abnormal findings: Secondary | ICD-10-CM | POA: Diagnosis not present

## 2020-08-26 DIAGNOSIS — D509 Iron deficiency anemia, unspecified: Secondary | ICD-10-CM | POA: Diagnosis not present

## 2020-09-25 ENCOUNTER — Telehealth: Payer: Self-pay

## 2020-09-25 DIAGNOSIS — E7800 Pure hypercholesterolemia, unspecified: Secondary | ICD-10-CM

## 2020-09-25 NOTE — Telephone Encounter (Signed)
lmom for labs 

## 2020-09-25 NOTE — Telephone Encounter (Signed)
-----   Message from Ramond Dial, [place] sent at 09/25/2020  7:36 AM EDT -----  ----- Message ----- From: Ramond Dial, RPH-CPP Sent: 09/25/2020  12:00 AM EDT To: Ramond Dial, RPH-CPP  Set up lipids ----- Message ----- From: Ramond Dial, RPH-CPP Sent: 07/01/2020  12:00 AM EST To: Ramond Dial, RPH-CPP  lipids ----- Message ----- From: Allean Found, CMA Sent: 06/06/2020   9:03 AM EST To: Ramond Dial, RPH-CPP  Labs ordered but lmom pt to call and schedule ----- Message ----- From: Ramond Dial, RPH-CPP Sent: 06/05/2020   3:53 PM EST To: Allean Found, CMA   ----- Message ----- From: Ramond Dial, RPH-CPP Sent: 06/05/2020  12:00 AM EST To: Ramond Dial, RPH-CPP  Set up lipids

## 2020-09-26 NOTE — Addendum Note (Signed)
Addended by: Marcelle Overlie D on: 09/26/2020 09:25 AM   Modules accepted: Orders

## 2020-10-02 ENCOUNTER — Other Ambulatory Visit: Payer: Self-pay

## 2020-10-02 ENCOUNTER — Other Ambulatory Visit: Payer: Medicare Other

## 2020-10-02 DIAGNOSIS — E7800 Pure hypercholesterolemia, unspecified: Secondary | ICD-10-CM | POA: Diagnosis not present

## 2020-10-02 LAB — LIPID PANEL
Chol/HDL Ratio: 19.5 ratio — ABNORMAL HIGH (ref 0.0–5.0)
Cholesterol, Total: 117 mg/dL (ref 100–199)
HDL: 6 mg/dL — ABNORMAL LOW (ref >39)
LDL Chol Calc (NIH): 47 mg/dL (ref 0–99)
Triglycerides: 427 mg/dL — ABNORMAL HIGH (ref 0–149)
VLDL Cholesterol Cal: 64 mg/dL — ABNORMAL HIGH (ref 5–40)

## 2020-10-03 ENCOUNTER — Telehealth: Payer: Self-pay | Admitting: Pharmacist

## 2020-10-03 MED ORDER — OMEGA-3-ACID ETHYL ESTERS 1 G PO CAPS: 2.0000 g | ORAL_CAPSULE | Freq: Two times a day (BID) | ORAL | 11 refills | Status: DC

## 2020-10-03 NOTE — Telephone Encounter (Signed)
TG increased despite increasing fenofibrate dose. Vascepa is cost prohibitive. His elevated TG are more likely a combo of his DM but also his hormone therapy for prostate cancer. Already on high intensity statin and Nexlizet. Labs were fasting. He is taking OTC krill oil EPA 64mg /DHA 34mg  per capsule- taking one per day. We know that Dalene Seltzer is cost prohibitive. I will send in Rx for Lovaza and see if this is more affordable. We also discussed cutting back on sugars and carbs. I will call him on Tuesday to see if Lovaza was affordable.

## 2020-10-07 NOTE — Telephone Encounter (Signed)
Spoke to patient who states that generic lovaza was $90/month. This is no affordable. Patient will take OTC fish oil aiming to have DHA/EPA of at least 1000mg - aiming more towards goal of 4g/day.

## 2020-10-07 NOTE — Addendum Note (Signed)
Addended by: Marcelle Overlie D on: 10/07/2020 01:20 PM   Modules accepted: Orders

## 2020-10-09 ENCOUNTER — Other Ambulatory Visit: Payer: Medicare Other

## 2020-12-02 ENCOUNTER — Other Ambulatory Visit: Payer: Self-pay | Admitting: Cardiology

## 2021-01-19 DIAGNOSIS — H524 Presbyopia: Secondary | ICD-10-CM | POA: Diagnosis not present

## 2021-02-28 ENCOUNTER — Other Ambulatory Visit: Payer: Self-pay | Admitting: Cardiology

## 2021-03-01 ENCOUNTER — Other Ambulatory Visit: Payer: Self-pay | Admitting: Cardiovascular Disease

## 2021-03-19 DIAGNOSIS — E7800 Pure hypercholesterolemia, unspecified: Secondary | ICD-10-CM | POA: Diagnosis not present

## 2021-03-19 DIAGNOSIS — E1169 Type 2 diabetes mellitus with other specified complication: Secondary | ICD-10-CM | POA: Diagnosis not present

## 2021-03-19 DIAGNOSIS — I350 Nonrheumatic aortic (valve) stenosis: Secondary | ICD-10-CM | POA: Diagnosis not present

## 2021-03-19 DIAGNOSIS — D508 Other iron deficiency anemias: Secondary | ICD-10-CM | POA: Diagnosis not present

## 2021-03-19 DIAGNOSIS — I2510 Atherosclerotic heart disease of native coronary artery without angina pectoris: Secondary | ICD-10-CM | POA: Diagnosis not present

## 2021-03-27 ENCOUNTER — Other Ambulatory Visit: Payer: Self-pay | Admitting: Cardiovascular Disease

## 2021-04-01 ENCOUNTER — Other Ambulatory Visit: Payer: Self-pay | Admitting: Cardiology

## 2021-04-01 MED ORDER — NEXLIZET 180-10 MG PO TABS: 1.0000 | ORAL_TABLET | Freq: Every day | ORAL | 0 refills | Status: DC

## 2021-04-05 ENCOUNTER — Other Ambulatory Visit: Payer: Self-pay | Admitting: Cardiology

## 2021-04-14 DIAGNOSIS — R3914 Feeling of incomplete bladder emptying: Secondary | ICD-10-CM | POA: Diagnosis not present

## 2021-04-16 ENCOUNTER — Telehealth: Payer: Self-pay | Admitting: Cardiology

## 2021-04-16 MED ORDER — NEXLIZET 180-10 MG PO TABS: 1.0000 | ORAL_TABLET | Freq: Every day | ORAL | 0 refills | Status: DC

## 2021-04-16 NOTE — Telephone Encounter (Signed)
*  STAT* If patient is at the pharmacy, call can be transferred to refill team.   1. Which medications need to be refilled? (please list name of each medication and dose if known) Bempedoic Acid-Ezetimibe (NEXLIZET) 180-10 MG TABS  2. Which pharmacy/location (including street and city if local pharmacy) is medication to be sent to?  [id number] - [place], [place].  3. Do they need a 30 day or 90 day supply? [place]

## 2021-04-16 NOTE — Telephone Encounter (Signed)
Pt's medication was sent to pt's pharmacy as requested. Confirmation received.  °

## 2021-04-18 ENCOUNTER — Other Ambulatory Visit: Payer: Self-pay | Admitting: Cardiology

## 2021-04-29 NOTE — Progress Notes (Signed)
Office Visit    Patient Name: Melvin Dunn Date of Encounter: 04/30/2021  Primary Care Provider:  Shirline Frees, MD Primary Cardiologist:  Fransico Him, MD  Chief Complaint    70 year old male with a history of CAD s/p CABG x4, grade 1 diastolic dysfunction, moderate AS, hypertension, hyperlipidemia, type 2 diabetes, and prostate cancer who presents for follow-up related to CAD, hyperlipidemia and aortic stenosis.  Past Medical History    Past Medical History:  Diagnosis Date   Adenocarcinoma ([place])    of the prostate, 5% of single core 01/2011-watching and waiting for now   Aortic stenosis 02/11/2014   moderate by echo 06/2019  with Aortic valve mean gradient measures 28.5 mmHg. Aortic valve Vmax measures 3.64 m/s.   Asthma    no current inhaler use   CAD (coronary artery disease), native coronary artery    LIMA to LAD, SVG to OM1, SVG to RCA-RPL, EVH via right thigh and leg   Colon polyp    Environmental allergies    History of kidney stones    Hyperlipidemia    Pre-diabetes    Prostate cancer ([place])    Seizure ([place])    due to lidocaine allergy with prostate biopsy this year   Past Surgical History:  Procedure Laterality Date   CORONARY ARTERY BYPASS GRAFT N/A 02/13/2014   Procedure: CORONARY ARTERY BYPASS GRAFTING (CABG) TIMES FOUR USING LEFT INTERNAL MAMMARY ARTERY AND RIGHT SAPHENOUS LEG VEIN HARVESTED ENDOSCOPICALLY;  Surgeon: Rexene Alberts, MD;  Location: [facility];  Service: Open Heart Surgery;  Laterality: N/A;   CYSTOSCOPY WITH LITHOLAPAXY N/A 03/17/2020   Procedure: CYSTOSCOPY WITH LITHOLAPAXY;  Surgeon: Raynelle Bring, MD;  Location: [facility];  Service: Urology;  Laterality: N/A;   cytoscopy  15 yrs ago   [place] LITHOTRIPSY  15 yrs ago   EYE SURGERY Bilateral 2015   cataracts   GOLD SEED IMPLANT N/A 03/17/2020   Procedure: GOLD SEED IMPLANT;  Surgeon: Raynelle Bring, MD;  Location: [facility];  Service:  Urology;  Laterality: N/A;   INTRAOPERATIVE TRANSESOPHAGEAL ECHOCARDIOGRAM N/A 02/13/2014   Procedure: INTRAOPERATIVE TRANSESOPHAGEAL ECHOCARDIOGRAM;  Surgeon: Rexene Alberts, MD;  Location: [facility];  Service: Open Heart Surgery;  Laterality: N/A;   LEFT HEART CATHETERIZATION WITH CORONARY ANGIOGRAM N/A 02/12/2014   Procedure: LEFT HEART CATHETERIZATION WITH CORONARY ANGIOGRAM;  Surgeon: Blane Ohara, MD;  Location: [facility] CATH LAB;  Service: Cardiovascular;  Laterality: N/A;   left leg surgery for fracture  2004   plates inserted   PROSTATE BIOPSY  06/2019   SPACE OAR INSTILLATION N/A 03/17/2020   Procedure: SPACE OAR INSTILLATION;  Surgeon: Raynelle Bring, MD;  Location: [facility];  Service: Urology;  Laterality: N/A;    Allergies  Allergies  Allergen Reactions   Lidocaine     Seizure with biopsy with lidocaine this year    History of Present Illness    70 year old male with the above past medical history including CAD s/p CABG x4, grade 1 diastolic dysfunction, moderate AS, hypertension, hyperlipidemia, type 2 diabetes, and prostate cancer.  He was last seen in the office in February 08, 2020 for preop clearance in setting of prostate cancer and was doing well overall from a cardiac standpoint. Repeat echocardiogram on 06/2020 showed EF 65 to 70%, trivial MR and AR, with moderate AS. Repeat echo recommended in 1 year. He follow with the lipid clinic with assistance in the management of elevated triglycerides.  He presents today for routine follow-up.  Since his last visit he has done well overall from a cardiac standpoint. He reports mild stable dyspnea with moderate activity that is unchanged since his CABG. he denies any symptoms of angina. He expresses some frustration with his management of hyperlipidemia. He has struggled with cost of medications and does not want to self administer injections.  He has been taking only 2000 mg of fish oil daily.  He continues to work  full-time.  He has no concerns today.  Home Medications    Current Outpatient Medications  Medication Sig Dispense Refill   albuterol (VENTOLIN HFA) 108 (90 Base) MCG/ACT inhaler Inhale 1-2 puffs into the lungs daily as needed for wheezing or shortness of breath.     aspirin 81 MG chewable tablet Chew 1 tablet by mouth daily.     atorvastatin (LIPITOR) 80 MG tablet TAKE 1 TABLET BY MOUTH EVERY DAY. Please keep upcoming appt in December 2022 with Cardiologist before anymore refills. Thank you 30 tablet 0   Bempedoic Acid-Ezetimibe (NEXLIZET) 180-10 MG TABS Take 1 tablet by mouth daily. Please keep upcoming appt with  Cardiologist in December 2022 before anymore refills Thank you Final attempt 30 tablet 0   fenofibrate (TRICOR) 145 MG tablet Take 1 tablet (145 mg total) by mouth daily. 90 tablet 3   fluticasone (FLONASE) 50 MCG/ACT nasal spray Place 2 sprays into both nostrils at bedtime.     Garlic (GARLIQUE PO) Take by mouth.     levocetirizine (XYZAL) 5 MG tablet Take 5 mg by mouth at bedtime as needed.     metoprolol tartrate (LOPRESSOR) 50 MG tablet Take 50 mg by mouth 2 (two) times daily.     nitroGLYCERIN (NITROSTAT) 0.4 MG SL tablet Place 1 tablet (0.4 mg total) under the tongue every 5 (five) minutes as needed for chest pain. 25 tablet 3   Omega 3 1000 MG CAPS Take 4 capsules (4,000 mg total) by mouth daily.     phenazopyridine (PYRIDIUM) 200 MG tablet Take 1 tablet (200 mg total) by mouth 3 (three) times daily as needed for pain. 20 tablet 0   No current facility-administered medications for this visit.   Facility-Administered Medications Ordered in Other Visits  Medication Dose Route Frequency Provider Last Rate Last Admin   sodium phosphate (FLEET) 7-19 GM/118ML enema 1 enema  1 enema Rectal Once Raynelle Bring, MD         Review of Systems   He denies chest pain, palpitations, dyspnea, pnd, orthopnea, n, v, dizziness, syncope, edema, weight gain, or early satiety. All other  systems reviewed and are otherwise negative except as noted above.   Physical Exam    VS:  BP (!) 142/66 (BP Location: Left Arm, Patient Position: Sitting, Cuff Size: Normal)    Pulse 71    Ht 5\' 6"  (1.676 m)    Wt 165 lb 11.2 oz (75.2 kg)    BMI 26.74 kg/m  GEN: Well nourished, well developed, in no acute distress. HEENT: normal. Neck: Supple, no JVD, carotid bruits, or masses. Cardiac: RRR, 2/6 systolic murmur, no rubs, or gallops. No clubbing, cyanosis, edema.  Radials/DP/PT 2+ and equal bilaterally.  Respiratory:  Respirations regular and unlabored, clear to auscultation bilaterally. GI: Soft, nontender, nondistended, BS + x 4. MS: no deformity or atrophy. Skin: warm and dry, no rash. Neuro:  Strength and sensation are intact. Psych: Normal affect.  Accessory Clinical Findings    ECG personally reviewed by me today -  NSR, 71 bpm - no acute changes.  Lab Results  Component Value Date   WBC 12.8 (H) 08/11/2017   HGB 14.6 03/17/2020   HCT 43.0 03/17/2020   MCV 84.0 08/11/2017   PLT 251 08/11/2017   Lab Results  Component Value Date   CREATININE 1.00 03/17/2020   BUN 26 (H) 03/17/2020   NA 142 03/17/2020   K 4.5 03/17/2020   CL 105 03/17/2020   CO2 26 11/11/2016   Lab Results  Component Value Date   ALT 39 07/04/2019   AST 30 01/04/2019   ALKPHOS 85 01/04/2019   BILITOT 0.9 01/04/2019   Lab Results  Component Value Date   CHOL 117 10/02/2020   HDL 6 (L) 10/02/2020   LDLCALC 47 10/02/2020   TRIG 427 (H) 10/02/2020   CHOLHDL 19.5 (H) 10/02/2020    Lab Results  Component Value Date   HGBA1C 6.3 (H) 12/19/2019    Assessment & Plan   1. CAD:  s/p CABG x4. Stable with no anginal symptoms.  Chronic mild dyspnea with moderate exertion in the setting of chronic diastolic heart failure and moderate AS. This is unchanged since the time of his CABG. No need for ischemic evaluation at this time. Continue metoprolol tartrate, Lipitor 80 mg daily, Lovaza, bempedoic  acid-zetia, and fenofibrate.   2. Chronic diastolic heart failure/grade 1 diastolic dysfunction: Noted on prior echo, improved on most recent echo. No indication for loop diuretic at this time.   3. Moderate AS: Most recent echo of 06/2020 showed moderate AS. He is generally asymptomatic with mild DOE as above. Will schedule repeat echo for February 2023.   4. Hypertension: BP elevated in office today 142/66.  He has not checked his blood pressure at home. Encouraged him to check his blood pressure and report readings consistently greater than 130/80. He remains elevated could consider addition of ARB.  Continue current antihypertensive regimen.   5. Hyperlipidemia LDL goal <70: Lipid paned 5/22 showed LDL 47, triglycerides of 427. He takes fenofibrate, dose was increased and he was started on generic fish oil, as Vascepa and Lovaza proved to be cost prohibitive.  He is also finding his bempedoic acid-ezetimibe to be expensive.  I referred him to the [employer] foundation for possible financial assistance.  He has only been taking 2000 mg of fish oil daily as opposed to the recommended 4000 mg per pharmacy. He is not interested in self administering injections. Will have him return for repeat fasting lipids, LFTs.  If his numbers remain elevated, we will reach out to lipid clinic pharmacy for additional recommendations. For now, continue Lipitor 80 mg daily, fish oil, bempedoic acid-zetia, and fenofibrate.   6. Disposition: Repeat echo in February 2023.  Repeat lipids.  Follow-up in 3 to 4 months with Dr. Radford Pax.  Lenna Sciara, NP 04/30/2021, 5:21 PM

## 2021-04-30 ENCOUNTER — Encounter (HOSPITAL_BASED_OUTPATIENT_CLINIC_OR_DEPARTMENT_OTHER): Payer: Self-pay | Admitting: Nurse Practitioner

## 2021-04-30 ENCOUNTER — Ambulatory Visit (HOSPITAL_BASED_OUTPATIENT_CLINIC_OR_DEPARTMENT_OTHER): Payer: Medicare Other | Admitting: Nurse Practitioner

## 2021-04-30 ENCOUNTER — Other Ambulatory Visit: Payer: Self-pay

## 2021-04-30 VITALS — BP 142/66 | HR 71 | Ht 66.0 in | Wt 165.7 lb

## 2021-04-30 DIAGNOSIS — E785 Hyperlipidemia, unspecified: Secondary | ICD-10-CM

## 2021-04-30 DIAGNOSIS — I5032 Chronic diastolic (congestive) heart failure: Secondary | ICD-10-CM

## 2021-04-30 DIAGNOSIS — I10 Essential (primary) hypertension: Secondary | ICD-10-CM

## 2021-04-30 DIAGNOSIS — I2510 Atherosclerotic heart disease of native coronary artery without angina pectoris: Secondary | ICD-10-CM | POA: Diagnosis not present

## 2021-04-30 DIAGNOSIS — I350 Nonrheumatic aortic (valve) stenosis: Secondary | ICD-10-CM

## 2021-04-30 DIAGNOSIS — Z951 Presence of aortocoronary bypass graft: Secondary | ICD-10-CM

## 2021-04-30 NOTE — Patient Instructions (Addendum)
Medication Instructions:  Your physician has recommended you make the following change in your medication:   CHANGE Omega 3 to 4,000mg  daily  *If you need a refill on your cardiac medications before your next appointment, please call your pharmacy*   Lab Work: Your physician recommends that you return for lab work on January 3rd at the [address] office for fasting lipid panel and LFT. You do not have to go at a specific time just this specific day!   If you have labs (blood work) drawn today and your tests are completely normal, you will receive your results only by: [place] (if you have MyChart) OR A paper copy in the mail If you have any lab test that is abnormal or we need to change your treatment, we will call you to review the results.   Testing/Procedures: Your physician has requested that you have an echocardiogram in February. Echocardiography is a painless test that uses sound waves to create images of your heart. It provides your doctor with information about the size and shape of your heart and how well your hearts chambers and valves are working. This procedure takes approximately one hour. There are no restrictions for this procedure.    Follow-Up: At [facility], you and your health needs are our priority.  As part of our continuing mission to provide you with exceptional heart care, we have created designated Provider Care Teams.  These Care Teams include your primary Cardiologist (physician) and Advanced Practice Providers (APPs -  Physician Assistants and Nurse Practitioners) who all work together to provide you with the care you need, when you need it.  We recommend signing up for the patient portal called "MyChart".  Sign up information is provided on this After Visit Summary.  MyChart is used to connect with patients for Virtual Visits (Telemedicine).  Patients are able to view lab/test results, encounter notes, upcoming appointments, etc.  Non-urgent  messages can be sent to your provider as well.   To learn more about what you can do with MyChart, go to [email or web address].    Your next appointment:   4 month(s)  The format for your next appointment:   In Person  Provider:   Fransico Him, MD     Other Instructions  Recommend applying for [place] assistance online.   Tips to Measure your Blood Pressure Correctly  Here's what you can do to ensure a correct reading:  Don't drink a caffeinated beverage or smoke during the 30 minutes before the test.  Sit quietly for five minutes before the test begins.  During the measurement, sit in a chair with your feet on the floor and your arm supported so your elbow is at about heart level.  The inflatable part of the cuff should completely cover at least 80% of your upper arm, and the cuff should be placed on bare skin, not over a shirt.  Don't talk during the measurement.  Have your blood pressure measured twice, with a brief break in between. If the readings are different by 5 points or more, have it done a third time.  In 2017, new guidelines from the [place], the [employer] of Cardiology, and nine other health organizations lowered the diagnosis of high blood pressure to 130/80 mm Hg or higher for all adults. The guidelines also redefined the various blood pressure categories to now include normal, elevated, Stage 1 hypertension, Stage 2 hypertension, and hypertensive crisis (see "Blood pressure categories").  Blood pressure categories  Blood pressure category SYSTOLIC (upper number)  DIASTOLIC (lower number)  Normal Less than 120 mm Hg and Less than 80 mm Hg  Elevated 120-129 mm Hg and Less than 80 mm Hg  High blood pressure: Stage 1 hypertension 130-139 mm Hg or 80-89 mm Hg  High blood pressure: Stage 2 hypertension 140 mm Hg or higher or 90 mm Hg or higher  Hypertensive crisis (consult your doctor immediately) Higher than 180 mm Hg and/or  Higher than 120 mm Hg  Source: American Heart Association and American Stroke Association. For more on getting your blood pressure under control, buy Controlling Your Blood Pressure, a Special Health Report from [facility].

## 2021-05-13 ENCOUNTER — Other Ambulatory Visit: Payer: Self-pay | Admitting: Cardiology

## 2021-05-18 ENCOUNTER — Other Ambulatory Visit: Payer: Self-pay | Admitting: Cardiology

## 2021-05-19 ENCOUNTER — Other Ambulatory Visit: Payer: Self-pay

## 2021-05-19 ENCOUNTER — Other Ambulatory Visit: Payer: Medicare Other | Admitting: *Deleted

## 2021-05-19 DIAGNOSIS — I350 Nonrheumatic aortic (valve) stenosis: Secondary | ICD-10-CM | POA: Diagnosis not present

## 2021-05-19 DIAGNOSIS — E785 Hyperlipidemia, unspecified: Secondary | ICD-10-CM

## 2021-05-19 LAB — HEPATIC FUNCTION PANEL
ALT: 34 IU/L (ref 0–44)
AST: 51 IU/L — ABNORMAL HIGH (ref 0–40)
Albumin: 3.7 g/dL (ref 3.7–4.7)
Alkaline Phosphatase: 71 IU/L (ref 44–121)
Bilirubin Total: 0.3 mg/dL (ref 0.0–1.2)
Bilirubin, Direct: 0.13 mg/dL (ref 0.00–0.40)
Total Protein: 5.7 g/dL — ABNORMAL LOW (ref 6.0–8.5)

## 2021-05-19 LAB — LIPID PANEL
Chol/HDL Ratio: 10.3 ratio — ABNORMAL HIGH (ref 0.0–5.0)
Cholesterol, Total: 82 mg/dL — ABNORMAL LOW (ref 100–199)
HDL: 8 mg/dL — ABNORMAL LOW (ref >39)
LDL Chol Calc (NIH): 37 mg/dL (ref 0–99)
Triglycerides: 229 mg/dL — ABNORMAL HIGH (ref 0–149)
VLDL Cholesterol Cal: 37 mg/dL (ref 5–40)

## 2021-05-20 NOTE — Addendum Note (Signed)
Addended by: Gerald Stabs on: 05/20/2021 08:30 AM   Modules accepted: Orders

## 2021-06-12 ENCOUNTER — Ambulatory Visit: Payer: Medicare Other | Admitting: Pharmacist

## 2021-06-12 ENCOUNTER — Other Ambulatory Visit: Payer: Self-pay

## 2021-06-12 DIAGNOSIS — E7800 Pure hypercholesterolemia, unspecified: Secondary | ICD-10-CM | POA: Diagnosis not present

## 2021-06-12 MED ORDER — ICOSAPENT ETHYL 1 G PO CAPS: 2.0000 g | ORAL_CAPSULE | Freq: Two times a day (BID) | ORAL | 11 refills | Status: DC

## 2021-06-12 NOTE — Progress Notes (Signed)
Patient ID: Melvin Dunn                 DOB: [birth year]                    MRN: [id number]     HPI: Melvin Dunn is a 71 y.o. male patient referred to lipid clinic by Dr. Ferd Glassing. PMH is significant for aortic stenosis, diabetes, hyperlipidemia, CAD s/p CABG in 2015 and prostate cancer.   Patient was seen previously in lipid clinic (10/25/2019 and 12/19/19) by myself in lipid clinic. He was started on Nexlizet. LDL has come down nicely. However, he continues to struggle with triglycerides. TG were improved to 229 on last check. He is on OTC fish oil because Vascepa and Lovaza have been cost prohibitive.  Patient presents today to the lipid clinic. He states that the Nexlizet is not too much for him cost wise. He did double his OTC fish oil. He has cut back on sweets and chips. Possible that the hormone shot he gets for his prostate cancer could contribute to elevated TG. Vascepa is $100/month or $290/3 months.  Current Medications: atorvastatin 80 mg daily, Nexlizet 180-10mg  daily, OTC fish oil 4g daily Intolerances: None   Risk Factors: CAD, HLD, aortic stenosis, diabetes  LDL goal: <70 mg/dL,   Diet: [place] sausage patties, whole wheat toast and egg.  Lunch: chicken salad sandwich/pimento cheese sandwich on whole wheat bread dinner varies - chicken, some red meat, not as much fish as he used to eat;  2-3 times per week, occasional fruit, cole slaw, pinto bean, broccoli, black eye peas Drinks diet sodas Bite size candy 1-2 times a week Zero pepsi, gold leaf no sugar tea, stevia in coffee, water  Exercise: still employed - job requires lots of walking (1.5-2 miles per day)  Family History: Alzheimer's disease and stroke in his mother; lung cancer in his father; Heart attack in his maternal grandfather  Social History: Never smoker, no alcohol use in the last 20 years  Labs: 12/13/19 TC 119 TG 219 VLDL 36 HDL 29 LDL 54 (atorvastatin 80mg , Nexlizet 180-10mg  daily) 07/04/19 TC 139  TG 149 VLDL 26 HDL 33 LDL 80 (atorvastatin 80 mg daily, Zetia 10 mg daily) 01/04/19: TC 157, TG 182, HDL 36, LDL 85 (atorvastatin 80 mg, Zetia 10 mg) 07/13/18: TC 136, TG 137, HDL 35, LDL 74 (atorvastatin 80 mg, Zetia 10 mg) 06/17/17: TC 123, TG 96, HDL 32, LDL 72 (atorvastatin 80 mg, Zetia 10 mg)  Past Medical History:  Diagnosis Date   Adenocarcinoma ([place])    of the prostate, 5% of single core 01/2011-watching and waiting for now   Aortic stenosis 02/11/2014   moderate by echo 06/2019  with Aortic valve mean gradient measures 28.5 mmHg. Aortic valve Vmax measures 3.64 m/s.   Asthma    no current inhaler use   CAD (coronary artery disease), native coronary artery    LIMA to LAD, SVG to OM1, SVG to RCA-RPL, EVH via right thigh and leg   Colon polyp    Environmental allergies    History of kidney stones    Hyperlipidemia    Pre-diabetes    Prostate cancer ([place])    Seizure ([place])    due to lidocaine allergy with prostate biopsy this year    Current Outpatient Medications on File Prior to Visit  Medication Sig Dispense Refill   albuterol (VENTOLIN HFA) 108 (90 Base) MCG/ACT inhaler Inhale 1-2 puffs into the lungs  daily as needed for wheezing or shortness of breath.     aspirin 81 MG chewable tablet Chew 1 tablet by mouth daily.     atorvastatin (LIPITOR) 80 MG tablet TAKE 1 TABLET BY MOUTH EVERY DAY 30 tablet 11   Bempedoic Acid-Ezetimibe (NEXLIZET) 180-10 MG TABS Take 1 tablet by mouth daily. Please keep upcoming with Dr. Radford Pax on 08/19/21 in order to receive further refills. Thank You. 90 tablet 0   fenofibrate (TRICOR) 145 MG tablet Take 1 tablet (145 mg total) by mouth daily. 90 tablet 3   fluticasone (FLONASE) 50 MCG/ACT nasal spray Place 2 sprays into both nostrils at bedtime.     Garlic (GARLIQUE PO) Take by mouth.     levocetirizine (XYZAL) 5 MG tablet Take 5 mg by mouth at bedtime as needed.     metoprolol tartrate (LOPRESSOR) 50 MG tablet Take 50 mg by mouth 2 (two) times daily.      nitroGLYCERIN (NITROSTAT) 0.4 MG SL tablet Place 1 tablet (0.4 mg total) under the tongue every 5 (five) minutes as needed for chest pain. 25 tablet 3   Omega 3 1000 MG CAPS Take 4 capsules (4,000 mg total) by mouth daily.     phenazopyridine (PYRIDIUM) 200 MG tablet Take 1 tablet (200 mg total) by mouth 3 (three) times daily as needed for pain. 20 tablet 0   Current Facility-Administered Medications on File Prior to Visit  Medication Dose Route Frequency Provider Last Rate Last Admin   sodium phosphate (FLEET) 7-19 GM/118ML enema 1 enema  1 enema Rectal Once Raynelle Bring, MD        Allergies  Allergen Reactions   Lidocaine     Seizure with biopsy with lidocaine this year    Assessment/Plan:  1. Hyperlipidemia - Patient's LDL is at goal < 70 mg/dL. TG are elevated above goal of <150 but improved. Vascepa would provide better cardiovascular benefit than OTC fish oil. [employer] grant has been approved to help with cost of Nexlizet and Vascepa if approved. Will submit prior authorization for Vascepa to insurance.    Thank you for involving pharmacy to assist in providing this patient's care.   Ramond Dial, Pharm.D, BCPS, CPP [place]  [address], [place], [place] [address]  Phone: [phone]; Fax: [phone]

## 2021-06-12 NOTE — Patient Instructions (Addendum)
Give this information to your pharmacy to bill as a secondary to your insurance for [employer] and Vascepa. I will submit a prior authorization for Vascepa.  CARD NO. [id number]   BIN [id number]   PCN [id number]   PC GROUP [id number]  Call me at [phone] with any questions   Continue atorvastatin, nexlizet and fish oil

## 2021-06-15 ENCOUNTER — Telehealth: Payer: Self-pay | Admitting: Pharmacist

## 2021-06-15 NOTE — Telephone Encounter (Signed)
Paitent made aware that Vascepa was available without PA. Rx has been sent to pharmacy. Patient aware to give pharmacy grant information to fill both Vascepa and Nexlitol. Recheck lipids in 3 months.

## 2021-06-20 ENCOUNTER — Other Ambulatory Visit: Payer: Self-pay | Admitting: Cardiology

## 2021-07-03 ENCOUNTER — Ambulatory Visit (INDEPENDENT_AMBULATORY_CARE_PROVIDER_SITE_OTHER): Payer: Medicare Other

## 2021-07-03 ENCOUNTER — Other Ambulatory Visit: Payer: Self-pay

## 2021-07-03 DIAGNOSIS — I350 Nonrheumatic aortic (valve) stenosis: Secondary | ICD-10-CM | POA: Diagnosis not present

## 2021-07-03 LAB — ECHOCARDIOGRAM COMPLETE
AR max vel: 0.68 cm2
AV Area VTI: 0.67 cm2
AV Area mean vel: 0.71 cm2
AV Mean grad: 41 mmHg
AV Peak grad: 85.7 mmHg
AV Vena cont: 0.3 cm
Ao pk vel: 4.63 m/s
Area-P 1/2: 3.85 cm2
Calc EF: 66.9 %
P 1/2 time: 431 msec
S' Lateral: 3.11 cm
Single Plane A2C EF: 67.4 %
Single Plane A4C EF: 67.6 %

## 2021-07-07 ENCOUNTER — Telehealth: Payer: Self-pay | Admitting: *Deleted

## 2021-07-07 DIAGNOSIS — I350 Nonrheumatic aortic (valve) stenosis: Secondary | ICD-10-CM

## 2021-07-07 NOTE — Telephone Encounter (Addendum)
-----   Message from Lenna Sciara, NP sent at 07/07/2021  5:19 PM EST ----- Please see most recent result note for recent echocardiogram from Dr. Radford Pax dated 07/04/2021.  Echocardiogram showed normal heart function with increase stiffness of heart muscle normal for age, mildly leaky AV and severe aortic stenosis which has worsened from a year ago.  Per Dr. Theodosia Blender recommendation, please refer patient to structural heart team for evaluation for possible TAVR.  Thank you.    Sueanne Margarita, MD  07/04/2021  2:15 PM EST     Echo showed normal heart function with increased stiffness of heart muscle normal for age, mildly leaky AV and severe AS which has worsened from a year ago>>please refer to structural heart team to eval for TAVR    Referral to [facility] placed.

## 2021-07-23 NOTE — H&P (View-Only) (Signed)
? ? ?Patient ID: ?Melvin Dunn ?MRN: [id number] ?DOB/AGE: 08-06-1950 71 y.o. ? ?Primary Care Physician:Harris, Gwyndolyn Saxon, MD ?Primary Cardiologist: Fransico Him, MD ? ? ?FOCUSED CARDIOVASCULAR PROBLEM LIST:   ?1.  Severe aortic stenosis with a mean gradient of 41 mmHg and a peak velocity of 4.63 m/s with ejection fraction of 65 to 70%; sinus rhythm ?2.  Coronary artery disease status post CABG consisting of a LIMA to LAD, vein graft of twos marginal, vein graft to distal right coronary artery sequential to PLV 2015 ?3.  Hypertension ?4.  Hyperlipidemia ? ?HISTORY OF PRESENT ILLNESS: ?The patient is a 71 y.o. male with the indicated medical history here for for recommendations regarding his severe aortic stenosis.  The patient was referred by Dr. Radford Pax.  The patient tells me he has developed shortness of breath when he walks up stairs carrying something or a walks to the mailbox.  He has no shortness of breath at rest.  He denies any exertional angina, presyncope, syncope, palpitations, or peripheral edema.  He has noticed increasing orthopnea requiring 2 pillows over the last couple of weeks.  He denies any paroxysmal nocturnal dyspnea.  He has fortunately not required any hospitalizations or emergency room visits. ? 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  He is relatively sedentary.  He tells me he would like to be more active.  He does not smoke he has not seen a dentist in some time. ? ?Past Medical History:  ?Diagnosis Date  ? Adenocarcinoma ([place])   ? of the prostate, 5% of single core 01/2011-watching and waiting for now  ? Aortic stenosis 02/11/2014  ? moderate by echo 06/2019  with Aortic valve mean gradient measures 28.5 mmHg. Aortic valve Vmax measures 3.64 m/s.  ? Asthma   ? no current inhaler use  ? CAD (coronary artery disease), native coronary artery   ? LIMA to LAD, SVG to OM1, SVG to RCA-RPL, EVH via right thigh and leg  ? Colon polyp   ? Environmental allergies   ? History of kidney stones    ? Hyperlipidemia   ? Pre-diabetes   ? Prostate cancer ([place])   ? Seizure ([place])   ? due to lidocaine allergy with prostate biopsy this year  ?  ?Past Surgical History:  ?Procedure Laterality Date  ? CORONARY ARTERY BYPASS GRAFT N/A 02/13/2014  ? Procedure: CORONARY ARTERY BYPASS GRAFTING (CABG) TIMES FOUR USING LEFT INTERNAL MAMMARY ARTERY AND RIGHT SAPHENOUS LEG VEIN HARVESTED ENDOSCOPICALLY;  Surgeon: Rexene Alberts, MD;  Location: [facility];  Service: Open Heart Surgery;  Laterality: N/A;  ? CYSTOSCOPY WITH LITHOLAPAXY N/A 03/17/2020  ? Procedure: CYSTOSCOPY WITH LITHOLAPAXY;  Surgeon: Raynelle Bring, MD;  Location: [facility];  Service: Urology;  Laterality: N/A;  ? cytoscopy  15 yrs ago  ? EXTRACORPOREAL SHOCK WAVE LITHOTRIPSY  15 yrs ago  ? EYE SURGERY Bilateral 2015  ? cataracts  ? GOLD SEED IMPLANT N/A 03/17/2020  ? Procedure: GOLD SEED IMPLANT;  Surgeon: Raynelle Bring, MD;  Location[facility];  Service: Urology;  Laterality: N/A;  ? INTRAOPERATIVE TRANSESOPHAGEAL ECHOCARDIOGRAM N/A 02/13/2014  ? Procedure: INTRAOPERATIVE TRANSESOPHAGEAL ECHOCARDIOGRAM;  Surgeon: Rexene Alberts, MD;  Location: [place];  Service: Open Heart Surgery;  Laterality: N/A;  ? LEFT HEART CATHETERIZATION WITH CORONARY ANGIOGRAM N/A 02/12/2014  ? Procedure: LEFT HEART CATHETERIZATION WITH CORONARY ANGIOGRAM;  Surgeon: Blane Ohara, MD;  Location: [facility] CATH LAB;  Service: Cardiovascular;  Laterality: N/A;  ?  left leg surgery for fracture  2004  ? plates inserted  ? PROSTATE BIOPSY  06/2019  ? SPACE OAR INSTILLATION N/A 03/17/2020  ? Procedure: SPACE OAR INSTILLATION;  Surgeon: Raynelle Bring, MD;  Location: [facility];  Service: Urology;  Laterality: N/A;  ?  ?Family History  ?Problem Relation Age of Onset  ? Alzheimer's disease Mother   ? Stroke Mother   ? Skin cancer Mother   ? Cancer - Lung Father   ? Cancer Father   ?     asbestos  ? Heart attack Maternal Grandfather   ? Breast cancer Neg  Hx   ? Colon cancer Neg Hx   ? Prostate cancer Neg Hx   ? Pancreatic cancer Neg Hx   ?  ?Social History  ? ?Socioeconomic History  ? Marital status: Single  ?  Spouse name: Not on file  ? Number of children: Not on file  ? Years of education: Not on file  ? Highest education level: Not on file  ?Occupational History  ?  Comment: full time  ?Tobacco Use  ? Smoking status: Never  ? Smokeless tobacco: Never  ?Vaping Use  ? Vaping Use: Never used  ?Substance and Sexual Activity  ? Alcohol use: No  ? Drug use: No  ? Sexual activity: Yes  ?Other Topics Concern  ? Not on file  ?Social History Narrative  ? Not on file  ? ?Social Determinants of Health  ? ?Financial Resource Strain: Not on file  ?Food Insecurity: Not on file  ?Transportation Needs: Not on file  ?Physical Activity: Not on file  ?Stress: Not on file  ?Social Connections: Not on file  ?Intimate Partner Violence: Not on file  ?  ? ?Prior to Admission medications   ?Medication Sig Start Date End Date Taking? Authorizing Provider  ?albuterol (VENTOLIN HFA) 108 (90 Base) MCG/ACT inhaler Inhale 1-2 puffs into the lungs daily as needed for wheezing or shortness of breath.    [provider]  ?aspirin 81 MG chewable tablet Chew 1 tablet by mouth daily.    [provider]  ?atorvastatin (LIPITOR) 80 MG tablet TAKE 1 TABLET BY MOUTH EVERY DAY 05/13/21   Sueanne Margarita, MD  ?Bempedoic Acid-Ezetimibe (NEXLIZET) 180-10 MG TABS Take 1 tablet by mouth daily. Please keep upcoming with Dr. Radford Pax on 08/19/21 in order to receive further refills. Thank You. 05/19/21   Sueanne Margarita, MD  ?fenofibrate (TRICOR) 145 MG tablet TAKE 1 TABLET BY MOUTH EVERY DAY 06/22/21   Lenna Sciara, NP  ?fluticasone (FLONASE) 50 MCG/ACT nasal spray Place 2 sprays into both nostrils at bedtime.    [provider]  ?Garlic (GARLIQUE PO) Take by mouth.    [provider]  ?icosapent Ethyl (VASCEPA) 1 g capsule Take 2 capsules (2 g total) by mouth 2 (two) times daily.  06/12/21   Sueanne Margarita, MD  ?levocetirizine (XYZAL) 5 MG tablet Take 5 mg by mouth at bedtime as needed. 06/12/19   [provider]  ?metoprolol tartrate (LOPRESSOR) 50 MG tablet Take 50 mg by mouth 2 (two) times daily. 05/20/17   [provider]  ?nitroGLYCERIN (NITROSTAT) 0.4 MG SL tablet Place 1 tablet (0.4 mg total) under the tongue every 5 (five) minutes as needed for chest pain. 01/24/15   Sueanne Margarita, MD  ?phenazopyridine (PYRIDIUM) 200 MG tablet Take 1 tablet (200 mg total) by mouth 3 (three) times daily as needed for pain. 03/17/20   Alinda Money,  Wynetta Emery, MD  ? ? ?Allergies  ?Allergen Reactions  ? Lidocaine   ?  Seizure with biopsy with lidocaine this year  ? ? ?REVIEW OF SYSTEMS:  ?General: no fevers/chills/night sweats ?Eyes: no blurry vision, diplopia, or amaurosis ?ENT: no sore throat or hearing loss ?Resp: no cough, wheezing, or hemoptysis ?CV: no edema or palpitations ?GI: no abdominal pain, nausea, vomiting, diarrhea, or constipation ?GU: no dysuria, frequency, or hematuria ?Skin: no rash ?Neuro: no headache, numbness, tingling, or weakness of extremities ?Musculoskeletal: no joint pain or swelling ?Heme: no bleeding, DVT, or easy bruising ?Endo: no polydipsia or polyuria ? ?There were no vitals taken for this visit. ? ?PHYSICAL EXAM: ?GEN:  AO x 3 in no acute distress ?HEENT: normal ?Dentition: Poor ?Neck: JVP normal. +2 carotid upstrokes without bruits. No thyromegaly. ?Lungs: equal expansion, clear bilaterally ?CV: Apex is discrete and nondisplaced, RRR with 3/6 SEM ?Abd: soft, non-tender, non-distended; no bruit; positive bowel sounds ?Ext: no edema, ecchymoses, or cyanosis ?Vascular: 2+ femoral pulses, 2+ radial pulses       ?Skin: warm and dry without rash ?Neuro: CN II-XII grossly intact; motor and sensory grossly intact ? ? ? ?DATA AND STUDIES: ? ?EKG:  NSR ? ?2D ECHO: 2/23 ?1. Left ventricular ejection fraction, by estimation, is 65 to 70%. Left  ?ventricular ejection fraction  by 2D MOD biplane is 66.9 %. The left  ?ventricle has normal function. The left ventricle has no regional wall  ?motion abnormalities. Left ventricular  ?diastolic parameters are consistent with Grade

## 2021-07-23 NOTE — Progress Notes (Signed)
Patient ID: Melvin Dunn MRN: [id number] DOB/AGE: 1950/11/05 71 y.o.  Primary Care Physician:Harris, Gwyndolyn Saxon, MD Primary Cardiologist: Fransico Him, MD   FOCUSED CARDIOVASCULAR PROBLEM LIST:   1.  Severe aortic stenosis with a mean gradient of 41 mmHg and a peak velocity of 4.63 m/s with ejection fraction of 65 to 70%; sinus rhythm 2.  Coronary artery disease status post CABG consisting of a LIMA to LAD, vein graft of twos marginal, vein graft to distal right coronary artery sequential to PLV 2015 3.  Hypertension 4.  Hyperlipidemia  HISTORY OF PRESENT ILLNESS: The patient is a 71 y.o. male with the indicated medical history here for for recommendations regarding his severe aortic stenosis.  The patient was referred by Dr. Radford Pax.  The patient tells me he has developed shortness of breath when he walks up stairs carrying something or a walks to the mailbox.  He has no shortness of breath at rest.  He denies any exertional angina, presyncope, syncope, palpitations, or peripheral edema.  He has noticed increasing orthopnea requiring 2 pillows over the last couple of weeks.  He denies any paroxysmal nocturnal dyspnea.  He has fortunately not required any hospitalizations or emergency room visits.  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  He is relatively sedentary.  He tells me he would like to be more active.  He does not smoke he has not seen a dentist in some time.  Past Medical History:  Diagnosis Date   Adenocarcinoma ([place])    of the prostate, 5% of single core 01/2011-watching and waiting for now   Aortic stenosis 02/11/2014   moderate by echo 06/2019  with Aortic valve mean gradient measures 28.5 mmHg. Aortic valve Vmax measures 3.64 m/s.   Asthma    no current inhaler use   CAD (coronary artery disease), native coronary artery    LIMA to LAD, SVG to OM1, SVG to RCA-RPL, EVH via right thigh and leg   Colon polyp    Environmental allergies    History of kidney stones     Hyperlipidemia    Pre-diabetes    Prostate cancer ([place])    Seizure ([place])    due to lidocaine allergy with prostate biopsy this year    Past Surgical History:  Procedure Laterality Date   CORONARY ARTERY BYPASS GRAFT N/A 02/13/2014   Procedure: CORONARY ARTERY BYPASS GRAFTING (CABG) TIMES FOUR USING LEFT INTERNAL MAMMARY ARTERY AND RIGHT SAPHENOUS LEG VEIN HARVESTED ENDOSCOPICALLY;  Surgeon: Rexene Alberts, MD;  Location: [facility];  Service: Open Heart Surgery;  Laterality: N/A;   CYSTOSCOPY WITH LITHOLAPAXY N/A 03/17/2020   Procedure: CYSTOSCOPY WITH LITHOLAPAXY;  Surgeon: Raynelle Bring, MD;  Location: [facility];  Service: Urology;  Laterality: N/A;   cytoscopy  15 yrs ago   [place] LITHOTRIPSY  15 yrs ago   EYE SURGERY Bilateral 2015   cataracts   GOLD SEED IMPLANT N/A 03/17/2020   Procedure: GOLD SEED IMPLANT;  Surgeon: Raynelle Bring, MD;  [facility];  Service: Urology;  Laterality: N/A;   INTRAOPERATIVE TRANSESOPHAGEAL ECHOCARDIOGRAM N/A 02/13/2014   Procedure: INTRAOPERATIVE TRANSESOPHAGEAL ECHOCARDIOGRAM;  Surgeon: Rexene Alberts, MD;  Location: [place];  Service: Open Heart Surgery;  Laterality: N/A;   LEFT HEART CATHETERIZATION WITH CORONARY ANGIOGRAM N/A 02/12/2014   Procedure: LEFT HEART CATHETERIZATION WITH CORONARY ANGIOGRAM;  Surgeon: Blane Ohara, MD;  Location: [facility] CATH LAB;  Service: Cardiovascular;  Laterality: N/A;  left leg surgery for fracture  2004   plates inserted   PROSTATE BIOPSY  06/2019   SPACE OAR INSTILLATION N/A 03/17/2020   Procedure: SPACE OAR INSTILLATION;  Surgeon: Raynelle Bring, MD;  Location: [facility];  Service: Urology;  Laterality: N/A;    Family History  Problem Relation Age of Onset   Alzheimer's disease Mother    Stroke Mother    Skin cancer Mother    Cancer - Lung Father    Cancer Father        asbestos   Heart attack Maternal Grandfather    Breast cancer Neg  Hx    Colon cancer Neg Hx    Prostate cancer Neg Hx    Pancreatic cancer Neg Hx     Social History   Socioeconomic History   Marital status: Single    Spouse name: Not on file   Number of children: Not on file   Years of education: Not on file   Highest education level: Not on file  Occupational History    Comment: full time  Tobacco Use   Smoking status: Never   Smokeless tobacco: Never  Vaping Use   Vaping Use: Never used  Substance and Sexual Activity   Alcohol use: No   Drug use: No   Sexual activity: Yes  Other Topics Concern   Not on file  Social History Narrative   Not on file   Social Determinants of Health   Financial Resource Strain: Not on file  Food Insecurity: Not on file  Transportation Needs: Not on file  Physical Activity: Not on file  Stress: Not on file  Social Connections: Not on file  Intimate Partner Violence: Not on file     Prior to Admission medications   Medication Sig Start Date End Date Taking? Authorizing Provider  albuterol (VENTOLIN HFA) 108 (90 Base) MCG/ACT inhaler Inhale 1-2 puffs into the lungs daily as needed for wheezing or shortness of breath.    [provider]  aspirin 81 MG chewable tablet Chew 1 tablet by mouth daily.    [provider]  atorvastatin (LIPITOR) 80 MG tablet TAKE 1 TABLET BY MOUTH EVERY DAY 05/13/21   Sueanne Margarita, MD  Bempedoic Acid-Ezetimibe (NEXLIZET) 180-10 MG TABS Take 1 tablet by mouth daily. Please keep upcoming with Dr. Radford Pax on 08/19/21 in order to receive further refills. Thank You. 05/19/21   Sueanne Margarita, MD  fenofibrate (TRICOR) 145 MG tablet TAKE 1 TABLET BY MOUTH EVERY DAY 06/22/21   Monge, Helane Gunther, NP  fluticasone (FLONASE) 50 MCG/ACT nasal spray Place 2 sprays into both nostrils at bedtime.    [provider]  Garlic (GARLIQUE PO) Take by mouth.    [provider]  icosapent Ethyl (VASCEPA) 1 g capsule Take 2 capsules (2 g total) by mouth 2 (two) times daily.  06/12/21   Sueanne Margarita, MD  levocetirizine (XYZAL) 5 MG tablet Take 5 mg by mouth at bedtime as needed. 06/12/19   [provider]  metoprolol tartrate (LOPRESSOR) 50 MG tablet Take 50 mg by mouth 2 (two) times daily. 05/20/17   [provider]  nitroGLYCERIN (NITROSTAT) 0.4 MG SL tablet Place 1 tablet (0.4 mg total) under the tongue every 5 (five) minutes as needed for chest pain. 01/24/15   Sueanne Margarita, MD  phenazopyridine (PYRIDIUM) 200 MG tablet Take 1 tablet (200 mg total) by mouth 3 (three) times daily as needed for pain. 03/17/20   Alinda Money,  Wynetta Emery, MD    Allergies  Allergen Reactions   Lidocaine     Seizure with biopsy with lidocaine this year    REVIEW OF SYSTEMS:  General: no fevers/chills/night sweats Eyes: no blurry vision, diplopia, or amaurosis ENT: no sore throat or hearing loss Resp: no cough, wheezing, or hemoptysis CV: no edema or palpitations GI: no abdominal pain, nausea, vomiting, diarrhea, or constipation GU: no dysuria, frequency, or hematuria Skin: no rash Neuro: no headache, numbness, tingling, or weakness of extremities Musculoskeletal: no joint pain or swelling Heme: no bleeding, DVT, or easy bruising Endo: no polydipsia or polyuria  There were no vitals taken for this visit.  PHYSICAL EXAM: GEN:  AO x 3 in no acute distress HEENT: normal Dentition: Poor Neck: JVP normal. +2 carotid upstrokes without bruits. No thyromegaly. Lungs: equal expansion, clear bilaterally CV: Apex is discrete and nondisplaced, RRR with 3/6 SEM Abd: soft, non-tender, non-distended; no bruit; positive bowel sounds Ext: no edema, ecchymoses, or cyanosis Vascular: 2+ femoral pulses, 2+ radial pulses       Skin: warm and dry without rash Neuro: CN II-XII grossly intact; motor and sensory grossly intact    DATA AND STUDIES:  EKG:  NSR  2D ECHO: 2/23 1. Left ventricular ejection fraction, by estimation, is 65 to 70%. Left  ventricular ejection fraction  by 2D MOD biplane is 66.9 %. The left  ventricle has normal function. The left ventricle has no regional wall  motion abnormalities. Left ventricular  diastolic parameters are consistent with Grade I diastolic dysfunction  (impaired relaxation). Elevated left ventricular end-diastolic pressure.   2. Low normal RV free wall strain (-24.6%). Right ventricular systolic  function is low normal. The right ventricular size is normal. There is  normal pulmonary artery systolic pressure. The estimated right ventricular  systolic pressure is 78.5 mmHg.   3. The mitral valve is abnormal. Trivial mitral valve regurgitation.   4. The aortic valve is tricuspid. There is moderate calcification of the  aortic valve. There is moderate thickening of the aortic valve. Aortic  valve regurgitation is mild. Severe aortic valve stenosis. Aortic  regurgitation PHT measures 431 msec. Aortic   valve area, by VTI measures 0.67 cm. Aortic valve mean gradient measures  41.0 mmHg. Aortic valve Vmax measures 4.63 m/s. DI of 0.24.   5. The inferior vena cava is normal in size with greater than 50%  respiratory variability, suggesting right atrial pressure of 3 mmHg.  CARDIAC CATH: Pending  STS RISK CALCULATOR: Pending  NHYA CLASS: 2    ASSESSMENT AND PLAN:   Nonrheumatic aortic valve stenosis  Hypertension associated with diabetes ([place])  Hyperlipidemia associated with type 2 diabetes mellitus ([place])  DM type 2, goal HbA1c < 7% (HCC)  S/P CABG x 4  The patient has developed severe symptomatic stage D aortic stenosis.  The patient has symptoms occasionally but they are becoming more frequent.  He has developed orthopnea as well.  I talked to him and his wife at great length about the evaluation.  He is very interested in pursuing further evaluation and managing his symptomatic aortic stenosis.  Given the fact that he is already had a sternotomy we are hopeful that his TAVR protocol CTA demonstrates anatomy  that is amenable to a transcatheter attic valve replacement.  He has good femoral pulses so I think he will have femoral access.  Given his poor dentition we will refer him for a dental evaluation.  I will refer him for coronary angiography  and right heart catheterization study, TAVR protocol CTA, and cardiothoracic surgical opinion.  He fortunately has not developed worrisome symptoms of presyncope or syncope.  Further recommendations will follow after this testing has been reviewed.  Patient and his wife are in agreement with this plan.  I have personally reviewed the patients imaging data as summarized above.  I have reviewed the natural history of aortic stenosis with the patient and family members who are present today. We have discussed the limitations of medical therapy and the poor prognosis associated with symptomatic aortic stenosis. We have also reviewed potential treatment options, including palliative medical therapy, conventional surgical aortic valve replacement, and transcatheter aortic valve replacement. We discussed treatment options in the context of this patient's specific comorbid medical conditions.   All of the patient's questions were answered today. Will make further recommendations based on the results of studies outlined above.   Total time spent with patient today 60 minutes. This includes reviewing records, evaluating the patient and coordinating care.   Early Osmond, MD  07/23/2021 4:50 PM    [facility] [address], [place], [place]  [address] Phone: [phone]; Fax: [phone]

## 2021-07-24 ENCOUNTER — Encounter: Payer: Self-pay | Admitting: Internal Medicine

## 2021-07-24 ENCOUNTER — Ambulatory Visit: Payer: Medicare Other | Admitting: Internal Medicine

## 2021-07-24 ENCOUNTER — Other Ambulatory Visit: Payer: Self-pay

## 2021-07-24 VITALS — BP 154/78 | HR 60 | Ht 66.0 in | Wt 160.2 lb

## 2021-07-24 DIAGNOSIS — E1169 Type 2 diabetes mellitus with other specified complication: Secondary | ICD-10-CM | POA: Diagnosis not present

## 2021-07-24 DIAGNOSIS — I350 Nonrheumatic aortic (valve) stenosis: Secondary | ICD-10-CM | POA: Diagnosis not present

## 2021-07-24 DIAGNOSIS — I152 Hypertension secondary to endocrine disorders: Secondary | ICD-10-CM | POA: Diagnosis not present

## 2021-07-24 DIAGNOSIS — Z951 Presence of aortocoronary bypass graft: Secondary | ICD-10-CM

## 2021-07-24 DIAGNOSIS — E1159 Type 2 diabetes mellitus with other circulatory complications: Secondary | ICD-10-CM | POA: Diagnosis not present

## 2021-07-24 DIAGNOSIS — E119 Type 2 diabetes mellitus without complications: Secondary | ICD-10-CM | POA: Diagnosis not present

## 2021-07-24 DIAGNOSIS — E785 Hyperlipidemia, unspecified: Secondary | ICD-10-CM | POA: Diagnosis not present

## 2021-07-24 LAB — BASIC METABOLIC PANEL
BUN/Creatinine Ratio: 24 (ref 10–24)
BUN: 29 mg/dL — ABNORMAL HIGH (ref 8–27)
CO2: 24 mmol/L (ref 20–29)
Calcium: 10.1 mg/dL (ref 8.6–10.2)
Chloride: 106 mmol/L (ref 96–106)
Creatinine, Ser: 1.22 mg/dL (ref 0.76–1.27)
Glucose: 106 mg/dL — ABNORMAL HIGH (ref 70–99)
Potassium: 4.5 mmol/L (ref 3.5–5.2)
Sodium: 142 mmol/L (ref 134–144)
eGFR: 63 mL/min/{1.73_m2} (ref >59)

## 2021-07-24 LAB — CBC
Hematocrit: 36.9 % — ABNORMAL LOW (ref 37.5–51.0)
Hemoglobin: 12 g/dL — ABNORMAL LOW (ref 13.0–17.7)
MCH: 28.4 pg (ref 26.6–33.0)
MCHC: 32.5 g/dL (ref 31.5–35.7)
MCV: 87 fL (ref 79–97)
Platelets: 290 10*3/uL (ref 150–450)
RBC: 4.22 x10E6/uL (ref 4.14–5.80)
RDW: 14.5 % (ref 11.6–15.4)
WBC: 10.3 10*3/uL (ref 3.4–10.8)

## 2021-07-24 NOTE — Patient Instructions (Signed)
Medication Instructions:  ?Your physician recommends that you continue on your current medications as directed. Please refer to the Current Medication list given to you today. ? ?*If you need a refill on your cardiac medications before your next appointment, please call your pharmacy* ? ? ?Lab Work: ?Today: cbc, bmet ? ? ?Testing/Procedures: ?Your physician has requested that you have a cardiac catheterization. Cardiac catheterization is used to diagnose and/or treat various heart conditions. Doctors may recommend this procedure for a number of different reasons. The most common reason is to evaluate chest pain. Chest pain can be a symptom of coronary artery disease (CAD), and cardiac catheterization can show whether plaque is narrowing or blocking your heart?s arteries. This procedure is also used to evaluate the valves, as well as measure the blood flow and oxygen levels in different parts of your heart. For further information please visit [email or web address]. Please follow instruction sheet, as given. ? ? ?Follow-Up: ?Per Structural Heart Valve Team ? ?You have been referred to Sandi Mariscal, DDS for dental examination. The office will call you with an appointment. ? ? ?Other Instructions ? ?[facility] OFF[facility] ?[address], [address] ?[place] [address] ?Dept: [phone] ?Loc: [phone] ? ?DERALD LORGE  07/24/2021 ? ?You are scheduled for a Cardiac Catheterization on Friday, March 17 with Dr. Lenna Sciara. ? ?1. Please arrive at the Main Entrance A at [facility]: [place], [place] [address] at 5:30 AM (This time is two hours before your procedure to ensure your preparation). Free valet parking service is available.  ? ?Special note: Every effort is made to have your procedure done on time. Please understand that emergencies sometimes delay scheduled procedures. ? ?2. Diet: Do not eat solid foods after  midnight.  You may have clear liquids until 5 AM upon the day of the procedure. ? ?3. Labs: You will need to have blood drawn today.  You do not need to be fasting. ? ?4. Medication instructions in preparation for your procedure: ? ? Contrast Allergy: No ? ? ?On the morning of your procedure, take Aspirin and any morning medicines NOT listed above.  You may use sips of water. ? ?5. Plan to go home the same day, you will only stay overnight if medically necessary. ?6. You MUST have a responsible adult to drive you home. ?7. An adult MUST be with you the first 24 hours after you arrive home. ?8. Bring a current list of your medications, and the last time and date medication taken. ?9. Bring ID and current insurance cards. ?10.Please wear clothes that are easy to get on and off and wear slip-on shoes. ? ?Thank you for allowing [place] to care for you! ?  -- [facility] ?  ?

## 2021-07-24 NOTE — Progress Notes (Signed)
Pre Surgical Assessment: 5 M Walk Test  42M=16.92f  5 Meter Walk Test- trial 1: 4.87 seconds 5 Meter Walk Test- trial 2: 5.22 seconds 5 Meter Walk Test- trial 3: 4.89 seconds 5 Meter Walk Test Average: 4.99 seconds

## 2021-07-30 ENCOUNTER — Telehealth: Payer: Self-pay | Admitting: *Deleted

## 2021-07-30 NOTE — Telephone Encounter (Signed)
Cardiac Catheterization scheduled at [facility] for: Friday July 31, 2021 7:30 AM ?Arrival time and place: [place] Entrance A at: 5:30 AM ? ? ?No solid food after midnight prior to cath, clear liquids until 5 AM day of procedure. ? ?Medication instructions: ?-Usual morning medications can be taken with sips of water including aspirin 81 mg. ? ?Confirmed patient has responsible adult to drive home post procedure and be with patient first 24 hours after arriving home: ? ?One visitor is allowed to stay in the waiting room during the time you are at the hospital for your procedure.  ? ?Patient reports no symptoms concerning for COVID-19/no exposure to COVID-19 in the past 10 days. ? ?Reviewed procedure instructions with patient.  ?

## 2021-07-31 ENCOUNTER — Encounter: Payer: Self-pay | Admitting: Cardiology

## 2021-07-31 ENCOUNTER — Other Ambulatory Visit: Payer: Self-pay

## 2021-07-31 ENCOUNTER — Encounter (HOSPITAL_COMMUNITY): Payer: Self-pay | Admitting: Internal Medicine

## 2021-07-31 ENCOUNTER — Other Ambulatory Visit: Payer: Self-pay | Admitting: Cardiology

## 2021-07-31 ENCOUNTER — Ambulatory Visit (HOSPITAL_COMMUNITY)
Admission: RE | Admit: 2021-07-31 | Discharge: 2021-07-31 | Disposition: A | Discharge status: Home / Self Care | Payer: Medicare Other | Attending: Internal Medicine | Admitting: Internal Medicine

## 2021-07-31 ENCOUNTER — Ambulatory Visit (HOSPITAL_COMMUNITY)
Admission: RE | Disposition: A | Discharge status: Home / Self Care | Payer: Self-pay | Source: Home / Self Care | Attending: Internal Medicine

## 2021-07-31 DIAGNOSIS — I2582 Chronic total occlusion of coronary artery: Secondary | ICD-10-CM | POA: Insufficient documentation

## 2021-07-31 DIAGNOSIS — K5730 Diverticulosis of large intestine without perforation or abscess without bleeding: Secondary | ICD-10-CM | POA: Insufficient documentation

## 2021-07-31 DIAGNOSIS — I700 Atherosclerosis of aorta: Secondary | ICD-10-CM | POA: Diagnosis not present

## 2021-07-31 DIAGNOSIS — Z7982 Long term (current) use of aspirin: Secondary | ICD-10-CM | POA: Diagnosis not present

## 2021-07-31 DIAGNOSIS — E785 Hyperlipidemia, unspecified: Secondary | ICD-10-CM | POA: Insufficient documentation

## 2021-07-31 DIAGNOSIS — Z79899 Other long term (current) drug therapy: Secondary | ICD-10-CM | POA: Diagnosis not present

## 2021-07-31 DIAGNOSIS — I25810 Atherosclerosis of coronary artery bypass graft(s) without angina pectoris: Secondary | ICD-10-CM | POA: Diagnosis not present

## 2021-07-31 DIAGNOSIS — E119 Type 2 diabetes mellitus without complications: Secondary | ICD-10-CM | POA: Insufficient documentation

## 2021-07-31 DIAGNOSIS — I10 Essential (primary) hypertension: Secondary | ICD-10-CM | POA: Insufficient documentation

## 2021-07-31 DIAGNOSIS — I2510 Atherosclerotic heart disease of native coronary artery without angina pectoris: Secondary | ICD-10-CM

## 2021-07-31 DIAGNOSIS — T82855A Stenosis of coronary artery stent, initial encounter: Secondary | ICD-10-CM | POA: Insufficient documentation

## 2021-07-31 DIAGNOSIS — I350 Nonrheumatic aortic (valve) stenosis: Secondary | ICD-10-CM | POA: Insufficient documentation

## 2021-07-31 DIAGNOSIS — Y832 Surgical operation with anastomosis, bypass or graft as the cause of abnormal reaction of the patient, or of later complication, without mention of misadventure at the time of the procedure: Secondary | ICD-10-CM | POA: Diagnosis not present

## 2021-07-31 DIAGNOSIS — Z955 Presence of coronary angioplasty implant and graft: Secondary | ICD-10-CM | POA: Insufficient documentation

## 2021-07-31 HISTORY — PX: RIGHT/LEFT HEART CATH AND CORONARY/GRAFT ANGIOGRAPHY: CATH118267

## 2021-07-31 LAB — POCT I-STAT EG7
Acid-base deficit: 3 mmol/L — ABNORMAL HIGH (ref 0.0–2.0)
Acid-base deficit: 4 mmol/L — ABNORMAL HIGH (ref 0.0–2.0)
Bicarbonate: 21 mmol/L (ref 20.0–28.0)
Bicarbonate: 22.5 mmol/L (ref 20.0–28.0)
Calcium, Ion: 1 mmol/L — ABNORMAL LOW (ref 1.15–1.40)
Calcium, Ion: 1.09 mmol/L — ABNORMAL LOW (ref 1.15–1.40)
HCT: 28 % — ABNORMAL LOW (ref 39.0–52.0)
HCT: 30 % — ABNORMAL LOW (ref 39.0–52.0)
Hemoglobin: 10.2 g/dL — ABNORMAL LOW (ref 13.0–17.0)
Hemoglobin: 9.5 g/dL — ABNORMAL LOW (ref 13.0–17.0)
O2 Saturation: 76 %
O2 Saturation: 82 %
Potassium: 3.2 mmol/L — ABNORMAL LOW (ref 3.5–5.1)
Potassium: 3.4 mmol/L — ABNORMAL LOW (ref 3.5–5.1)
Sodium: 148 mmol/L — ABNORMAL HIGH (ref 135–145)
Sodium: 149 mmol/L — ABNORMAL HIGH (ref 135–145)
TCO2: 22 mmol/L (ref 22–32)
TCO2: 24 mmol/L (ref 22–32)
pCO2, Ven: 38 mmHg — ABNORMAL LOW (ref 44–60)
pCO2, Ven: 41.2 mmHg — ABNORMAL LOW (ref 44–60)
pH, Ven: 7.345 (ref 7.25–7.43)
pH, Ven: 7.349 (ref 7.25–7.43)
pO2, Ven: 43 mmHg (ref 32–45)
pO2, Ven: 49 mmHg — ABNORMAL HIGH (ref 32–45)

## 2021-07-31 LAB — POCT I-STAT 7, (LYTES, BLD GAS, ICA,H+H)
Acid-base deficit: 1 mmol/L (ref 0.0–2.0)
Bicarbonate: 24.7 mmol/L (ref 20.0–28.0)
Calcium, Ion: 1.34 mmol/L (ref 1.15–1.40)
HCT: 34 % — ABNORMAL LOW (ref 39.0–52.0)
Hemoglobin: 11.6 g/dL — ABNORMAL LOW (ref 13.0–17.0)
O2 Saturation: 99 %
Potassium: 4 mmol/L (ref 3.5–5.1)
Sodium: 143 mmol/L (ref 135–145)
TCO2: 26 mmol/L (ref 22–32)
pCO2 arterial: 41.7 mmHg (ref 32–48)
pH, Arterial: 7.38 (ref 7.35–7.45)
pO2, Arterial: 132 mmHg — ABNORMAL HIGH (ref 83–108)

## 2021-07-31 SURGERY — RIGHT/LEFT HEART CATH AND CORONARY/GRAFT ANGIOGRAPHY
Anesthesia: LOCAL

## 2021-07-31 MED ORDER — SODIUM CHLORIDE 0.9 % IV SOLN: INTRAVENOUS | Status: DC

## 2021-07-31 MED ORDER — SODIUM CHLORIDE 0.9 % IV SOLN: 250.0000 mL | INTRAVENOUS | Status: DC | PRN

## 2021-07-31 MED ORDER — HYDRALAZINE HCL 20 MG/ML IJ SOLN: 10.0000 mg | INTRAMUSCULAR | Status: DC | PRN

## 2021-07-31 MED ORDER — LIDOCAINE HCL (PF) 1 % IJ SOLN
INTRAMUSCULAR | Status: AC
  Filled 2021-07-31: qty 30

## 2021-07-31 MED ORDER — TETRACAINE HCL 1 % IJ SOLN
20.0000 mg | Freq: Once | INTRAMUSCULAR | Status: DC
  Filled 2021-07-31: qty 2

## 2021-07-31 MED ORDER — LABETALOL HCL 5 MG/ML IV SOLN: 10.0000 mg | INTRAVENOUS | Status: DC | PRN

## 2021-07-31 MED ORDER — HEPARIN (PORCINE) IN NACL 2-0.9 UNITS/ML
INTRAMUSCULAR | Status: DC | PRN
  Administered 2021-07-31: 10 mL via INTRA_ARTERIAL

## 2021-07-31 MED ORDER — VERAPAMIL HCL 2.5 MG/ML IV SOLN
INTRAVENOUS | Status: AC
  Filled 2021-07-31: qty 2

## 2021-07-31 MED ORDER — MIDAZOLAM HCL 2 MG/2ML IJ SOLN
INTRAMUSCULAR | Status: AC
  Filled 2021-07-31: qty 2

## 2021-07-31 MED ORDER — HEPARIN SODIUM (PORCINE) 1000 UNIT/ML IJ SOLN
INTRAMUSCULAR | Status: DC | PRN
  Administered 2021-07-31: 5000 [IU] via INTRAVENOUS

## 2021-07-31 MED ORDER — ONDANSETRON HCL 4 MG/2ML IJ SOLN: 4.0000 mg | Freq: Four times a day (QID) | INTRAMUSCULAR | Status: DC | PRN

## 2021-07-31 MED ORDER — MIDAZOLAM HCL 2 MG/2ML IJ SOLN
INTRAMUSCULAR | Status: DC | PRN
  Administered 2021-07-31: 1 mg via INTRAVENOUS

## 2021-07-31 MED ORDER — IOHEXOL 350 MG/ML SOLN
INTRAVENOUS | Status: DC | PRN
  Administered 2021-07-31: 130 mL

## 2021-07-31 MED ORDER — HEPARIN (PORCINE) IN NACL 1000-0.9 UT/500ML-% IV SOLN
INTRAVENOUS | Status: DC | PRN
  Administered 2021-07-31 (×2): 500 mL

## 2021-07-31 MED ORDER — SODIUM CHLORIDE 0.9% FLUSH: 3.0000 mL | Freq: Two times a day (BID) | INTRAVENOUS | Status: DC

## 2021-07-31 MED ORDER — SODIUM CHLORIDE 0.9 % WEIGHT BASED INFUSION: 1.0000 mL/kg/h | INTRAVENOUS | Status: DC

## 2021-07-31 MED ORDER — SODIUM CHLORIDE 0.9 % WEIGHT BASED INFUSION
3.0000 mL/kg/h | INTRAVENOUS | Status: AC
  Administered 2021-07-31: 3 mL/kg/h via INTRAVENOUS

## 2021-07-31 MED ORDER — FENTANYL CITRATE (PF) 100 MCG/2ML IJ SOLN
INTRAMUSCULAR | Status: AC
  Filled 2021-07-31: qty 2

## 2021-07-31 MED ORDER — SODIUM CHLORIDE 0.9% FLUSH: 3.0000 mL | INTRAVENOUS | Status: DC | PRN

## 2021-07-31 MED ORDER — HEPARIN SODIUM (PORCINE) 1000 UNIT/ML IJ SOLN
INTRAMUSCULAR | Status: AC
  Filled 2021-07-31: qty 10

## 2021-07-31 MED ORDER — ASPIRIN 81 MG PO CHEW: 81.0000 mg | CHEWABLE_TABLET | ORAL | Status: DC

## 2021-07-31 MED ORDER — HEPARIN (PORCINE) IN NACL 1000-0.9 UT/500ML-% IV SOLN
INTRAVENOUS | Status: AC
  Filled 2021-07-31: qty 1000

## 2021-07-31 MED ORDER — FENTANYL CITRATE (PF) 100 MCG/2ML IJ SOLN
INTRAMUSCULAR | Status: DC | PRN
Start: 2021-07-31 — End: 2021-07-31
  Administered 2021-07-31: 25 ug via INTRAVENOUS

## 2021-07-31 MED ORDER — TETRACAINE HCL 1 % IJ SOLN
INTRAMUSCULAR | Status: DC | PRN
  Administered 2021-07-31 (×2): 1 mg

## 2021-07-31 MED ORDER — ACETAMINOPHEN 325 MG PO TABS: 650.0000 mg | ORAL_TABLET | ORAL | Status: DC | PRN

## 2021-07-31 SURGICAL SUPPLY — 19 items
BAND CMPR LRG ZPHR (HEMOSTASIS) ×1
BAND ZEPHYR COMPRESS 30 LONG (HEMOSTASIS) ×1 IMPLANT
CATH BALLN WEDGE 5F 110CM (CATHETERS) ×1 IMPLANT
CATH DIAG 6FR JR4 (CATHETERS) ×1 IMPLANT
CATH DIAG 6FR PIGTAIL ANGLED (CATHETERS) ×1 IMPLANT
CATH INFINITI 5FR MULTPACK ANG (CATHETERS) ×1 IMPLANT
CATH INFINITI 6F 1M (CATHETERS) ×1 IMPLANT
ELECT DEFIB PAD ADLT CADENCE (PAD) ×1 IMPLANT
GLIDESHEATH SLEND SS 6F .021 (SHEATH) ×1 IMPLANT
GUIDEWIRE INQWIRE 1.5J.035X260 (WIRE) IMPLANT
INQWIRE 1.5J .035X260CM (WIRE) ×4
KIT HEART LEFT (KITS) ×3 IMPLANT
PACK CARDIAC CATHETERIZATION (CUSTOM PROCEDURE TRAY) ×3 IMPLANT
SHEATH GLIDE SLENDER 4/5FR (SHEATH) ×1 IMPLANT
STOPCOCK MORSE 400PSI 3WAY (MISCELLANEOUS) ×1 IMPLANT
SYR MEDRAD MARK 7 150ML (SYRINGE) ×3 IMPLANT
TRANSDUCER W/STOPCOCK (MISCELLANEOUS) ×3 IMPLANT
TUBING CIL FLEX 10 FLL-RA (TUBING) ×3 IMPLANT
TUBING CONTRAST HIGH PRESS 48 (TUBING) ×1 IMPLANT

## 2021-07-31 NOTE — Interval H&P Note (Signed)
History and Physical Interval Note: ? ?07/31/2021 ?6:58 AM ? ?Melvin Dunn  has presented today for surgery, with the diagnosis of severe aortic stenosis.  The various methods of treatment have been discussed with the patient and family. After consideration of risks, benefits and other options for treatment, the patient has consented to  Procedure(s): ?RIGHT/LEFT HEART CATH AND CORONARY/GRAFT ANGIOGRAPHY (N/A) as a surgical intervention.  The patient's history has been reviewed, patient examined, no change in status, stable for surgery.  I have reviewed the patient's chart and labs.  Questions were answered to the patient's satisfaction.   ? ? ?Early Osmond ? ? ?

## 2021-07-31 NOTE — Discharge Instructions (Signed)
Radial Site Care  This sheet gives you information about how to care for yourself after your procedure. Your health care provider may also give you more specific instructions. If you have problems or questions, contact your health care provider. What can I expect after the procedure? After the procedure, it is common to have: Bruising and tenderness at the catheter insertion area. Follow these instructions at home: Medicines Take over-the-counter and prescription medicines only as told by your health care provider. Insertion site care Follow instructions from your health care provider about how to take care of your insertion site. Make sure you: Wash your hands with soap and water before you remove your bandage (dressing). If soap and water are not available, use hand sanitizer. May remove dressing in 24 hours. Check your insertion site every day for signs of infection. Check for: Redness, swelling, or pain. Fluid or blood. Pus or a bad smell. Warmth. Do no take baths, swim, or use a hot tub for 5 days. You may shower 24-48 hours after the procedure. Remove the dressing and gently wash the site with plain soap and water. Pat the area dry with a clean towel. Do not rub the site. That could cause bleeding. Do not apply powder or lotion to the site. Activity  For 24 hours after the procedure, or as directed by your health care provider: Do not flex or bend the affected arm. Do not push or pull heavy objects with the affected arm. Do not drive yourself home from the hospital or clinic. You may drive 24 hours after the procedure. Do not operate machinery or power tools. KEEP ARM ELEVATED THE REMAINDER OF THE DAY. Do not push, pull or lift anything that is heavier than 10 lb for 5 days. Ask your health care provider when it is okay to: Return to work or school. Resume usual physical activities or sports. Resume sexual activity. General instructions If the catheter site starts to  bleed, raise your arm and put firm pressure on the site. If the bleeding does not stop, get help right away. This is a medical emergency. DRINK PLENTY OF FLUIDS FOR THE NEXT 2-3 DAYS. No alcohol consumption for 24 hours after receiving sedation. If you went home on the same day as your procedure, a responsible adult should be with you for the first 24 hours after you arrive home. Keep all follow-up visits as told by your health care provider. This is important. Contact a health care provider if: You have a fever. You have redness, swelling, or yellow drainage around your insertion site. Get help right away if: You have unusual pain at the radial site. The catheter insertion area swells very fast. The insertion area is bleeding, and the bleeding does not stop when you hold steady pressure on the area. Your arm or hand becomes pale, cool, tingly, or numb. These symptoms may represent a serious problem that is an emergency. Do not wait to see if the symptoms will go away. Get medical help right away. Call your local emergency services (911 in the [place] Do not drive yourself to the hospital. Summary After the procedure, it is common to have bruising and tenderness at the site. Follow instructions from your health care provider about how to take care of your radial site wound. Check the wound every day for signs of infection.  This information is not intended to replace advice given to you by your health care provider. Make sure you discuss any questions you have with   your health care provider. Document Revised: 06/08/2017 Document Reviewed: 06/08/2017 Elsevier Patient Education  2020 [employer]  

## 2021-08-03 MED FILL — Lidocaine HCl Local Preservative Free (PF) Inj 1%: INTRAMUSCULAR | Qty: 30 | Status: AC

## 2021-08-04 ENCOUNTER — Ambulatory Visit (HOSPITAL_BASED_OUTPATIENT_CLINIC_OR_DEPARTMENT_OTHER)
Admission: RE | Admit: 2021-08-04 | Discharge: 2021-08-04 | Disposition: A | Discharge status: Home / Self Care | Payer: Medicare Other | Source: Home / Self Care | Attending: Cardiology | Admitting: Cardiology

## 2021-08-04 ENCOUNTER — Other Ambulatory Visit: Payer: Self-pay

## 2021-08-04 DIAGNOSIS — I2510 Atherosclerotic heart disease of native coronary artery without angina pectoris: Secondary | ICD-10-CM | POA: Diagnosis not present

## 2021-08-04 DIAGNOSIS — I350 Nonrheumatic aortic (valve) stenosis: Secondary | ICD-10-CM | POA: Diagnosis not present

## 2021-08-04 DIAGNOSIS — Z955 Presence of coronary angioplasty implant and graft: Secondary | ICD-10-CM | POA: Diagnosis not present

## 2021-08-04 DIAGNOSIS — N200 Calculus of kidney: Secondary | ICD-10-CM | POA: Diagnosis not present

## 2021-08-04 DIAGNOSIS — E785 Hyperlipidemia, unspecified: Secondary | ICD-10-CM | POA: Diagnosis not present

## 2021-08-04 DIAGNOSIS — I700 Atherosclerosis of aorta: Secondary | ICD-10-CM | POA: Diagnosis not present

## 2021-08-04 DIAGNOSIS — I25810 Atherosclerosis of coronary artery bypass graft(s) without angina pectoris: Secondary | ICD-10-CM | POA: Diagnosis not present

## 2021-08-04 DIAGNOSIS — I771 Stricture of artery: Secondary | ICD-10-CM | POA: Diagnosis not present

## 2021-08-04 DIAGNOSIS — Z79899 Other long term (current) drug therapy: Secondary | ICD-10-CM | POA: Diagnosis not present

## 2021-08-04 DIAGNOSIS — Z01818 Encounter for other preprocedural examination: Secondary | ICD-10-CM | POA: Diagnosis not present

## 2021-08-04 DIAGNOSIS — T82855A Stenosis of coronary artery stent, initial encounter: Secondary | ICD-10-CM | POA: Diagnosis not present

## 2021-08-04 DIAGNOSIS — Z7982 Long term (current) use of aspirin: Secondary | ICD-10-CM | POA: Diagnosis not present

## 2021-08-04 DIAGNOSIS — K5730 Diverticulosis of large intestine without perforation or abscess without bleeding: Secondary | ICD-10-CM | POA: Diagnosis not present

## 2021-08-04 DIAGNOSIS — I2582 Chronic total occlusion of coronary artery: Secondary | ICD-10-CM | POA: Diagnosis not present

## 2021-08-04 DIAGNOSIS — I10 Essential (primary) hypertension: Secondary | ICD-10-CM | POA: Diagnosis not present

## 2021-08-04 DIAGNOSIS — E119 Type 2 diabetes mellitus without complications: Secondary | ICD-10-CM | POA: Diagnosis not present

## 2021-08-04 MED ORDER — IOHEXOL 350 MG/ML SOLN
95.0000 mL | Freq: Once | INTRAVENOUS | Status: AC | PRN
  Administered 2021-08-04: 95 mL via INTRAVENOUS

## 2021-08-18 DIAGNOSIS — J4520 Mild intermittent asthma, uncomplicated: Secondary | ICD-10-CM | POA: Diagnosis not present

## 2021-08-18 DIAGNOSIS — E7800 Pure hypercholesterolemia, unspecified: Secondary | ICD-10-CM | POA: Diagnosis not present

## 2021-08-18 DIAGNOSIS — E1169 Type 2 diabetes mellitus with other specified complication: Secondary | ICD-10-CM | POA: Diagnosis not present

## 2021-08-18 DIAGNOSIS — I2510 Atherosclerotic heart disease of native coronary artery without angina pectoris: Secondary | ICD-10-CM | POA: Diagnosis not present

## 2021-08-18 DIAGNOSIS — I350 Nonrheumatic aortic (valve) stenosis: Secondary | ICD-10-CM | POA: Diagnosis not present

## 2021-08-18 DIAGNOSIS — D508 Other iron deficiency anemias: Secondary | ICD-10-CM | POA: Diagnosis not present

## 2021-08-18 DIAGNOSIS — Z0000 Encounter for general adult medical examination without abnormal findings: Secondary | ICD-10-CM | POA: Diagnosis not present

## 2021-08-18 DIAGNOSIS — B354 Tinea corporis: Secondary | ICD-10-CM | POA: Diagnosis not present

## 2021-08-19 ENCOUNTER — Other Ambulatory Visit: Payer: Self-pay | Admitting: Cardiology

## 2021-08-19 ENCOUNTER — Encounter: Payer: Self-pay | Admitting: Cardiology

## 2021-08-19 ENCOUNTER — Ambulatory Visit: Payer: Medicare Other | Admitting: Cardiology

## 2021-08-19 VITALS — BP 142/74 | HR 63 | Ht 67.0 in | Wt 160.0 lb

## 2021-08-19 DIAGNOSIS — E7800 Pure hypercholesterolemia, unspecified: Secondary | ICD-10-CM | POA: Diagnosis not present

## 2021-08-19 DIAGNOSIS — I350 Nonrheumatic aortic (valve) stenosis: Secondary | ICD-10-CM | POA: Diagnosis not present

## 2021-08-19 DIAGNOSIS — I2510 Atherosclerotic heart disease of native coronary artery without angina pectoris: Secondary | ICD-10-CM | POA: Diagnosis not present

## 2021-08-19 NOTE — Progress Notes (Signed)
?Cardiology Office Note:   ? ?Date:  08/19/2021  ? ?ID:  Melvin Dunn, DOB [birth year], MRN [id number] ? ?PCP:  Shirline Frees, MD  ?Cardiologist:  Fransico Him, MD   ? ?Referring MD: Shirline Frees, MD  ? ?Chief Complaint  ?Patient presents with  ? Coronary Artery Disease  ? Hyperlipidemia  ? Aortic Stenosis  ? ? ?History of Present Illness:   ? ?Melvin Dunn is a 71 y.o. male with a hx of dyslipidemia, diabetes and ASCAD with cath revealing critical left main stenosis and multivessel ASCAD with normal LVF.  He underwent CABG with LIMA to LAD, SVG to OM, SVG to distal RCA/PL.   ? ?He also has a hx of moderate to severe AS with last echo 11/2018 showing normal LVF with EF 55-60% with moderate to severe AS with peak AV velocity of 339cm/s, mean AVG 55mHg, AVA 0.77cm2 and dimensionless index 0.29.  He was referred to Dr. TAli Loweand underwent cardiac catheterization revealing 70% proximal graft 1 lesion before RPDA followed by 80% proximal graft to lesion before RPDA and 60% distal graft lesion before RPDA, 80% mid LM, occluded mid LAD, 70% proximal LAD, occluded distal RCA and occluded ostial left circumflex with a patent LIMA to the LAD and SVG to the OM 2.  Mean right atrial pressure was 6 mmHg and mean wedge pressure was 15 mmHg.  He is currently undergoing evaluation for TAVR. ? ?He is here today for followup and is doing well.  He has some mild SOB that is very stable and unchanged.  He denies any chest pain or pressure,  PND, orthopnea, LE edema, dizziness, palpitations or syncope. he is compliant with his meds and is tolerating meds with no SE.    ? ?Past Medical History:  ?Diagnosis Date  ? [place]en[place]   ? of the prostate, 5% of single core 01/2011-watching and waiting for now  ? Aortic stenosis 02/11/2014  ? moderate by echo 06/2019  with Aortic valve mean gradient measures 28.5 mmHg. Aortic valve Vmax measures 3.64 m/s.  ? Asthma   ? no current inhaler use  ? CAD (coronary artery disease), native  coronary artery   ? LIMA to LAD, SVG to OM1, SVG to RCA-RPL, EVH via right thigh and leg  ? Colon polyp   ? Environmental allergies   ? History of kidney stones   ? Hyperlipidemia   ? Pre-diabetes   ? [place]   ? [place]   ? due to lidocaine allergy with prostate biopsy this year  ? ? ?Past Surgical History:  ?Procedure Laterality Date  ? CORONARY ARTERY BYPASS GRAFT N/A 02/13/2014  ? Procedure: CORONARY ARTERY BYPASS GRAFTING (CABG) TIMES FOUR USING LEFT INTERNAL MAMMARY ARTERY AND RIGHT SAPHENOUS LEG VEIN HARVESTED ENDOSCOPICALLY;  Surgeon: CRexene Alberts MD;  [place]  Service: Open [place] Surgery;  Laterality: N/A;  ? CYSTOSCOPY WITH LITHOLAPAXY N/A 03/17/2020  ? Procedure: CYSTOSCOPY WITH LITHOLAPAXY;  Surgeon: BRaynelle Bring MD;  [facility]  Service: Urology;  Laterality: N/A;  ? cytoscopy  15 yrs ago  ? EXTRACORPOREAL SHOCK WAVE LITHOTRIPSY  15 yrs ago  ? EYE SURGERY Bilateral 2015  ? cataracts  ? GOLD SEED IMPLANT N/A 03/17/2020  ? Procedure: GOLD SEED IMPLANT;  Surgeon: BRaynelle Bring MD;  [facility]  Service: Urology;  Laterality: N/A;  ? INTRAOPERATIVE TRANSESOPHAGEAL ECHOCARDIOGRAM N/A 02/13/2014  ? Procedure: INTRAOPERATIVE TRANSESOPHAGEAL ECHOCARDIOGRAM;  Surgeon: CRexene Alberts  MD;  Location: [facility];  Service: Open Heart Surgery;  Laterality: N/A;  ? LEFT HEART CATHETERIZATION WITH CORONARY ANGIOGRAM N/A 02/12/2014  ? Procedure: LEFT HEART CATHETERIZATION WITH CORONARY ANGIOGRAM;  Surgeon: Blane Ohara, MD;  Location[facility] C[facility] LAB;  Service: Cardiovascular;  Laterality: N/A;  ? left leg surgery for fracture  2004  ? plates inserted  ? PROSTATE BIOPSY  06/2019  ? RIGHT/LEFT HEART CATH AND CORONARY/GRAFT ANGIOGRAPHY N/A 07/31/2021  ? Procedure: RIGHT/LEFT HEART CATH AND CORONARY/GRAFT ANGIOGRAPHY;  Surgeon: Early Osmond, MD;  Location: [place] CV LAB;  Service: Cardiovascular;  Laterality: N/A;  ? SPACE OAR  INSTILLATION N/A 03/17/2020  ? Procedure: SPACE OAR INSTILLATION;  Surgeon: Raynelle Bring, MD;  Location: [facility];  Service: Urology;  Laterality: N/A;  ? ? ?Current Medications: ?Current Meds  ?Medication Sig  ? albuterol (VENTOLIN HFA) 108 (90 Base) MCG/ACT inhaler Inhale 1-2 puffs into the lungs daily as needed for wheezing or shortness of breath.  ? amoxicillin (AMOXIL) 500 MG capsule Take 500 mg by mouth 3 (three) times daily.  ? aspirin 81 MG chewable tablet Chew 81 mg by mouth daily.  ? atorvastatin (LIPITOR) 80 MG tablet TAKE 1 TABLET BY MOUTH EVERY DAY  ? fenofibrate (TRICOR) 145 MG tablet TAKE 1 TABLET BY MOUTH EVERY DAY  ? Garlic (GARLIQUE PO) Take 1 capsule by mouth daily.  ? icosapent Ethyl (VASCEPA) 1 g capsule Take 2 capsules (2 g total) by mouth 2 (two) times daily.  ? levocetirizine (XYZAL) 5 MG tablet Take 5 mg by mouth at bedtime as needed.  ? metoprolol tartrate (LOPRESSOR) 50 MG tablet Take 50 mg by mouth 2 (two) times daily.  ? NEXLIZET 180-10 MG TABS TAKE 1 TABLET BY MOUTH DAILY. PLEASE KEEP UPCOMING WITH DR. Radford Pax ON 08/19/21  ? nitroGLYCERIN (NITROSTAT) 0.4 MG SL tablet Place 1 tablet (0.4 mg total) under the tongue every 5 (five) minutes as needed for chest pain.  ?  ? ?Allergies:   Lidocaine and Promethazine hcl  ? ?Social History  ? ?Socioeconomic History  ? Marital status: Single  ?  Spouse name: Not on file  ? Number of children: Not on file  ? Years of education: Not on file  ? Highest education level: Not on file  ?Occupational History  ?  Comment: full time  ?Tobacco Use  ? Smoking status: Never  ? Smokeless tobacco: Never  ?Vaping Use  ? Vaping Use: Never used  ?Substance and Sexual Activity  ? Alcohol use: No  ? Drug use: No  ? Sexual activity: Yes  ?Other Topics Concern  ? Not on file  ?Social History Narrative  ? Not on file  ? ?Social Determinants of Health  ? ?Financial Resource Strain: Not on file  ?Food Insecurity: Not on file  ?Transportation Needs: Not on  file  ?Physical Activity: Not on file  ?Stress: Not on file  ?Social Connections: Not on file  ?  ? ?Family History: ?The patient's family history includes Alzheimer's disease in his mother; Cancer in his father; Cancer - Lung in his father; Heart attack in his maternal grandfather; Skin cancer in his mother; Stroke in his mother. There is no history of Breast cancer, Colon cancer, Prostate cancer, or Pancreatic cancer. ? ?ROS:   ?Please see the history of present illness.    ?ROS  ?All other systems reviewed and negative.  ? ?EKGs/Labs/Other Studies Reviewed:   ? ?The following studies were reviewed today: ?2D echo  11/2018, outside labs from [facility] ? ?EKG:  EKG is not ordered today.   ? ?Recent Labs: ?05/19/2021: ALT 34 ?07/24/2021: BUN 29; Creatinine, Ser 1.22; Platelets 290 ?07/31/2021: Hemoglobin 9.5; Potassium 3.2; Sodium 149  ? ?Recent Lipid Panel ?   ?Component Value Date/Time  ? CHOL 82 (L) 05/19/2021 0715  ? TRIG 229 (H) 05/19/2021 0715  ? HDL 8 (L) 05/19/2021 0715  ? CHOLHDL 10.3 (H) 05/19/2021 0715  ? CHOLHDL 3.6 05/03/2016 0909  ? VLDL 18 05/03/2016 0909  ? [place] 37 05/19/2021 0715  ? ? ?Physical Exam:   ? ?VS:  BP (!) 142/74   Pulse 63   Ht '5\' 7"'$  (1.702 m)   Wt 160 lb (72.6 kg)   SpO2 96%   BMI 25.06 kg/m?    ? ?Wt Readings from Last 3 Encounters:  ?08/19/21 160 lb (72.6 kg)  ?07/31/21 160 lb (72.6 kg)  ?07/24/21 160 lb 4 oz (72.7 kg)  ?  ? ?GEN: Well nourished, well developed in no acute distress ?HEENT: Normal ?NECK: No JVD; No carotid bruits ?LYMPHATICS: No lymphadenopathy ?CARDIAC:RRR, no  rubs, gallops 2/6 late peaking systolic murmur at the right upper sternal border rating to left lower sternal border ?RESPIRATORY:  Clear to auscultation without rales, wheezing or rhonchi  ?ABDOMEN: Soft, non-tender, non-distended ?MUSCULOSKELETAL:  No edema; No deformity  ?SKIN: Warm and dry ?NEUROLOGIC:  Alert and oriented x 3 ?PSYCHIATRIC:  Normal affect   ?ASSESSMENT:   ? ?1. Coronary artery disease involving  native coronary artery of native heart without angina pectoris   ?2. Pure hypercholesterolemia   ?3. Nonrheumatic aortic valve stenosis   ? ?PLAN:   ? ?In order of problems listed above: ? ?1.  ASCAD ?-cath 2015  cri

## 2021-08-19 NOTE — Addendum Note (Signed)
Addended by: Antonieta Iba on: 08/19/2021 03:03 PM ? ? Modules accepted: Orders ? ?

## 2021-08-19 NOTE — Patient Instructions (Signed)
Medication Instructions:  ?Your physician recommends that you continue on your current medications as directed. Please refer to the Current Medication list given to you today. ? ?*If you need a refill on your cardiac medications before your next appointment, please call your pharmacy* ? ? ?Lab Work: ?Come back for fasting lipids and ALT ?If you have labs (blood work) drawn today and your tests are completely normal, you will receive your results only by: ?MyChart Message (if you have MyChart) OR ?A paper copy in the mail ?If you have any lab test that is abnormal or we need to change your treatment, we will call you to review the results. ? ? ?Follow-Up: ?At [facility], you and your health needs are our priority.  As part of our continuing mission to provide you with exceptional heart care, we have created designated Provider Care Teams.  These Care Teams include your primary Cardiologist (physician) and Advanced Practice Providers (APPs -  Physician Assistants and Nurse Practitioners) who all work together to provide you with the care you need, when you need it. ? ?Your next appointment:   ?6 month(s) ? ?The format for your next appointment:   ?In Person ? ?Provider:   ?Fransico Him, MD   ? ?

## 2021-08-25 ENCOUNTER — Other Ambulatory Visit: Payer: Medicare Other | Admitting: *Deleted

## 2021-08-25 DIAGNOSIS — E7800 Pure hypercholesterolemia, unspecified: Secondary | ICD-10-CM

## 2021-08-25 DIAGNOSIS — I2510 Atherosclerotic heart disease of native coronary artery without angina pectoris: Secondary | ICD-10-CM

## 2021-08-25 DIAGNOSIS — I350 Nonrheumatic aortic (valve) stenosis: Secondary | ICD-10-CM | POA: Diagnosis not present

## 2021-08-25 LAB — LIPID PANEL
Chol/HDL Ratio: 16 ratio — ABNORMAL HIGH (ref 0.0–5.0)
Cholesterol, Total: 96 mg/dL — ABNORMAL LOW (ref 100–199)
HDL: 6 mg/dL — ABNORMAL LOW (ref >39)
LDL Chol Calc (NIH): 51 mg/dL (ref 0–99)
Triglycerides: 243 mg/dL — ABNORMAL HIGH (ref 0–149)
VLDL Cholesterol Cal: 39 mg/dL (ref 5–40)

## 2021-08-25 LAB — ALT: ALT: 29 IU/L (ref 0–44)

## 2021-10-05 ENCOUNTER — Encounter: Payer: Self-pay | Admitting: Physician Assistant

## 2021-10-06 ENCOUNTER — Other Ambulatory Visit: Payer: Self-pay

## 2021-10-06 DIAGNOSIS — I350 Nonrheumatic aortic (valve) stenosis: Secondary | ICD-10-CM

## 2021-10-07 ENCOUNTER — Institutional Professional Consult (permissible substitution): Payer: Medicare Other | Admitting: Surgery

## 2021-10-07 ENCOUNTER — Encounter: Payer: Self-pay | Admitting: Surgery

## 2021-10-07 VITALS — BP 137/79 | HR 56 | Resp 20 | Ht 67.0 in | Wt 160.0 lb

## 2021-10-07 DIAGNOSIS — I350 Nonrheumatic aortic (valve) stenosis: Secondary | ICD-10-CM

## 2021-10-07 NOTE — Progress Notes (Signed)
Patient ID: Melvin Dunn, male   DOB: May 27, 1950, 71 y.o.   MRN: [id number]  [facility]VE CLINIC         [address]       [place] [address]             [phone]          CARDIOTHORACIC SURGERY CONSULTATION REPORT  PCP is Shirline Frees, MD Referring Provider is Lenna Sciara, MD Primary Cardiologist is Fransico Him, MD  Reason for consultation:  Severe aortic stenosis  HPI:  The patient is a 71 year old gentleman with a history of hyperlipidemia, pre-diabetes, prostate cancer, coronary artery disease status post CABG x4 by Dr. Roxy Manns on 02/13/2014, and severe aortic stenosis that has been followed by Dr. Radford Pax.  He had an echocardiogram in February 2022 showing a mean gradient of 27 mmHg across aortic valve with a valve area of 1.28 cm consistent with moderate aortic stenosis.  Left ventricular ejection fraction was 65 to 70%.  He has now developed exertional shortness of breath and fatigue with moderate activity such as walking up stairs carrying something or walking up an incline.  He has had no symptoms at rest.  He denies any chest discomfort.  He has had no dizziness or syncope.  He denies peripheral edema.  His most recent echocardiogram on 07/03/2021 showed an increase in the mean aortic valve gradient to 41 mmHg with a valve area of 0.67 cm by VTI consistent with severe aortic stenosis.  There is mild aortic insufficiency.  Left ventricular ejection fraction is 65 to 70% with grade 1 diastolic dysfunction.  He is here today with his wife.  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  He does not exercise.  Past Medical History:  Diagnosis Date   Asthma    no current inhaler use   CAD (coronary artery disease), native coronary artery    LIMA to LAD, SVG to OM1, SVG to RCA-RPL, EVH via right thigh and leg   Colon polyp    Environmental allergies    History of kidney stones    Hyperlipidemia    Pre-diabetes     Prostate cancer ([place])    adenocarcimona, of the prostate, 5% of single core 01/2011-watching and waiting for now   Seizure [facility])    due to lidocaine allergy with prostate biopsy this year   Severe aortic stenosis     Past Surgical History:  Procedure Laterality Date   CORONARY ARTERY BYPASS GRAFT N/A 02/13/2014   Procedure: CORONARY ARTERY BYPASS GRAFTING (CABG) TIMES FOUR USING LEFT INTERNAL MAMMARY ARTERY AND RIGHT SAPHENOUS LEG VEIN HARVESTED ENDOSCOPICALLY;  Surgeon: Rexene Alberts, MD;  Location: [facility];  Service: Open Heart Surgery;  Laterality: N/A;   CYSTOSCOPY WITH LITHOLAPAXY N/A 03/17/2020   Procedure: CYSTOSCOPY WITH LITHOLAPAXY;  Surgeon: Raynelle Bring, MD;  Location: [facility];  Service: Urology;  Laterality: N/A;   cytoscopy  15 yrs ago   [place] LITHOTRIPSY  15 yrs ago   EYE SURGERY Bilateral 2015   cataracts   GOLD SEED IMPLANT N/A 03/17/2020   Procedure: GOLD SEED IMPLANT;  Surgeon: Raynelle Bring, MD;  Location: [facility];  Service: Urology;  Laterality: N/A;   INTRAOPERATIVE TRANSESOPHAGEAL ECHOCARDIOGRAM N/A 02/13/2014   Procedure: INTRAOPERATIVE TRANSESOPHAGEAL ECHOCARDIOGRAM;  Surgeon: Rexene Alberts, MD;  Location: [facility];  Service: Open Heart Surgery;  Laterality: N/A;   LEFT HEART  CATHETERIZATION WITH CORONARY ANGIOGRAM N/A 02/12/2014   Procedure: LEFT HEART CATHETERIZATION WITH CORONARY ANGIOGRAM;  Surgeon: Blane Ohara, MD;  Location: [facility] CATH LAB;  Service: Cardiovascular;  Laterality: N/A;   left leg surgery for fracture  2004   plates inserted   PROSTATE BIOPSY  06/2019   RIGHT/LEFT HEART CATH AND CORONARY/GRAFT ANGIOGRAPHY N/A 07/31/2021   Procedure: RIGHT/LEFT HEART CATH AND CORONARY/GRAFT ANGIOGRAPHY;  Surgeon: Early Osmond, MD;  Location: [place] CV LAB;  Service: Cardiovascular;  Laterality: N/A;   SPACE OAR INSTILLATION N/A 03/17/2020   Procedure: SPACE OAR INSTILLATION;  Surgeon: Raynelle Bring, MD;  Location: [facility];  Service: Urology;  Laterality: N/A;    Family History  Problem Relation Age of Onset   Alzheimer's disease Mother    Stroke Mother    Skin cancer Mother    Cancer - Lung Father    Cancer Father        asbestos   Heart attack Maternal Grandfather    Breast cancer Neg Hx    Colon cancer Neg Hx    Prostate cancer Neg Hx    Pancreatic cancer Neg Hx     Social History   Socioeconomic History   Marital status: Single    Spouse name: Not on file   Number of children: Not on file   Years of education: Not on file   Highest education level: Not on file  Occupational History    Comment: full time  Tobacco Use   Smoking status: Never   Smokeless tobacco: Never  Vaping Use   Vaping Use: Never used  Substance and Sexual Activity   Alcohol use: No   Drug use: No   Sexual activity: Yes  Other Topics Concern   Not on file  Social History Narrative   Not on file   Social Determinants of Health   Financial Resource Strain: Not on file  Food Insecurity: Not on file  Transportation Needs: Not on file  Physical Activity: Not on file  Stress: Not on file  Social Connections: Not on file  Intimate Partner Violence: Not on file    Prior to Admission medications   Medication Sig Start Date End Date Taking? Authorizing Provider  albuterol (VENTOLIN HFA) 108 (90 Base) MCG/ACT inhaler Inhale 1-2 puffs into the lungs daily as needed for wheezing or shortness of breath.    [provider]  amoxicillin (AMOXIL) 500 MG capsule Take 500 mg by mouth 3 (three) times daily. Patient not taking: Reported on 10/07/2021 08/14/21   [provider]  aspirin 81 MG chewable tablet Chew 81 mg by mouth daily.    [provider]  atorvastatin (LIPITOR) 80 MG tablet TAKE 1 TABLET BY MOUTH EVERY DAY 05/13/21   Sueanne Margarita, MD  fenofibrate (TRICOR) 145 MG tablet TAKE 1 TABLET BY MOUTH EVERY DAY 06/22/21   Lenna Sciara, NP   Garlic (GARLIQUE PO) Take 1 capsule by mouth daily.    [provider]  icosapent Ethyl (VASCEPA) 1 g capsule Take 2 capsules (2 g total) by mouth 2 (two) times daily. 06/12/21   Sueanne Margarita, MD  levocetirizine (XYZAL) 5 MG tablet Take 5 mg by mouth at bedtime as needed. 06/12/19   [provider]  metoprolol tartrate (LOPRESSOR) 50 MG tablet Take 50 mg by mouth 2 (two) times daily. 05/20/17   [provider]  NEXLIZET 180-10 MG TABS TAKE 1 TABLET BY MOUTH DAILY. PLEASE KEEP UPCOMING WITH  DR. Radford Pax ON 08/19/21 08/19/21   Sueanne Margarita, MD  nitroGLYCERIN (NITROSTAT) 0.4 MG SL tablet Place 1 tablet (0.4 mg total) under the tongue every 5 (five) minutes as needed for chest pain. 01/24/15   Sueanne Margarita, MD    Current Outpatient Medications  Medication Sig Dispense Refill   albuterol (VENTOLIN HFA) 108 (90 Base) MCG/ACT inhaler Inhale 1-2 puffs into the lungs daily as needed for wheezing or shortness of breath.     amoxicillin (AMOXIL) 500 MG capsule Take 500 mg by mouth 3 (three) times daily. (Patient not taking: Reported on 10/07/2021)     aspirin 81 MG chewable tablet Chew 81 mg by mouth daily.     atorvastatin (LIPITOR) 80 MG tablet TAKE 1 TABLET BY MOUTH EVERY DAY 30 tablet 11   fenofibrate (TRICOR) 145 MG tablet TAKE 1 TABLET BY MOUTH EVERY DAY 90 tablet 3   Garlic (GARLIQUE PO) Take 1 capsule by mouth daily.     icosapent Ethyl (VASCEPA) 1 g capsule Take 2 capsules (2 g total) by mouth 2 (two) times daily. 120 capsule 11   levocetirizine (XYZAL) 5 MG tablet Take 5 mg by mouth at bedtime as needed.     metoprolol tartrate (LOPRESSOR) 50 MG tablet Take 50 mg by mouth 2 (two) times daily.     NEXLIZET 180-10 MG TABS TAKE 1 TABLET BY MOUTH DAILY. PLEASE KEEP UPCOMING WITH DR. Radford Pax ON 08/19/21 30 tablet 2   nitroGLYCERIN (NITROSTAT) 0.4 MG SL tablet Place 1 tablet (0.4 mg total) under the tongue every 5 (five) minutes as needed for chest pain. 25 tablet 3   No current  facility-administered medications for this visit.   Facility-Administered Medications Ordered in Other Visits  Medication Dose Route Frequency Provider Last Rate Last Admin   sodium phosphate (FLEET) 7-19 GM/118ML enema 1 enema  1 enema Rectal Once Raynelle Bring, MD        Allergies  Allergen Reactions   Lidocaine     Seizure with biopsy with lidocaine this year   Promethazine Hcl     Other reaction(s): stomach upset      Review of Systems:   General:  normal appetite, + decreased energy, no weight gain, no weight loss, no fever  Cardiac:  no chest pain with exertion, no chest pain at rest, +SOB with moderate exertion, no resting SOB, no PND, no orthopnea, no palpitations, no arrhythmia, no atrial fibrillation, no LE edema, no dizzy spells, no syncope  Respiratory:  + exertional shortness of breath, no home oxygen, no productive cough, no dry cough, no bronchitis, no wheezing, no hemoptysis, no asthma, no pain with inspiration or cough, no sleep apnea, no CPAP at night  GI:   no difficulty swallowing, no reflux, no frequent heartburn, no hiatal hernia, no abdominal pain, no constipation, no diarrhea, no hematochezia, no hematemesis, no melena  GU:   no dysuria,  no frequency, no urinary tract infection, no hematuria, no enlarged prostate, + hx of kidney stones, no kidney disease  Vascular:  no pain suggestive of claudication, no pain in feet, no leg cramps, no varicose veins, no DVT, no non-healing foot ulcer  Neuro:   no stroke, no TIA's, no seizures, no headaches, no temporary blindness one eye,  no slurred speech, no peripheral neuropathy, no chronic pain, no instability of gait, no memory/cognitive dysfunction  Musculoskeletal: no arthritis, no joint swelling, no myalgias, no difficulty walking, normal mobility   Skin:   no rash, no itching, no  skin infections, no pressure sores or ulcerations  Psych:   no anxiety, no depression, no nervousness, no unusual recent stress  Eyes:   no  blurry vision, no floaters, no recent vision changes, + wears glasses  ENT:   no hearing loss, no loose or painful teeth, no dentures, last saw dentist 08/2021  Hematologic:  no easy bruising, no abnormal bleeding, no clotting disorder, no frequent epistaxis  Endocrine:  no diabetes, does not check CBG's at home     Physical Exam:   BP 137/79   Pulse (!) 56   Resp 20   Ht '5\' 7"'$  (1.702 m)   Wt 160 lb (72.6 kg)   SpO2 97% Comment: RA  BMI 25.06 kg/m   General:  well-appearing  HEENT:  Unremarkable, NCAT, PERLA, EOMI  Neck:   no JVD, no bruits, no adenopathy   Chest:   clear to auscultation, symmetrical breath sounds, no wheezes, no rhonchi   CV:   RRR, 3/6 sytolic murmur RSB  Abdomen:  soft, non-tender, no masses   Extremities:  warm, well-perfused, pulses palpable at ankle, no lower extremity edema  Rectal/GU  Deferred  Neuro:   Grossly non-focal and symmetrical throughout  Skin:   Clean and dry, no rashes, no breakdown  Diagnostic Tests:  ECHOCARDIOGRAM REPORT         Patient Name:   Melvin Dunn Date of Exam: 07/03/2021  Medical Rec #:  [id number]   Height:       66.0 in  Accession #:    [id number]  Weight:       165.7 lb  Date of Birth:  [birth year]  BSA:          1.846 m  Patient Age:    60 years    BP:           142/66 mmHg  Patient Gender: M           HR:           65 bpm.  Exam Location:  Outpatient   Procedure: 2D Echo, Cardiac Doppler, Color Doppler and Strain Analysis                                   MODIFIED REPORT:   This report was modified by Lyman Bishop MD on 07/03/2021 due to Left out  IVC                                    measurement.   Indications:     R06.9 DOE; I35.2 Nonrheumatic aortic (valve) stenosis  with                   insufficiency     History:         Patient has prior history of Echocardiogram examinations,  most                   recent 07/01/2020. CAD, Prior CABG, Aortic Valve Disease,                   Signs/Symptoms:Dyspnea; Risk  Factors:Diabetes,  Dyslipidemia and                   Non-Smoker. Patient denies chest pain and leg edema. he  does  have DOE intermittently.     Sonographer:     Salvadore Dom RVT, RDCS (AE), RDMS  Referring Phys:  [place]  Diagnosing Phys: Lyman Bishop MD   IMPRESSIONS     1. Left ventricular ejection fraction, by estimation, is 65 to 70%. Left  ventricular ejection fraction by 2D MOD biplane is 66.9 %. The left  ventricle has normal function. The left ventricle has no regional wall  motion abnormalities. Left ventricular  diastolic parameters are consistent with Grade I diastolic dysfunction  (impaired relaxation). Elevated left ventricular end-diastolic pressure.   2. Low normal RV free wall strain (-24.6%). Right ventricular systolic  function is low normal. The right ventricular size is normal. There is  normal pulmonary artery systolic pressure. The estimated right ventricular  systolic pressure is 33.3 mmHg.   3. The mitral valve is abnormal. Trivial mitral valve regurgitation.   4. The aortic valve is tricuspid. There is moderate calcification of the  aortic valve. There is moderate thickening of the aortic valve. Aortic  valve regurgitation is mild. Severe aortic valve stenosis. Aortic  regurgitation PHT measures 431 msec. Aortic   valve area, by VTI measures 0.67 cm. Aortic valve mean gradient measures  41.0 mmHg. Aortic valve Vmax measures 4.63 m/s. DI of 0.24.   5. The inferior vena cava is normal in size with greater than 50%  respiratory variability, suggesting right atrial pressure of 3 mmHg.   Comparison(s): Changes from prior study are noted. 07/01/2020: LVEF 65%,  moderate AS mean 26.7 mmHg, peak 52.8 mmHg.   Conclusion(s)/Recommendation(s): Would recommend symptomatic correlation  and evaluation for aortic valve replacement.   FINDINGS   Left Ventricle: Left ventricular ejection fraction, by estimation, is 65  to 70%. Left  ventricular ejection fraction by 2D MOD biplane is 66.9 %.  The left ventricle has normal function. The left ventricle has no regional  wall motion abnormalities. The  left ventricular internal cavity size was normal in size. There is no left  ventricular hypertrophy. Left ventricular diastolic parameters are  consistent with Grade I diastolic dysfunction (impaired relaxation).  Elevated left ventricular end-diastolic  pressure.   Right Ventricle: Low normal RV free wall strain (-24.6%). The right  ventricular size is normal. No increase in right ventricular wall  thickness. Right ventricular systolic function is low normal. There is  normal pulmonary artery systolic pressure. The  tricuspid regurgitant velocity is 2.73 m/s, and with an assumed right  atrial pressure of 3 mmHg, the estimated right ventricular systolic  pressure is 54.5 mmHg.   Left Atrium: Left atrial size was normal in size.   Right Atrium: Right atrial size was normal in size.   Pericardium: There is no evidence of pericardial effusion.   Mitral Valve: The mitral valve is abnormal. There is mild calcification of  the mitral valve leaflet(s). Trivial mitral valve regurgitation.   Tricuspid Valve: The tricuspid valve is grossly normal. Tricuspid valve  regurgitation is trivial.   Aortic Valve: The aortic valve is tricuspid. There is moderate  calcification of the aortic valve. There is moderate thickening of the  aortic valve. Aortic valve regurgitation is mild. Aortic regurgitation PHT  measures 431 msec. Severe aortic stenosis is  present. Aortic valve mean gradient measures 41.0 mmHg. Aortic valve peak  gradient measures 85.7 mmHg. Aortic valve area, by VTI measures 0.67 cm.   Pulmonic Valve: The pulmonic valve was grossly normal. Pulmonic valve  regurgitation is trivial.   Aorta: The aortic root and  ascending aorta are structurally normal, with  no evidence of dilitation.   Venous: The inferior vena  cava is normal in size with greater than 50%  respiratory variability, suggesting right atrial pressure of 3 mmHg.   IAS/Shunts: No atrial level shunt detected by color flow Doppler.      LEFT VENTRICLE  PLAX 2D                        Biplane EF (MOD)  LVIDd:         4.80 cm         LV Biplane EF:   Left  LVIDs:         3.11 cm                          ventricular  LV PW:         1.09 cm                          ejection  LV IVS:        0.79 cm                          fraction by  LVOT diam:     1.90 cm                          2D MOD  LV SV:         67                               biplane is  LV SV Index:   36                               66.9 %.  LVOT Area:     2.84 cm                                 Diastology                                 LV e' medial:    5.63 cm/s  LV Volumes (MOD)               LV E/e' medial:  17.7  LV vol d, MOD    65.1 ml       LV e' lateral:   8.63 cm/s  A2C:                           LV E/e' lateral: 11.6  LV vol d, MOD    60.1 ml  A4C:  LV vol s, MOD    21.2 ml  A2C:  LV vol s, MOD    19.5 ml       3D Volume EF:  A4C:                           3D EF:        59 %  LV SV MOD A2C:   43.9 ml  LV EDV:       133 ml  LV SV MOD A4C:   60.1 ml       LV ESV:       55 ml  LV SV MOD BP:    42.5 ml       LV SV:        78 ml   RIGHT VENTRICLE  RV S prime:     10.20 cm/s  TAPSE (M-mode): 1.6 cm   LEFT ATRIUM             Index        RIGHT ATRIUM           Index  LA diam:        4.00 cm 2.17 cm/m   RA Area:     14.60 cm  LA Vol (A2C):   70.4 ml 38.13 ml/m  RA Volume:   36.50 ml  19.77 ml/m  LA Vol (A4C):   50.4 ml 27.30 ml/m  LA Biplane Vol: 61.2 ml 33.15 ml/m   AORTIC VALVE                     PULMONIC VALVE  AV Area (Vmax):    0.68 cm      PV Vmax:          1.38 m/s  AV Area (Vmean):   0.71 cm      PV Peak grad:     7.7 mmHg  AV Area (VTI):     0.67 cm      PR End Diast Vel: 2.78 msec  AV Vmax:           463.00 cm/s  AV Vmean:           284.000 cm/s  AV VTI:            0.999 m  AV Peak Grad:      85.7 mmHg  AV Mean Grad:      41.0 mmHg  LVOT Vmax:         111.00 cm/s  LVOT Vmean:        71.200 cm/s  LVOT VTI:          0.237 m  LVOT/AV VTI ratio: 0.24  AI PHT:            431 msec  AR Vena Contracta: 0.30 cm     AORTA  Ao Root diam: 3.40 cm  Ao Asc diam:  3.60 cm  Ao Arch diam: 3.9 cm   MITRAL VALVE               TRICUSPID VALVE  MV Area (PHT): 3.85 cm    TR Peak grad:   29.8 mmHg  MV Decel Time: 197 msec    TR Vmax:        273.00 cm/s  MV E velocity: 99.80 cm/s  MV A velocity: 86.60 cm/s  SHUNTS  MV E/A ratio:  1.15        Systemic VTI:  0.24 m                             Systemic Diam: 1.90 cm   Lyman Bishop MD  Electronically signed by Lyman Bishop MD  Signature Date/Time: 07/03/2021/3:25:00 PM         Final (Updated)     Physicians  Panel Physicians Referring Physician Case Authorizing Physician  Early Osmond, MD (Primary)  Procedures  RIGHT/LEFT HEART CATH AND CORONARY/GRAFT ANGIOGRAPHY   Conclusion      Prox Graft-1 lesion before RPDA  is 70% stenosed.   Prox Graft-2 lesion before RPDA  is 80% stenosed.   Dist Graft lesion before RPDA  is 65% stenosed.   Mid LM lesion is 80% stenosed.   Mid LAD lesion is 100% stenosed.   Prox LAD lesion is 70% stenosed.   Dist RCA lesion is 100% stenosed.   Ost Cx lesion is 100% stenosed.   1.  Patent LIMA to LAD and vein graft to second obtuse marginal.  The sequential vein graft to the right coronary artery and R PLV branch has serial high-grade stenoses.  Given the fact the patient has no angina should be treated with medical therapy for now. 2.  Severe native multivessel disease. 3.  Normal cardiac output of 8.8 L/min and index of 4.7 L/min/m with mean right atrial pressure of 6 mmHg and mean wedge pressure of 15 mmHg. 4.  Capacious iliofemoral vessels bilaterally with femoral bifurcation on the left side at the mid femoral head.    Recommendations: Continue evaluation for aortic valve intervention.   Indications  Nonrheumatic aortic valve stenosis [I35.0 (ICD-10-CM)]   Clinical Presentation  CHF/Shock Congestive heart failure not present. No shock present.   Procedural Details  Technical Details The patient is a 71 year old male with a history of coronary artery disease status post CABG consisting of a LIMA to LAD, vein graft to obtuse marginal 2, and vein graft to right coronary artery sequential to R PLV branch, hypertension, hyperlipidemia, and severe aortic stenosis was evaluated in an outpatient setting due to ongoing dyspnea.  He is referred for preprocedural right heart catheterization and coronary angiography and bypass angiography assessment.  After obtaining consent the patient was brought to the cardiac catheterization laboratory prepped draped sterile fashion ultrasound was used to gain access to the left radial artery and a 6 French glide sheath placed there.  '5mg'$  of verapamil and 5000 units of heparin were administered through the sheath.  A previously placed antecubital IV was exchanged for a 5 French femoral glide sheath.  Right heart catheterization, coronary and bypass angiography study was then performed.  Peripheral angiography was also performed.  At the conclusion of the procedure manual pressure was applied to the antecubital site and a TR band was placed on the radial site.  Estimated blood loss <50 mL.   During this procedure medications were administered to achieve and maintain moderate conscious sedation while the patient's heart rate, blood pressure, and oxygen saturation were continuously monitored and I was present face-to-face 100% of this time.   Medications (Filter: Administrations occurring from 0727 to 0850 on 07/31/21) Heparin (Porcine) in NaCl 1000-0.9 UT/500ML-% SOLN (mL) Total volume:  1,000 mL Date/Time Rate/Dose/Volume Action   07/31/21 0733 500 mL Given   0733 500 mL Given     midazolam (VERSED) injection (mg) Total dose:  1 mg Date/Time Rate/Dose/Volume Action   07/31/21 0747 1 mg Given    fentaNYL (SUBLIMAZE) injection (mcg) Total dose:  25 mcg Date/Time Rate/Dose/Volume Action   07/31/21 0748 25 mcg Given    tetracaine 1 % injection (mg) Total dose:  2 mg Date/Time Rate/Dose/Volume Action   07/31/21 0751 1 mg Given   0752 1 mg Given    Radial Cocktail/Verapamil only (mL) Total volume:  10 mL Date/Time Rate/Dose/Volume Action   07/31/21 0753 10 mL Given    heparin sodium (porcine) injection (Units) Total dose:  5,000 Units Date/Time Rate/Dose/Volume Action   07/31/21 0756 5,000 Units Given    iohexol (OMNIPAQUE) 350 MG/ML injection (mL) Total volume:  130 mL Date/Time Rate/Dose/Volume Action   07/31/21 0840 130 mL Given    Sedation Time  Sedation Time Physician-1: 49 minutes 20 seconds Contrast  Medication Name Total Dose  iohexol (OMNIPAQUE) 350 MG/ML injection 130 mL   Radiation/Fluoro  Fluoro time: 11.4 (min) DAP: 14.4 (Gycm2) Cumulative Air Kerma: 681.2 (mGy) Complications  Complications documented before study signed (07/31/2021  7:51 AM)   No complications were associated with this study.  Documented by Evern Bio, RN - 07/31/2021  8:42 AM     Coronary Findings  Diagnostic Dominance: Right Left Main  Mid LM lesion is 80% stenosed.    Left Anterior Descending  Prox LAD lesion is 70% stenosed.  Mid LAD lesion is 100% stenosed.    Left Circumflex  Ost Cx lesion is 100% stenosed.    Right Coronary Artery  Vessel is small. There is moderate diffuse disease throughout the vessel.  Dist RCA lesion is 100% stenosed.    Sequential Graft To RPDA, 2nd RPL  Prox Graft-1 lesion before RPDA is 70% stenosed.  Prox Graft-2 lesion before RPDA is 80% stenosed.  Dist Graft lesion before RPDA is 65% stenosed.    Graft To 2nd Mrg    LIMA Graft To Dist LAD    Intervention   No interventions have been documented.    Coronary Diagrams  Diagnostic Dominance: Right Intervention  Implants     No implant documentation for this case.   [employer] Images   Show images for CARDIAC CATHETERIZATION Images on Long Term Storage   Show images for Melvyn, Hommes to Procedure Log  Procedure Log    Hemo Data  Flowsheet Row Most Recent Value  Fick Cardiac Output 8.84 L/min  Fick Cardiac Output Index 4.79 (L/min)/BSA  RA A Wave 10 mmHg  RA V Wave 11 mmHg  RA Mean 6 mmHg  RV Systolic Pressure 36 mmHg  RV Diastolic Pressure 0 mmHg  RV EDP 11 mmHg  PA Systolic Pressure 31 mmHg  PA Diastolic Pressure 10 mmHg  PA Mean 22 mmHg  PW A Wave 17 mmHg  PW V Wave 21 mmHg  PW Mean 15 mmHg  AO Systolic Pressure 700 mmHg  AO Diastolic Pressure 59 mmHg  AO Mean 88 mmHg  QP/QS 0.74  TPVR Index 6.21 HRUI  TSVR Index 18.36 HRUI  PVR SVR Ratio 0.12  TPVR/TSVR Ratio 0.34    ADDENDUM REPORT: 08/05/2021 05:02   ADDENDUM: Extracardiac findings will be described separately under dictation for contemporaneously obtained CTA chest, abdomen and pelvis dated 08/04/2021. Please see that report for full description of relevant extracardiac findings.     Electronically Signed   By: Vinnie Langton M.D.   On: 08/05/2021 05:02    Addended by Etheleen Mayhew, MD on 08/05/2021  5:04 AM   Study Result  Narrative & Impression  CLINICAL DATA:  Aortic Stenosis   EXAM: Cardiac TAVR CT   TECHNIQUE: The patient was scanned on a Siemens Force 174 slice scanner. A 120 kV retrospective scan was triggered in the ascending thoracic aorta at 140 HU's. Gantry rotation speed was 250 msecs and collimation was .6 mm. No beta blockade or nitro were given. The 3D data set was reconstructed in 5% intervals of the R-R cycle. Systolic and diastolic phases were analyzed on a dedicated work station using MPR, MIP and VRT  modes. The patient received 80 cc of contrast.   FINDINGS: Aortic Valve: Tri leaflet AV with calcium  score 1445 somewhat low Planimeter AVA 1.6 cm 2   Aorta: No aneurysm Significant protruding soft plaque in descending thoracic aorta   Sino-tubular Junction: 25 mm   Ascending Thoracic Aorta: 32 mm   Aortic Arch: 27 mm   Descending Thoracic Aorta: 27 mm   Sinus of Valsalva Measurements:   Non-coronary: 30.6 mm   Right - coronary: 28.2 mm  Height 16.6 mm   Left -   coronary: 30.7 mm Height 16.4  mm   Coronary Artery Height above Annulus:   Left Main: 11.2 mm above annulus   Right Coronary: 13.4 mm above annulus   Virtual Basal Annulus Measurements:   Maximum / Minimum Diameter: 26.6 mm x 20.4 mm   Perimeter: 73.3 mm   Area: 421.6 mm2   Coronary Arteries: Sufficient height above annulus for deployment Patent SVG to RCA, Patent SVG to OM, Patent LIMA to LAD   Optimum Fluoroscopic Angle for Delivery: LAO 11 Caudal 11 degrees   IMPRESSION: 1.  Tri leaflet AV with score 1445 and AVA by planimetry 1.6 cm2   2. Optimum angiographic angle for deployment LAO 11 Caudal 11 degrees   3. Coronary arteries sufficient height above annulus for deployment Patent SVG's to RCA/OM Patent LIMA to LAD   4. Annular area of 421.6 mm2 suitable for a 23 mm Sapien 3 valve Right sinus diameter a bit small for 29 mm Medtronic Evolut Valve   5. Protruding soft plaque in descending thoracic aorta may be a source of embolus with device advancement   Jenkins Rouge   Electronically Signed: By: Jenkins Rouge M.D. On: 08/04/2021 11:52      Narrative & Impression  CLINICAL DATA:  71 year old male with history of severe aortic stenosis. Preprocedural study prior to potential transcatheter aortic valve replacement (TAVR) procedure.   EXAM: CT ANGIOGRAPHY CHEST, ABDOMEN AND PELVIS   TECHNIQUE: Multidetector CT imaging through the chest, abdomen and pelvis was performed using the standard protocol during bolus administration of intravenous contrast. Multiplanar reconstructed images  and MIPs were obtained and reviewed to evaluate the vascular anatomy.   RADIATION DOSE REDUCTION: This exam was performed according to the departmental dose-optimization program which includes automated exposure control, adjustment of the mA and/or kV according to patient size and/or use of iterative reconstruction technique.   CONTRAST:  53m OMNIPAQUE IOHEXOL 350 MG/ML SOLN   COMPARISON:  No priors.   FINDINGS: CTA CHEST FINDINGS   Cardiovascular: Heart size is borderline enlarged. There is no significant pericardial fluid, thickening or pericardial calcification. There is aortic atherosclerosis, as well as atherosclerosis of the great vessels of the mediastinum and the coronary arteries, including calcified atherosclerotic plaque in the left main, left anterior descending, left circumflex and right coronary arteries. Status post median sternotomy for CABG including LIMA to the LAD. Severe thickening and calcification of the aortic valve.   Mediastinum/Lymph Nodes: No pathologically enlarged mediastinal or hilar lymph nodes. Esophagus is unremarkable in appearance. No axillary lymphadenopathy.   Lungs/Pleura: No acute consolidative airspace disease. No pleural effusions. No suspicious appearing pulmonary nodules or masses are noted.   Musculoskeletal/Soft Tissues: Median sternotomy wires. There are no aggressive appearing lytic or blastic lesions noted in the visualized portions of the skeleton.   CTA ABDOMEN AND PELVIS FINDINGS   Hepatobiliary: Multiple small low-attenuation lesions are again noted in the liver, largest of which are compatible with simple cysts,  measuring up to 2.2 cm in the inferior aspect of segment 3 of the liver (axial image 123 of series 15). Smaller subcentimeter low-attenuation lesions are also stable compared to the prior study and too small to characterize, but statistically likely to represent tiny cysts. No suspicious hepatic lesions. No  intra or extrahepatic biliary ductal dilatation. Gallbladder is normal in appearance.   Pancreas: No pancreatic mass. No pancreatic ductal dilatation. No pancreatic or peripancreatic fluid collections or inflammatory changes.   Spleen: Unremarkable.   Adrenals/Urinary Tract: Multiple nonobstructive calculi are noted within the right renal collecting system, measuring up to 6 mm in the upper pole. Multiple low-attenuation lesions in the left kidney are compatible with simple cysts, largest of which is located in the posterior aspect of the interpolar region measuring 3.9 cm in diameter. Other subcentimeter low-attenuation lesions in the left kidney are too small to definitively characterize, but also favored to represent small cysts. No hydroureteronephrosis. Urinary bladder is normal in appearance.   Stomach/Bowel: The appearance of the stomach is normal. There is no pathologic dilatation of small bowel or colon. Numerous colonic diverticulae are noted, particularly in the descending colon and sigmoid colon, without surrounding inflammatory changes to suggest an acute diverticulitis at this time. Normal appendix.   Vascular/Lymphatic: Aortic atherosclerosis, with vascular findings and measurements pertinent to potential TAVR procedure, as detailed below. No aneurysm or dissection noted in the abdominal or pelvic vasculature. No lymphadenopathy noted in the abdomen or pelvis.   Reproductive: Fiducial markers in the prostate gland. Seminal vesicles are unremarkable in appearance.   Other: No significant volume of ascites.  No pneumoperitoneum.   Musculoskeletal: There are no aggressive appearing lytic or blastic lesions noted in the visualized portions of the skeleton.   VASCULAR MEASUREMENTS PERTINENT TO TAVR:   AORTA:   Minimal Aortic Diameter-9 x 9 mm   Severity of Aortic Calcification-severe   RIGHT PELVIS:   Right Common Iliac Artery -   Minimal Diameter-7.6 x 7.1  mm   Tortuosity-mild   Calcification-mild   Right External Iliac Artery -   Minimal Diameter-6.0 x 5.4 mm   Tortuosity-mild-to-moderate   Calcification-none   Right Common Femoral Artery -   Minimal Diameter-6.0 x 5.5 mm   Tortuosity-mild   Calcification-mild   LEFT PELVIS:   Left Common Iliac Artery -   Minimal Diameter-8.1 x 6.5 mm   Tortuosity-mild   Calcification-mild   Left External Iliac Artery -   Minimal Diameter-6.0 x 5.7 mm   Tortuosity-mild-to-moderate   Calcification-none   Left Common Femoral Artery -   Minimal Diameter-5.6 x 6.2 mm   Tortuosity-mild   Calcification-mild   Review of the MIP images confirms the above findings.   IMPRESSION: 1. Vascular findings and measurements pertinent to potential TAVR procedure, as detailed above. 2. Severe thickening and calcification of the aortic valve, compatible with reported clinical history of severe aortic stenosis. 3. Aortic atherosclerosis, in addition to left main and three-vessel coronary artery disease. Status post median sternotomy for CABG including LIMA to the LAD. 4. Colonic diverticulosis without evidence of acute diverticulitis at this time. 5. Additional incidental findings, as above.     Electronically Signed   By: Vinnie Langton M.D.   On: 08/05/2021 05:34     Impression:  This 71 year old gentleman has stage D, severe, symptomatic aortic stenosis with New York Heart Association class II symptoms of exertional fatigue and shortness of breath consistent with chronic diastolic congestive heart failure.  I have personally reviewed his 2D  echo, cardiac catheterization, and CTA studies.  His echocardiogram shows a calcified and thickened aortic valve with restricted mobility.  The mean gradient is 41 mmHg with a valve area of 0.67 cm and a dimensionless index of 0.24.  There is mild aortic insufficiency.  Left ventricular ejection fraction is normal.  Cardiac catheterization  shows a patent left internal mammary graft to the LAD and a patent saphenous vein graft to the second obtuse marginal.  The sequential vein graft to the distal right coronary artery and right posterolateral branch has serial high-grade stenoses but since he is not having any chest discomfort it is felt that this could be treated medically for now.  I agree that aortic valve replacement is indicated in this patient for relief of his symptoms and to prevent progressive left ventricular deterioration. Given his age and prior coronary bypass surgery I think that transcatheter aortic valve replacement would be the best option.  His gated cardiac CTA shows anatomy suitable for TAVR using a 23 mm Edwards SAPIEN 3 valve.  His abdominal and pelvic CTA shows adequate pelvic vascular anatomy for transfemoral access but there is significant protruding soft plaque in the descending thoracic aorta that may be a source of embolus with device advancement.  We reviewed this with our multidisciplinary heart valve group and felt that the best option would be to use alternative access through the left common carotid artery which has no significant disease and a straight course to the aortic valve.  The patient and his wife for counseled at length regarding treatment alternatives for management of severe symptomatic aortic stenosis. The risks and benefits of surgical intervention has been discussed in detail. Long-term prognosis with medical therapy was discussed. Alternative approaches such as conventional surgical aortic valve replacement, transcatheter aortic valve replacement, and palliative medical therapy were compared and contrasted at length. This discussion was placed in the context of the patient's own specific clinical presentation and past medical history. All of their questions have been addressed.   Following the decision to proceed with transcatheter aortic valve replacement, a discussion was held regarding what types  of management strategies would be attempted intraoperatively in the event of life-threatening complications, including whether or not the patient would be considered a candidate for the use of cardiopulmonary bypass and/or conversion to open sternotomy for attempted surgical intervention.  The patient is relatively young and I would consider emergent sternotomy to manage any intraoperative complications depending on the situation.  The patient is aware of the fact that transient use of cardiopulmonary bypass may be necessary. The patient has been advised of a variety of complications that might develop including but not limited to risks of death, stroke, paravalvular leak, aortic dissection or other major vascular complications, aortic annulus rupture, device embolization, cardiac rupture or perforation, mitral regurgitation, acute myocardial infarction, arrhythmia, heart block or bradycardia requiring permanent pacemaker placement, congestive heart failure, respiratory failure, renal failure, pneumonia, infection, other late complications related to structural valve deterioration or migration, or other complications that might ultimately cause a temporary or permanent loss of functional independence or other long term morbidity. The patient provides full informed consent for the procedure as described and all questions were answered.      Plan:  He will be scheduled for transcatheter aortic valve replacement using a SAPIEN 3 valve via left carotid artery approach on 10/13/2021.  I spent 60 minutes performing this consultation and > 50% of this time was spent face to face counseling and coordinating the care  of this patient's severe symptomatic aortic stenosis.   Gaye Pollack, MD 10/07/2021

## 2021-10-08 NOTE — Pre-Procedure Instructions (Signed)
Surgical Instructions    Your procedure is scheduled on Tuesday, May 30th.  Report to [facility] Main Entrance "A" at 08:45 A.M., then check in with the Admitting office.  Call this number if you have problems the morning of surgery:  [phone]   If you have any questions prior to your surgery date call [phone]: Open Monday-Friday 8am-4pm    Remember:  Do not eat or drink after midnight the night before your surgery   STOP now taking any Aleve, Naproxen, Ibuprofen, Motrin, Advil, Goody's, BC's, all herbal medications, fish oil, and all non-prescription vitamins.   Continue taking all other medications including Aspirin without change through the day before surgery. On the morning of surgery do not take any medications. If needed you can use inhaler the morning of surgery.                      Do NOT Smoke (Tobacco/Vaping) for 24 hours prior to your procedure.  If you use a CPAP at night, you may bring your mask/headgear for your overnight stay.   Contacts, glasses, piercing's, hearing aid's, dentures or partials may not be worn into surgery, please bring cases for these belongings.    For patients admitted to the hospital, discharge time will be determined by your treatment team.   Patients discharged the day of surgery will not be allowed to drive home, and someone needs to stay with them for 24 hours.  SURGICAL WAITING ROOM VISITATION Patients having surgery or a procedure may have two support people in the waiting room. These visitors may be switched out with other visitors if needed. Children under the age of 12 must have an adult accompany them who is not the patient. If the patient needs to stay at the hospital during part of their recovery, the visitor guidelines for inpatient rooms apply.  Please refer to the [facility] website for the visitor guidelines for Inpatients (after your surgery is over and you are in a regular room).    Special instructions:   [facility]- Preparing For Surgery  Before surgery, you can play an important role. Because skin is not sterile, your skin needs to be as free of germs as possible. You can reduce the number of germs on your skin by washing with CHG (chlorahexidine gluconate) Soap before surgery.  CHG is an antiseptic cleaner which kills germs and bonds with the skin to continue killing germs even after washing.    Oral Hygiene is also important to reduce your risk of infection.  Remember - BRUSH YOUR TEETH THE MORNING OF SURGERY WITH YOUR REGULAR TOOTHPASTE  Please do not use if you have an allergy to CHG or antibacterial soaps. If your skin becomes reddened/irritated stop using the CHG.  Do not shave (including legs and underarms) for at least 48 hours prior to first CHG shower. It is OK to shave your face.  Please follow these instructions carefully.   Shower the NIGHT BEFORE SURGERY and the MORNING OF SURGERY  If you chose to wash your hair, wash your hair first as usual with your normal shampoo.  After you shampoo, rinse your hair and body thoroughly to remove the shampoo.  Use CHG Soap as you would any other liquid soap. You can apply CHG directly to the skin and wash gently with a scrungie or a clean washcloth.   Apply the CHG Soap to your body ONLY FROM THE NECK DOWN.  Do not use on open wounds  or open sores. Avoid contact with your eyes, ears, mouth and genitals (private parts). Wash Face and genitals (private parts)  with your normal soap.   Wash thoroughly, paying special attention to the area where your surgery will be performed.  Thoroughly rinse your body with warm water from the neck down.  DO NOT shower/wash with your normal soap after using and rinsing off the CHG Soap.  Pat yourself dry with a CLEAN TOWEL.  Wear CLEAN PAJAMAS to bed the night before surgery  Place CLEAN SHEETS on your bed the night before your surgery  DO NOT SLEEP WITH PETS.   Day of Surgery: Take a shower with CHG  soap. Do not wear jewelry  Do not wear lotions, powders, colognes, or deodorant. Men may shave face and neck. Do not bring valuables to the hospital. [facility] is not responsible for any belongings or valuables.  Wear Clean/Comfortable clothing the morning of surgery Remember to brush your teeth WITH YOUR REGULAR TOOTHPASTE.   Please read over the following fact sheets that you were given.    If you received a COVID test during your pre-op visit  it is requested that you wear a mask when out in public, stay away from anyone that may not be feeling well and notify your surgeon if you develop symptoms. If you have been in contact with anyone that has tested positive in the last 10 days please notify you surgeon.

## 2021-10-09 ENCOUNTER — Ambulatory Visit (HOSPITAL_COMMUNITY)
Admission: RE | Admit: 2021-10-09 | Discharge: 2021-10-09 | Disposition: A | Discharge status: Home / Self Care | Payer: Medicare Other | Source: Ambulatory Visit | Attending: Internal Medicine | Admitting: Internal Medicine

## 2021-10-09 ENCOUNTER — Encounter (HOSPITAL_COMMUNITY): Payer: Self-pay

## 2021-10-09 ENCOUNTER — Encounter (HOSPITAL_COMMUNITY)
Admission: RE | Admit: 2021-10-09 | Discharge: 2021-10-09 | Disposition: A | Discharge status: Home / Self Care | Payer: Medicare Other | Source: Ambulatory Visit | Attending: Internal Medicine | Admitting: Internal Medicine

## 2021-10-09 ENCOUNTER — Other Ambulatory Visit (HOSPITAL_COMMUNITY): Payer: Medicare Other

## 2021-10-09 ENCOUNTER — Other Ambulatory Visit: Payer: Self-pay

## 2021-10-09 VITALS — BP 132/64 | HR 64 | Temp 98.1°F | Resp 18 | Ht 67.0 in | Wt 157.8 lb

## 2021-10-09 DIAGNOSIS — Z20822 Contact with and (suspected) exposure to covid-19: Secondary | ICD-10-CM | POA: Diagnosis not present

## 2021-10-09 DIAGNOSIS — Z01818 Encounter for other preprocedural examination: Secondary | ICD-10-CM | POA: Insufficient documentation

## 2021-10-09 DIAGNOSIS — I350 Nonrheumatic aortic (valve) stenosis: Secondary | ICD-10-CM | POA: Insufficient documentation

## 2021-10-09 LAB — COMPREHENSIVE METABOLIC PANEL
ALT: 32 U/L (ref 0–44)
AST: 47 U/L — ABNORMAL HIGH (ref 15–41)
Albumin: 3.9 g/dL (ref 3.5–5.0)
Alkaline Phosphatase: 72 U/L (ref 38–126)
Anion gap: 9 (ref 5–15)
BUN: 28 mg/dL — ABNORMAL HIGH (ref 8–23)
CO2: 23 mmol/L (ref 22–32)
Calcium: 9.9 mg/dL (ref 8.9–10.3)
Chloride: 102 mmol/L (ref 98–111)
Creatinine, Ser: 1.1 mg/dL (ref 0.61–1.24)
GFR, Estimated: >60 mL/min (ref >60)
Glucose, Bld: 155 mg/dL — ABNORMAL HIGH (ref 70–99)
Potassium: 3.7 mmol/L (ref 3.5–5.1)
Sodium: 134 mmol/L — ABNORMAL LOW (ref 135–145)
Total Bilirubin: 0.3 mg/dL (ref 0.3–1.2)
Total Protein: 6.6 g/dL (ref 6.5–8.1)

## 2021-10-09 LAB — URINALYSIS, ROUTINE W REFLEX MICROSCOPIC
Bilirubin Urine: NEGATIVE
Glucose, UA: NEGATIVE mg/dL
Hgb urine dipstick: NEGATIVE
Ketones, ur: NEGATIVE mg/dL
Leukocytes,Ua: NEGATIVE
Nitrite: NEGATIVE
Protein, ur: NEGATIVE mg/dL
Specific Gravity, Urine: 1.017 (ref 1.005–1.030)
pH: 5 (ref 5.0–8.0)

## 2021-10-09 LAB — CBC
HCT: 39.3 % (ref 39.0–52.0)
Hemoglobin: 12.5 g/dL — ABNORMAL LOW (ref 13.0–17.0)
MCH: 29 pg (ref 26.0–34.0)
MCHC: 31.8 g/dL (ref 30.0–36.0)
MCV: 91.2 fL (ref 80.0–100.0)
Platelets: 304 10*3/uL (ref 150–400)
RBC: 4.31 MIL/uL (ref 4.22–5.81)
RDW: 14.5 % (ref 11.5–15.5)
WBC: 9.7 10*3/uL (ref 4.0–10.5)
nRBC: 0 % (ref 0.0–0.2)

## 2021-10-09 LAB — SURGICAL PCR SCREEN
MRSA, PCR: NEGATIVE
Staphylococcus aureus: NEGATIVE

## 2021-10-09 LAB — TYPE AND SCREEN
ABO/RH(D): O POS
Antibody Screen: NEGATIVE

## 2021-10-09 LAB — PROTIME-INR
INR: 1.2 (ref 0.8–1.2)
Prothrombin Time: 14.7 seconds (ref 11.4–15.2)

## 2021-10-09 LAB — SARS CORONAVIRUS 2 (TAT 6-24 HRS): SARS Coronavirus 2: NEGATIVE

## 2021-10-09 MED ORDER — HEPARIN 30,000 UNITS/1000 ML (OHS) CELLSAVER SOLUTION
Status: DC
  Filled 2021-10-09: qty 1000

## 2021-10-09 MED ORDER — DEXMEDETOMIDINE HCL IN NACL 400 MCG/100ML IV SOLN
0.1000 ug/kg/h | INTRAVENOUS | Status: DC
  Filled 2021-10-09: qty 100

## 2021-10-09 MED ORDER — CEFAZOLIN SODIUM-DEXTROSE 2-4 GM/100ML-% IV SOLN
2.0000 g | INTRAVENOUS | Status: AC
  Administered 2021-10-13: 2 g via INTRAVENOUS
  Filled 2021-10-09: qty 100

## 2021-10-09 MED ORDER — MAGNESIUM SULFATE 50 % IJ SOLN
40.0000 meq | INTRAMUSCULAR | Status: DC
  Filled 2021-10-09: qty 9.85

## 2021-10-09 MED ORDER — NOREPINEPHRINE 4 MG/250ML-% IV SOLN
0.0000 ug/min | INTRAVENOUS | Status: AC
  Administered 2021-10-13: 2 ug/min via INTRAVENOUS
  Filled 2021-10-09: qty 250

## 2021-10-09 MED ORDER — POTASSIUM CHLORIDE 2 MEQ/ML IV SOLN
80.0000 meq | INTRAVENOUS | Status: DC
  Filled 2021-10-09: qty 40

## 2021-10-09 NOTE — Progress Notes (Signed)
PCP - Shirline Frees Cardiologist - [place]  Chest x-ray - 10/09/21 EKG - 10/09/21 Stress Test - 07/10/18 ECHO - 07/03/21 Cardiac Cath - 07/31/21  Aspirin Instructions: follow your surgeon's instructions on when to stop taking ASA   COVID TEST- 10/09/21   Anesthesia review: yes, heart history  Patient denies shortness of breath, fever, cough and chest pain at PAT appointment   All instructions explained to the patient, with a verbal understanding of the material. Patient agrees to go over the instructions while at home for a better understanding. Patient also instructed to self quarantine after being tested for COVID-19. The opportunity to ask questions was provided.

## 2021-10-09 NOTE — Progress Notes (Signed)
Anesthesia Chart Review:  Case: [id number] Date/Time: 10/13/21 1030   Procedures:      Transcatheter Aortic Valve Replacement-Carotid (Left)     INTRAOPERATIVE TRANSESOPHAGEAL ECHOCARDIOGRAM   Anesthesia type: General   Pre-op diagnosis: Severe Aortic Stenosis   Location: [facility] OR ROOM 16 / [place] OR   Surgeons: Early Osmond, MD     CT Surgeon: Gentry Roch, MD   DISCUSSION: Patient is a 71 year old male scheduled for the above procedure.  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   See Results tab or Progress Note by Dr. Cyndia Bent regarding recent CV testing.   Anesthesia team to evaluate on the day of surgery. 10/09/21 presurgical CXR and COVID-19 test are in process.   VS: BP 132/64   Pulse 64   Temp 36.7 C (Oral)   Resp 18   Ht '5\' 7"'$  (1.702 m)   Wt 71.6 kg   SpO2 98%   BMI 24.71 kg/m    PROVIDERS: Shirline Frees, MD is PCP  Fransico Him, MD is cardiologist    LABS: Preoperative labs noted. A1c 5.6% on 08/18/21 at [place] (see CE).  (all labs ordered are listed, but only abnormal results are displayed)  Labs Reviewed  CBC - Abnormal; Notable for the following components:      Result Value   Hemoglobin 12.5 (*)    All other components within normal limits  COMPREHENSIVE METABOLIC PANEL - Abnormal; Notable for the following components:   Sodium 134 (*)    Glucose, Bld 155 (*)    BUN 28 (*)    AST 47 (*)    All other components within normal limits  SARS CORONAVIRUS 2 (TAT 6-24 HRS)  SURGICAL PCR SCREEN  PROTIME-INR  URINALYSIS, ROUTINE W REFLEX MICROSCOPIC  TYPE AND SCREEN    EKG: 10/09/2021: Normal sinus rhythm, nonspecific ST abnormality.  RHC/LHC 07/31/21:   Prox Graft-1 lesion before RPDA  is 70% stenosed.   Prox Graft-2 lesion before RPDA  is 80% stenosed.   Dist Graft lesion before RPDA   is 65% stenosed.   Mid LM lesion is 80% stenosed.   Mid LAD lesion is 100% stenosed.   Prox LAD lesion is 70% stenosed.   Dist RCA lesion is 100% stenosed.   Ost Cx lesion is 100% stenosed.   1.  Patent LIMA to LAD and vein graft to second obtuse marginal.  The sequential vein graft to the right coronary artery and R PLV branch has serial high-grade stenoses.  Given the fact the patient has no angina should be treated with medical therapy for now. 2.  Severe native multivessel disease. 3.  Normal cardiac output of 8.8 L/min and index of 4.7 L/min/m with mean right atrial pressure of 6 mmHg and mean wedge pressure of 15 mmHg. 4.  Capacious iliofemoral vessels bilaterally with femoral bifurcation on the left side at the mid femoral head.   Recommendations: Continue evaluation for aortic valve intervention.   US Carotid 02/12/14: Summary:  - The vertebral arteries appear patent with antegrade flow.  - Findings consistent with 1- 39 percent stenosis involving the    right internal carotid artery and the left internal carotid    artery.  - ICA/CCA ratio. right= 0.88. left = 0.72.   Past Medical History:  Diagnosis Date   Asthma    no current inhaler use   CAD (coronary artery  disease), native coronary artery    LIMA to LAD, SVG to OM1, SVG to RCA-RPL, EVH via right thigh and leg   Colon polyp    Environmental allergies    History of kidney stones    Hyperlipidemia    Pre-diabetes    Prostate cancer ([place])    adenocarcimona, of the prostate, 5% of single core 01/2011-watching and waiting for now   Seizure [facility])    due to lidocaine allergy with prostate biopsy this year   Severe aortic stenosis     Past Surgical History:  Procedure Laterality Date   CORONARY ARTERY BYPASS GRAFT N/A 02/13/2014   Procedure: CORONARY ARTERY BYPASS GRAFTING (CABG) TIMES FOUR USING LEFT INTERNAL MAMMARY ARTERY AND RIGHT SAPHENOUS LEG VEIN HARVESTED ENDOSCOPICALLY;  Surgeon: Rexene Alberts, MD;   Location: [place];  Service: Open Heart Surgery;  Laterality: N/A;   CYSTOSCOPY WITH LITHOLAPAXY N/A 03/17/2020   Procedure: CYSTOSCOPY WITH LITHOLAPAXY;  Surgeon: Raynelle Bring, MD;  Location: [facility];  Service: Urology;  Laterality: N/A;   cytoscopy  15 yrs ago   [place] LITHOTRIPSY  15 yrs ago   EYE SURGERY Bilateral 2015   cataracts   GOLD SEED IMPLANT N/A 03/17/2020   Procedure: GOLD SEED IMPLANT;  Surgeon: Raynelle Bring, MD;  Location: [facility];  Service: Urology;  Laterality: N/A;   INTRAOPERATIVE TRANSESOPHAGEAL ECHOCARDIOGRAM N/A 02/13/2014   Procedure: INTRAOPERATIVE TRANSESOPHAGEAL ECHOCARDIOGRAM;  Surgeon: Rexene Alberts, MD;  Location: [place];  Service: Open Heart Surgery;  Laterality: N/A;   LEFT HEART CATHETERIZATION WITH CORONARY ANGIOGRAM N/A 02/12/2014   Procedure: LEFT HEART CATHETERIZATION WITH CORONARY ANGIOGRAM;  Surgeon: Blane Ohara, MD;  Location: [facility] CATH LAB;  Service: Cardiovascular;  Laterality: N/A;   left leg surgery for fracture  2004   plates inserted   PROSTATE BIOPSY  06/2019   RIGHT/LEFT HEART CATH AND CORONARY/GRAFT ANGIOGRAPHY N/A 07/31/2021   Procedure: RIGHT/LEFT HEART CATH AND CORONARY/GRAFT ANGIOGRAPHY;  Surgeon: Early Osmond, MD;  Location: [place] CV LAB;  Service: Cardiovascular;  Laterality: N/A;   SPACE OAR INSTILLATION N/A 03/17/2020   Procedure: SPACE OAR INSTILLATION;  Surgeon: Raynelle Bring, MD;  Location: [facility];  Service: Urology;  Laterality: N/A;    MEDICATIONS:  albuterol (VENTOLIN HFA) 108 (90 Base) MCG/ACT inhaler   aspirin 81 MG chewable tablet   atorvastatin (LIPITOR) 80 MG tablet   clotrimazole-betamethasone (LOTRISONE) cream   fenofibrate (TRICOR) 338 MG tablet   Garlic (GARLIQUE PO)   icosapent Ethyl (VASCEPA) 1 g capsule   levocetirizine (XYZAL) 5 MG tablet   metoprolol tartrate (LOPRESSOR) 50 MG tablet   Multiple Vitamin (MULTIVITAMIN WITH  MINERALS) TABS tablet   NEXLIZET 180-10 MG TABS   nitroGLYCERIN (NITROSTAT) 0.4 MG SL tablet   No current facility-administered medications for this encounter.    [START ON 10/13/2021] ceFAZolin (ANCEF) IVPB 2g/100 mL premix   [START ON 10/13/2021] dexmedetomidine (PRECEDEX) 400 MCG/100ML (4 mcg/mL) infusion   [START ON 10/13/2021] heparin 30,000 units/NS 1000 mL solution for CELLSAVER   [START ON 10/13/2021] magnesium sulfate (IV Push/IM) injection 40 mEq   [START ON 10/13/2021] norepinephrine (LEVOPHED) '4mg'$  in 268m (0.016 mg/mL) premix infusion   [START ON 10/13/2021] potassium chloride injection 80 mEq   sodium phosphate (FLEET) 7-19 GM/118ML enema 1 enema    AMyra Gianotti PA-C Surgical Short Stay/Anesthesiology MWindom Area HospitalPhone ([phone]. Keelen SapuLPaPhone ([phone]/26/2023 2:52 PM

## 2021-10-09 NOTE — Anesthesia Preprocedure Evaluation (Addendum)
Anesthesia Evaluation Anesthesia Physical Anesthesia Plan  ASA:   Anesthesia Plan:    Post-op Pain Management:    Induction:   PONV Risk Score and Plan:   Airway Management Planned:   Additional Equipment:   Intra-op Plan:   Post-operative Plan:   Informed Consent:   Plan Discussed with:   Anesthesia Plan Comments: (PAT note written 10/09/2021 by Myra Gianotti, PA-C. )        Anesthesia Quick Evaluation

## 2021-10-12 NOTE — H&P (Signed)
[address]       [place] [address]             [phone]      PCP is Shirline Frees, MD  Referring Provider is Lenna Sciara, MD  Primary Cardiologist is Fransico Him, MD   Reason for consultation: Severe aortic stenosis   HPI:  The patient is a 71 year old gentleman with a history of hyperlipidemia, pre-diabetes, prostate cancer, coronary artery disease status post CABG x4 by Dr. Roxy Manns on 02/13/2014, and severe aortic stenosis that has been followed by Dr. Radford Pax. He had an echocardiogram in February 2022 showing a mean gradient of 27 mmHg across aortic valve with a valve area of 1.28 cm consistent with moderate aortic stenosis. Left ventricular ejection fraction was 65 to 70%. He has now developed exertional shortness of breath and fatigue with moderate activity such as walking up stairs carrying something or walking up an incline. He has had no symptoms at rest. He denies any chest discomfort. He has had no dizziness or syncope. He denies peripheral edema. His most recent echocardiogram on 07/03/2021 showed an increase in the mean aortic valve gradient to 41 mmHg with a valve area of 0.67 cm by VTI consistent with severe aortic stenosis. There is mild aortic insufficiency. Left ventricular ejection fraction is 65 to 70% with grade 1 diastolic dysfunction.  He lives with his wife. 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 He does not exercise.      Past Medical History:  Diagnosis Date   Asthma    no current inhaler use   CAD (coronary artery disease), native coronary artery    LIMA to LAD, SVG to OM1, SVG to RCA-RPL, EVH via right thigh and leg   Colon polyp    Environmental allergies    History of kidney stones    Hyperlipidemia    Pre-diabetes    Prostate cancer ([place])    adenocarcimona, of the prostate, 5% of single core 01/2011-watching and waiting for now   Seizure [facility])    due to lidocaine allergy with prostate biopsy this year   Severe aortic  stenosis         Past Surgical History:  Procedure Laterality Date   CORONARY ARTERY BYPASS GRAFT N/A 02/13/2014   Procedure: CORONARY ARTERY BYPASS GRAFTING (CABG) TIMES FOUR USING LEFT INTERNAL MAMMARY ARTERY AND RIGHT SAPHENOUS LEG VEIN HARVESTED ENDOSCOPICALLY; Surgeon: Rexene Alberts, MD; Location: [place]; Service: Open Heart Surgery; Laterality: N/A;   CYSTOSCOPY WITH LITHOLAPAXY N/A 03/17/2020   Procedure: CYSTOSCOPY WITH LITHOLAPAXY; Surgeon: Raynelle Bring, MD; Location: [facility]; Service: Urology; Laterality: N/A;   cytoscopy  15 yrs ago   [place] LITHOTRIPSY  15 yrs ago   EYE SURGERY Bilateral 2015   cataracts   GOLD SEED IMPLANT N/A 03/17/2020   Procedure: GOLD SEED IMPLANT; Surgeon: Raynelle Bring, MD; Location: [facility]; Service: Urology; Laterality: N/A;   INTRAOPERATIVE TRANSESOPHAGEAL ECHOCARDIOGRAM N/A 02/13/2014   Procedure: INTRAOPERATIVE TRANSESOPHAGEAL ECHOCARDIOGRAM; Surgeon: Rexene Alberts, MD; Location: [place]; Service: Open Heart Surgery; Laterality: N/A;   LEFT HEART CATHETERIZATION WITH CORONARY ANGIOGRAM N/A 02/12/2014   Procedure: LEFT HEART CATHETERIZATION WITH CORONARY ANGIOGRAM; Surgeon: Blane Ohara, MD; Location: [facility] CATH LAB; Service: Cardiovascular; Laterality: N/A;   left leg surgery for fracture  2004   plates inserted   PROSTATE BIOPSY  06/2019   RIGHT/LEFT HEART CATH AND CORONARY/GRAFT ANGIOGRAPHY N/A 07/31/2021  Procedure: RIGHT/LEFT HEART CATH AND CORONARY/GRAFT ANGIOGRAPHY; Surgeon: Early Osmond, MD; Location: [place] CV LAB; Service: Cardiovascular; Laterality: N/A;   SPACE OAR INSTILLATION N/A 03/17/2020   Procedure: SPACE OAR INSTILLATION; Surgeon: Raynelle Bring, MD; Location: [facility]; Service: Urology; Laterality: N/A;        Family History  Problem Relation Age of Onset   Alzheimer's disease Mother    Stroke Mother    Skin cancer Mother    Cancer - Lung  Father    Cancer Father    asbestos   Heart attack Maternal Grandfather    Breast cancer Neg Hx    Colon cancer Neg Hx    Prostate cancer Neg Hx    Pancreatic cancer Neg Hx    Social History        Socioeconomic History   Marital status: Single    Spouse name: Not on file   Number of children: Not on file   Years of education: Not on file   Highest education level: Not on file  Occupational History    Comment: full time  Tobacco Use   Smoking status: Never   Smokeless tobacco: Never  Vaping Use   Vaping Use: Never used  Substance and Sexual Activity   Alcohol use: No   Drug use: No   Sexual activity: Yes  Other Topics Concern   Not on file  Social History Narrative   Not on file   Social Determinants of Health   Financial Resource Strain: Not on file  Food Insecurity: Not on file  Transportation Needs: Not on file  Physical Activity: Not on file  Stress: Not on file  Social Connections: Not on file  Intimate Partner Violence: Not on file          Prior to Admission medications   Medication Sig Start Date End Date Taking? Authorizing Provider  albuterol (VENTOLIN HFA) 108 (90 Base) MCG/ACT inhaler Inhale 1-2 puffs into the lungs daily as needed for wheezing or shortness of breath.    [provider]  amoxicillin (AMOXIL) 500 MG capsule Take 500 mg by mouth 3 (three) times daily.  Patient not taking: Reported on 10/07/2021 08/14/21   [provider]  aspirin 81 MG chewable tablet Chew 81 mg by mouth daily.    [provider]  atorvastatin (LIPITOR) 80 MG tablet TAKE 1 TABLET BY MOUTH EVERY DAY 05/13/21   Sueanne Margarita, MD  fenofibrate (TRICOR) 145 MG tablet TAKE 1 TABLET BY MOUTH EVERY DAY 06/22/21   Lenna Sciara, NP  Garlic (GARLIQUE PO) Take 1 capsule by mouth daily.    [provider]  icosapent Ethyl (VASCEPA) 1 g capsule Take 2 capsules (2 g total) by mouth 2 (two) times daily. 06/12/21   Sueanne Margarita, MD  levocetirizine  (XYZAL) 5 MG tablet Take 5 mg by mouth at bedtime as needed. 06/12/19   [provider]  metoprolol tartrate (LOPRESSOR) 50 MG tablet Take 50 mg by mouth 2 (two) times daily. 05/20/17   [provider]  NEXLIZET 180-10 MG TABS TAKE 1 TABLET BY MOUTH DAILY. PLEASE KEEP UPCOMING WITH DR. Radford Pax ON 08/19/21 08/19/21   Sueanne Margarita, MD  nitroGLYCERIN (NITROSTAT) 0.4 MG SL tablet Place 1 tablet (0.4 mg total) under the tongue every 5 (five) minutes as needed for chest pain. 01/24/15   Sueanne Margarita, MD         Current Outpatient Medications  Medication Sig Dispense Refill  albuterol (VENTOLIN HFA) 108 (90 Base) MCG/ACT inhaler Inhale 1-2 puffs into the lungs daily as needed for wheezing or shortness of breath.     amoxicillin (AMOXIL) 500 MG capsule Take 500 mg by mouth 3 (three) times daily. (Patient not taking: Reported on 10/07/2021)     aspirin 81 MG chewable tablet Chew 81 mg by mouth daily.     atorvastatin (LIPITOR) 80 MG tablet TAKE 1 TABLET BY MOUTH EVERY DAY 30 tablet 11   fenofibrate (TRICOR) 145 MG tablet TAKE 1 TABLET BY MOUTH EVERY DAY 90 tablet 3   Garlic (GARLIQUE PO) Take 1 capsule by mouth daily.     icosapent Ethyl (VASCEPA) 1 g capsule Take 2 capsules (2 g total) by mouth 2 (two) times daily. 120 capsule 11   levocetirizine (XYZAL) 5 MG tablet Take 5 mg by mouth at bedtime as needed.     metoprolol tartrate (LOPRESSOR) 50 MG tablet Take 50 mg by mouth 2 (two) times daily.     NEXLIZET 180-10 MG TABS TAKE 1 TABLET BY MOUTH DAILY. PLEASE KEEP UPCOMING WITH DR. Radford Pax ON 08/19/21 30 tablet 2   nitroGLYCERIN (NITROSTAT) 0.4 MG SL tablet Place 1 tablet (0.4 mg total) under the tongue every 5 (five) minutes as needed for chest pain. 25 tablet 3   No current facility-administered medications for this visit.            Facility-Administered Medications Ordered in Other Visits  Medication Dose Route Frequency Provider Last Rate Last Admin   sodium phosphate (FLEET) 7-19  GM/118ML enema 1 enema 1 enema Rectal Once Raynelle Bring, MD          Allergies  Allergen Reactions   Lidocaine     Seizure with biopsy with lidocaine this year   Promethazine Hcl     Other reaction(s): stomach upset   Review of Systems:  General: normal appetite, + decreased energy, no weight gain, no weight loss, no fever  Cardiac: no chest pain with exertion, no chest pain at rest, +SOB with moderate exertion, no resting SOB, no PND, no orthopnea, no palpitations, no arrhythmia, no atrial fibrillation, no LE edema, no dizzy spells, no syncope  Respiratory: + exertional shortness of breath, no home oxygen, no productive cough, no dry cough, no bronchitis, no wheezing, no hemoptysis, no asthma, no pain with inspiration or cough, no sleep apnea, no CPAP at night  GI: no difficulty swallowing, no reflux, no frequent heartburn, no hiatal hernia, no abdominal pain, no constipation, no diarrhea, no hematochezia, no hematemesis, no melena  GU: no dysuria, no frequency, no urinary tract infection, no hematuria, no enlarged prostate, + hx of kidney stones, no kidney disease  Vascular: no pain suggestive of claudication, no pain in feet, no leg cramps, no varicose veins, no DVT, no non-healing foot ulcer  Neuro: no stroke, no TIA's, no seizures, no headaches, no temporary blindness one eye, no slurred speech, no peripheral neuropathy, no chronic pain, no instability of gait, no memory/cognitive dysfunction  Musculoskeletal: no arthritis, no joint swelling, no myalgias, no difficulty walking, normal mobility  Skin: no rash, no itching, no skin infections, no pressure sores or ulcerations  Psych: no anxiety, no depression, no nervousness, no unusual recent stress  Eyes: no blurry vision, no floaters, no recent vision changes, + wears glasses ENT: no hearing loss, no loose or painful teeth, no dentures, last saw dentist 08/2021  Hematologic: no easy bruising, no abnormal bleeding, no clotting disorder, no  frequent epistaxis  Endocrine:  no diabetes, does not check CBG's at home  Physical Exam:  BP 137/79  Pulse (!) 56  Resp 20  Ht '5\' 7"'$  (1.702 m)  Wt 160 lb (72.6 kg)  SpO2 97% Comment: RA  BMI 25.06 kg/m  General: well-appearing  HEENT: Unremarkable, NCAT, PERLA, EOMI  Neck: no JVD, no bruits, no adenopathy  Chest: clear to auscultation, symmetrical breath sounds, no wheezes, no rhonchi  CV: RRR, 3/6 sytolic murmur RSB  Abdomen: soft, non-tender, no masses  Extremities: warm, well-perfused, pulses palpable at ankle, no lower extremity edema  Rectal/GU Deferred  Neuro: Grossly non-focal and symmetrical throughout  Skin: Clean and dry, no rashes, no breakdown  Diagnostic Tests:  ECHOCARDIOGRAM REPORT     Patient Name: Melvin Dunn Date of Exam: 07/03/2021  Medical Rec #: [id number] Height: 66.0 in  Accession #: [id number] Weight: 165.7 lb  Date of Birth: 1951-05-03 BSA: 1.846 m  Patient Age: 65 years BP: 142/66 mmHg  Patient Gender: M HR: 65 bpm.  Exam Location: Outpatient   Procedure: 2D Echo, Cardiac Doppler, Color Doppler and Strain Analysis   MODIFIED REPORT:  This report was modified by Lyman Bishop MD on 07/03/2021 due to Left out  IVC  measurement.  Indications: R06.9 DOE; I35.2 Nonrheumatic aortic (valve) stenosis  with  insufficiency   History: Patient has prior history of Echocardiogram examinations,  most  recent 07/01/2020. CAD, Prior CABG, Aortic Valve Disease,  Signs/Symptoms:Dyspnea; Risk Factors:Diabetes,  Dyslipidemia and  Non-Smoker. Patient denies chest pain and leg edema. he  does  have DOE intermittently.   Sonographer: Salvadore Dom RVT, RDCS (AE), RDMS  Referring Phys: [place]  Diagnosing Phys: Lyman Bishop MD   IMPRESSIONS    1. Left ventricular ejection fraction, by estimation, is 65 to 70%. Left  ventricular ejection fraction by 2D MOD biplane is 66.9 %. The left  ventricle has normal function. The left ventricle has no  regional wall  motion abnormalities. Left ventricular  diastolic parameters are consistent with Grade I diastolic dysfunction  (impaired relaxation). Elevated left ventricular end-diastolic pressure.  2. Low normal RV free wall strain (-24.6%). Right ventricular systolic  function is low normal. The right ventricular size is normal. There is  normal pulmonary artery systolic pressure. The estimated right ventricular  systolic pressure is 42.5 mmHg.  3. The mitral valve is abnormal. Trivial mitral valve regurgitation.  4. The aortic valve is tricuspid. There is moderate calcification of the  aortic valve. There is moderate thickening of the aortic valve. Aortic  valve regurgitation is mild. Severe aortic valve stenosis. Aortic  regurgitation PHT measures 431 msec. Aortic  valve area, by VTI measures 0.67 cm. Aortic valve mean gradient measures  41.0 mmHg. Aortic valve Vmax measures 4.63 m/s. DI of 0.24.  5. The inferior vena cava is normal in size with greater than 50%  respiratory variability, suggesting right atrial pressure of 3 mmHg.   Comparison(s): Changes from prior study are noted. 07/01/2020: LVEF 65%,  moderate AS mean 26.7 mmHg, peak 52.8 mmHg.   Conclusion(s)/Recommendation(s): Would recommend symptomatic correlation  and evaluation for aortic valve replacement.   FINDINGS  Left Ventricle: Left ventricular ejection fraction, by estimation, is 65  to 70%. Left ventricular ejection fraction by 2D MOD biplane is 66.9 %.  The left ventricle has normal function. The left ventricle has no regional  wall motion abnormalities. The  left ventricular internal cavity size was normal in size. There is no left  ventricular hypertrophy. Left ventricular  diastolic parameters are  consistent with Grade I diastolic dysfunction (impaired relaxation).  Elevated left ventricular end-diastolic  pressure.   Right Ventricle: Low normal RV free wall strain (-24.6%). The right  ventricular size  is normal. No increase in right ventricular wall  thickness. Right ventricular systolic function is low normal. There is  normal pulmonary artery systolic pressure. The  tricuspid regurgitant velocity is 2.73 m/s, and with an assumed right  atrial pressure of 3 mmHg, the estimated right ventricular systolic  pressure is 26.7 mmHg.   Left Atrium: Left atrial size was normal in size.   Right Atrium: Right atrial size was normal in size.   Pericardium: There is no evidence of pericardial effusion.   Mitral Valve: The mitral valve is abnormal. There is mild calcification of  the mitral valve leaflet(s). Trivial mitral valve regurgitation.   Tricuspid Valve: The tricuspid valve is grossly normal. Tricuspid valve  regurgitation is trivial.   Aortic Valve: The aortic valve is tricuspid. There is moderate  calcification of the aortic valve. There is moderate thickening of the  aortic valve. Aortic valve regurgitation is mild. Aortic regurgitation PHT  measures 431 msec. Severe aortic stenosis is  present. Aortic valve mean gradient measures 41.0 mmHg. Aortic valve peak  gradient measures 85.7 mmHg. Aortic valve area, by VTI measures 0.67 cm.   Pulmonic Valve: The pulmonic valve was grossly normal. Pulmonic valve  regurgitation is trivial.   Aorta: The aortic root and ascending aorta are structurally normal, with  no evidence of dilitation.   Venous: The inferior vena cava is normal in size with greater than 50%  respiratory variability, suggesting right atrial pressure of 3 mmHg.   IAS/Shunts: No atrial level shunt detected by color flow Doppler.    LEFT VENTRICLE  PLAX 2D Biplane EF (MOD)  LVIDd: 4.80 cm LV Biplane EF: Left  LVIDs: 3.11 cm ventricular  LV PW: 1.09 cm ejection  LV IVS: 0.79 cm fraction by  LVOT diam: 1.90 cm 2D MOD  LV SV: 67 biplane is  LV SV Index: 36 66.9 %.  LVOT Area: 2.84 cm  Diastology  LV e' medial: 5.63 cm/s  LV Volumes (MOD) LV E/e' medial: 17.7   LV vol d, MOD 65.1 ml LV e' lateral: 8.63 cm/s  A2C: LV E/e' lateral: 11.6  LV vol d, MOD 60.1 ml  A4C:  LV vol s, MOD 21.2 ml  A2C:  LV vol s, MOD 19.5 ml 3D Volume EF:  A4C: 3D EF: 59 %  LV SV MOD A2C: 43.9 ml LV EDV: 133 ml  LV SV MOD A4C: 60.1 ml LV ESV: 55 ml  LV SV MOD BP: 42.5 ml LV SV: 78 ml   RIGHT VENTRICLE  RV S prime: 10.20 cm/s  TAPSE (M-mode): 1.6 cm   LEFT ATRIUM Index RIGHT ATRIUM Index  LA diam: 4.00 cm 2.17 cm/m RA Area: 14.60 cm  LA Vol (A2C): 70.4 ml 38.13 ml/m RA Volume: 36.50 ml 19.77 ml/m  LA Vol (A4C): 50.4 ml 27.30 ml/m  LA Biplane Vol: 61.2 ml 33.15 ml/m  AORTIC VALVE PULMONIC VALVE  AV Area (Vmax): 0.68 cm PV Vmax: 1.38 m/s  AV Area (Vmean): 0.71 cm PV Peak grad: 7.7 mmHg  AV Area (VTI): 0.67 cm PR End Diast Vel: 2.78 msec  AV Vmax: 463.00 cm/s  AV Vmean: 284.000 cm/s  AV VTI: 0.999 m  AV Peak Grad: 85.7 mmHg  AV Mean Grad: 41.0 mmHg  LVOT Vmax: 111.00 cm/s  LVOT  Vmean: 71.200 cm/s  LVOT VTI: 0.237 m  LVOT/AV VTI ratio: 0.24  AI PHT: 431 msec  AR Vena Contracta: 0.30 cm   AORTA  Ao Root diam: 3.40 cm  Ao Asc diam: 3.60 cm  Ao Arch diam: 3.9 cm   MITRAL VALVE TRICUSPID VALVE  MV Area (PHT): 3.85 cm TR Peak grad: 29.8 mmHg  MV Decel Time: 197 msec TR Vmax: 273.00 cm/s  MV E velocity: 99.80 cm/s  MV A velocity: 86.60 cm/s SHUNTS  MV E/A ratio: 1.15 Systemic VTI: 0.24 m  Systemic Diam: 1.90 cm   Lyman Bishop MD  Electronically signed by Lyman Bishop MD  Signature Date/Time: 07/03/2021/3:25:00 PM     Final (Updated)  Physicians  Panel Physicians Referring Physician Case Authorizing Physician  Early Osmond, MD (Primary)    Procedures  RIGHT/LEFT HEART CATH AND CORONARY/GRAFT ANGIOGRAPHY  Conclusion   Prox Graft-1 lesion before RPDA is 70% stenosed.   Prox Graft-2 lesion before RPDA is 80% stenosed.   Dist Graft lesion before RPDA is 65% stenosed.   Mid LM lesion is 80% stenosed.   Mid LAD lesion is 100% stenosed.    Prox LAD lesion is 70% stenosed.   Dist RCA lesion is 100% stenosed.   Ost Cx lesion is 100% stenosed.  1. Patent LIMA to LAD and vein graft to second obtuse marginal. The sequential vein graft to the right coronary artery and R PLV branch has serial high-grade stenoses. Given the fact the patient has no angina should be treated with medical therapy for now. 2. Severe native multivessel disease. 3. Normal cardiac output of 8.8 L/min and index of 4.7 L/min/m with mean right atrial pressure of 6 mmHg and mean wedge pressure of 15 mmHg. 4. Capacious iliofemoral vessels bilaterally with femoral bifurcation on the left side at the mid femoral head.  Recommendations: Continue evaluation for aortic valve intervention.  Indications  Nonrheumatic aortic valve stenosis [I35.0 (ICD-10-CM)]  Clinical Presentation  CHF/Shock Congestive heart failure not present. No shock present.  Procedural Details  Technical Details The patient is a 71 year old male with a history of coronary artery disease status post CABG consisting of a LIMA to LAD, vein graft to obtuse marginal 2, and vein graft to right coronary artery sequential to R PLV branch, hypertension, hyperlipidemia, and severe aortic stenosis was evaluated in an outpatient setting due to ongoing dyspnea. He is referred for preprocedural right heart catheterization and coronary angiography and bypass angiography assessment.  After obtaining consent the patient was brought to the cardiac catheterization laboratory prepped draped sterile fashion ultrasound was used to gain access to the left radial artery and a 6 French glide sheath placed there. '5mg'$  of verapamil and 5000 units of heparin were administered through the sheath. A previously placed antecubital IV was exchanged for a 5 French femoral glide sheath. Right heart catheterization, coronary and bypass angiography study was then performed. Peripheral angiography was also performed. At the conclusion of the  procedure manual pressure was applied to the antecubital site and a TR band was placed on the radial site.  Estimated blood loss <50 mL.   During this procedure medications were administered to achieve and maintain moderate conscious sedation while the patient's heart rate, blood pressure, and oxygen saturation were continuously monitored and I was present face-to-face 100% of this time.  Medications  (Filter: Administrations occurring from 0727 to 0850 on 07/31/21)  Heparin (Porcine) in NaCl 1000-0.9 UT/500ML-% SOLN (mL)  Total volume: 1,000 mL  Date/Time Rate/Dose/Volume  Action   07/31/21 0733 500 mL Given   0733 500 mL Given   midazolam (VERSED) injection (mg)  Total dose: 1 mg  Date/Time Rate/Dose/Volume Action   07/31/21 0747 1 mg Given   fentaNYL (SUBLIMAZE) injection (mcg)  Total dose: 25 mcg  Date/Time Rate/Dose/Volume Action   07/31/21 0748 25 mcg Given   tetracaine 1 % injection (mg)  Total dose: 2 mg  Date/Time Rate/Dose/Volume Action   07/31/21 0751 1 mg Given   0752 1 mg Given   Radial Cocktail/Verapamil only (mL)  Total volume: 10 mL  Date/Time Rate/Dose/Volume Action   07/31/21 0753 10 mL Given   heparin sodium (porcine) injection (Units)  Total dose: 5,000 Units  Date/Time Rate/Dose/Volume Action   07/31/21 0756 5,000 Units Given   iohexol (OMNIPAQUE) 350 MG/ML injection (mL)  Total volume: 130 mL  Date/Time Rate/Dose/Volume Action   07/31/21 0840 130 mL Given   Sedation Time  Sedation Time Physician-1: 49 minutes 20 seconds  Contrast  Medication Name Total Dose  iohexol (OMNIPAQUE) 350 MG/ML injection 130 mL  Radiation/Fluoro  Fluoro time: 11.4 (min)  DAP: 14.4 (Gycm2)  Cumulative Air Kerma: 315.4 (mGy)  Complications     Complications documented before study signed (07/31/2021 0:08 AM)    No complications were associated with this study.   Documented by Evern Bio, RN - 07/31/2021 8:42 AM   Coronary Findings  Diagnostic  Dominance: Right   Left Main  Mid LM lesion is 80% stenosed.  Left Anterior Descending  Prox LAD lesion is 70% stenosed.  Mid LAD lesion is 100% stenosed.  Left Circumflex  Ost Cx lesion is 100% stenosed.  Right Coronary Artery  Vessel is small. There is moderate diffuse disease throughout the vessel.  Dist RCA lesion is 100% stenosed.  Sequential Graft To RPDA, 2nd RPL  Prox Graft-1 lesion before RPDA is 70% stenosed.  Prox Graft-2 lesion before RPDA is 80% stenosed.  Dist Graft lesion before RPDA is 65% stenosed.  Graft To 2nd Mrg  LIMA Graft To Dist LAD  Intervention   No interventions have been documented.  Coronary Diagrams  Diagnostic  Dominance: Right  Intervention  Implants  No implant documentation for this case.   Syngo Images  Show images for CARDIAC CATHETERIZATION  Images on Long Term Storage  Show images for [place], Jovel to Procedure Log    Procedure Log  Hemo Data  Flowsheet Row Most Recent Value  Fick Cardiac Output 8.84 L/min  Fick Cardiac Output Index 4.79 (L/min)/BSA  RA A Wave 10 mmHg  RA V Wave 11 mmHg  RA Mean 6 mmHg  RV Systolic Pressure 36 mmHg  RV Diastolic Pressure 0 mmHg  RV EDP 11 mmHg  PA Systolic Pressure 31 mmHg  PA Diastolic Pressure 10 mmHg  PA Mean 22 mmHg  PW A Wave 17 mmHg  PW V Wave 21 mmHg  PW Mean 15 mmHg  AO Systolic Pressure 676 mmHg  AO Diastolic Pressure 59 mmHg  AO Mean 88 mmHg  QP/QS 0.74  TPVR Index 6.21 HRUI  TSVR Index 18.36 HRUI  PVR SVR Ratio 0.12  TPVR/TSVR Ratio 0.34   ADDENDUM REPORT: 08/05/2021 05:02  ADDENDUM:  Extracardiac findings will be described separately under dictation  for contemporaneously obtained CTA chest, abdomen and pelvis dated  08/04/2021. Please see that report for full description of relevant  extracardiac findings.  Electronically Signed  By: Vinnie Langton M.D.  On: 08/05/2021 05:02   Addended by Vinnie Langton  Viona Gilmore, MD on 08/05/2021 5:04 AM  Study Result  Narrative & Impression  CLINICAL  DATA: Aortic Stenosis  EXAM:  Cardiac TAVR CT  TECHNIQUE:  The patient was scanned on a Siemens Force 315 slice scanner. A 120  kV retrospective scan was triggered in the ascending thoracic aorta  at 140 HU's. Gantry rotation speed was 250 msecs and collimation was  .6 mm. No beta blockade or nitro were given. The 3D data set was  reconstructed in 5% intervals of the R-R cycle. Systolic and  diastolic phases were analyzed on a dedicated work station using  MPR, MIP and VRT modes. The patient received 80 cc of contrast.  FINDINGS:  Aortic Valve: Tri leaflet AV with calcium score 1445 somewhat low  Planimeter AVA 1.6 cm 2  Aorta: No aneurysm Significant protruding soft plaque in descending  thoracic aorta  Sino-tubular Junction: 25 mm  Ascending Thoracic Aorta: 32 mm  Aortic Arch: 27 mm  Descending Thoracic Aorta: 27 mm  Sinus of Valsalva Measurements:  Non-coronary: 30.6 mm  Right - coronary: 28.2 mm Height 16.6 mm  Left - coronary: 30.7 mm Height 16.4 mm  Coronary Artery Height above Annulus:  Left Main: 11.2 mm above annulus  Right Coronary: 13.4 mm above annulus  Virtual Basal Annulus Measurements:  Maximum / Minimum Diameter: 26.6 mm x 20.4 mm  Perimeter: 73.3 mm  Area: 421.6 mm2  Coronary Arteries: Sufficient height above annulus for deployment  Patent SVG to RCA, Patent SVG to OM, Patent LIMA to LAD  Optimum Fluoroscopic Angle for Delivery: LAO 11 Caudal 11 degrees  IMPRESSION:  1. Tri leaflet AV with score 1445 and AVA by planimetry 1.6 cm2  2. Optimum angiographic angle for deployment LAO 11 Caudal 11  degrees  3. Coronary arteries sufficient height above annulus for deployment  Patent SVG's to RCA/OM Patent LIMA to LAD  4. Annular area of 421.6 mm2 suitable for a 23 mm Sapien 3 valve  Right sinus diameter a bit small for 29 mm Medtronic Evolut Valve  5. Protruding soft plaque in descending thoracic aorta may be a  source of embolus with device advancement  Jenkins Rouge  Electronically Signed:  By: Jenkins Rouge M.D.  On: 08/04/2021 11:52    Narrative & Impression  CLINICAL DATA: 72 year old male with history of severe aortic  stenosis. Preprocedural study prior to potential transcatheter  aortic valve replacement (TAVR) procedure.  EXAM:  CT ANGIOGRAPHY CHEST, ABDOMEN AND PELVIS  TECHNIQUE:  Multidetector CT imaging through the chest, abdomen and pelvis was  performed using the standard protocol during bolus administration of  intravenous contrast. Multiplanar reconstructed images and MIPs were  obtained and reviewed to evaluate the vascular anatomy.  RADIATION DOSE REDUCTION: This exam was performed according to the  departmental dose-optimization program which includes automated  exposure control, adjustment of the mA and/or kV according to  patient size and/or use of iterative reconstruction technique.  CONTRAST: 15m OMNIPAQUE IOHEXOL 350 MG/ML SOLN  COMPARISON: No priors.  FINDINGS:  CTA CHEST FINDINGS  Cardiovascular: Heart size is borderline enlarged. There is no  significant pericardial fluid, thickening or pericardial  calcification. There is aortic atherosclerosis, as well as  atherosclerosis of the great vessels of the mediastinum and the  coronary arteries, including calcified atherosclerotic plaque in the  left main, left anterior descending, left circumflex and right  coronary arteries. Status post median sternotomy for CABG including  LIMA to the LAD. Severe thickening and calcification of the aortic  valve.  Mediastinum/Lymph Nodes: No pathologically enlarged mediastinal or  hilar lymph nodes. Esophagus is unremarkable in appearance. No  axillary lymphadenopathy.  Lungs/Pleura: No acute consolidative airspace disease. No pleural  effusions. No suspicious appearing pulmonary nodules or masses are  noted.  Musculoskeletal/Soft Tissues: Median sternotomy wires. There are no  aggressive appearing lytic or blastic lesions  noted in the  visualized portions of the skeleton.  CTA ABDOMEN AND PELVIS FINDINGS  Hepatobiliary: Multiple small low-attenuation lesions are again  noted in the liver, largest of which are compatible with simple  cysts, measuring up to 2.2 cm in the inferior aspect of segment 3 of  the liver (axial image 123 of series 15). Smaller subcentimeter  low-attenuation lesions are also stable compared to the prior study  and too small to characterize, but statistically likely to represent  tiny cysts. No suspicious hepatic lesions. No intra or extrahepatic  biliary ductal dilatation. Gallbladder is normal in appearance.  Pancreas: No pancreatic mass. No pancreatic ductal dilatation. No  pancreatic or peripancreatic fluid collections or inflammatory  changes.  Spleen: Unremarkable.  Adrenals/Urinary Tract: Multiple nonobstructive calculi are noted  within the right renal collecting system, measuring up to 6 mm in  the upper pole. Multiple low-attenuation lesions in the left kidney  are compatible with simple cysts, largest of which is located in the  posterior aspect of the interpolar region measuring 3.9 cm in  diameter. Other subcentimeter low-attenuation lesions in the left  kidney are too small to definitively characterize, but also favored  to represent small cysts. No hydroureteronephrosis. Urinary bladder  is normal in appearance.  Stomach/Bowel: The appearance of the stomach is normal. There is no  pathologic dilatation of small bowel or colon. Numerous colonic  diverticulae are noted, particularly in the descending colon and  sigmoid colon, without surrounding inflammatory changes to suggest  an acute diverticulitis at this time. Normal appendix.  Vascular/Lymphatic: Aortic atherosclerosis, with vascular findings  and measurements pertinent to potential TAVR procedure, as detailed  below. No aneurysm or dissection noted in the abdominal or pelvic  vasculature. No lymphadenopathy  noted in the abdomen or pelvis.  Reproductive: Fiducial markers in the prostate gland. Seminal  vesicles are unremarkable in appearance.  Other: No significant volume of ascites. No pneumoperitoneum.  Musculoskeletal: There are no aggressive appearing lytic or blastic  lesions noted in the visualized portions of the skeleton.  VASCULAR MEASUREMENTS PERTINENT TO TAVR:  AORTA:  Minimal Aortic Diameter-9 x 9 mm  Severity of Aortic Calcification-severe  RIGHT PELVIS:  Right Common Iliac Artery -  Minimal Diameter-7.6 x 7.1 mm  Tortuosity-mild  Calcification-mild  Right External Iliac Artery -  Minimal Diameter-6.0 x 5.4 mm  Tortuosity-mild-to-moderate  Calcification-none  Right Common Femoral Artery -  Minimal Diameter-6.0 x 5.5 mm  Tortuosity-mild  Calcification-mild  LEFT PELVIS:  Left Common Iliac Artery -  Minimal Diameter-8.1 x 6.5 mm  Tortuosity-mild  Calcification-mild  Left External Iliac Artery -  Minimal Diameter-6.0 x 5.7 mm  Tortuosity-mild-to-moderate  Calcification-none  Left Common Femoral Artery -  Minimal Diameter-5.6 x 6.2 mm  Tortuosity-mild  Calcification-mild  Review of the MIP images confirms the above findings.  IMPRESSION:  1. Vascular findings and measurements pertinent to potential TAVR  procedure, as detailed above.  2. Severe thickening and calcification of the aortic valve,  compatible with reported clinical history of severe aortic stenosis.  3. Aortic atherosclerosis, in addition to left main and three-vessel  coronary artery disease. Status post median sternotomy for CABG  including  LIMA to the LAD.  4. Colonic diverticulosis without evidence of acute diverticulitis  at this time.  5. Additional incidental findings, as above.  Electronically Signed  By: Vinnie Langton M.D.  On: 08/05/2021 05:34     Impression:   This 71 year old gentleman has stage D, severe, symptomatic aortic stenosis with New York Heart Association class II  symptoms of exertional fatigue and shortness of breath consistent with chronic diastolic congestive heart failure. I have personally reviewed his 2D echo, cardiac catheterization, and CTA studies. His echocardiogram shows a calcified and thickened aortic valve with restricted mobility. The mean gradient is 41 mmHg with a valve area of 0.67 cm and a dimensionless index of 0.24. There is mild aortic insufficiency. Left ventricular ejection fraction is normal. Cardiac catheterization shows a patent left internal mammary graft to the LAD and a patent saphenous vein graft to the second obtuse marginal. The sequential vein graft to the distal right coronary artery and right posterolateral branch has serial high-grade stenoses but since he is not having any chest discomfort it is felt that this could be treated medically for now. I agree that aortic valve replacement is indicated in this patient for relief of his symptoms and to prevent progressive left ventricular deterioration. Given his age and prior coronary bypass surgery I think that transcatheter aortic valve replacement would be the best option. His gated cardiac CTA shows anatomy suitable for TAVR using a 23 mm Edwards SAPIEN 3 valve. His abdominal and pelvic CTA shows adequate pelvic vascular anatomy for transfemoral access but there is significant protruding soft plaque in the descending thoracic aorta that may be a source of embolus with device advancement. We reviewed this with our multidisciplinary heart valve group and felt that the best option would be to use alternative access through the left common carotid artery which has no significant disease and a straight course to the aortic valve.  The patient and his wife for counseled at length regarding treatment alternatives for management of severe symptomatic aortic stenosis. The risks and benefits of surgical intervention has been discussed in detail. Long-term prognosis with medical therapy was  discussed. Alternative approaches such as conventional surgical aortic valve replacement, transcatheter aortic valve replacement, and palliative medical therapy were compared and contrasted at length. This discussion was placed in the context of the patient's own specific clinical presentation and past medical history. All of their questions have been addressed.  Following the decision to proceed with transcatheter aortic valve replacement, a discussion was held regarding what types of management strategies would be attempted intraoperatively in the event of life-threatening complications, including whether or not the patient would be considered a candidate for the use of cardiopulmonary bypass and/or conversion to open sternotomy for attempted surgical intervention. The patient is relatively young and I would consider emergent sternotomy to manage any intraoperative complications depending on the situation. The patient is aware of the fact that transient use of cardiopulmonary bypass may be necessary. The patient has been advised of a variety of complications that might develop including but not limited to risks of death, stroke, paravalvular leak, aortic dissection or other major vascular complications, aortic annulus rupture, device embolization, cardiac rupture or perforation, mitral regurgitation, acute myocardial infarction, arrhythmia, heart block or bradycardia requiring permanent pacemaker placement, congestive heart failure, respiratory failure, renal failure, pneumonia, infection, other late complications related to structural valve deterioration or migration, or other complications that might ultimately cause a temporary or permanent loss of functional independence or other long term morbidity.  The patient provides full informed consent for the procedure as described and all questions were answered.   Plan:   Transcatheter aortic valve replacement using a SAPIEN 3 valve via left carotid artery  approach.  Gaye Pollack, MD

## 2021-10-13 ENCOUNTER — Other Ambulatory Visit: Payer: Self-pay | Admitting: Physician Assistant

## 2021-10-13 ENCOUNTER — Inpatient Hospital Stay (HOSPITAL_COMMUNITY): Payer: Medicare Other | Admitting: Vascular Surgery

## 2021-10-13 ENCOUNTER — Other Ambulatory Visit: Payer: Self-pay

## 2021-10-13 ENCOUNTER — Encounter (HOSPITAL_COMMUNITY)
Admission: RE | Disposition: A | Discharge status: Home / Self Care | Payer: Self-pay | Source: Home / Self Care | Attending: Internal Medicine

## 2021-10-13 ENCOUNTER — Encounter (HOSPITAL_COMMUNITY): Payer: Self-pay | Admitting: Internal Medicine

## 2021-10-13 ENCOUNTER — Inpatient Hospital Stay (HOSPITAL_COMMUNITY): Payer: Medicare Other

## 2021-10-13 ENCOUNTER — Inpatient Hospital Stay (HOSPITAL_COMMUNITY): Payer: Medicare Other | Admitting: Certified Registered"

## 2021-10-13 ENCOUNTER — Inpatient Hospital Stay (HOSPITAL_COMMUNITY)
Admission: RE | Admit: 2021-10-13 | Discharge: 2021-10-14 | DRG: 267 | Disposition: A | Discharge status: Home / Self Care | Payer: Medicare Other | Attending: Internal Medicine | Admitting: Internal Medicine

## 2021-10-13 DIAGNOSIS — Z7982 Long term (current) use of aspirin: Secondary | ICD-10-CM

## 2021-10-13 DIAGNOSIS — I2510 Atherosclerotic heart disease of native coronary artery without angina pectoris: Secondary | ICD-10-CM | POA: Diagnosis not present

## 2021-10-13 DIAGNOSIS — Z820 Family history of epilepsy and other diseases of the nervous system: Secondary | ICD-10-CM | POA: Diagnosis not present

## 2021-10-13 DIAGNOSIS — Z8249 Family history of ischemic heart disease and other diseases of the circulatory system: Secondary | ICD-10-CM

## 2021-10-13 DIAGNOSIS — Z801 Family history of malignant neoplasm of trachea, bronchus and lung: Secondary | ICD-10-CM

## 2021-10-13 DIAGNOSIS — I5032 Chronic diastolic (congestive) heart failure: Secondary | ICD-10-CM | POA: Diagnosis not present

## 2021-10-13 DIAGNOSIS — I25810 Atherosclerosis of coronary artery bypass graft(s) without angina pectoris: Secondary | ICD-10-CM | POA: Diagnosis present

## 2021-10-13 DIAGNOSIS — E119 Type 2 diabetes mellitus without complications: Secondary | ICD-10-CM | POA: Diagnosis not present

## 2021-10-13 DIAGNOSIS — Z79899 Other long term (current) drug therapy: Secondary | ICD-10-CM | POA: Diagnosis not present

## 2021-10-13 DIAGNOSIS — Z823 Family history of stroke: Secondary | ICD-10-CM | POA: Diagnosis not present

## 2021-10-13 DIAGNOSIS — Z8546 Personal history of malignant neoplasm of prostate: Secondary | ICD-10-CM

## 2021-10-13 DIAGNOSIS — C61 Malignant neoplasm of prostate: Secondary | ICD-10-CM | POA: Diagnosis present

## 2021-10-13 DIAGNOSIS — Z888 Allergy status to other drugs, medicaments and biological substances status: Secondary | ICD-10-CM

## 2021-10-13 DIAGNOSIS — I350 Nonrheumatic aortic (valve) stenosis: Principal | ICD-10-CM | POA: Diagnosis present

## 2021-10-13 DIAGNOSIS — Z006 Encounter for examination for normal comparison and control in clinical research program: Secondary | ICD-10-CM

## 2021-10-13 DIAGNOSIS — Z952 Presence of prosthetic heart valve: Secondary | ICD-10-CM | POA: Diagnosis not present

## 2021-10-13 DIAGNOSIS — Z87442 Personal history of urinary calculi: Secondary | ICD-10-CM

## 2021-10-13 DIAGNOSIS — Z884 Allergy status to anesthetic agent status: Secondary | ICD-10-CM | POA: Diagnosis not present

## 2021-10-13 DIAGNOSIS — I110 Hypertensive heart disease with heart failure: Secondary | ICD-10-CM | POA: Diagnosis not present

## 2021-10-13 DIAGNOSIS — E785 Hyperlipidemia, unspecified: Secondary | ICD-10-CM | POA: Diagnosis present

## 2021-10-13 DIAGNOSIS — Z808 Family history of malignant neoplasm of other organs or systems: Secondary | ICD-10-CM

## 2021-10-13 DIAGNOSIS — I700 Atherosclerosis of aorta: Secondary | ICD-10-CM | POA: Diagnosis present

## 2021-10-13 DIAGNOSIS — J45909 Unspecified asthma, uncomplicated: Secondary | ICD-10-CM

## 2021-10-13 DIAGNOSIS — Z86010 Personal history of colonic polyps: Secondary | ICD-10-CM | POA: Diagnosis not present

## 2021-10-13 DIAGNOSIS — Z951 Presence of aortocoronary bypass graft: Secondary | ICD-10-CM

## 2021-10-13 HISTORY — DX: Presence of prosthetic heart valve: Z95.2

## 2021-10-13 HISTORY — PX: TRANSCATHETER AORTIC VALVE REPLACEMENT, CAROTID: SHX6798

## 2021-10-13 HISTORY — PX: INTRAOPERATIVE TRANSESOPHAGEAL ECHOCARDIOGRAM: SHX5062

## 2021-10-13 HISTORY — PX: ULTRASOUND GUIDANCE FOR VASCULAR ACCESS: SHX6516

## 2021-10-13 LAB — POCT I-STAT, CHEM 8
BUN: 22 mg/dL (ref 8–23)
BUN: 20 mg/dL (ref 8–23)
Calcium, Ion: 1.31 mmol/L (ref 1.15–1.40)
Calcium, Ion: 1.29 mmol/L (ref 1.15–1.40)
Chloride: 107 mmol/L (ref 98–111)
Chloride: 105 mmol/L (ref 98–111)
Creatinine, Ser: 0.9 mg/dL (ref 0.61–1.24)
Creatinine, Ser: 0.9 mg/dL (ref 0.61–1.24)
Glucose, Bld: 108 mg/dL — ABNORMAL HIGH (ref 70–99)
Glucose, Bld: 170 mg/dL — ABNORMAL HIGH (ref 70–99)
HCT: 34 % — ABNORMAL LOW (ref 39.0–52.0)
HCT: 34 % — ABNORMAL LOW (ref 39.0–52.0)
Hemoglobin: 11.6 g/dL — ABNORMAL LOW (ref 13.0–17.0)
Hemoglobin: 11.6 g/dL — ABNORMAL LOW (ref 13.0–17.0)
Potassium: 3.8 mmol/L (ref 3.5–5.1)
Potassium: 3.8 mmol/L (ref 3.5–5.1)
Sodium: 142 mmol/L (ref 135–145)
Sodium: 140 mmol/L (ref 135–145)
TCO2: 24 mmol/L (ref 22–32)
TCO2: 23 mmol/L (ref 22–32)

## 2021-10-13 LAB — GLUCOSE, CAPILLARY: Glucose-Capillary: 248 mg/dL — ABNORMAL HIGH (ref 70–99)

## 2021-10-13 SURGERY — IMPLANTATION, AORTIC VALVE, TRANSCATHETER, CAROTID ARTERY APPROACH
Anesthesia: General | Site: Neck | Laterality: Right

## 2021-10-13 MED ORDER — OXYCODONE HCL 5 MG PO TABS: 5.0000 mg | ORAL_TABLET | ORAL | Status: DC | PRN

## 2021-10-13 MED ORDER — TRAMADOL HCL 50 MG PO TABS: 50.0000 mg | ORAL_TABLET | ORAL | Status: DC | PRN

## 2021-10-13 MED ORDER — FENTANYL CITRATE (PF) 250 MCG/5ML IJ SOLN
INTRAMUSCULAR | Status: DC | PRN
  Administered 2021-10-13: 100 ug via INTRAVENOUS
  Administered 2021-10-13: 150 ug via INTRAVENOUS

## 2021-10-13 MED ORDER — ONDANSETRON HCL 4 MG/2ML IJ SOLN
INTRAMUSCULAR | Status: DC | PRN
  Administered 2021-10-13: 4 mg via INTRAVENOUS

## 2021-10-13 MED ORDER — CHLORHEXIDINE GLUCONATE 0.12 % MT SOLN: 15.0000 mL | Freq: Once | OROMUCOSAL | Status: DC

## 2021-10-13 MED ORDER — ACETAMINOPHEN 650 MG RE SUPP: 650.0000 mg | Freq: Four times a day (QID) | RECTAL | Status: DC | PRN

## 2021-10-13 MED ORDER — CHLORHEXIDINE GLUCONATE 4 % EX LIQD: 30.0000 mL | CUTANEOUS | Status: DC

## 2021-10-13 MED ORDER — PROPOFOL 10 MG/ML IV BOLUS
INTRAVENOUS | Status: AC
  Filled 2021-10-13: qty 20

## 2021-10-13 MED ORDER — MORPHINE SULFATE (PF) 2 MG/ML IV SOLN: 1.0000 mg | INTRAVENOUS | Status: DC | PRN

## 2021-10-13 MED ORDER — ONDANSETRON HCL 4 MG/2ML IJ SOLN: 4.0000 mg | Freq: Four times a day (QID) | INTRAMUSCULAR | Status: DC | PRN

## 2021-10-13 MED ORDER — CHLORHEXIDINE GLUCONATE 4 % EX LIQD: 60.0000 mL | Freq: Once | CUTANEOUS | Status: DC

## 2021-10-13 MED ORDER — NITROGLYCERIN 0.2 MG/ML ON CALL CATH LAB
INTRAVENOUS | Status: DC | PRN
  Administered 2021-10-13 (×2): 20 ug via INTRAVENOUS

## 2021-10-13 MED ORDER — SODIUM CHLORIDE 0.9% FLUSH
3.0000 mL | Freq: Two times a day (BID) | INTRAVENOUS | Status: DC
  Administered 2021-10-13 – 2021-10-14 (×2): 3 mL via INTRAVENOUS

## 2021-10-13 MED ORDER — PROPOFOL 10 MG/ML IV BOLUS
INTRAVENOUS | Status: DC | PRN
  Administered 2021-10-13: 50 mg via INTRAVENOUS
  Administered 2021-10-13: 200 mg via INTRAVENOUS
  Administered 2021-10-13 (×2): 50 mg via INTRAVENOUS

## 2021-10-13 MED ORDER — ATORVASTATIN CALCIUM 80 MG PO TABS
80.0000 mg | ORAL_TABLET | Freq: Every day | ORAL | Status: DC
  Administered 2021-10-14: 80 mg via ORAL
  Filled 2021-10-13: qty 1

## 2021-10-13 MED ORDER — INSULIN ASPART 100 UNIT/ML IJ SOLN
0.0000 [IU] | Freq: Three times a day (TID) | INTRAMUSCULAR | Status: DC
  Administered 2021-10-13: 8 [IU] via SUBCUTANEOUS
  Administered 2021-10-14: 2 [IU] via SUBCUTANEOUS

## 2021-10-13 MED ORDER — PROTAMINE SULFATE 10 MG/ML IV SOLN
INTRAVENOUS | Status: DC | PRN
  Administered 2021-10-13: 140 mg via INTRAVENOUS
  Administered 2021-10-13: 10 mg via INTRAVENOUS

## 2021-10-13 MED ORDER — CHLORHEXIDINE GLUCONATE 0.12 % MT SOLN: 15.0000 mL | Freq: Once | OROMUCOSAL | Status: AC

## 2021-10-13 MED ORDER — LACTATED RINGERS IV SOLN: INTRAVENOUS | Status: DC

## 2021-10-13 MED ORDER — CHLORHEXIDINE GLUCONATE 0.12 % MT SOLN
OROMUCOSAL | Status: AC
  Administered 2021-10-13: 15 mL via OROMUCOSAL
  Filled 2021-10-13: qty 15

## 2021-10-13 MED ORDER — FENOFIBRATE 160 MG PO TABS
160.0000 mg | ORAL_TABLET | Freq: Every day | ORAL | Status: DC
  Administered 2021-10-14: 160 mg via ORAL
  Filled 2021-10-13: qty 1

## 2021-10-13 MED ORDER — ACETAMINOPHEN 325 MG PO TABS: 650.0000 mg | ORAL_TABLET | Freq: Four times a day (QID) | ORAL | Status: DC | PRN

## 2021-10-13 MED ORDER — SODIUM CHLORIDE 0.9 % IV SOLN: INTRAVENOUS | Status: DC

## 2021-10-13 MED ORDER — DEXAMETHASONE SODIUM PHOSPHATE 10 MG/ML IJ SOLN
INTRAMUSCULAR | Status: DC | PRN
  Administered 2021-10-13: 10 mg via INTRAVENOUS

## 2021-10-13 MED ORDER — SUGAMMADEX SODIUM 200 MG/2ML IV SOLN
INTRAVENOUS | Status: DC | PRN
  Administered 2021-10-13: 200 mg via INTRAVENOUS

## 2021-10-13 MED ORDER — CEFAZOLIN SODIUM-DEXTROSE 2-4 GM/100ML-% IV SOLN
2.0000 g | Freq: Three times a day (TID) | INTRAVENOUS | Status: AC
  Administered 2021-10-13 – 2021-10-14 (×2): 2 g via INTRAVENOUS
  Filled 2021-10-13 (×2): qty 100

## 2021-10-13 MED ORDER — FENTANYL CITRATE (PF) 250 MCG/5ML IJ SOLN
INTRAMUSCULAR | Status: AC
  Filled 2021-10-13: qty 5

## 2021-10-13 MED ORDER — NITROGLYCERIN IN D5W 200-5 MCG/ML-% IV SOLN: 0.0000 ug/min | INTRAVENOUS | Status: DC

## 2021-10-13 MED ORDER — ICOSAPENT ETHYL 1 G PO CAPS
2.0000 g | ORAL_CAPSULE | Freq: Two times a day (BID) | ORAL | Status: DC
  Administered 2021-10-13 – 2021-10-14 (×2): 2 g via ORAL
  Filled 2021-10-13 (×3): qty 2

## 2021-10-13 MED ORDER — SODIUM CHLORIDE 0.9 % IV SOLN: 250.0000 mL | INTRAVENOUS | Status: DC | PRN

## 2021-10-13 MED ORDER — HEPARIN 6000 UNIT IRRIGATION SOLUTION
Status: DC | PRN
  Administered 2021-10-13 (×3): 1

## 2021-10-13 MED ORDER — ASPIRIN 81 MG PO CHEW
81.0000 mg | CHEWABLE_TABLET | Freq: Every day | ORAL | Status: DC
  Administered 2021-10-14: 81 mg via ORAL
  Filled 2021-10-13: qty 1

## 2021-10-13 MED ORDER — ROCURONIUM BROMIDE 10 MG/ML (PF) SYRINGE
PREFILLED_SYRINGE | INTRAVENOUS | Status: DC | PRN
  Administered 2021-10-13: 50 mg via INTRAVENOUS
  Administered 2021-10-13: 30 mg via INTRAVENOUS

## 2021-10-13 MED ORDER — SODIUM CHLORIDE 0.9 % IV SOLN: INTRAVENOUS | Status: AC

## 2021-10-13 MED ORDER — 0.9 % SODIUM CHLORIDE (POUR BTL) OPTIME
TOPICAL | Status: DC | PRN
  Administered 2021-10-13: 1000 mL

## 2021-10-13 MED ORDER — LORATADINE 10 MG PO TABS: 5.0000 mg | ORAL_TABLET | Freq: Every evening | ORAL | Status: DC

## 2021-10-13 MED ORDER — CLEVIDIPINE BUTYRATE 0.5 MG/ML IV EMUL
INTRAVENOUS | Status: DC | PRN
  Administered 2021-10-13: 2 mg/h via INTRAVENOUS

## 2021-10-13 MED ORDER — ORAL CARE MOUTH RINSE: 15.0000 mL | Freq: Once | OROMUCOSAL | Status: AC

## 2021-10-13 MED ORDER — HEPARIN SODIUM (PORCINE) 1000 UNIT/ML IJ SOLN
INTRAMUSCULAR | Status: DC | PRN
  Administered 2021-10-13: 5000 [IU] via INTRAVENOUS
  Administered 2021-10-13: 10000 [IU] via INTRAVENOUS

## 2021-10-13 MED ORDER — IODIXANOL 320 MG/ML IV SOLN
INTRAVENOUS | Status: DC | PRN
  Administered 2021-10-13: 105 mL via INTRA_ARTERIAL

## 2021-10-13 MED ORDER — SODIUM CHLORIDE 0.9% FLUSH: 3.0000 mL | INTRAVENOUS | Status: DC | PRN

## 2021-10-13 SURGICAL SUPPLY — 73 items
ADH SKN CLS APL DERMABOND .7 (GAUZE/BANDAGES/DRESSINGS) ×3
BAG BANDED W/RUBBER/TAPE 36X54 (MISCELLANEOUS) ×5 IMPLANT
BAG DECANTER FOR FLEXI CONT (MISCELLANEOUS) IMPLANT
BAG EQP BAND 135X91 W/RBR TAPE (MISCELLANEOUS) ×3
BAG SNAP BAND KOVER 36X36 (MISCELLANEOUS) ×9 IMPLANT
BLADE CLIPPER SURG (BLADE) ×5 IMPLANT
CABLE SURGICAL S-101-97-12 (CABLE) ×1 IMPLANT
CATH DIAG EXPO 6F AL1 (CATHETERS) ×5 IMPLANT
CATH DIAG EXPO 6F VENT PIG 145 (CATHETERS) ×10 IMPLANT
CATH S G BIP PACING (CATHETERS) ×9 IMPLANT
CLIP VESOCCLUDE MED 24/CT (CLIP) ×1 IMPLANT
CLIP VESOCCLUDE SM WIDE 24/CT (CLIP) ×1 IMPLANT
CLOSURE MYNX CONTROL 6F/7F (Vascular Products) ×1 IMPLANT
CNTNR URN SCR LID CUP LEK RST (MISCELLANEOUS) ×24 IMPLANT
CONT SPEC 4OZ STRL OR WHT (MISCELLANEOUS) ×8
COVER BACK TABLE 24X17X13 BIG (DRAPES) ×5 IMPLANT
COVER DOME SNAP 22 D (MISCELLANEOUS) ×5 IMPLANT
DERMABOND ADVANCED (GAUZE/BANDAGES/DRESSINGS) ×1
DERMABOND ADVANCED .7 DNX12 (GAUZE/BANDAGES/DRESSINGS) ×4 IMPLANT
DRAPE C-ARM 42X72 X-RAY (DRAPES) ×1 IMPLANT
DRAPE INCISE IOBAN 66X45 STRL (DRAPES) ×1 IMPLANT
DRSG TEGADERM 4X4.75 (GAUZE/BANDAGES/DRESSINGS) ×5 IMPLANT
ELECT REM PT RETURN 9FT ADLT (ELECTROSURGICAL) ×4
ELECTRODE REM PT RTRN 9FT ADLT (ELECTROSURGICAL) ×8 IMPLANT
FELT TEFLON 6X6 (MISCELLANEOUS) ×1 IMPLANT
GAUZE 4X4 16PLY ~~LOC~~+RFID DBL (SPONGE) ×5 IMPLANT
GAUZE SPONGE 4X4 12PLY STRL (GAUZE/BANDAGES/DRESSINGS) ×5 IMPLANT
GLOVE BIO SURGEON STRL SZ7.5 (GLOVE) ×5 IMPLANT
GLOVE BIO SURGEON STRL SZ8 (GLOVE) ×5 IMPLANT
GLOVE ORTHO TXT STRL SZ7.5 (GLOVE) ×10 IMPLANT
GLOVE SURG MICRO LTX SZ7 (GLOVE) ×10 IMPLANT
GOWN STRL REUS W/ TWL LRG LVL3 (GOWN DISPOSABLE) ×12 IMPLANT
GOWN STRL REUS W/ TWL XL LVL3 (GOWN DISPOSABLE) ×24 IMPLANT
GOWN STRL REUS W/TWL LRG LVL3 (GOWN DISPOSABLE) ×12
GOWN STRL REUS W/TWL XL LVL3 (GOWN DISPOSABLE) ×24
KIT BASIN OR (CUSTOM PROCEDURE TRAY) ×5 IMPLANT
KIT HEART LEFT (KITS) ×5 IMPLANT
KIT SAPIAN 3 ULTRA RESILIA 23 (Valve) ×1 IMPLANT
KIT SUCTION CATH 14FR (SUCTIONS) ×1 IMPLANT
KIT TURNOVER KIT B (KITS) ×5 IMPLANT
NDL PERC 18GX7CM (NEEDLE) ×4 IMPLANT
NEEDLE 22X1 1/2 (OR ONLY) (NEEDLE) ×1 IMPLANT
NEEDLE PERC 18GX7CM (NEEDLE) ×4 IMPLANT
NS IRRIG 1000ML POUR BTL (IV SOLUTION) ×13 IMPLANT
PACK ENDOVASCULAR (PACKS) ×5 IMPLANT
PAD ARMBOARD 7.5X6 YLW CONV (MISCELLANEOUS) ×10 IMPLANT
PAD ELECT DEFIB RADIOL ZOLL (MISCELLANEOUS) ×5 IMPLANT
POSITIONER HEAD DONUT 9IN (MISCELLANEOUS) ×5 IMPLANT
SET MICROPUNCTURE 5F STIFF (MISCELLANEOUS) ×5 IMPLANT
SHEATH PINNACLE 6F 10CM (SHEATH) ×10 IMPLANT
SPONGE T-LAP 18X18 ~~LOC~~+RFID (SPONGE) ×18 IMPLANT
SPONGE T-LAP 4X18 ~~LOC~~+RFID (SPONGE) ×5 IMPLANT
STOPCOCK MORSE 400PSI 3WAY (MISCELLANEOUS) ×26 IMPLANT
SUT GORETEX CV 4 TH 22 36 (SUTURE) ×5 IMPLANT
SUT GORETEX TH-18 36 INCH (SUTURE) ×10 IMPLANT
SUT SILK  1 MH (SUTURE)
SUT SILK 1 MH (SUTURE) ×4 IMPLANT
SUT SILK 2 0 SH CR/8 (SUTURE) ×1 IMPLANT
SUT VIC AB 2-0 CT1 27 (SUTURE) ×4
SUT VIC AB 2-0 CT1 TAPERPNT 27 (SUTURE) ×4 IMPLANT
SUT VIC AB 3-0 SH 8-18 (SUTURE) ×9 IMPLANT
SYR 10ML LL (SYRINGE) ×15 IMPLANT
SYR 50ML LL SCALE MARK (SYRINGE) ×5 IMPLANT
TOWEL GREEN STERILE (TOWEL DISPOSABLE) ×5 IMPLANT
TOWEL GREEN STERILE FF (TOWEL DISPOSABLE) ×5 IMPLANT
TRANSDUCER W/STOPCOCK (MISCELLANEOUS) ×10 IMPLANT
TUBING CIL FLEX 10 FLL-RA (TUBING) ×1 IMPLANT
TUBING HIGH PRESSURE 120CM (CONNECTOR) ×5 IMPLANT
WIRE AMPLATZ SS-J .035X180CM (WIRE) ×5 IMPLANT
WIRE EMERALD 3MM-J .035X260CM (WIRE) ×5 IMPLANT
WIRE EMERALD ST .035X260CM (WIRE) ×1 IMPLANT
WIRE SAFARI SM CURVE 275 (WIRE) ×1 IMPLANT
WIRE TORQFLEX AUST .018X40CM (WIRE) ×1 IMPLANT

## 2021-10-13 NOTE — Anesthesia Postprocedure Evaluation (Signed)
Anesthesia Post Note  Patient: Melvin Dunn.  Procedure(s) Performed: Transcatheter Aortic Valve Replacement-Carotid (Left: Neck) INTRAOPERATIVE TRANSESOPHAGEAL ECHOCARDIOGRAM (Esophagus) ULTRASOUND GUIDANCE FOR VASCULAR ACCESS (Right: Groin)     Patient location during evaluation: Cath Lab Anesthesia Type: General Level of consciousness: awake Pain management: pain level controlled Vital Signs Assessment: post-procedure vital signs reviewed and stable Respiratory status: spontaneous breathing, nonlabored ventilation, respiratory function stable and patient connected to nasal cannula oxygen Cardiovascular status: blood pressure returned to baseline and stable Postop Assessment: no apparent nausea or vomiting Anesthetic complications: no   No notable events documented.  Last Vitals:  Vitals:   10/13/21 1719 10/13/21 2028  BP: 127/63 (!) 124/55  Pulse: 67 82  Resp: 20 18  Temp: 36.6 C 36.5 C  SpO2: 97% 94%    Last Pain:  Vitals:   10/13/21 2028  TempSrc: Oral  PainSc:                  Karyl Kinnier Sherryann Frese

## 2021-10-13 NOTE — Anesthesia Procedure Notes (Signed)
Procedure Name: Intubation Date/Time: 10/13/2021 11:53 AM Performed by: Griffin Dakin, CRNA Pre-anesthesia Checklist: Patient identified, Emergency Drugs available, Suction available and Patient being monitored Patient Re-evaluated:Patient Re-evaluated prior to induction Oxygen Delivery Method: Circle system utilized Preoxygenation: Pre-oxygenation with 100% oxygen Induction Type: IV induction Ventilation: Mask ventilation without difficulty Laryngoscope Size: Glidescope and 4 Grade View: Grade I Tube type: Oral Tube size: 7.5 mm Number of attempts: 1 Airway Equipment and Method: Rigid stylet and Video-laryngoscopy Placement Confirmation: ETT inserted through vocal cords under direct vision, positive ETCO2 and breath sounds checked- equal and bilateral Secured at: 23 cm Tube secured with: Tape Dental Injury: Bloody posterior oropharynx

## 2021-10-13 NOTE — Progress Notes (Signed)
  [facility] VALVE TEAM  Patient doing well s/p TAVR. He is hemodynamically stable. Groin sites stable. ECG with sinus brady and no high grade block. Arterial line discontinued and transferred to 4E. Plan for early ambulation after bedrest completed and hopeful discharge over the next 24-48 hours.   Angelena Form PA-C  [facility]  Pager [phone]

## 2021-10-13 NOTE — Progress Notes (Signed)
Patient arrived from cath lab to 4E 27, patient with right groin and Left neck incision clean dry intact groin level 0 at this time. Patient vital signs obtained and CCMD made aware. Patient oriented to room. Call bell with in reach. Aracelis Ulrey, Bettina Gavia  RN

## 2021-10-13 NOTE — Anesthesia Procedure Notes (Signed)
Arterial Line Insertion Start/End5/30/2023 9:50 AM Performed by: Janace Litten, CRNA, CRNA  Preanesthetic checklist: patient identified, IV checked, surgical consent, monitors and equipment checked and pre-op evaluation Right, radial was placed Catheter size: 20 G Hand hygiene performed  and maximum sterile barriers used   Attempts: 1 Procedure performed without using ultrasound guided technique. Following insertion, dressing applied and Biopatch. Post procedure assessment: normal  Patient tolerated the procedure well with no immediate complications.

## 2021-10-13 NOTE — Progress Notes (Signed)
Patient ID: Melvin Dunn., male   DOB: [birth year], 71 y.o.   MRN: [id number] T[facility]   Still in cath lab recovery, awaiting bed on 4E.  Hemodynamically stable in sinus rhythm.  Awake and alert, neuro intact  Left neck incision looks good.  Right groin site ok.

## 2021-10-13 NOTE — Transfer of Care (Signed)
Immediate Anesthesia Transfer of Care Note  Patient: Melvin Dunn.  Procedure(s) Performed: Transcatheter Aortic Valve Replacement-Carotid (Left: Neck) INTRAOPERATIVE TRANSESOPHAGEAL ECHOCARDIOGRAM (Esophagus) ULTRASOUND GUIDANCE FOR VASCULAR ACCESS (Right: Groin)  Patient Location: Cath Lab  Anesthesia Type:General  Level of Consciousness: awake, alert  and oriented  Airway & Oxygen Therapy: Patient Spontanous Breathing  Post-op Assessment: Report given to RN and Post -op Vital signs reviewed and stable  Post vital signs: Reviewed and stable  Last Vitals:  Vitals Value Taken Time  BP    Temp    Pulse    Resp    SpO2      Last Pain:  Vitals:   10/13/21 0919  TempSrc:   PainSc: 0-No pain         Complications: No notable events documented.

## 2021-10-13 NOTE — Op Note (Signed)
[facility]   MULTIDISCIPLINARY HEART VALVE TEAM     TAVR OPERATIVE NOTE     Date of Procedure:                10/13/2021   Preoperative Diagnosis:      Severe Aortic Stenosis    Postoperative Diagnosis:    Same    Procedure:        Transcatheter Aortic Valve Replacement - Left Common Carotid Artery Approach            Edwards Sapien 3 Ultra Resilia THV (size 23 mm, model # 9755RSL, serial # [id number])           Co-Surgeons:                        Gaye Pollack, MD and Lenna Sciara, MD   Anesthesiologist:                  Perfecto Kingdom, MD   Echocardiographer:              Edmonia James, MD   Pre-operative Echo Findings: Severe aortic stenosis Normal left ventricular systolic function   Post-operative Echo Findings: Trivial paravalvular leak Normal left ventricular systolic function     BRIEF CLINICAL NOTE AND INDICATIONS FOR SURGERY The patient is a 71 year old male with a history of severe symptomatic aortic stenosis, coronary artery disease status post CABG, hypertension, and hyperlipidemia who is referred by Dr. Golden Hurter for recommendations regarding his severe symptomatic aortic stenosis.  The patient and his wife for counseled at length regarding treatment alternatives for management of severe symptomatic aortic stenosis. The risks and benefits of surgical intervention has been discussed in detail. Long-term prognosis with medical therapy was discussed. Alternative approaches such as conventional surgical aortic valve replacement, transcatheter aortic valve replacement, and palliative medical therapy were compared and contrasted at length. This discussion was placed in the context of the patient's own specific clinical presentation and past medical history. All of their questions have been addressed.  Following the decision to proceed with transcatheter aortic valve replacement, a discussion was held regarding what types of management strategies would be  attempted intraoperatively in the event of life-threatening complications, including whether or not the patient would be considered a candidate for the use of cardiopulmonary bypass and/or conversion to open sternotomy for attempted surgical intervention. The patient is relatively young and I would consider emergent sternotomy to manage any intraoperative complications depending on the situation. The patient is aware of the fact that transient use of cardiopulmonary bypass may be necessary. The patient has been advised of a variety of complications that might develop including but not limited to risks of death, stroke, paravalvular leak, aortic dissection or other major vascular complications, aortic annulus rupture, device embolization, cardiac rupture or perforation, mitral regurgitation, acute myocardial infarction, arrhythmia, heart block or bradycardia requiring permanent pacemaker placement, congestive heart failure, respiratory failure, renal failure, pneumonia, infection, other late complications related to structural valve deterioration or migration, or other complications that might ultimately cause a temporary or permanent loss of functional independence or other long term morbidity. The patient provides full informed consent for the procedure as described and all questions were answered.         DETAILS OF THE OPERATIVE PROCEDURE   PREPARATION:     The patient was brought to the operating room on the above mentioned date and appropriate monitoring was established by the anesthesia team. The  patient was placed in the supine position on the operating table.  Intravenous antibiotics were administered. General endotracheal anesthesia was induced uneventfully.   Baseline transesophageal echocardiogram was performed. The patient's neck, chest,abdomen and both groins were prepped and draped in a sterile manner. A time out procedure was performed.     PERIPHERAL ACCESS:     Using the modified  Seldinger technique, femoral arterial and venous access was obtained with placement of 6 Fr sheaths on the right side.  A pigtail diagnostic catheter was passed through the right arterial sheath under fluoroscopic guidance into the aortic root. Aortic root angiography was performed in order to determine the optimal angiographic angle for valve deployment.     LEFT COMMON CAROTID ARTERY ACCESS:    A transverse incision was made at the base of the left neck between the heads of the sternocleidomastoid muscle and carried down through the subcutaneous tissue using electrocautery. The platysma muscle was divided. The carotid sheath was opened and the common carotid artery identified. The patient was heparinized systemically and ACT verified > 250 seconds.  A double concentric purse string suture of CV-4 gortex was placed in the anterior wall of the artery. The artery was cannulated with a needle and a J- wire advanced into the ascending aorta. A 6 F sheath was inserted over the wire. The aortic valve  was crossed with an AL-1 catheter and a straight wire. This was exchanged for a pigtail catheter and simultaneous LV/aortic gradient was measured. The pigtail catheter was exchanged for a Safari wire in the LV apex. Direct LV pacing threshold was tested and was satisfactory. Then a ** E-sheath was inserted into the carotid artery and the tip advanced into the aortic arch.    BALLOON AORTIC VALVULOPLASTY:    Not performed     TRANSCATHETER HEART VALVE DEPLOYMENT:    An Edwards Sapien 3 Ultra transcatheter heart valve (size 23 mm) was prepared and crimped per manufacturer's guidelines, and the proper orientation of the valve is confirmed on the [employer] delivery system. The valve was advanced through the introducer sheath using normal technique until in an appropriate position in the ascending aorta beyond the sheath tip. The balloon was then retracted and using the fine-tuning wheel was centered on the  valve. The valve was carefully positioned across the aortic valve annulus. The Commander catheter was retracted using normal technique. Once final position of the valve has been confirmed by angiographic assessment, the valve is deployed during rapid ventricular pacing to maintain systolic blood pressure < 50 mmHg and pulse pressure < 10 mmHg. The balloon inflation is held for >3 seconds after reaching full deployment volume. Once the balloon has fully deflated the balloon is retracted into the ascending aorta and valve function is assessed using echocardiography. There is felt to be trivial paravalvular leak and no central aortic insufficiency.  The patient's hemodynamic recovery following valve deployment is good.  The deployment balloon and guidewire are both removed.      PROCEDURE COMPLETION:    The sheath was removed and carotid artery closure performed.  Protamine was administered once femoral arterial repair was complete. Hemostasis was complete. The neck incision was closed with 3-0 Vicryl continuous suture for the platysma muscle and subcutaneous tissue in a single layer and the skin with continuous 3-0 Vicryl subcuticular suture. The  pigtail catheter and femoral sheaths were removed with manual pressure used for venous hemostasis.  A Mynx femoral closure device was utilized following removal of the diagnostic  sheath in the right femoral artery.   The patient tolerated the procedure well and is transported to the cath lab recovery area in stable condition. There were no immediate intraoperative complications. All sponge instrument and needle counts are verified correct at completion of the operation.    No blood products were administered during the operation.   The patient received a total of 105 mL of intravenous contrast during the procedure

## 2021-10-13 NOTE — Plan of Care (Signed)

## 2021-10-13 NOTE — Op Note (Addendum)
[facility]   MULTIDISCIPLINARY HEART VALVE TEAM   TAVR OPERATIVE NOTE   Date of Procedure:  10/13/2021  Preoperative Diagnosis: Severe Aortic Stenosis   Postoperative Diagnosis: Same   Procedure:   Transcatheter Aortic Valve Replacement - Left Common Carotid Artery Approach  Edwards Sapien 3 Ultra Resilia THV (size 23 mm, model # 9755RSL, serial # [id number])   Co-Surgeons:  Gaye Pollack, MD and Lenna Sciara, MD   Anesthesiologist:  Perfecto Kingdom, MD  Echocardiographer:  P. Johnsie Cancel, MD  Pre-operative Echo Findings: Severe aortic stenosis Normal left ventricular systolic function  Post-operative Echo Findings: Trivial paravalvular leak Normal left ventricular systolic function   BRIEF CLINICAL NOTE AND INDICATIONS FOR SURGERY  This 71 year old Dunn has stage D, severe, symptomatic aortic stenosis with New York Heart Association class II symptoms of exertional fatigue and shortness of breath consistent with chronic diastolic congestive heart failure. I have personally reviewed his 2D echo, cardiac catheterization, and CTA studies. His echocardiogram shows a calcified and thickened aortic valve with restricted mobility. The mean gradient is Melvin mmHg with a valve area of 0.67 cm and a dimensionless index of 0.24. There is mild aortic insufficiency. Left ventricular ejection fraction is normal. Cardiac catheterization shows a patent left internal mammary graft to the LAD and a patent saphenous vein graft to the second obtuse marginal. The sequential vein graft to the distal right coronary artery and right posterolateral branch has serial high-grade stenoses but since he is not having any chest discomfort it is felt that this could be treated medically for now. I agree that aortic valve replacement is indicated in this patient for relief of his symptoms and to prevent progressive left ventricular deterioration. Given his age and prior coronary bypass surgery I think  that transcatheter aortic valve replacement would be the best option. His gated cardiac CTA shows anatomy suitable for TAVR using a 23 mm Edwards SAPIEN 3 valve. His abdominal and pelvic CTA shows adequate pelvic vascular anatomy for transfemoral access but there is significant protruding soft plaque in the descending thoracic aorta that may be a source of embolus with device advancement. We reviewed this with our multidisciplinary heart valve group and felt that the best option would be to use alternative access through the left common carotid artery which has no significant disease and a straight course to the aortic valve.  The patient and his wife for counseled at length regarding treatment alternatives for management of severe symptomatic aortic stenosis. The risks and benefits of surgical intervention has been discussed in detail. Long-term prognosis with medical therapy was discussed. Alternative approaches such as conventional surgical aortic valve replacement, transcatheter aortic valve replacement, and palliative medical therapy were compared and contrasted at length. This discussion was placed in the context of the patient's own specific clinical presentation and past medical history. All of their questions have been addressed.  Following the decision to proceed with transcatheter aortic valve replacement, a discussion was held regarding what types of management strategies would be attempted intraoperatively in the event of life-threatening complications, including whether or not the patient would be considered a candidate for the use of cardiopulmonary bypass and/or conversion to open sternotomy for attempted surgical intervention. The patient is relatively young and I would consider emergent sternotomy to manage any intraoperative complications depending on the situation. The patient is aware of the fact that transient use of cardiopulmonary bypass may be necessary. The patient has been advised of a  variety of complications that might develop  including but not limited to risks of death, stroke, paravalvular leak, aortic dissection or other major vascular complications, aortic annulus rupture, device embolization, cardiac rupture or perforation, mitral regurgitation, acute myocardial infarction, arrhythmia, heart block or bradycardia requiring permanent pacemaker placement, congestive heart failure, respiratory failure, renal failure, pneumonia, infection, other late complications related to structural valve deterioration or migration, or other complications that might ultimately cause a temporary or permanent loss of functional independence or other long term morbidity. The patient provides full informed consent for the procedure as described and all questions were answered.      DETAILS OF THE OPERATIVE PROCEDURE  PREPARATION:    The patient was brought to the operating room on the above mentioned date and appropriate monitoring was established by the anesthesia team. The patient was placed in the supine position on the operating table.  Intravenous antibiotics were administered. General endotracheal anesthesia was induced uneventfully.  Baseline transesophageal echocardiogram was performed. The patient's neck, chest,abdomen and both groins were prepped and draped in a sterile manner. A time out procedure was performed.   PERIPHERAL ACCESS:    Using the modified Seldinger technique, femoral arterial and venous access was obtained with placement of 6 Fr sheaths on the right side.  A pigtail diagnostic catheter was passed through the right arterial sheath under fluoroscopic guidance into the aortic root. Aortic root angiography was performed in order to determine the optimal angiographic angle for valve deployment.   LEFT COMMON CAROTID ARTERY ACCESS:   A transverse incision was made at the base of the left neck between the heads of the sternocleidomastoid muscle and carried down through the  subcutaneous tissue using electrocautery. The platysma muscle was divided. The carotid sheath was opened and the common carotid artery identified. The patient was heparinized systemically and ACT verified > 250 seconds.  A double concentric purse string suture of CV-4 gortex was placed in the anterior wall of the artery. The artery was cannulated with a needle and a J- wire advanced into the ascending aorta. A 6 F sheath was inserted over the wire. The aortic valve  was crossed with an AL-1 catheter and a straight wire. This was exchanged for a pigtail catheter and simultaneous LV/aortic gradient was measured. The pigtail catheter was exchanged for a Safari wire in the LV apex. Direct LV pacing threshold was tested and was satisfactory. Then a 31F E-sheath was inserted into the carotid artery and the tip advanced into the aortic arch.   BALLOON AORTIC VALVULOPLASTY:   Not performed   TRANSCATHETER HEART VALVE DEPLOYMENT:   An Edwards Sapien 3 Ultra transcatheter heart valve (size 23 mm) was prepared and crimped per manufacturer's guidelines, and the proper orientation of the valve is confirmed on the [employer] delivery system. The valve was advanced through the introducer sheath using normal technique until in an appropriate position in the ascending aorta beyond the sheath tip. The balloon was then retracted and using the fine-tuning wheel was centered on the valve. The valve was carefully positioned across the aortic valve annulus. The Commander catheter was retracted using normal technique. Once final position of the valve has been confirmed by angiographic assessment, the valve is deployed during rapid ventricular pacing to maintain systolic blood pressure < 50 mmHg and pulse pressure < 10 mmHg. The balloon inflation is held for >3 seconds after reaching full deployment volume. Once the balloon has fully deflated the balloon is retracted into the ascending aorta and valve function is assessed  using echocardiography. There  is felt to be trivial paravalvular leak and no central aortic insufficiency.  The patient's hemodynamic recovery following valve deployment is good.  The deployment balloon and guidewire are both removed.    PROCEDURE COMPLETION:   The sheath was removed and carotid artery closure performed.  Protamine was administered once femoral arterial repair was complete. Hemostasis was complete. The neck incision was closed with 3-0 Vicryl continuous suture for the platysma muscle and subcutaneous tissue in a single layer and the skin with continuous 3-0 Vicryl subcuticular suture. The  pigtail catheter and femoral sheaths were removed with manual pressure used for venous hemostasis.  A Mynx femoral closure device was utilized following removal of the diagnostic sheath in the right femoral artery.  The patient tolerated the procedure well and is transported to the cath lab recovery area in stable condition. There were no immediate intraoperative complications. All sponge instrument and needle counts are verified correct at completion of the operation.   No blood products were administered during the operation.  The patient received a total of 105 mL of intravenous contrast during the procedure.   Gaye Pollack, MD 10/13/2021

## 2021-10-13 NOTE — Interval H&P Note (Signed)
History and Physical Interval Note:  10/13/2021 9:42 AM  Melvin Dunn.  has presented today for surgery, with the diagnosis of Severe Aortic Stenosis.  The various methods of treatment have been discussed with the patient and family. After consideration of risks, benefits and other options for treatment, the patient has consented to  Procedure(s): Transcatheter Aortic Valve Replacement-Carotid (Left) INTRAOPERATIVE TRANSESOPHAGEAL ECHOCARDIOGRAM (N/A) as a surgical intervention.  The patient's history has been reviewed, patient examined, no change in status, stable for surgery.  I have reviewed the patient's chart and labs.  Questions were answered to the patient's satisfaction.     Gaye Pollack

## 2021-10-14 ENCOUNTER — Inpatient Hospital Stay (HOSPITAL_COMMUNITY): Payer: Medicare Other

## 2021-10-14 ENCOUNTER — Encounter (HOSPITAL_COMMUNITY): Payer: Self-pay | Admitting: Internal Medicine

## 2021-10-14 DIAGNOSIS — Z952 Presence of prosthetic heart valve: Secondary | ICD-10-CM

## 2021-10-14 LAB — BASIC METABOLIC PANEL
Anion gap: 6 (ref 5–15)
BUN: 21 mg/dL (ref 8–23)
CO2: 22 mmol/L (ref 22–32)
Calcium: 9.4 mg/dL (ref 8.9–10.3)
Chloride: 108 mmol/L (ref 98–111)
Creatinine, Ser: 1.18 mg/dL (ref 0.61–1.24)
GFR, Estimated: >60 mL/min (ref >60)
Glucose, Bld: 154 mg/dL — ABNORMAL HIGH (ref 70–99)
Potassium: 4 mmol/L (ref 3.5–5.1)
Sodium: 136 mmol/L (ref 135–145)

## 2021-10-14 LAB — CBC
HCT: 33.5 % — ABNORMAL LOW (ref 39.0–52.0)
Hemoglobin: 10.9 g/dL — ABNORMAL LOW (ref 13.0–17.0)
MCH: 29 pg (ref 26.0–34.0)
MCHC: 32.5 g/dL (ref 30.0–36.0)
MCV: 89.1 fL (ref 80.0–100.0)
Platelets: 255 10*3/uL (ref 150–400)
RBC: 3.76 MIL/uL — ABNORMAL LOW (ref 4.22–5.81)
RDW: 14.7 % (ref 11.5–15.5)
WBC: 18.6 10*3/uL — ABNORMAL HIGH (ref 4.0–10.5)
nRBC: 0 % (ref 0.0–0.2)

## 2021-10-14 LAB — ECHOCARDIOGRAM COMPLETE
AR max vel: 1.7 cm2
AV Area VTI: 1.74 cm2
AV Area mean vel: 1.72 cm2
AV Mean grad: 16 mmHg
AV Peak grad: 28.4 mmHg
Ao pk vel: 2.66 m/s
Area-P 1/2: 5.5 cm2
Calc EF: 65.7 %
Height: 68 in
P 1/2 time: 232 msec
S' Lateral: 2.9 cm
Single Plane A2C EF: 65.8 %
Single Plane A4C EF: 66.6 %
Weight: 2483.26 oz

## 2021-10-14 LAB — MAGNESIUM: Magnesium: 1.7 mg/dL (ref 1.7–2.4)

## 2021-10-14 LAB — GLUCOSE, CAPILLARY: Glucose-Capillary: 159 mg/dL — ABNORMAL HIGH (ref 70–99)

## 2021-10-14 NOTE — Discharge Instructions (Signed)

## 2021-10-14 NOTE — Progress Notes (Signed)
Patient discharging home with support from wife. IV removed without complications. Tele removed and CCMD notified. Discharge instruction given and medication administration discussed. All questions answered. Melvin Dunn

## 2021-10-14 NOTE — Progress Notes (Signed)
CARDIAC REHAB PHASE I   PRE:  Rate/Rhythm: 88 NSR  BP:  Sitting: 133/59      SaO2: 97 RA  MODE:  Ambulation: 470 ft   POST:  Rate/Rhythm: 102 ST  BP:  Sitting: 152/64      SaO2: 95 RA  Seen pt from [phone], pt was ambulated in hallways with minimal assistance. Pt walked well, did not have any chest pain or unusual SOB. Pt declines the need of DME at home. Pt was returned to bed and educated on site care, restrictions, diet ex guidelines, and  CRPII. Pt declined CRPII at this time.    Melvin Dunn  9:11 AM 10/14/2021

## 2021-10-14 NOTE — Discharge Summary (Addendum)
[facility] VALVE TEAM  Discharge Summary    Patient ID: Melvin Dunn. MRN: [id number]; DOB: [birth year]  Admit date: 10/13/2021 Discharge date: 10/14/2021  Primary Care Provider: Shirline Frees, MD  Primary Cardiologist: Fransico Him, MD / Dr. Ali Lowe & Dr. Cyndia Bent (TAVR)  Discharge Diagnoses    Principal Problem:   S/P TAVR (transcatheter aortic valve replacement) Active Problems:   Hyperlipidemia   Diabetes mellitus without complication (HCC)   Severe aortic stenosis   S/P CABG x 4   CAD (coronary artery disease), native coronary artery   Malignant neoplasm of prostate (HCC)   Allergies Allergies  Allergen Reactions   Lidocaine     Seizure with biopsy with lidocaine this year   Promethazine Hcl     Other reaction(s): stomach upset    Diagnostic Studies/Procedures    TAVR OPERATIVE NOTE     Date of Procedure:                10/13/2021   Preoperative Diagnosis:      Severe Aortic Stenosis    Postoperative Diagnosis:    Same    Procedure:        Transcatheter Aortic Valve Replacement - Left Common Carotid Artery Approach            Edwards Sapien 3 Ultra Resilia THV (size 23 mm, model # 9755RSL, serial # [id number])           Co-Surgeons:                        Gaye Pollack, MD and Lenna Sciara, MD   Anesthesiologist:                  Perfecto Kingdom, MD   Echocardiographer:              Edmonia James, MD   Pre-operative Echo Findings: Severe aortic stenosis Normal left ventricular systolic function   Post-operative Echo Findings: Trivial paravalvular leak Normal left ventricular systolic function  _____________    Echo 10/13/21: completed but pending formal read at the time of discharge   History of Present Illness     Melvin Dunn. is a 71 y.o. male with a history of CAD s/p CABG x4 (CHO 2015), HLD, DM2, HTN, prostate cancer and severe aortic stenosis who presented to [facility] on 10/13/21 for planned TAVR.   He  had an echocardiogram in February 2022 showing a mean gradient of 27 mmHg across aortic valve with a valve area of 1.28 cm consistent with moderate aortic stenosis.  Left ventricular ejection fraction was 65 to 70%.  He then developed exertional shortness of breath and fatigue. His most recent echocardiogram on 07/03/2021 showed EF 65-70%, mean grad 62mHg, peak grad 85.711mg, AVA 0.67cm2, DVI 0.24, SVI 36, mild AI. L/[facility]/17/23 showed a patent LIMA to LAD and vein graft to second obtuse marginal. The sequential vein graft to the right coronary artery and R PLV branch had serial high-grade stenoses but medical therapy was recommended given lack of angina.   The patient has been evaluated by the multidisciplinary valve team and felt to have severe, symptomatic aortic stenosis and to be a suitable candidate for TAVR, which was set up for 10/13/21.   Hospital Course     Consultants: none   Severe AS: s/p successful TAVR with a 23 mm Edwards Sapien 3 Ultra Resilia 23 THV via the left carotid approach on  10/13/21. Carotid approach was chosen given plaque in the aorta on pre TAVR CTS and concern for potential emboli. Post operative echo completed but pending formal read. Groin sites are stable. ECG with sinus brady. There is no evidence of high grade heart block. Continue Asprin alone. Plan for discharge home with close follow up in the office next week.    CAD s/p CABG: [facility] 07/31/21 showed a patent LIMA to LAD and vein graft to second obtuse marginal. The sequential vein graft to the right coronary artery and R PLV branch had serial high-grade stenoses but medical therapy was recommended given lack of angina. Continue aspirin, BB and high intensity statin   HLD: continue statin, Nexlizet, and fenofibrate.   _____________  Discharge Vitals Blood pressure 128/65, pulse 89, temperature 98.1 F (36.7 C), temperature source Oral, resp. rate 17, height '5\' 8"'$  (1.727 m), weight 70.4 kg, SpO2 95 %.  Filed  Weights   10/13/21 0831 10/14/21 0614  Weight: 72.6 kg 70.4 kg     GEN: Well nourished, well developed, in no acute distress HEENT: normal Neck: no JVD or masses Cardiac: RRR; soft flow murmur. No rubs, or gallops,no edema  Respiratory:  clear to auscultation bilaterally, normal work of breathing GI: soft, nontender, nondistended, + BS MS: no deformity or atrophy Skin: warm and dry, no rash Neuro:  Alert and Oriented x 3, Strength and sensation are intact Psych: euthymic mood, full affect   Labs & Radiologic Studies    CBC Recent Labs    10/13/21 1449 10/14/21 0148  WBC  --  18.6*  HGB 11.6* 10.9*  HCT 34.0* 33.5*  MCV  --  89.1  PLT  --  712   Basic Metabolic Panel Recent Labs    10/13/21 1449 10/14/21 0148  NA 140 136  K 3.8 4.0  CL 105 108  CO2  --  22  GLUCOSE 170* 154*  BUN 20 21  CREATININE 0.90 1.18  CALCIUM  --  9.4  MG  --  1.7   Liver Function Tests No results for input(s): AST, ALT, ALKPHOS, BILITOT, PROT, ALBUMIN in the last 72 hours. No results for input(s): LIPASE, AMYLASE in the last 72 hours. Cardiac Enzymes No results for input(s): CKTOTAL, CKMB, CKMBINDEX, TROPONINI in the last 72 hours. BNP Invalid input(s): POCBNP D-Dimer No results for input(s): DDIMER in the last 72 hours. Hemoglobin A1C No results for input(s): HGBA1C in the last 72 hours. Fasting Lipid Panel No results for input(s): CHOL, HDL, LDLCALC, TRIG, CHOLHDL, LDLDIRECT in the last 72 hours. Thyroid Function Tests No results for input(s): TSH, T4TOTAL, T3FREE, THYROIDAB in the last 72 hours.  Invalid input(s): FREET3 _____________  DG Chest 2 View  Result Date: 10/11/2021 CLINICAL DATA:  Preoperative exam. EXAM: CHEST - 2 VIEW COMPARISON:  12/13/2016, 08/04/2021. FINDINGS: The heart size and mediastinal contours are within normal limits. Hyperinflation of the lungs, flattening of the diaphragms, and increased AP diameter of the chest is noted suggesting chronic  obstructive pulmonary disease. There is atherosclerotic calcification of the aorta. No consolidation, effusion, or pneumothorax. Mild degenerative changes in the thoracic spine. Sternotomy wires are noted over the midline. IMPRESSION: 1. No acute cardiopulmonary process. 2. Chronic obstructive pulmonary disease. Electronically Signed   By: Brett Fairy M.D.   On: 10/11/2021 00:14   ECHO TEE  Result Date: 10/13/2021    TRANSESOPHOGEAL ECHO REPORT   Patient Name:   Kamau Weatherall. Date of Exam: 10/13/2021 Medical Rec #:  [id number]  Height:       68.0 in Accession #:    [id number]      Weight:       160.0 lb Date of Birth:  11-14-1950      BSA:          1.859 m Patient Age:    39 years        BP:           156/60 mmHg Patient Gender: M               HR:           98 bpm. Exam Location:  Inpatient Procedure: Transesophageal Echo, 3D Echo, Color Doppler and Cardiac Doppler Indications:     Aortic Stenosis i35.0  History:         Patient has prior history of Echocardiogram examinations, most                  recent 07/03/2021. Prior CABG; Risk Factors:Diabetes and                  Dyslipidemia.  Sonographer:     Raquel Sarna Senior RDCS Referring Phys:  [id number] Early Osmond Diagnosing Phys: Jenkins Rouge MD  Sonographer Comments: 80m Edwards [id number] Implanted PROCEDURE: After discussion of the risks and benefits of a TEE, an informed consent was obtained from the patient. The transesophogeal probe was passed without difficulty through the esophogus of the patient. Sedation performed by different physician. The patient developed no complications during the procedure. IMPRESSIONS  1. Left ventricular ejection fraction, by estimation, is 60 to 65%. The left ventricle has normal function. There is mild left ventricular hypertrophy.  2. Right ventricular systolic function is normal. The right ventricular size is normal.  3. Left atrial size was mildly dilated. No left atrial/left atrial appendage thrombus was detected.   4. The pericardial effusion is anterior to the right ventricle.  5. The mitral valve is abnormal. Mild mitral valve regurgitation.  6. Pre TAVR: tri leaflet calcified mild AR severe AS mean gradient 27 mmHg peak 43 mmHg partially fused right and non coronary cusps         Post TAVR Well placed 23 mm Sapien 3 valve trival PVL 4:00 on PSSA views mean gradient 6 peak 12 mmHg AVA 1.5 cm2. The aortic valve has been repaired/replaced. Aortic valve regurgitation is trivial. FINDINGS  Left Ventricle: Left ventricular ejection fraction, by estimation, is 60 to 65%. The left ventricle has normal function. The left ventricular internal cavity size was normal in size. There is mild left ventricular hypertrophy. Right Ventricle: The right ventricular size is normal. No increase in right ventricular wall thickness. Right ventricular systolic function is normal. Left Atrium: Left atrial size was mildly dilated. No left atrial/left atrial appendage thrombus was detected. Right Atrium: Right atrial size was normal in size. Pericardium: Trivial pericardial effusion is present. The pericardial effusion is anterior to the right ventricle. Mitral Valve: The mitral valve is abnormal. There is mild thickening of the mitral valve leaflet(s). Mild mitral valve regurgitation. Tricuspid Valve: The tricuspid valve is normal in structure. Tricuspid valve regurgitation is mild. Aortic Valve: Pre TAVR: tri leaflet calcified mild AR severe AS mean gradient 27 mmHg peak 43 mmHg partially fused right and non coronary cusps Post TAVR Well placed 23 mm Sapien 3 valve trival PVL 4:00 on PSSA views mean gradient 6 peak 12 mmHg AVA 1.5 cm2. The aortic valve has been repaired/replaced. Aortic valve regurgitation is  trivial. Pulmonic Valve: The pulmonic valve was normal in structure. Pulmonic valve regurgitation is mild. Aorta: The aortic root is normal in size and structure. IAS/Shunts: No atrial level shunt detected by color flow Doppler. Jenkins Rouge MD  Electronically signed by Jenkins Rouge MD Signature Date/Time: 10/13/2021/2:36:14 PM    Final    Structural Heart Procedure  Result Date: 10/13/2021 See surgical note for result.   Disposition   Pt is being discharged home today in good condition.  Follow-up Plans & Appointments     Follow-up Information     Eileen Stanford, PA-C. Go on 10/22/2021.   Specialties: Cardiology, Radiology Why: @ 10:05 am, please arrive at least 10 minutes early. Contact information: [address] [place]  [phone] [phone]                  Discharge Medications   Allergies as of 10/14/2021       Reactions   Lidocaine    Seizure with biopsy with lidocaine this year   Promethazine Hcl    Other reaction(s): stomach upset        Medication List     TAKE these medications    albuterol 108 (90 Base) MCG/ACT inhaler Commonly known as: VENTOLIN HFA Inhale 1-2 puffs into the lungs daily as needed for wheezing or shortness of breath.   aspirin 81 MG chewable tablet Chew 81 mg by mouth daily.   atorvastatin 80 MG tablet Commonly known as: LIPITOR TAKE 1 TABLET BY MOUTH EVERY DAY   clotrimazole-betamethasone cream Commonly known as: LOTRISONE Apply 1 application. topically 2 (two) times daily.   fenofibrate 145 MG tablet Commonly known as: TRICOR TAKE 1 TABLET BY MOUTH EVERY DAY   GARLIQUE PO Take 1 capsule by mouth daily.   icosapent Ethyl 1 g capsule Commonly known as: VASCEPA Take 2 capsules (2 g total) by mouth 2 (two) times daily.   levocetirizine 5 MG tablet Commonly known as: XYZAL Take 5 mg by mouth every evening.   metoprolol tartrate 50 MG tablet Commonly known as: LOPRESSOR Take 50 mg by mouth 2 (two) times daily.   multivitamin with minerals Tabs tablet Take 1 tablet by mouth daily.   Nexlizet 180-10 MG Tabs Generic drug: Bempedoic Acid-Ezetimibe TAKE 1 TABLET BY MOUTH DAILY. PLEASE KEEP UPCOMING WITH DR. Radford Pax ON 08/19/21    nitroGLYCERIN 0.4 MG SL tablet Commonly known as: NITROSTAT Place 1 tablet (0.4 mg total) under the tongue every 5 (five) minutes as needed for chest pain.          Outstanding Labs/Studies   none  Duration of Discharge Encounter   Greater than 30 minutes including physician time.  Mable Fill, PA-C 10/14/2021, 10:04 AM [phone]   ATTENDING ATTESTATION:  After conducting a review of all available clinical information with the care team, interviewing the patient, and performing a physical exam, I agree with the findings and plan described in this note.   GEN: No acute distress.   HEENT:  MMM, no JVD, no scleral icterus, clean dry and intact left neck incision Cardiac: RRR, no murmurs, rubs, or gallops.  Respiratory: Clear to auscultation bilaterally. GI: Soft, nontender, non-distended  MS: No edema; No deformity.  Groin stable Neuro:  Nonfocal  Vasc:  +2 radial pulses  Patient doing well after uncomplicated left transcarotid TAVR with a 23 S3 valve.  Continue aspirin.  Discharge today with 1 week follow-up and cardiac rehabilitation referral.  Lenna Sciara, MD Pager [phone]

## 2021-10-15 ENCOUNTER — Telehealth: Payer: Self-pay

## 2021-10-15 NOTE — Telephone Encounter (Signed)
Patient contacted regarding discharge from [facility] on 10/14/2021.  Patient understands to follow up with provider Nell Range PA-C on 10/22/2021 at 10:05 AM at [facility] office. Patient understands discharge instructions? yes Patient understands medications and regiment? yes Patient understands to bring all medications to this visit? Yes  The pt is doing well at this time and did not have any questions or concerns.

## 2021-10-16 ENCOUNTER — Encounter (HOSPITAL_COMMUNITY): Payer: Self-pay | Admitting: Internal Medicine

## 2021-10-18 MED FILL — Heparin Sodium (Porcine) Inj 1000 Unit/ML: Qty: 1000 | Status: AC

## 2021-10-18 MED FILL — Magnesium Sulfate Inj 50%: INTRAMUSCULAR | Qty: 10 | Status: AC

## 2021-10-18 MED FILL — Potassium Chloride Inj 2 mEq/ML: INTRAVENOUS | Qty: 20 | Status: AC

## 2021-10-21 DIAGNOSIS — N35011 Post-traumatic bulbous urethral stricture: Secondary | ICD-10-CM | POA: Diagnosis not present

## 2021-10-21 DIAGNOSIS — R3914 Feeling of incomplete bladder emptying: Secondary | ICD-10-CM | POA: Diagnosis not present

## 2021-10-22 ENCOUNTER — Ambulatory Visit: Payer: Medicare Other | Admitting: Physician Assistant

## 2021-10-22 ENCOUNTER — Encounter: Payer: Self-pay | Admitting: Physician Assistant

## 2021-10-22 VITALS — BP 112/56 | HR 64 | Ht 67.0 in | Wt 159.0 lb

## 2021-10-22 DIAGNOSIS — Z951 Presence of aortocoronary bypass graft: Secondary | ICD-10-CM | POA: Diagnosis not present

## 2021-10-22 DIAGNOSIS — I5032 Chronic diastolic (congestive) heart failure: Secondary | ICD-10-CM | POA: Diagnosis not present

## 2021-10-22 DIAGNOSIS — E7800 Pure hypercholesterolemia, unspecified: Secondary | ICD-10-CM | POA: Diagnosis not present

## 2021-10-22 DIAGNOSIS — Z952 Presence of prosthetic heart valve: Secondary | ICD-10-CM

## 2021-10-22 MED ORDER — AMOXICILLIN 500 MG PO TABS: 2000.0000 mg | ORAL_TABLET | ORAL | 12 refills | Status: DC

## 2021-10-22 NOTE — Patient Instructions (Signed)
Medication Instructions:  Start Amoxicillin 500 mg, take 4 tablets by mouth 1 hour before dental procedures and cleanings   *If you need a refill on your cardiac medications before your next appointment, please call your pharmacy*   Lab Work: None ordered   If you have labs (blood work) drawn today and your tests are completely normal, you will receive your results only by: MyChart Message (if you have MyChart) OR A paper copy in the mail If you have any lab test that is abnormal or we need to change your treatment, we will call you to review the results.   Testing/Procedures: None ordered    Follow-Up: Follow up as scheduled   Other Instructions   Important Information About Sugar       

## 2021-10-22 NOTE — Progress Notes (Signed)
[facility] [place]                                     Cardiology Office Note:    Date:  10/22/2021   ID:  Craig Guess., DOB 11/15/1950, MRN [id number]  PCP:  Shirline Frees, MD  [facility] Cardiologist:  Fransico Him, MD  / Dr. Ali Lowe & Dr. Cyndia Bent (TAVR) [facility] Electrophysiologist:  None   Referring MD: Shirline Frees, MD   [facility] s/p TAVR  History of Present Illness:    Melvin Dunn. is a 71 y.o. male with a hx of CAD s/p CABG x4 (CHO 2015), HLD, DM2, HTN, chronic diastolic CHF, prostate cancer and severe aortic stenosis s/p TAVR (10/13/21) who presents to clinic for follow up.  He had an echocardiogram in February 2022 showing a mean gradient of 27 mmHg across aortic valve with a valve area of 1.28 cm consistent with moderate aortic stenosis.  Left ventricular ejection fraction was 65 to 70%.  He then developed exertional shortness of breath and fatigue. His most recent echocardiogram on 07/03/2021 showed EF 65-70%, mean grad 37mHg, peak grad 85.768mg, AVA 0.67cm2, DVI 0.24, SVI 36, mild AI. L/[facility]/17/23 showed a patent LIMA to LAD and vein graft to second obtuse marginal. The sequential vein graft to the right coronary artery and R PLV branch had serial high-grade stenoses but medical therapy was recommended given lack of angina.   He was evaluated by the multidisciplinary valve team and underwent  successful TAVR with a 23 mm Edwards Sapien 3 Ultra Resilia 23 THV via the left carotid approach on 10/13/21. Carotid approach was chosen given plaque in the aorta on pre TAVR CTS and concern for potential emboli. Post operative echo  showed EF 65-70%, normally functioning TAVR with a mean gradient of 16 mmHg and mild AR. He was discharged home on a baby aspirin.  Today the patient presents to clinic for follow up. He was feeling some better since TAVR but not having as much energy. He is also just coming off anti testosterone  blocking meds for prostate cancer and hopes this will improve his energy. Thinks smoke in air might be getting to him. No CP or SOB. No LE edema, orthopnea or PND. No dizziness or syncope. No blood in stool or urine. No palpitations.    Past Medical History:  Diagnosis Date   Asthma    no current inhaler use   CAD (coronary artery disease), native coronary artery    LIMA to LAD, SVG to OM1, SVG to RCA-RPL, EVH via right thigh and leg   Colon polyp    Environmental allergies    History of kidney stones    Hyperlipidemia    Pre-diabetes    [place]   [place], of the prostate, 5% of single core 01/2011-watching and waiting for now   S/P TAVR (transcatheter aortic valve replacement) 10/13/2021   s/p TAVR with a 2318mdwards S3UR via the left carotid approach by Dr. ThuAli LoweDr. BarCyndia BentSeizure (HCVa [facility]  due to lidocaine allergy with prostate biopsy this year   Severe aortic stenosis     Past Surgical History:  Procedure Laterality Date   CORONARY ARTERY BYPASS GRAFT N/A 02/13/2014   Procedure: CORONARY ARTERY BYPASS GRAFTING (CABG) TIMES FOUR USING LEFT INTERNAL MAMMARY ARTERY AND RIGHT  SAPHENOUS LEG VEIN HARVESTED ENDOSCOPICALLY;  Surgeon: Rexene Alberts, MD;  Location: [place];  Service: Open Heart Surgery;  Laterality: N/A;   CYSTOSCOPY WITH LITHOLAPAXY N/A 03/17/2020   Procedure: CYSTOSCOPY WITH LITHOLAPAXY;  Surgeon: Raynelle Bring, MD;  Location: [facility];  Service: Urology;  Laterality: N/A;   cytoscopy  15 yrs ago   [place] LITHOTRIPSY  15 yrs ago   EYE SURGERY Bilateral 2015   cataracts   GOLD SEED IMPLANT N/A 03/17/2020   Procedure: GOLD SEED IMPLANT;  Surgeon: Raynelle Bring, MD;  Location: [facility];  Service: Urology;  Laterality: N/A;   INTRAOPERATIVE TRANSESOPHAGEAL ECHOCARDIOGRAM N/A 02/13/2014   Procedure: INTRAOPERATIVE TRANSESOPHAGEAL ECHOCARDIOGRAM;  Surgeon: Rexene Alberts, MD;  Location: [place];  Service: Open Heart Surgery;  Laterality: N/A;   INTRAOPERATIVE TRANSESOPHAGEAL ECHOCARDIOGRAM N/A 10/13/2021   Procedure: INTRAOPERATIVE TRANSESOPHAGEAL ECHOCARDIOGRAM;  Surgeon: Early Osmond, MD;  Location[facility] OR;  Service: Open Heart Surgery;  Laterality: N/A;   LEFT HEART CATHETERIZATION WITH CORONARY ANGIOGRAM N/A 02/12/2014   Procedure: LEFT HEART CATHETERIZATION WITH CORONARY ANGIOGRAM;  Surgeon: Blane Ohara, MD;  Location: [facility] CATH LAB;  Service: Cardiovascular;  Laterality: N/A;   left leg surgery for fracture  2004   plates inserted   PROSTATE BIOPSY  06/2019   RIGHT/LEFT HEART CATH AND CORONARY/GRAFT ANGIOGRAPHY N/A 07/31/2021   Procedure: RIGHT/LEFT HEART CATH AND CORONARY/GRAFT ANGIOGRAPHY;  Surgeon: Early Osmond, MD;  Location: [place] CV LAB;  Service: Cardiovascular;  Laterality: N/A;   SPACE OAR INSTILLATION N/A 03/17/2020   Procedure: SPACE OAR INSTILLATION;  Surgeon: Raynelle Bring, MD;  [facility];  Service: Urology;  Laterality: N/A;   TRANSCATHETER AORTIC VALVE REPLACEMENT, CAROTID Left 10/13/2021   Procedure: Transcatheter Aortic Valve Replacement-Carotid;  Surgeon: Early Osmond, MD;  Location: [place];  Service: Open Heart Surgery;  Laterality: Left;   ULTRASOUND GUIDANCE FOR VASCULAR ACCESS Right 10/13/2021   Procedure: ULTRASOUND GUIDANCE FOR VASCULAR ACCESS;  Surgeon: Early Osmond, MD;  Location: [place];  Service: Open Heart Surgery;  Laterality: Right;    Current Medications: Current Meds  Medication Sig   albuterol (VENTOLIN HFA) 108 (90 Base) MCG/ACT inhaler Inhale 1-2 puffs into the lungs daily as needed for wheezing or shortness of breath.   amoxicillin (AMOXIL) 500 MG tablet Take 4 tablets (2,000 mg total) by mouth as directed. 1 hour prior to dental work including cleanings   aspirin 81 MG chewable tablet Chew 81 mg by mouth daily.   atorvastatin (LIPITOR) 80 MG tablet TAKE 1 TABLET BY MOUTH EVERY DAY    clotrimazole-betamethasone (LOTRISONE) cream Apply 1 application. topically 2 (two) times daily.   fenofibrate (TRICOR) 145 MG tablet TAKE 1 TABLET BY MOUTH EVERY DAY   Garlic (GARLIQUE PO) Take 1 capsule by mouth daily.   icosapent Ethyl (VASCEPA) 1 g capsule Take 2 capsules (2 g total) by mouth 2 (two) times daily.   levocetirizine (XYZAL) 5 MG tablet Take 5 mg by mouth every evening.   metoprolol tartrate (LOPRESSOR) 50 MG tablet Take 50 mg by mouth 2 (two) times daily.   Multiple Vitamin (MULTIVITAMIN WITH MINERALS) TABS tablet Take 1 tablet by mouth daily.   NEXLIZET 180-10 MG TABS TAKE 1 TABLET BY MOUTH DAILY. PLEASE KEEP UPCOMING WITH DR. Radford Pax ON 08/19/21   nitroGLYCERIN (NITROSTAT) 0.4 MG SL tablet Place 1 tablet (0.4 mg total) under the tongue every 5 (five) minutes as needed for chest  pain.     Allergies:   Lidocaine and Promethazine hcl   Social History   Socioeconomic History   Marital status: Single    Spouse name: Not on file   Number of children: Not on file   Years of education: Not on file   Highest education level: Not on file  Occupational History    Comment: full time  Tobacco Use   Smoking status: Never   Smokeless tobacco: Never  Vaping Use   Vaping Use: Never used  Substance and Sexual Activity   Alcohol use: No   Drug use: No   Sexual activity: Yes  Other Topics Concern   Not on file  Social History Narrative   Not on file   Social Determinants of Health   Financial Resource Strain: Not on file  Food Insecurity: Not on file  Transportation Needs: Not on file  Physical Activity: Not on file  Stress: Not on file  Social Connections: Not on file     Family History: The patient's family history includes Alzheimer's disease in his mother; Cancer in his father; Cancer - Lung in his father; Heart attack in his maternal grandfather; Skin cancer in his mother; Stroke in his mother. There is no history of Breast cancer, Colon cancer, Prostate cancer, or  Pancreatic cancer.  ROS:   Please see the history of present illness.    All other systems reviewed and are negative.  EKGs/Labs/Other Studies Reviewed:    The following studies were reviewed today:  TAVR OPERATIVE NOTE     Date of Procedure:                10/13/2021   Preoperative Diagnosis:      Severe Aortic Stenosis    Postoperative Diagnosis:    Same    Procedure:        Transcatheter Aortic Valve Replacement - Left Common Carotid Artery Approach            Edwards Sapien 3 Ultra Resilia THV (size 23 mm, model # 9755RSL, serial # [id number])           Co-Surgeons:                        Gaye Pollack, MD and Lenna Sciara, MD   Anesthesiologist:                  Perfecto Kingdom, MD   Echocardiographer:              Edmonia James, MD   Pre-operative Echo Findings: Severe aortic stenosis Normal left ventricular systolic function   Post-operative Echo Findings: Trivial paravalvular leak Normal left ventricular systolic function   _____________     Echo 10/13/21:  IMPRESSIONS   1. Left ventricular ejection fraction, by estimation, is 65 to 70%. The  left ventricle has normal function. The left ventricle has no regional  wall motion abnormalities. There is mild concentric left ventricular  hypertrophy. Left ventricular diastolic  parameters are consistent with Grade II diastolic dysfunction  (pseudonormalization). Elevated left ventricular end-diastolic pressure.   2. Right ventricular systolic function is normal. The right ventricular  size is normal. There is mildly elevated pulmonary artery systolic  pressure.   3. Left atrial size was mildly dilated.   4. The mitral valve is normal in structure. Trivial mitral valve  regurgitation. No evidence of mitral stenosis.   5. The aortic valve has been repaired/replaced. Aortic valve  regurgitation is  mild. No aortic stenosis is present. There is a 23 mm  Sapien prosthetic (TAVR) valve present in the aortic position. Echo   findings are consistent with normal structure and  function of the aortic valve prosthesis. Aortic valve area, by VTI  measures 1.74 cm. Aortic valve mean gradient measures 16.0 mmHg. Aortic  valve Vmax measures 2.66 m/s.   6. The inferior vena cava is normal in size with greater than 50%  respiratory variability, suggesting right atrial pressure of 3 mmHg.   EKG:  EKG is ordered today.  The ekg ordered today demonstrates sinus HR 64  Recent Labs: 10/09/2021: ALT 32 10/14/2021: BUN 21; Creatinine, Ser 1.18; Hemoglobin 10.9; Magnesium 1.7; Platelets 255; Potassium 4.0; Sodium 136  Recent Lipid Panel    Component Value Date/Time   CHOL 96 (L) 08/25/2021 0718   TRIG 243 (H) 08/25/2021 0718   HDL 6 (L) 08/25/2021 0718   CHOLHDL 16.0 (H) 08/25/2021 0718   CHOLHDL 3.6 05/03/2016 0909   VLDL 18 05/03/2016 0909   LDLCALC 51 08/25/2021 0718     Risk Assessment/Calculations:       Physical Exam:    VS:  BP (!) 112/56   Pulse 64   Ht '5\' 7"'$  (1.702 m)   Wt 159 lb (72.1 kg)   SpO2 97%   BMI 24.90 kg/m     Wt Readings from Last 3 Encounters:  10/22/21 159 lb (72.1 kg)  10/14/21 155 lb 3.3 oz (70.4 kg)  10/09/21 157 lb 12.8 oz (71.6 kg)     GEN:  Well nourished, well developed in no acute distress HEENT: Normal NECK: No JVD LYMPHATICS: No lymphadenopathy CARDIAC: RRR, no murmurs, rubs, gallops RESPIRATORY:  Clear to auscultation without rales, wheezing or rhonchi  ABDOMEN: Soft, non-tender, non-distended MUSCULOSKELETAL:  No edema; No deformity  SKIN: Warm and dry.  Groin/carotid sites clear without hematoma or ecchymosis  NEUROLOGIC:  Alert and oriented x 3 PSYCHIATRIC:  Normal affect   ASSESSMENT:    1. S/P TAVR (transcatheter aortic valve replacement)   2. S/P CABG x 4   3. Pure hypercholesterolemia   4. Chronic diastolic heart failure (HCC)    PLAN:    In order of problems listed above:  Severe AS s/p TAVR: doing okay today. Groin/carotid sites healing well.  ECG with no HAVB. Continue on aspirin. SBE prophylaxis discussed; I have RX'd amoxicillin. I will see him back in 1 month for echo and OV.   CAD s/p CABG: [facility] 07/31/21 showed a patent LIMA to LAD and vein graft to second obtuse marginal. The sequential vein graft to the right coronary artery and R PLV branch had serial high-grade stenoses but medical therapy was recommended given lack of angina. Continue aspirin, BB and high intensity statin    HLD: continue statin, Nexlizet, and fenofibrate.   Chronic diastolic CHF: appears euvolemic off diuretics.      Medication Adjustments/Labs and Tests Ordered: Current medicines are reviewed at length with the patient today.  Concerns regarding medicines are outlined above.  Orders Placed This Encounter  Procedures   EKG 12-Lead   Meds ordered this encounter  Medications   amoxicillin (AMOXIL) 500 MG tablet    Sig: Take 4 tablets (2,000 mg total) by mouth as directed. 1 hour prior to dental work including cleanings    Dispense:  12 tablet    Refill:  12    Order Specific Question:   Supervising Provider    Answer:   Burt Knack, [place]  Patient Instructions  Medication Instructions:  Start Amoxicillin 500 mg, take 4 tablets by mouth 1 hour before dental procedures and cleanings   *If you need a refill on your cardiac medications before your next appointment, please call your pharmacy*   Lab Work: None ordered   If you have labs (blood work) drawn today and your tests are completely normal, you will receive your results only by: [place] (if you have MyChart) OR A paper copy in the mail If you have any lab test that is abnormal or we need to change your treatment, we will call you to review the results.   Testing/Procedures: None ordered    Follow-Up: Follow up as scheduled    Other Instructions   Important Information About Sugar         Signed, Angelena Form, PA-C  10/22/2021 10:32 AM    [place]

## 2021-11-13 ENCOUNTER — Ambulatory Visit: Payer: Medicare Other | Admitting: Physician Assistant

## 2021-11-13 ENCOUNTER — Ambulatory Visit (HOSPITAL_COMMUNITY): Payer: Medicare Other | Attending: Cardiology

## 2021-11-13 VITALS — BP 146/70 | HR 56 | Ht 67.0 in | Wt 159.4 lb

## 2021-11-13 DIAGNOSIS — I5032 Chronic diastolic (congestive) heart failure: Secondary | ICD-10-CM | POA: Diagnosis not present

## 2021-11-13 DIAGNOSIS — Z951 Presence of aortocoronary bypass graft: Secondary | ICD-10-CM | POA: Diagnosis not present

## 2021-11-13 DIAGNOSIS — Z952 Presence of prosthetic heart valve: Secondary | ICD-10-CM

## 2021-11-13 DIAGNOSIS — I152 Hypertension secondary to endocrine disorders: Secondary | ICD-10-CM

## 2021-11-13 DIAGNOSIS — E1159 Type 2 diabetes mellitus with other circulatory complications: Secondary | ICD-10-CM | POA: Diagnosis not present

## 2021-11-13 DIAGNOSIS — E7800 Pure hypercholesterolemia, unspecified: Secondary | ICD-10-CM

## 2021-11-13 LAB — ECHOCARDIOGRAM COMPLETE
AV Mean grad: 10.5 mmHg
AV Peak grad: 20.9 mmHg
Ao pk vel: 2.29 m/s
Area-P 1/2: 3.77 cm2
S' Lateral: 2.9 cm

## 2021-11-13 MED ORDER — FUROSEMIDE 20 MG PO TABS: 20.0000 mg | ORAL_TABLET | Freq: Every day | ORAL | 3 refills | Status: DC

## 2021-11-13 NOTE — Progress Notes (Signed)
[facility] [place]                                     Cardiology Office Note:    Date:  11/13/2021   ID:  Melvin Guess., DOB 04/01/1951, MRN [id number]  PCP:  Shirline Frees, MD  [facility] Cardiologist:  Fransico Him, MD  / Dr. Ali Lowe & Dr. Cyndia Bent (TAVR) [facility] Electrophysiologist:  None   Referring MD: Shirline Frees, MD   1 month s/p TAVR  History of Present Illness:    Melvin Stenzel. is a 71 y.o. male with a hx of CAD s/p CABG x4 (CHO 2015), HLD, DM2, HTN, chronic diastolic CHF, prostate cancer and severe aortic stenosis s/p TAVR (10/13/21) who presents to clinic for follow up.  He had an echocardiogram in February 2022 showing a mean gradient of 27 mmHg across aortic valve with a valve area of 1.28 cm consistent with moderate aortic stenosis.  Left ventricular ejection fraction was 65 to 70%.  He then developed exertional shortness of breath and fatigue. His most recent echocardiogram on 07/03/2021 showed EF 65-70%, mean grad 27mHg, peak grad 85.741mg, AVA 0.67cm2, DVI 0.24, SVI 36, mild AI. L/[facility]/17/23 showed a patent LIMA to LAD and vein graft to second obtuse marginal. The sequential vein graft to the right coronary artery and R PLV branch had serial high-grade stenoses but medical therapy was recommended given lack of angina.   He was evaluated by the multidisciplinary valve team and underwent  successful TAVR with a 23 mm Edwards Sapien 3 Ultra Resilia 23 THV via the left carotid approach on 10/13/21. Carotid approach was chosen given plaque in the aorta on pre TAVR CTS and concern for potential emboli. Post operative echo  showed EF 65-70%, normally functioning TAVR with a mean gradient of 16 mmHg and mild AR. He was discharged home on a baby aspirin.  Today the patient presents to clinic for follow up. He is here alone. Looking forward to a vacation to an amusement park soon. No CP. He does have  SOB, mostly with  exertion. Sometimes coughs when he lies flat. Weight has been relatively stable. No LE edema, orthopnea or PND. No dizziness or syncope. No blood in stool or urine. No palpitations.     Past Medical History:  Diagnosis Date   Asthma    no current inhaler use   CAD (coronary artery disease), native coronary artery    LIMA to LAD, SVG to OM1, SVG to RCA-RPL, EVH via right thigh and leg   Colon polyp    Environmental allergies    History of kidney stones    Hyperlipidemia    Pre-diabetes    [place]   adenoc[place]im[place], of the prostate, 5% of single core 01/2011-watching and waiting for now   S/P TAVR (transcatheter aortic valve replacement) 10/13/2021   s/p TAVR with a 2334mdwards S3UR via the left carotid approach by Dr. ThuAli LoweDr. BarCyndia BentSeizure [facility]  due to lidocaine allergy with prostate biopsy this year   Severe aortic stenosis     Past Surgical History:  Procedure Laterality Date   CORONARY ARTERY BYPASS GRAFT N/A 02/13/2014   Procedure: CORONARY ARTERY BYPASS GRAFTING (CABG) TIMES FOUR USING LEFT INTERNAL MAMMARY ARTERY AND RIGHT SAPHENOUS LEG VEIN HARVESTED ENDOSCOPICALLY;  Surgeon: ClaValentina Gu  Roxy Manns, MD;  Location: [place];  Service: Open Heart Surgery;  Laterality: N/A;   CYSTOSCOPY WITH LITHOLAPAXY N/A 03/17/2020   Procedure: CYSTOSCOPY WITH LITHOLAPAXY;  Surgeon: Raynelle Bring, MD;  Location: [facility];  Service: Urology;  Laterality: N/A;   cytoscopy  15 yrs ago   [place] LITHOTRIPSY  15 yrs ago   EYE SURGERY Bilateral 2015   cataracts   GOLD SEED IMPLANT N/A 03/17/2020   Procedure: GOLD SEED IMPLANT;  Surgeon: Raynelle Bring, MD;  Location: [facility];  Service: Urology;  Laterality: N/A;   INTRAOPERATIVE TRANSESOPHAGEAL ECHOCARDIOGRAM N/A 02/13/2014   Procedure: INTRAOPERATIVE TRANSESOPHAGEAL ECHOCARDIOGRAM;  Surgeon: Rexene Alberts, MD;  Location: [place];  Service: Open Heart Surgery;  Laterality: N/A;    INTRAOPERATIVE TRANSESOPHAGEAL ECHOCARDIOGRAM N/A 10/13/2021   Procedure: INTRAOPERATIVE TRANSESOPHAGEAL ECHOCARDIOGRAM;  Surgeon: Early Osmond, MD;  Location[facility] OR;  Service: Open Heart Surgery;  Laterality: N/A;   LEFT HEART CATHETERIZATION WITH CORONARY ANGIOGRAM N/A 02/12/2014   Procedure: LEFT HEART CATHETERIZATION WITH CORONARY ANGIOGRAM;  Surgeon: Blane Ohara, MD;  Location: [facility] CATH LAB;  Service: Cardiovascular;  Laterality: N/A;   left leg surgery for fracture  2004   plates inserted   PROSTATE BIOPSY  06/2019   RIGHT/LEFT HEART CATH AND CORONARY/GRAFT ANGIOGRAPHY N/A 07/31/2021   Procedure: RIGHT/LEFT HEART CATH AND CORONARY/GRAFT ANGIOGRAPHY;  Surgeon: Early Osmond, MD;  Location: [place] CV LAB;  Service: Cardiovascular;  Laterality: N/A;   SPACE OAR INSTILLATION N/A 03/17/2020   Procedure: SPACE OAR INSTILLATION;  Surgeon: Raynelle Bring, MD;  Location: [facility];  Service: Urology;  Laterality: N/A;   TRANSCATHETER AORTIC VALVE REPLACEMENT, CAROTID Left 10/13/2021   Procedure: Transcatheter Aortic Valve Replacement-Carotid;  Surgeon: Early Osmond, MD;  Location: [place];  Service: Open Heart Surgery;  Laterality: Left;   ULTRASOUND GUIDANCE FOR VASCULAR ACCESS Right 10/13/2021   Procedure: ULTRASOUND GUIDANCE FOR VASCULAR ACCESS;  Surgeon: Early Osmond, MD;  Location: [place];  Service: Open Heart Surgery;  Laterality: Right;    Current Medications: Current Meds  Medication Sig   albuterol (VENTOLIN HFA) 108 (90 Base) MCG/ACT inhaler Inhale 1-2 puffs into the lungs daily as needed for wheezing or shortness of breath.   amoxicillin (AMOXIL) 500 MG tablet Take 4 tablets (2,000 mg total) by mouth as directed. 1 hour prior to dental work including cleanings   aspirin 81 MG chewable tablet Chew 81 mg by mouth daily.   atorvastatin (LIPITOR) 80 MG tablet TAKE 1 TABLET BY MOUTH EVERY DAY   clotrimazole-betamethasone (LOTRISONE) cream Apply 1  application. topically 2 (two) times daily.   fenofibrate (TRICOR) 145 MG tablet TAKE 1 TABLET BY MOUTH EVERY DAY   furosemide (LASIX) 20 MG tablet Take 1 tablet (20 mg total) by mouth daily.   Garlic (GARLIQUE PO) Take 1 capsule by mouth daily.   icosapent Ethyl (VASCEPA) 1 g capsule Take 2 capsules (2 g total) by mouth 2 (two) times daily.   levocetirizine (XYZAL) 5 MG tablet Take 5 mg by mouth every evening.   metoprolol tartrate (LOPRESSOR) 50 MG tablet Take 50 mg by mouth 2 (two) times daily.   Multiple Vitamin (MULTIVITAMIN WITH MINERALS) TABS tablet Take 1 tablet by mouth daily.   NEXLIZET 180-10 MG TABS TAKE 1 TABLET BY MOUTH DAILY. PLEASE KEEP UPCOMING WITH DR. Radford Pax ON 08/19/21   nitroGLYCERIN (NITROSTAT) 0.4 MG SL tablet Place 1 tablet (0.4 mg total) under the tongue every  5 (five) minutes as needed for chest pain.     Allergies:   Lidocaine and Promethazine hcl   Social History   Socioeconomic History   Marital status: Single    Spouse name: Not on file   Number of children: Not on file   Years of education: Not on file   Highest education level: Not on file  Occupational History    Comment: full time  Tobacco Use   Smoking status: Never   Smokeless tobacco: Never  Vaping Use   Vaping Use: Never used  Substance and Sexual Activity   Alcohol use: No   Drug use: No   Sexual activity: Yes  Other Topics Concern   Not on file  Social History Narrative   Not on file   Social Determinants of Health   Financial Resource Strain: Not on file  Food Insecurity: Not on file  Transportation Needs: Not on file  Physical Activity: Not on file  Stress: Not on file  Social Connections: Not on file     Family History: The patient's family history includes Alzheimer's disease in his mother; Cancer in his father; Cancer - Lung in his father; Heart attack in his maternal grandfather; Skin cancer in his mother; Stroke in his mother. There is no history of Breast cancer, Colon  cancer, Prostate cancer, or Pancreatic cancer.  ROS:   Please see the history of present illness.    All other systems reviewed and are negative.  EKGs/Labs/Other Studies Reviewed:    The following studies were reviewed today:  TAVR OPERATIVE NOTE     Date of Procedure:                10/13/2021   Preoperative Diagnosis:      Severe Aortic Stenosis    Postoperative Diagnosis:    Same    Procedure:        Transcatheter Aortic Valve Replacement - Left Common Carotid Artery Approach            Edwards Sapien 3 Ultra Resilia THV (size 23 mm, model # 9755RSL, serial # [id number])           Co-Surgeons:                        Gaye Pollack, MD and Lenna Sciara, MD   Anesthesiologist:                  Perfecto Kingdom, MD   Echocardiographer:              Edmonia James, MD   Pre-operative Echo Findings: Severe aortic stenosis Normal left ventricular systolic function   Post-operative Echo Findings: Trivial paravalvular leak Normal left ventricular systolic function   _____________     Echo 10/13/21:  IMPRESSIONS   1. Left ventricular ejection fraction, by estimation, is 65 to 70%. The  left ventricle has normal function. The left ventricle has no regional  wall motion abnormalities. There is mild concentric left ventricular  hypertrophy. Left ventricular diastolic  parameters are consistent with Grade II diastolic dysfunction  (pseudonormalization). Elevated left ventricular end-diastolic pressure.   2. Right ventricular systolic function is normal. The right ventricular  size is normal. There is mildly elevated pulmonary artery systolic  pressure.   3. Left atrial size was mildly dilated.   4. The mitral valve is normal in structure. Trivial mitral valve  regurgitation. No evidence of mitral stenosis.   5. The aortic valve has  been repaired/replaced. Aortic valve  regurgitation is mild. No aortic stenosis is present. There is a 23 mm  Sapien prosthetic (TAVR) valve present in  the aortic position. Echo  findings are consistent with normal structure and  function of the aortic valve prosthesis. Aortic valve area, by VTI  measures 1.74 cm. Aortic valve mean gradient measures 16.0 mmHg. Aortic  valve Vmax measures 2.66 m/s.   6. The inferior vena cava is normal in size with greater than 50%  respiratory variability, suggesting right atrial pressure of 3 mmHg.   ____________________  Echo 11/13/21 IMPRESSIONS  1. Left ventricular ejection fraction, by estimation, is 60 to 65%. The  left ventricle has normal function. The left ventricle has no regional  wall motion abnormalities. There is mild concentric left ventricular  hypertrophy. Left ventricular diastolic  parameters are indeterminate. Elevated left ventricular end-diastolic  pressure.   2. Right ventricular systolic function is normal. The right ventricular  size is normal. There is normal pulmonary artery systolic pressure. The  estimated right ventricular systolic pressure is 16.1 mmHg.   3. The mitral valve is normal in structure. Trivial mitral valve  regurgitation. No evidence of mitral stenosis.   4. The aortic valve has been repaired/replaced. Perivalvular Aortic valve  regurgitation is trivial. No aortic stenosis is present. Aortic valve mean  gradient measures 10.5 mmHg. Aortic valve Vmax measures 2.28 m/s. DVI  0.46.   5. The inferior vena cava is normal in size with greater than 50%  respiratory variability, suggesting right atrial pressure of 3 mmHg.   EKG:  EKG is NOT ordered today.    Recent Labs: 10/09/2021: ALT 32 10/14/2021: BUN 21; Creatinine, Ser 1.18; Hemoglobin 10.9; Magnesium 1.7; Platelets 255; Potassium 4.0; Sodium 136  Recent Lipid Panel    Component Value Date/Time   CHOL 96 (L) 08/25/2021 0718   TRIG 243 (H) 08/25/2021 0718   HDL 6 (L) 08/25/2021 0718   CHOLHDL 16.0 (H) 08/25/2021 0718   CHOLHDL 3.6 05/03/2016 0909   VLDL 18 05/03/2016 0909   LDLCALC 51 08/25/2021 0718      Risk Assessment/Calculations:       Physical Exam:    VS:  BP (!) 146/70   Pulse (!) 56   Ht '5\' 7"'$  (1.702 m)   Wt 159 lb 6.4 oz (72.3 kg)   SpO2 98%   BMI 24.97 kg/m     Wt Readings from Last 3 Encounters:  11/13/21 159 lb 6.4 oz (72.3 kg)  10/22/21 159 lb (72.1 kg)  10/14/21 155 lb 3.3 oz (70.4 kg)     GEN:  Well nourished, well developed in no acute distress HEENT: Normal NECK: No JVD LYMPHATICS: No lymphadenopathy CARDIAC: RRR, no murmurs, rubs, gallops RESPIRATORY:  Clear to auscultation without rales, wheezing or rhonchi  ABDOMEN: Soft, non-tender, non-distended MUSCULOSKELETAL:  No edema; No deformity  SKIN: Warm and dry.  Groin/carotid sites clear without hematoma or ecchymosis  NEUROLOGIC:  Alert and oriented x 3 PSYCHIATRIC:  Normal affect   ASSESSMENT:    1. S/P TAVR (transcatheter aortic valve replacement)   2. S/P CABG x 4   3. Pure hypercholesterolemia   4. Chronic diastolic heart failure ([place])   5. Hypertension associated with diabetes ([place])     PLAN:    In order of problems listed above:  Severe AS s/p TAVR: echo today shows EF 60%, normally functioning TAVR with a mean gradient of 10.5 mm hg and no PVL. He has NYHA class II symptoms- mostly  of dyspnea. SBE prophylaxis discussed; he has amoxicillin. I will see him back in 1 year for echo and OV.   CAD s/p CABG: [facility] 07/31/21 showed a patent LIMA to LAD and vein graft to second obtuse marginal. The sequential vein graft to the right coronary artery and R PLV branch had serial high-grade stenoses but medical therapy was recommended given lack of angina. Continue aspirin, BB and high intensity statin    HLD: continue statin, Nexlizet, and fenofibrate.   Chronic diastolic CHF: appears euvolemic off diuretics. He does have elevated filling pressures on echo today and dyspnea. Also BP is a little elevated. I am going to trial him on lasix '20mg'$  daily and see if that helps his dyspnea. I will see him  back in 3 weeks with labs.   HTN: BP mildly elevated today. Started on lasix as above.   Medication Adjustments/Labs and Tests Ordered: Current medicines are reviewed at length with the patient today.  Concerns regarding medicines are outlined above.  Orders Placed This Encounter  Procedures   ECHOCARDIOGRAM COMPLETE   Meds ordered this encounter  Medications   furosemide (LASIX) 20 MG tablet    Sig: Take 1 tablet (20 mg total) by mouth daily.    Dispense:  90 tablet    Refill:  3    Patient Instructions  Medication Instructions:  Start Lasix 20 mg daily   *If you need a refill on your cardiac medications before your next appointment, please call your pharmacy*   Lab Work: None ordered   If you have labs (blood work) drawn today and your tests are completely normal, you will receive your results only by: [place] (if you have MyChart) OR A paper copy in the mail If you have any lab test that is abnormal or we need to change your treatment, we will call you to review the results.   Testing/Procedures: Your physician has requested that you have an echocardiogram in May 2024. Echocardiography is a painless test that uses sound waves to create images of your heart. It provides your doctor with information about the size and shape of your heart and how well your heart's chambers and valves are working. This procedure takes approximately one hour. There are no restrictions for this procedure.    Follow-Up: Follow up as scheduled    Other Instructions   Important Information About Sugar         Signed, Angelena Form, PA-C  11/13/2021 11:08 AM    [place]

## 2021-11-13 NOTE — Patient Instructions (Signed)
Medication Instructions:  Start Lasix 20 mg daily   *If you need a refill on your cardiac medications before your next appointment, please call your pharmacy*   Lab Work: None ordered   If you have labs (blood work) drawn today and your tests are completely normal, you will receive your results only by: [place] (if you have MyChart) OR A paper copy in the mail If you have any lab test that is abnormal or we need to change your treatment, we will call you to review the results.   Testing/Procedures: Your physician has requested that you have an echocardiogram in May 2024. Echocardiography is a painless test that uses sound waves to create images of your heart. It provides your doctor with information about the size and shape of your heart and how well your heart's chambers and valves are working. This procedure takes approximately one hour. There are no restrictions for this procedure.    Follow-Up: Follow up as scheduled    Other Instructions   Important Information About Sugar

## 2021-11-21 ENCOUNTER — Other Ambulatory Visit: Payer: Self-pay | Admitting: Cardiology

## 2021-12-08 NOTE — Progress Notes (Unsigned)
HEART AND [place]                                     Cardiology Office Note:    Date:  12/09/2021   ID:  Melvin Guess., DOB 24-May-1950, MRN [id number]  PCP:  Shirline Frees, MD  [facility] Cardiologist:  Fransico Him, MD  / Dr. Ali Lowe & Dr. Cyndia Bent (TAVR) [facility] Electrophysiologist:  None   Referring MD: Shirline Frees, MD   Follow up  History of Present Illness:    Melvin Netz. is a 71 y.o. male with a hx of CAD s/p CABG x4 (CHO 2015), HLD, DM2, HTN, chronic diastolic CHF, prostate cancer and severe aortic stenosis s/p TAVR (10/13/21) who presents to clinic for follow up.  Melvin Dunn developed exertional shortness of breath and fatigue. His most recent echocardiogram on 07/03/2021 showed EF 65-70%, mean grad 27mHg, peak grad 85.74mg, AVA 0.67cm2, DVI 0.24, SVI 36, mild AI. L/[facility]/17/23 showed a patent LIMA to LAD and vein graft to second obtuse marginal. The sequential vein graft to the right coronary artery and R PLV branch had serial high-grade stenoses but medical therapy was recommended given lack of angina.   He was evaluated by the multidisciplinary valve team and underwent  successful TAVR with a 23 mm Edwards Sapien 3 Ultra Resilia 23 THV via the left carotid approach on 10/13/21. Carotid approach was chosen given plaque in the aorta on pre TAVR CTs and concern for potential emboli. Post operative echo  showed EF 65-70%, normally functioning TAVR with a mean gradient of 16 mmHg and mild AR. He was discharged home on a baby aspirin. 1 month echo showed EF 60%, normally functioning TAVR with a mean gradient of 10.5 mm hg and no PVL as well as elevated filling pressures on echo today and dyspnea. I started him on Lasix '20mg'$  daily.   Today the patient presents to clinic for follow up. He is down 5 lbs, BP is improved and breathing better. [facility] and was able to walk around with no  limitation. He does have a chronic cough which he attributes to allergies. No CP or SOB. No LE edema, orthopnea or PND. No dizziness or syncope. No blood in stool or urine. No palpitations.     Past Medical History:  Diagnosis Date   Asthma    no current inhaler use   CAD (coronary artery disease), native coronary artery    LIMA to LAD, SVG to OM1, SVG to RCA-RPL, EVH via right thigh and leg   Colon polyp    Environmental allergies    History of kidney stones    Hyperlipidemia    Pre-diabetes    [place]state cancer ([facility]   adenocarcimona, of the prostate, 5% of single core 01/2011-watching and waiting for now   S/P TAVR (transcatheter aortic valve replacement) 10/13/2021   s/p TAVR with a 2361mdwards S3UR via the left carotid approach by Dr. ThuAli LoweDr. BarCyndia BentSeizure ([facility]  due to lidocaine allergy with prostate biopsy this year   Severe aortic stenosis     Past Surgical History:  Procedure Laterality Date   CORONARY ARTERY BYPASS GRAFT N/A 02/13/2014   Procedure: CORONARY ARTERY BYPASS GRAFTING (CABG) TIMES FOUR USING LEFT INTERNAL MAMMARY ARTERY AND RIGHT SAPHENOUS LEG  VEIN HARVESTED ENDOSCOPICALLY;  Surgeon: Rexene Alberts, MD;  Location: [place];  Service: Open Heart Surgery;  Laterality: N/A;   CYSTOSCOPY WITH LITHOLAPAXY N/A 03/17/2020   Procedure: CYSTOSCOPY WITH LITHOLAPAXY;  Surgeon: Raynelle Bring, MD;  Location[facility];  Service: Urology;  Laterality: N/A;   cytoscopy  15 yrs ago   [place] LITHOTRIPSY  15 yrs ago   EYE SURGERY Bilateral 2015   cataracts   GOLD SEED IMPLANT N/A 03/17/2020   Procedure: GOLD SEED IMPLANT;  Surgeon: Raynelle Bring, MD;  Location: [facility];  Service: Urology;  Laterality: N/A;   INTRAOPERATIVE TRANSESOPHAGEAL ECHOCARDIOGRAM N/A 02/13/2014   Procedure: INTRAOPERATIVE TRANSESOPHAGEAL ECHOCARDIOGRAM;  Surgeon: Rexene Alberts, MD;  Location: [place];  Service: Open Heart Surgery;   Laterality: N/A;   INTRAOPERATIVE TRANSESOPHAGEAL ECHOCARDIOGRAM N/A 10/13/2021   Procedure: INTRAOPERATIVE TRANSESOPHAGEAL ECHOCARDIOGRAM;  Surgeon: Early Osmond, MD;  Location: [facility] OR;  Service: Open Heart Surgery;  Laterality: N/A;   LEFT HEART CATHETERIZATION WITH CORONARY ANGIOGRAM N/A 02/12/2014   Procedure: LEFT HEART CATHETERIZATION WITH CORONARY ANGIOGRAM;  Surgeon: Blane Ohara, MD;  Location: [facility] CATH LAB;  Service: Cardiovascular;  Laterality: N/A;   left leg surgery for fracture  2004   plates inserted   PROSTATE BIOPSY  06/2019   RIGHT/LEFT HEART CATH AND CORONARY/GRAFT ANGIOGRAPHY N/A 07/31/2021   Procedure: RIGHT/LEFT HEART CATH AND CORONARY/GRAFT ANGIOGRAPHY;  Surgeon: Early Osmond, MD;  Location: [place] CV LAB;  Service: Cardiovascular;  Laterality: N/A;   SPACE OAR INSTILLATION N/A 03/17/2020   Procedure: SPACE OAR INSTILLATION;  Surgeon: Raynelle Bring, MD;  Location: [facility];  Service: Urology;  Laterality: N/A;   TRANSCATHETER AORTIC VALVE REPLACEMENT, CAROTID Left 10/13/2021   Procedure: Transcatheter Aortic Valve Replacement-Carotid;  Surgeon: Early Osmond, MD;  Location: [place];  Service: Open Heart Surgery;  Laterality: Left;   ULTRASOUND GUIDANCE FOR VASCULAR ACCESS Right 10/13/2021   Procedure: ULTRASOUND GUIDANCE FOR VASCULAR ACCESS;  Surgeon: Early Osmond, MD;  Location: [place];  Service: Open Heart Surgery;  Laterality: Right;    Current Medications: Current Meds  Medication Sig   albuterol (VENTOLIN HFA) 108 (90 Base) MCG/ACT inhaler Inhale 1-2 puffs into the lungs daily as needed for wheezing or shortness of breath.   amoxicillin (AMOXIL) 500 MG tablet Take 4 tablets (2,000 mg total) by mouth as directed. 1 hour prior to dental work including cleanings   aspirin 81 MG chewable tablet Chew 81 mg by mouth daily.   atorvastatin (LIPITOR) 80 MG tablet TAKE 1 TABLET BY MOUTH EVERY DAY   Bempedoic Acid-Ezetimibe (NEXLIZET) 180-10  MG TABS Take 1 tablet by mouth daily.   clotrimazole-betamethasone (LOTRISONE) cream Apply 1 application. topically 2 (two) times daily.   fenofibrate (TRICOR) 145 MG tablet TAKE 1 TABLET BY MOUTH EVERY DAY   furosemide (LASIX) 20 MG tablet Take 1 tablet (20 mg total) by mouth daily.   Garlic (GARLIQUE PO) Take 1 capsule by mouth daily.   icosapent Ethyl (VASCEPA) 1 g capsule Take 2 capsules (2 g total) by mouth 2 (two) times daily.   levocetirizine (XYZAL) 5 MG tablet Take 5 mg by mouth every evening.   metoprolol tartrate (LOPRESSOR) 50 MG tablet Take 50 mg by mouth 2 (two) times daily.   Multiple Vitamin (MULTIVITAMIN WITH MINERALS) TABS tablet Take 1 tablet by mouth daily.   nitroGLYCERIN (NITROSTAT) 0.4 MG SL tablet Place 1 tablet (0.4 mg total) under the tongue  every 5 (five) minutes as needed for chest pain.     Allergies:   Lidocaine and Promethazine hcl   Social History   Socioeconomic History   Marital status: Single    Spouse name: Not on file   Number of children: Not on file   Years of education: Not on file   Highest education level: Not on file  Occupational History    Comment: full time  Tobacco Use   Smoking status: Never   Smokeless tobacco: Never  Vaping Use   Vaping Use: Never used  Substance and Sexual Activity   Alcohol use: No   Drug use: No   Sexual activity: Yes  Other Topics Concern   Not on file  Social History Narrative   Not on file   Social Determinants of Health   Financial Resource Strain: Not on file  Food Insecurity: Not on file  Transportation Needs: Not on file  Physical Activity: Not on file  Stress: Not on file  Social Connections: Not on file     Family History: The patient's family history includes Alzheimer's disease in his mother; Cancer in his father; Cancer - Lung in his father; Heart attack in his maternal grandfather; Skin cancer in his mother; Stroke in his mother. There is no history of Breast cancer, Colon cancer,  Prostate cancer, or Pancreatic cancer.  ROS:   Please see the history of present illness.    All other systems reviewed and are negative.  EKGs/Labs/Other Studies Reviewed:    The following studies were reviewed today:  TAVR OPERATIVE NOTE     Date of Procedure:                10/13/2021   Preoperative Diagnosis:      Severe Aortic Stenosis    Postoperative Diagnosis:    Same    Procedure:        Transcatheter Aortic Valve Replacement - Left Common Carotid Artery Approach            Edwards Sapien 3 Ultra Resilia THV (size 23 mm, model # 9755RSL, serial # [id number])           Co-Surgeons:                        Gaye Pollack, MD and Lenna Sciara, MD   Anesthesiologist:                  Perfecto Kingdom, MD   Echocardiographer:              Edmonia James, MD   Pre-operative Echo Findings: Severe aortic stenosis Normal left ventricular systolic function   Post-operative Echo Findings: Trivial paravalvular leak Normal left ventricular systolic function   _____________     Echo 10/13/21:  IMPRESSIONS   1. Left ventricular ejection fraction, by estimation, is 65 to 70%. The  left ventricle has normal function. The left ventricle has no regional  wall motion abnormalities. There is mild concentric left ventricular  hypertrophy. Left ventricular diastolic  parameters are consistent with Grade II diastolic dysfunction  (pseudonormalization). Elevated left ventricular end-diastolic pressure.   2. Right ventricular systolic function is normal. The right ventricular  size is normal. There is mildly elevated pulmonary artery systolic  pressure.   3. Left atrial size was mildly dilated.   4. The mitral valve is normal in structure. Trivial mitral valve  regurgitation. No evidence of mitral stenosis.   5. The aortic valve  has been repaired/replaced. Aortic valve  regurgitation is mild. No aortic stenosis is present. There is a 23 mm  Sapien prosthetic (TAVR) valve present in the  aortic position. Echo  findings are consistent with normal structure and  function of the aortic valve prosthesis. Aortic valve area, by VTI  measures 1.74 cm. Aortic valve mean gradient measures 16.0 mmHg. Aortic  valve Vmax measures 2.66 m/s.   6. The inferior vena cava is normal in size with greater than 50%  respiratory variability, suggesting right atrial pressure of 3 mmHg.   ____________________  Echo 11/13/21 IMPRESSIONS  1. Left ventricular ejection fraction, by estimation, is 60 to 65%. The  left ventricle has normal function. The left ventricle has no regional  wall motion abnormalities. There is mild concentric left ventricular  hypertrophy. Left ventricular diastolic  parameters are indeterminate. Elevated left ventricular end-diastolic  pressure.   2. Right ventricular systolic function is normal. The right ventricular  size is normal. There is normal pulmonary artery systolic pressure. The  estimated right ventricular systolic pressure is 38.1 mmHg.   3. The mitral valve is normal in structure. Trivial mitral valve  regurgitation. No evidence of mitral stenosis.   4. The aortic valve has been repaired/replaced. Perivalvular Aortic valve  regurgitation is trivial. No aortic stenosis is present. Aortic valve mean  gradient measures 10.5 mmHg. Aortic valve Vmax measures 2.28 m/s. DVI  0.46.   5. The inferior vena cava is normal in size with greater than 50%  respiratory variability, suggesting right atrial pressure of 3 mmHg.   EKG:  EKG is NOT ordered today.    Recent Labs: 10/09/2021: ALT 32 10/14/2021: BUN 21; Creatinine, Ser 1.18; Hemoglobin 10.9; Magnesium 1.7; Platelets 255; Potassium 4.0; Sodium 136  Recent Lipid Panel    Component Value Date/Time   CHOL 96 (L) 08/25/2021 0718   TRIG 243 (H) 08/25/2021 0718   HDL 6 (L) 08/25/2021 0718   CHOLHDL 16.0 (H) 08/25/2021 0718   CHOLHDL 3.6 05/03/2016 0909   VLDL 18 05/03/2016 0909   LDLCALC 51 08/25/2021 0718      Risk Assessment/Calculations:       Physical Exam:    VS:  BP (!) 120/50 (BP Location: Left Arm, Patient Position: Sitting, Cuff Size: Normal)   Pulse 64   Ht '5\' 7"'$  (1.702 m)   Wt 154 lb (69.9 kg)   SpO2 98%   BMI 24.12 kg/m     Wt Readings from Last 3 Encounters:  12/09/21 154 lb (69.9 kg)  11/13/21 159 lb 6.4 oz (72.3 kg)  10/22/21 159 lb (72.1 kg)     GEN:  Well nourished, well developed in no acute distress HEENT: Normal NECK: No JVD LYMPHATICS: No lymphadenopathy CARDIAC: RRR, no murmurs, rubs, gallops RESPIRATORY:  Clear to auscultation without rales, wheezing or rhonchi  ABDOMEN: Soft, non-tender, non-distended MUSCULOSKELETAL:  No edema; No deformity  SKIN: Warm and dry.  Groin/carotid sites clear without hematoma or ecchymosis  NEUROLOGIC:  Alert and oriented x 3 PSYCHIATRIC:  Normal affect   ASSESSMENT:    1. Dyspnea, unspecified type   2. Chronic diastolic heart failure ([place])   3. Hypertension associated with diabetes ([place])   4. S/P TAVR (transcatheter aortic valve replacement)   5. S/P CABG x 4   6. Pure hypercholesterolemia      PLAN:    In order of problems listed above:  Dyspnea/Chronic diastolic CHF: much improved after starting Lasix '20mg'$  daily. Weight down 5 lbs and BP better.  He will continue on this. Check BMET today  HTN: BP improved and 120/50 today  Severe AS s/p TAVR: stable by recent echo.    CAD s/p CABG: [facility] 07/31/21 showed a patent LIMA to LAD and vein graft to second obtuse marginal. The sequential vein graft to the right coronary artery and R PLV branch had serial high-grade stenoses but medical therapy was recommended given lack of angina. Continue aspirin, BB and high intensity statin    HLD: continue statin, Nexlizet, and fenofibrate.    Medication Adjustments/Labs and Tests Ordered: Current medicines are reviewed at length with the patient today.  Concerns regarding medicines are outlined above.  Orders Placed This  Encounter  Procedures   Basic metabolic panel   No orders of the defined types were placed in this encounter.   Patient Instructions  Medication Instructions:  Your physician recommends that you continue on your current medications as directed. Please refer to the Current Medication list given to you today.   *If you need a refill on your cardiac medications before your next appointment, please call your pharmacy*   Lab Work: Bmp - today   If you have labs (blood work) drawn today and your tests are completely normal, you will receive your results only by: [place] (if you have MyChart) OR A paper copy in the mail If you have any lab test that is abnormal or we need to change your treatment, we will call you to review the results.   Testing/Procedures: None ordered today    Follow-Up: Follow up as scheduled    Other Instructions   Important Information About Sugar         Signed, Angelena Form, PA-C  12/09/2021 3:55 PM    [place]

## 2021-12-09 ENCOUNTER — Ambulatory Visit: Payer: Medicare Other | Admitting: Physician Assistant

## 2021-12-09 VITALS — BP 120/50 | HR 64 | Ht 67.0 in | Wt 154.0 lb

## 2021-12-09 DIAGNOSIS — E1159 Type 2 diabetes mellitus with other circulatory complications: Secondary | ICD-10-CM

## 2021-12-09 DIAGNOSIS — Z951 Presence of aortocoronary bypass graft: Secondary | ICD-10-CM | POA: Diagnosis not present

## 2021-12-09 DIAGNOSIS — I5032 Chronic diastolic (congestive) heart failure: Secondary | ICD-10-CM

## 2021-12-09 DIAGNOSIS — R0600 Dyspnea, unspecified: Secondary | ICD-10-CM

## 2021-12-09 DIAGNOSIS — E7800 Pure hypercholesterolemia, unspecified: Secondary | ICD-10-CM

## 2021-12-09 DIAGNOSIS — I152 Hypertension secondary to endocrine disorders: Secondary | ICD-10-CM | POA: Diagnosis not present

## 2021-12-09 DIAGNOSIS — Z952 Presence of prosthetic heart valve: Secondary | ICD-10-CM | POA: Diagnosis not present

## 2021-12-09 NOTE — Patient Instructions (Signed)
Medication Instructions:  Your physician recommends that you continue on your current medications as directed. Please refer to the Current Medication list given to you today.   *If you need a refill on your cardiac medications before your next appointment, please call your pharmacy*   Lab Work: Bmp - today   If you have labs (blood work) drawn today and your tests are completely normal, you will receive your results only by: [place] (if you have MyChart) OR A paper copy in the mail If you have any lab test that is abnormal or we need to change your treatment, we will call you to review the results.   Testing/Procedures: None ordered today    Follow-Up: Follow up as scheduled    Other Instructions   Important Information About Sugar

## 2021-12-10 LAB — BASIC METABOLIC PANEL
BUN/Creatinine Ratio: 23 (ref 10–24)
BUN: 24 mg/dL (ref 8–27)
CO2: 24 mmol/L (ref 20–29)
Calcium: 10 mg/dL (ref 8.6–10.2)
Chloride: 104 mmol/L (ref 96–106)
Creatinine, Ser: 1.04 mg/dL (ref 0.76–1.27)
Glucose: 134 mg/dL — ABNORMAL HIGH (ref 70–99)
Potassium: 3.8 mmol/L (ref 3.5–5.2)
Sodium: 142 mmol/L (ref 134–144)
eGFR: 77 mL/min/{1.73_m2} (ref >59)

## 2022-02-01 DIAGNOSIS — L309 Dermatitis, unspecified: Secondary | ICD-10-CM | POA: Diagnosis not present

## 2022-02-15 ENCOUNTER — Telehealth: Payer: Self-pay | Admitting: *Deleted

## 2022-02-15 ENCOUNTER — Telehealth: Payer: Self-pay

## 2022-02-15 NOTE — Telephone Encounter (Signed)
Spoke with patient who is agreeable to do a tele visit on 12/15 at 2 pm. Med rec and consent are done.

## 2022-02-15 NOTE — Telephone Encounter (Signed)
  Patient Consent for Virtual Visit        Melvin Dunn. has provided verbal consent on 02/15/2022 for a virtual visit (video or telephone).   CONSENT FOR VIRTUAL VISIT FOR:  Melvin Dunn.  By participating in this virtual visit I agree to the following:  I hereby voluntarily request, consent and authorize [facility] and its employed or contracted physicians, physician assistants, nurse practitioners or other licensed health care professionals (the Practitioner), to provide me with telemedicine health care services (the "Services") as deemed necessary by the treating Practitioner. I acknowledge and consent to receive the Services by the Practitioner via telemedicine. I understand that the telemedicine visit will involve communicating with the Practitioner through live audiovisual communication technology and the disclosure of certain medical information by electronic transmission. I acknowledge that I have been given the opportunity to request an in-person assessment or other available alternative prior to the telemedicine visit and am voluntarily participating in the telemedicine visit.  I understand that I have the right to withhold or withdraw my consent to the use of telemedicine in the course of my care at any time, without affecting my right to future care or treatment, and that the Practitioner or I may terminate the telemedicine visit at any time. I understand that I have the right to inspect all information obtained and/or recorded in the course of the telemedicine visit and may receive copies of available information for a reasonable fee.  I understand that some of the potential risks of receiving the Services via telemedicine include:  Delay or interruption in medical evaluation due to technological equipment failure or disruption; Information transmitted may not be sufficient (e.g. poor resolution of images) to allow for appropriate medical decision making by the  Practitioner; and/or  In rare instances, security protocols could fail, causing a breach of personal health information.  Furthermore, I acknowledge that it is my responsibility to provide information about my medical history, conditions and care that is complete and accurate to the best of my ability. I acknowledge that Practitioner's advice, recommendations, and/or decision may be based on factors not within their control, such as incomplete or inaccurate data provided by me or distortions of diagnostic images or specimens that may result from electronic transmissions. I understand that the practice of medicine is not an exact science and that Practitioner makes no warranties or guarantees regarding treatment outcomes. I acknowledge that a copy of this consent can be made available to me via my patient portal ([facility]), or I can request a printed copy by calling the office of [facility].    I understand that my insurance will be billed for this visit.   I have read or had this consent read to me. I understand the contents of this consent, which adequately explains the benefits and risks of the Services being provided via telemedicine.  I have been provided ample opportunity to ask questions regarding this consent and the Services and have had my questions answered to my satisfaction. I give my informed consent for the services to be provided through the use of telemedicine in my medical care

## 2022-02-15 NOTE — Telephone Encounter (Signed)
   Pre-operative Risk Assessment    Patient Name: Melvin Dunn.  DOB: 1950-08-21 MRN: [id number]      Request for Surgical Clearance    Procedure:   COLONOSCOOPY  Date of Surgery:  Clearance TBD                                 Surgeon:  DR. Wilford Corner Surgeon's Group or Practice Name:  [facility] Phone number:  [phone] Fax number:  [phone]   Type of Clearance Requested:   - Medical  - Pharmacy:  Hold Aspirin NOT INDICATED ON CLEARANCE    Type of Anesthesia:   PROPORFOL   Additional requests/questions:    Astrid Divine   02/15/2022, 1:48 PM

## 2022-02-15 NOTE — Telephone Encounter (Signed)
Primary Cardiologist:Traci Radford Pax, MD   Preoperative team, please contact this patient and set up a phone call appointment for mid to late November for further preoperative risk assessment. Please obtain consent and complete medication review. Thank you for your help.   Request to hold aspirin was not included. Patient has stenosis of sequential vein graft to right coronary artery and R PLV with patent LIMA to LAD and vein graft to second obtuse marginal on cardiac catheterization 07/31/2021,  medical management was recommended. Additionally, he underwent TAVR on 10/13/2021. At time of this procedure, he will be > 6 months. Pending no symptoms of ACS, it would be reasonable for him to hold aspirin for 5 day prior to colonoscopy.    Emmaline Life, NP-C  02/15/2022, 2:37 PM [address] Office [phone] Fax [phone]

## 2022-02-19 DIAGNOSIS — E1169 Type 2 diabetes mellitus with other specified complication: Secondary | ICD-10-CM | POA: Diagnosis not present

## 2022-02-19 DIAGNOSIS — Z952 Presence of prosthetic heart valve: Secondary | ICD-10-CM | POA: Diagnosis not present

## 2022-02-19 DIAGNOSIS — J4520 Mild intermittent asthma, uncomplicated: Secondary | ICD-10-CM | POA: Diagnosis not present

## 2022-02-19 DIAGNOSIS — E7800 Pure hypercholesterolemia, unspecified: Secondary | ICD-10-CM | POA: Diagnosis not present

## 2022-02-19 DIAGNOSIS — Z23 Encounter for immunization: Secondary | ICD-10-CM | POA: Diagnosis not present

## 2022-02-19 DIAGNOSIS — I2510 Atherosclerotic heart disease of native coronary artery without angina pectoris: Secondary | ICD-10-CM | POA: Diagnosis not present

## 2022-02-19 DIAGNOSIS — B079 Viral wart, unspecified: Secondary | ICD-10-CM | POA: Diagnosis not present

## 2022-02-19 DIAGNOSIS — H6123 Impacted cerumen, bilateral: Secondary | ICD-10-CM | POA: Diagnosis not present

## 2022-03-11 ENCOUNTER — Emergency Department (HOSPITAL_COMMUNITY)
Admission: EM | Admit: 2022-03-11 | Discharge: 2022-03-12 | Disposition: A | Discharge status: Home / Self Care | Payer: Medicare Other | Attending: Emergency Medicine | Admitting: Emergency Medicine

## 2022-03-11 ENCOUNTER — Other Ambulatory Visit: Payer: Self-pay

## 2022-03-11 ENCOUNTER — Encounter (HOSPITAL_COMMUNITY): Payer: Self-pay | Admitting: Emergency Medicine

## 2022-03-11 DIAGNOSIS — Z8546 Personal history of malignant neoplasm of prostate: Secondary | ICD-10-CM | POA: Diagnosis not present

## 2022-03-11 DIAGNOSIS — N23 Unspecified renal colic: Secondary | ICD-10-CM | POA: Diagnosis not present

## 2022-03-11 DIAGNOSIS — Z7982 Long term (current) use of aspirin: Secondary | ICD-10-CM | POA: Insufficient documentation

## 2022-03-11 DIAGNOSIS — R109 Unspecified abdominal pain: Secondary | ICD-10-CM | POA: Diagnosis not present

## 2022-03-11 DIAGNOSIS — N134 Hydroureter: Secondary | ICD-10-CM | POA: Diagnosis not present

## 2022-03-11 LAB — URINALYSIS, ROUTINE W REFLEX MICROSCOPIC
Bilirubin Urine: NEGATIVE
Glucose, UA: NEGATIVE mg/dL
Ketones, ur: NEGATIVE mg/dL
Leukocytes,Ua: NEGATIVE
Nitrite: NEGATIVE
Protein, ur: NEGATIVE mg/dL
RBC / HPF: >50 RBC/hpf — ABNORMAL HIGH (ref 0–5)
Specific Gravity, Urine: 1.017 (ref 1.005–1.030)
pH: 5 (ref 5.0–8.0)

## 2022-03-11 LAB — CBC WITH DIFFERENTIAL/PLATELET
Abs Immature Granulocytes: 0.09 10*3/uL — ABNORMAL HIGH (ref 0.00–0.07)
Basophils Absolute: 0.1 10*3/uL (ref 0.0–0.1)
Basophils Relative: 1 %
Eosinophils Absolute: 0.3 10*3/uL (ref 0.0–0.5)
Eosinophils Relative: 2 %
HCT: 38.6 % — ABNORMAL LOW (ref 39.0–52.0)
Hemoglobin: 12.6 g/dL — ABNORMAL LOW (ref 13.0–17.0)
Immature Granulocytes: 1 %
Lymphocytes Relative: 9 %
Lymphs Abs: 1.3 10*3/uL (ref 0.7–4.0)
MCH: 28.4 pg (ref 26.0–34.0)
MCHC: 32.6 g/dL (ref 30.0–36.0)
MCV: 87.1 fL (ref 80.0–100.0)
Monocytes Absolute: 1.1 10*3/uL — ABNORMAL HIGH (ref 0.1–1.0)
Monocytes Relative: 7 %
Neutro Abs: 12.5 10*3/uL — ABNORMAL HIGH (ref 1.7–7.7)
Neutrophils Relative %: 80 %
Platelets: 343 10*3/uL (ref 150–400)
RBC: 4.43 MIL/uL (ref 4.22–5.81)
RDW: 14.5 % (ref 11.5–15.5)
WBC: 15.4 10*3/uL — ABNORMAL HIGH (ref 4.0–10.5)
nRBC: 0 % (ref 0.0–0.2)

## 2022-03-11 LAB — COMPREHENSIVE METABOLIC PANEL
ALT: 35 U/L (ref 0–44)
AST: 47 U/L — ABNORMAL HIGH (ref 15–41)
Albumin: 3.8 g/dL (ref 3.5–5.0)
Alkaline Phosphatase: 58 U/L (ref 38–126)
Anion gap: 14 (ref 5–15)
BUN: 27 mg/dL — ABNORMAL HIGH (ref 8–23)
CO2: 25 mmol/L (ref 22–32)
Calcium: 10.5 mg/dL — ABNORMAL HIGH (ref 8.9–10.3)
Chloride: 103 mmol/L (ref 98–111)
Creatinine, Ser: 1.47 mg/dL — ABNORMAL HIGH (ref 0.61–1.24)
GFR, Estimated: 51 mL/min — ABNORMAL LOW (ref >60)
Glucose, Bld: 172 mg/dL — ABNORMAL HIGH (ref 70–99)
Potassium: 4.4 mmol/L (ref 3.5–5.1)
Sodium: 142 mmol/L (ref 135–145)
Total Bilirubin: 0.8 mg/dL (ref 0.3–1.2)
Total Protein: 6.5 g/dL (ref 6.5–8.1)

## 2022-03-11 LAB — LIPASE, BLOOD: Lipase: 44 U/L (ref 11–51)

## 2022-03-11 MED ORDER — OXYCODONE-ACETAMINOPHEN 5-325 MG PO TABS
1.0000 | ORAL_TABLET | Freq: Once | ORAL | Status: AC
  Administered 2022-03-12: 1 via ORAL
  Filled 2022-03-11: qty 1

## 2022-03-11 NOTE — ED Triage Notes (Signed)
Pt reports right sided flank/abdominal pain. Reports n/v, denies fevers. Pt does report burning with urination.

## 2022-03-11 NOTE — ED Provider Triage Note (Signed)
Emergency Medicine Provider Triage Evaluation Note  Melvin Dunn. , a 71 y.o. male  was evaluated in triage.  Pt complains of right lower abdominal pain onset this morning, worsening around lunch. Pain is constant and waxing/waning. It has started to radiate toward the R flank. No N/V/D, melena, hematochezia, fevers, urinary symptoms, extremity weakness, bowel/bladder incontinence. Denies prior abdominal surgeries.  Review of Systems  Positive: As above Negative: As above  Physical Exam  BP (!) 156/60 (BP Location: Left Arm)   Pulse 72   Temp 98.8 F (37.1 C)   Resp 16   SpO2 98%  Gen:   Awake, no distress   Resp:  Normal effort  MSK:   Moves extremities without difficulty  Other:  Mild RUQ TTP. Negative Murphy's sign. No peritoneal signs.  Medical Decision Making  Medically screening exam initiated at 10:12 PM.  Appropriate orders placed.  Melvin Dunn. was informed that the remainder of the evaluation will be completed by another provider, this initial triage assessment does not replace that evaluation, and the importance of remaining in the ED until their evaluation is complete.  Abdominal pain - hx of kidney stones. No peritoneal signs on abdominal exam. Planning labs, CT.   Antonietta Breach, PA-C 03/11/22 2214

## 2022-03-12 ENCOUNTER — Emergency Department (HOSPITAL_COMMUNITY): Payer: Medicare Other

## 2022-03-12 DIAGNOSIS — N134 Hydroureter: Secondary | ICD-10-CM | POA: Diagnosis not present

## 2022-03-12 DIAGNOSIS — R109 Unspecified abdominal pain: Secondary | ICD-10-CM | POA: Diagnosis not present

## 2022-03-12 MED ORDER — SODIUM CHLORIDE 0.9 % IV BOLUS
1000.0000 mL | Freq: Once | INTRAVENOUS | Status: AC
  Administered 2022-03-12: 1000 mL via INTRAVENOUS

## 2022-03-12 MED ORDER — ONDANSETRON 4 MG PO TBDP
4.0000 mg | ORAL_TABLET | Freq: Once | ORAL | Status: AC
  Administered 2022-03-12: 4 mg via ORAL
  Filled 2022-03-12: qty 1

## 2022-03-12 MED ORDER — ONDANSETRON HCL 4 MG PO TABS: 4.0000 mg | ORAL_TABLET | Freq: Four times a day (QID) | ORAL | 0 refills | Status: AC

## 2022-03-12 MED ORDER — IOHEXOL 350 MG/ML SOLN
75.0000 mL | Freq: Once | INTRAVENOUS | Status: AC | PRN
  Administered 2022-03-12: 75 mL via INTRAVENOUS

## 2022-03-12 MED ORDER — OXYCODONE-ACETAMINOPHEN 5-325 MG PO TABS: 1.0000 | ORAL_TABLET | Freq: Four times a day (QID) | ORAL | 0 refills | Status: AC | PRN

## 2022-03-12 NOTE — ED Provider Notes (Signed)
Asked to reassess patient.  Patient presents to the ED due to right lower abdominal pain.  History of kidney stones.  Unfortunately, patient has waited over 16 hours and is inquiring about how much longer he needs to wait.  Patient states his wife has a mammogram at 60 AM and he is curious if he would be finished by then.  Reviewed CT abdomen.  It shows a 6 mm stone.  Advised patient he does have a longer wait and will likely not be finished by 11 AM.  Patient given Percocet and Zofran here in triage.  Most recent vitals stable.   Suzy Bouchard, PA-C 03/12/22 [id number]    Carmin Muskrat, MD 03/12/22 [phone]

## 2022-03-12 NOTE — Discharge Instructions (Addendum)
You were prescribed medication to help for pain, such as oxycodone. Please take 1 tablet every 6 hours to help with severe pain.  The second medication is zofran, please take this medication to help with your nausea.

## 2022-03-12 NOTE — ED Provider Notes (Signed)
[facility] EMERGENCY DEPARTMENT Provider Note   CSN: [id number] Arrival date & time: 03/11/22  1736     History  Chief Complaint  Patient presents with   Abdominal Pain    Melvin Kipp. is a 71 y.o. male.  70 year old male with a prior history of prostate cancer presents to the ED via [facility] with a chief complaint of right flank pain which began around 5:00 yesterday.  Patient reports symptoms originally started on Wednesday, the pain has gradually intensified describing it as a aching and sharp sensation to the right flank with radiation down his right lower quadrant.  There is no alleviating or exacerbating factors, he reports that when the pain does come he gets somewhat nauseated but has not vomited.  He does have a prior history of kidney stones, reports the pain is not quite as severe as he has previously been.  He did not take any medication for symptomatic treatment, however received a Percocet while in triage, reports his pain was about a 3 prior to receiving this pain medication.  He denies any diarrhea, no fevers, no chest pain, no shortness of breath.  The history is provided by the patient.  Abdominal Pain Pain location:  R flank Pain quality: aching and sharp   Pain radiates to:  Does not radiate Pain severity:  Mild Duration:  3 days Timing:  Intermittent Progression:  Improving Chronicity:  Recurrent Associated symptoms: nausea   Associated symptoms: no chest pain, no chills, no constipation, no diarrhea, no fever, no shortness of breath, no sore throat and no vomiting        Home Medications Prior to Admission medications   Medication Sig Start Date End Date Taking? Authorizing Provider  ondansetron (ZOFRAN) 4 MG tablet Take 1 tablet (4 mg total) by mouth every 6 (six) hours for 7 days. 03/12/22 03/19/22 Yes Tauri Ethington, Beverley Fiedler, PA-C  oxyCODONE-acetaminophen (PERCOCET/ROXICET) 5-325 MG tablet Take 1 tablet by mouth every 6 (six) hours as needed for  up to 3 days for severe pain. 03/12/22 03/15/22 Yes Tracey Stewart, PA-C  albuterol (VENTOLIN HFA) 108 (90 Base) MCG/ACT inhaler Inhale 1-2 puffs into the lungs daily as needed for wheezing or shortness of breath.    [provider]  amoxicillin (AMOXIL) 500 MG tablet Take 4 tablets (2,000 mg total) by mouth as directed. 1 hour prior to dental work including cleanings 10/22/21   Eileen Stanford, PA-C  aspirin 81 MG chewable tablet Chew 81 mg by mouth daily.    [provider]  atorvastatin (LIPITOR) 80 MG tablet TAKE 1 TABLET BY MOUTH EVERY DAY 05/13/21   Sueanne Margarita, MD  Bempedoic Acid-Ezetimibe (NEXLIZET) 180-10 MG TABS Take 1 tablet by mouth daily. 11/23/21   Sueanne Margarita, MD  clotrimazole-betamethasone (LOTRISONE) cream Apply 1 application. topically 2 (two) times daily.    [provider]  fenofibrate (TRICOR) 145 MG tablet TAKE 1 TABLET BY MOUTH EVERY DAY 06/22/21   Lenna Sciara, NP  furosemide (LASIX) 20 MG tablet Take 1 tablet (20 mg total) by mouth daily. 11/13/21   Eileen Stanford, PA-C  Garlic (GARLIQUE PO) Take 1 capsule by mouth daily.    [provider]  icosapent Ethyl (VASCEPA) 1 g capsule Take 2 capsules (2 g total) by mouth 2 (two) times daily. 06/12/21   Sueanne Margarita, MD  levocetirizine (XYZAL) 5 MG tablet Take 5 mg by mouth every evening. 06/12/19   [provider]  metoprolol tartrate (  LOPRESSOR) 50 MG tablet Take 50 mg by mouth 2 (two) times daily. 05/20/17   [provider]  Multiple Vitamin (MULTIVITAMIN WITH MINERALS) TABS tablet Take 1 tablet by mouth daily.    [provider]  nitroGLYCERIN (NITROSTAT) 0.4 MG SL tablet Place 1 tablet (0.4 mg total) under the tongue every 5 (five) minutes as needed for chest pain. 01/24/15   Sueanne Margarita, MD      Allergies    Lidocaine and Promethazine hcl    Review of Systems   Review of Systems  Constitutional:  Negative for chills and fever.  HENT:  Negative  for sore throat.   Respiratory:  Negative for shortness of breath.   Cardiovascular:  Negative for chest pain.  Gastrointestinal:  Positive for abdominal pain and nausea. Negative for constipation, diarrhea and vomiting.  Genitourinary:  Positive for flank pain.  Musculoskeletal:  Negative for back pain.  Neurological:  Negative for light-headedness and headaches.  All other systems reviewed and are negative.   Physical Exam Updated Vital Signs BP 136/66   Pulse 69   Temp 97.9 F (36.6 C) (Oral)   Resp 16   SpO2 100%  Physical Exam Vitals and nursing note reviewed.  Constitutional:      Appearance: He is well-developed.  HENT:     Head: Normocephalic and atraumatic.  Eyes:     General: No scleral icterus.    Pupils: Pupils are equal, round, and reactive to light.  Cardiovascular:     Heart sounds: Normal heart sounds.  Pulmonary:     Effort: Pulmonary effort is normal.     Breath sounds: Normal breath sounds. No wheezing.  Chest:     Chest wall: No tenderness.  Abdominal:     General: Bowel sounds are normal. There is no distension.     Palpations: Abdomen is soft.     Tenderness: There is no abdominal tenderness. There is right CVA tenderness.  Musculoskeletal:        General: No tenderness or deformity.     Cervical back: Normal range of motion.  Skin:    General: Skin is warm and dry.  Neurological:     Mental Status: He is alert and oriented to person, place, and time.     ED Results / Procedures / Treatments   Labs (all labs ordered are listed, but only abnormal results are displayed) Labs Reviewed  CBC WITH DIFFERENTIAL/PLATELET - Abnormal; Notable for the following components:      Result Value   WBC 15.4 (*)    Hemoglobin 12.6 (*)    HCT 38.6 (*)    Neutro Abs 12.5 (*)    Monocytes Absolute 1.1 (*)    Abs Immature Granulocytes 0.09 (*)    All other components within normal limits  COMPREHENSIVE METABOLIC PANEL - Abnormal; Notable for the following  components:   Glucose, Bld 172 (*)    BUN 27 (*)    Creatinine, Ser 1.47 (*)    Calcium 10.5 (*)    AST 47 (*)    GFR, Estimated 51 (*)    All other components within normal limits  URINALYSIS, ROUTINE W REFLEX MICROSCOPIC - Abnormal; Notable for the following components:   APPearance HAZY (*)    Hgb urine dipstick LARGE (*)    RBC / HPF >50 (*)    Bacteria, UA RARE (*)    All other components within normal limits  LIPASE, BLOOD    EKG None  Radiology CT ABDOMEN  PELVIS W CONTRAST  Result Date: 03/12/2022 CLINICAL DATA:  Right-sided flank/abdominal pain. EXAM: CT ABDOMEN AND PELVIS WITH CONTRAST TECHNIQUE: Multidetector CT imaging of the abdomen and pelvis was performed using the standard protocol following bolus administration of intravenous contrast. RADIATION DOSE REDUCTION: This exam was performed according to the departmental dose-optimization program which includes automated exposure control, adjustment of the mA and/or kV according to patient size and/or use of iterative reconstruction technique. CONTRAST:  47m OMNIPAQUE IOHEXOL 350 MG/ML SOLN COMPARISON:  MR pelvis July 23, 2019 and CT abdomen pelvis November 28, 2019. FINDINGS: Lower chest: No acute abnormality Hepatobiliary: Stable 17 mm cyst in the left lobe of the liver. Additional hypodense hepatic lesions are also stable and technically too small to accurately characterize but statistically likely to reflect benign cysts or hemangiomas. Gallbladder is unremarkable. No biliary ductal dilation. Pancreas: No pancreatic ductal dilation or evidence of acute inflammation. Spleen: No splenomegaly. Adrenals/Urinary Tract: Bilateral adrenal glands appear normal. Delayed right nephrogram with hydroureteronephrosis to a 6 mm stone in the distal ureter near the pelvic inlet. Additionally there is perinephric and periureteric stranding with some urothelial hyperenhancement of the ureter possibly reactive/sequela of back pressure however would  suggest correlation with laboratory values for superimposed infection. Additional nonobstructive bilateral renal calculi measure up to 4 mm Stable left renal cysts which are considered benign and require no independent imaging follow-up. Bilateral hypodense renal lesions are technically too small to accurately characterize but statistically likely to reflect cysts, these are also considered benign and require no independent imaging follow-up Stomach/Bowel: Stomach is unremarkable for degree of distension. No pathologic dilation of small or large bowel. Appendix is not confidently identified in its entirety however there are no findings to suggest acute appendicitis. Scattered colonic diverticulosis without findings of acute diverticulitis Vascular/Lymphatic: Stomach is unremarkable for degree of distension. No pathologic dilation of small or large bowel. Scattered colonic diverticulosis without findings of acute diverticulitis. Reproductive: Fiducial markers in the prostate gland. Other: Trace free fluid along the distal aspect of the right pericolic gutter is in continuity with the perinephric stranding and favored to reflect a component of that process. No walled off fluid collections. No pneumoperitoneum. Musculoskeletal: No acute osseous abnormality. IMPRESSION: 1. Delayed right nephrogram with hydroureteronephrosis to a 6 mm stone in the distal ureter near the pelvic inlet. Additionally there is perinephric and periureteric stranding with some urothelial hyperenhancement, suggest correlation with laboratory values for superimposed infection. 2. Additional nonobstructive bilateral renal calculi measure up to 4 mm. 3. Colonic diverticulosis without findings of acute diverticulitis. Electronically Signed   By: JDahlia BailiffM.D.   On: 03/12/2022 07:35    Procedures Procedures    Medications Ordered in ED Medications  oxyCODONE-acetaminophen (PERCOCET/ROXICET) 5-325 MG per tablet 1 tablet (1 tablet Oral  Given 03/12/22 1009)  iohexol (OMNIPAQUE) 350 MG/ML injection 75 mL (75 mLs Intravenous Contrast Given 03/12/22 0709)  ondansetron (ZOFRAN-ODT) disintegrating tablet 4 mg (4 mg Oral Given 03/12/22 1009)  sodium chloride 0.9 % bolus 1,000 mL (0 mLs Intravenous Stopped 03/12/22 1436)    ED Course/ Medical Decision Making/ A&P                           Medical Decision Making   This patient presents to the ED for concern of right flank pain, this involves a number of treatment options, and is a complaint that carries with it a high risk of complications and morbidity.  The differential diagnosis includes  appendicitis, obstruction, pyelonephritis versus renal colic.     Co morbidities: Discussed in HPI   Brief History:  Patient with prior history of kidney stones here with right flank pain which began 2 days ago, endorsing pain as aching and sharp to the right flank with radiation to his abdomen.  No prior surgical intervention to his abdomen.  There was a Percocet a Zofran while in triage with improvement and nearly resolution in symptoms.  EMR reviewed including pt PMHx, past surgical history and past visits to ER.   See HPI for more details   Lab Tests:  I ordered and independently interpreted labs.  The pertinent results include:    CBC with a leukocytosis, however this seems to be chronic.  Hemoglobin slightly decreased however no signs of bleeding at this time.  CMP with no electrolyte derangement, creatinine level is slightly elevated at 1.47, patient reports no intake since yesterday around 5 PM.  LFTs with AST slightly elevated at 47.  UA with greater than 50 white blood cells, rare bacteria and 6-10 white blood cell count.  Lipase level is normal.  Imaging Studies:  CT Abdomen and pelvis: 1. Delayed right nephrogram with hydroureteronephrosis to a 6 mm  stone in the distal ureter near the pelvic inlet. Additionally there  is perinephric and periureteric stranding with  some urothelial  hyperenhancement, suggest correlation with laboratory values for  superimposed infection.  2. Additional nonobstructive bilateral renal calculi measure up to 4  mm.  3. Colonic diverticulosis without findings of acute diverticulitis.   Medicines ordered:  I ordered medication including bolus  for AKI  Reevaluation of the patient after these medicines showed that the patient improved I have reviewed the patients home medicines and have made adjustments as needed  Reevaluation:  After the interventions noted above I re-evaluated patient and found that they have :improved   Social Determinants of Health:  The patient's social determinants of health were a factor in the care of this patient    Problem List / ED Course:  Patient here with right flank pain which began 3 days ago gradual onset accompanied by nausea but no vomiting.  Prior history of kidney stones reports similar presentation rating his pain an 8 out of 10, did not take any medication while at home.  Received Percocet along with Zofran while in triage.  Labs were benign, chronic leukocytosis.  CMP with a mild elevated creatinine at 1.47, he reports his last oral intake approximately 24 hours ago, has not been drinking or eating anything while in the waiting room.  Lipase level is within normal limits.  He does not have any other prior surgeries to his abdomen. Abdomen is soft, no focal tenderness no diarrhea or fever to suggest infectious process.  He does have a prior history of this, given a liter bolus to help with mild AKI with his elevated creatinine.  He is tolerating p.o. adequately without any nausea or vomiting.  Did discuss outpatient treatment with pain control, he is taking lasix to help with fluid overload, I did not prescribe Flomax in hopes to avoid hypovolemia in an elderly person.  I have provided a referral to [facility] which he will need to call if his symptoms do not improve after using  strainer and pain control.  Patient is agreeable with plan and treatment at this time.  Patient hemodynamically stable for discharge.   Dispostion:  After consideration of the diagnostic results and the patients response to treatment, I  feel that the patent would benefit from antibiotic treatment of his renal colic with pain control, antiemetics.     Portions of this note were generated with [employer]. Dictation errors may occur despite best attempts at proofreading.   Final Clinical Impression(s) / ED Diagnoses Final diagnoses:  Renal colic on right side    Rx / DC Orders ED Discharge Orders          Ordered    ondansetron (ZOFRAN) 4 MG tablet  Every 6 hours        03/12/22 1404    oxyCODONE-acetaminophen (PERCOCET/ROXICET) 5-325 MG tablet  Every 6 hours PRN        03/12/22 1404              Janeece Fitting, PA-C 03/12/22 1444    Horton, Barbette Hair, MD 03/12/22 1516

## 2022-03-18 DIAGNOSIS — N201 Calculus of ureter: Secondary | ICD-10-CM | POA: Diagnosis not present

## 2022-03-19 ENCOUNTER — Telehealth: Payer: Self-pay | Admitting: Cardiology

## 2022-03-19 ENCOUNTER — Telehealth: Payer: Self-pay

## 2022-03-19 ENCOUNTER — Other Ambulatory Visit: Payer: Self-pay | Admitting: Urology

## 2022-03-19 NOTE — Telephone Encounter (Signed)
  Patient Consent for Virtual Visit       Did the patient verbally consent to the visit?  Place your cursor on the next line. Then, press F2 or Fn plus F2 to select the list below.  Then, choose YES or NO.  :[id number]}   Melvin Guess. has provided verbal consent on 03/19/2022 for a virtual visit (video or telephone).   CONSENT FOR VIRTUAL VISIT FOR:  Melvin Guess.  By participating in this virtual visit I agree to the following:  I hereby voluntarily request, consent and authorize [facility] and its employed or contracted physicians, physician assistants, nurse practitioners or other licensed health care professionals (the Practitioner), to provide me with telemedicine health care services (the "Services") as deemed necessary by the treating Practitioner. I acknowledge and consent to receive the Services by the Practitioner via telemedicine. I understand that the telemedicine visit will involve communicating with the Practitioner through live audiovisual communication technology and the disclosure of certain medical information by electronic transmission. I acknowledge that I have been given the opportunity to request an in-person assessment or other available alternative prior to the telemedicine visit and am voluntarily participating in the telemedicine visit.  I understand that I have the right to withhold or withdraw my consent to the use of telemedicine in the course of my care at any time, without affecting my right to future care or treatment, and that the Practitioner or I may terminate the telemedicine visit at any time. I understand that I have the right to inspect all information obtained and/or recorded in the course of the telemedicine visit and may receive copies of available information for a reasonable fee.  I understand that some of the potential risks of receiving the Services via telemedicine include:  Delay or interruption in medical evaluation due to  technological equipment failure or disruption; Information transmitted may not be sufficient (e.g. poor resolution of images) to allow for appropriate medical decision making by the Practitioner; and/or  In rare instances, security protocols could fail, causing a breach of personal health information.  Furthermore, I acknowledge that it is my responsibility to provide information about my medical history, conditions and care that is complete and accurate to the best of my ability. I acknowledge that Practitioner's advice, recommendations, and/or decision may be based on factors not within their control, such as incomplete or inaccurate data provided by me or distortions of diagnostic images or specimens that may result from electronic transmissions. I understand that the practice of medicine is not an exact science and that Practitioner makes no warranties or guarantees regarding treatment outcomes. I acknowledge that a copy of this consent can be made available to me via my patient portal ([facility]), or I can request a printed copy by calling the office of [facility].    I understand that my insurance will be billed for this visit.   I have read or had this consent read to me. I understand the contents of this consent, which adequately explains the benefits and risks of the Services being provided via telemedicine.  I have been provided ample opportunity to ask questions regarding this consent and the Services and have had my questions answered to my satisfaction. I give my informed consent for the services to be provided through the use of telemedicine in my medical care

## 2022-03-19 NOTE — Telephone Encounter (Signed)
Pt is returning call. Transferred to [place], CMA.

## 2022-03-19 NOTE — Telephone Encounter (Signed)
   Name: Melvin Dunn.  DOB: Dec 07, 1950  MRN: [id number]  Primary Cardiologist: Fransico Him, MD   Preoperative team, please contact this patient and set up a phone call appointment for further preoperative risk assessment. Please obtain consent and complete medication review. Thank you for your help.  I confirm that guidance regarding antiplatelet and oral anticoagulation therapy has been completed and, if necessary, noted below.  Per Dr. Ali Lowe, patient may hold aspirin for 5-7 days prior to procedure. Please resume aspirin as soon as possible postprocedure, at the discretion of the surgeon.    Lenna Sciara, NP 03/19/2022, 4:29 PM [name]

## 2022-03-19 NOTE — Telephone Encounter (Signed)
Attempt to reach pt to reschedule tele visit. Pt answered but was having trouble hearing on his phone. I hung up and called again and had the same problem. I attempted to reach pt at home number listed but phone did not ring.

## 2022-03-19 NOTE — Telephone Encounter (Signed)
Pt returned called. Pt scheduled for a tele visit on 03/26/22. Med rec and consent done.

## 2022-03-19 NOTE — Telephone Encounter (Signed)
   Pre-operative Risk Assessment    Patient Name: Melvin Dunn.  DOB: 01/11/1951 MRN: [id number]      Request for Surgical Clearance    Procedure:   cystoscopy with a right retrograde pyelogram right ureteroscopic laser lithotripsy and right ureteral stink   Date of Surgery:  Clearance 04/01/22                                 Surgeon:  dr. Wyvonnia Dusky Group or Practice Name:  [facility]  Phone number:  [phone] Fax number:  [phone]   Type of Clearance Requested:   - Medical  - Pharmacy:  Hold Aspirin 5 days   Type of Anesthesia:  General    Additional requests/questions:      SignedMilbert Coulter   03/19/2022, 10:22 AM

## 2022-03-26 ENCOUNTER — Encounter: Payer: Self-pay | Admitting: Nurse Practitioner

## 2022-03-26 ENCOUNTER — Ambulatory Visit: Payer: Medicare Other | Attending: Interventional Cardiology | Admitting: Nurse Practitioner

## 2022-03-26 DIAGNOSIS — Z01810 Encounter for preprocedural cardiovascular examination: Secondary | ICD-10-CM

## 2022-03-26 NOTE — Progress Notes (Signed)
Virtual Visit via Telephone Note   Because of Melvin SHELP Jr.'s co-morbid illnesses, he is at least at moderate risk for complications without adequate follow up.  This format is felt to be most appropriate for this patient at this time.  The patient did not have access to video technology/had technical difficulties with video requiring transitioning to audio format only (telephone).  All issues noted in this document were discussed and addressed.  No physical exam could be performed with this format.  Please refer to the patient's chart for his consent to telehealth for Melvin Dunn.  Evaluation Performed:  Preoperative cardiovascular risk assessment _____________   Date:  03/26/2022   Patient ID:  Melvin Guess., DOB 08/15/1950, MRN [id number] Patient Location:  Home Provider location:   Office  Primary Care Provider:  Shirline Frees, MD Primary Cardiologist:  Melvin Him, MD  Chief Complaint / Patient Profile   71 y.o. y/o male with a h/o CAD s/p CABG x 4 with patent LIMA-LAD and vein graft to second obtuse marginal, SVG to RCA and RPLV has serial high-grade stenoses on cath 07/2021 but patient was not having any angina, HLD, type 2 DM, HTN, chronic HFpEF, prostate cancer, severe aortic stenosis s/p TAVR (10/13/2021)  who is pending cystoscopy with right retrograde pyelogram, right ureteroscopic laser lithotripsy and right ureteral stent and presents today for telephonic preoperative cardiovascular risk assessment.  Past Medical History    Past Medical History:  Diagnosis Date   Asthma    no current inhaler use   CAD (coronary artery disease), native coronary artery    LIMA to LAD, SVG to OM1, SVG to RCA-RPL, EVH via right thigh and leg   Colon polyp    Environmental allergies    History of kidney stones    Hyperlipidemia    Pre-diabetes    Prostate cancer ([place])    adenocarcimona, of the prostate, 5% of single core 01/2011-watching and waiting for now   S/P TAVR  (transcatheter aortic valve replacement) 10/13/2021   s/p TAVR with a 75m Edwards S3UR via the left carotid approach by Dr. TAli Lowe& Dr. BCyndia Bent  Seizure ([facility]    due to lidocaine allergy with prostate biopsy this year   Severe aortic stenosis    Past Surgical History:  Procedure Laterality Date   CORONARY ARTERY BYPASS GRAFT N/A 02/13/2014   Procedure: CORONARY ARTERY BYPASS GRAFTING (CABG) TIMES FOUR USING LEFT INTERNAL MAMMARY ARTERY AND RIGHT SAPHENOUS LEG VEIN HARVESTED ENDOSCOPICALLY;  Surgeon: CRexene Alberts MD;  [place] [facility]  Service: Open Heart Surgery;  Laterality: N/A;   CYSTOSCOPY WITH LITHOLAPAXY N/A 03/17/2020   Procedure: CYSTOSCOPY WITH LITHOLAPAXY;  Surgeon: BRaynelle Bring MD;  [facility]  Service: Urology;  Laterality: N/A;   [place]oscopy  15 yrs ago   [place]   EYE SURGERY Bilateral 2015   cataracts   GOLD SEED IMPLANT N/A 03/17/2020   Procedure: GOLD SEED IMPLANT;  Surgeon: BRaynelle Bring MD;  [facility]  Service: Urology;  Laterality: N/A;   INTRAOPERATIVE TRANSESOPHAGEAL ECHOCARDIOGRAM N/A 02/13/2014   Procedure: INTRAOPERATIVE TRANSESOPHAGEAL ECHOCARDIOGRAM;  Surgeon: CRexene Alberts MD;  [place] [facility]  Service: Open Heart Surgery;  Laterality: N/A;   INTRAOPERATIVE TRANSESOPHAGEAL ECHOCARDIOGRAM N/A 10/13/2021   Procedure: INTRAOPERATIVE TRANSESOPHAGEAL ECHOCARDIOGRAM;  Surgeon: TEarly Osmond MD;  [facility];  Service: Open Heart Surgery;  Laterality: N/A;   LEFT HEART CATHETERIZATION  WITH CORONARY ANGIOGRAM N/A 02/12/2014   Procedure: LEFT HEART CATHETERIZATION WITH CORONARY ANGIOGRAM;  Surgeon: Blane Ohara, MD;  Location: [facility] CATH LAB;  Service: Cardiovascular;  Laterality: N/A;   left leg surgery for fracture  2004   plates inserted   PROSTATE BIOPSY  06/2019   RIGHT/LEFT HEART CATH AND CORONARY/GRAFT ANGIOGRAPHY N/A 07/31/2021   Procedure:  RIGHT/LEFT HEART CATH AND CORONARY/GRAFT ANGIOGRAPHY;  Surgeon: Early Osmond, MD;  Location: [place] CV LAB;  Service: Cardiovascular;  Laterality: N/A;   SPACE OAR INSTILLATION N/A 03/17/2020   Procedure: SPACE OAR INSTILLATION;  Surgeon: Raynelle Bring, MD;  Location[facility];  Service: Urology;  Laterality: N/A;   TRANSCATHETER AORTIC VALVE REPLACEMENT, CAROTID Left 10/13/2021   Procedure: Transcatheter Aortic Valve Replacement-Carotid;  Surgeon: Early Osmond, MD;  Location: [place];  Service: Open Heart Surgery;  Laterality: Left;   ULTRASOUND GUIDANCE FOR VASCULAR ACCESS Right 10/13/2021   Procedure: ULTRASOUND GUIDANCE FOR VASCULAR ACCESS;  Surgeon: Early Osmond, MD;  Location: [place];  Service: Open Heart Surgery;  Laterality: Right;    Allergies  Allergies  Allergen Reactions   Lidocaine     Seizure with biopsy with lidocaine this year   Promethazine Hcl     stomach upset    History of Present Illness    [name] is a 71 y.o. male who presents via audio/video conferencing for a telehealth visit today.  Pt was last seen in cardiology clinic on 12/09/2021 by Melvin Range, PA.  At that time Melvin Guess. was doing well with 1 month follow-up echo post TAVR showing normally functioning valve. He was started on Lasix 20 mg daily for elevated filling pressures on echo.  The patient is now pending procedure as outlined above. Since his last visit, he  denies chest pain, shortness of breath, lower extremity edema, fatigue, palpitations, melena, hematuria, hemoptysis, diaphoresis, weakness, presyncope, syncope, orthopnea, and PND.   Home Medications    Prior to Admission medications   Medication Sig Start Date End Date Taking? Authorizing Provider  albuterol (VENTOLIN HFA) 108 (90 Base) MCG/ACT inhaler Inhale 1-2 puffs into the lungs every 6 (six) hours as needed for wheezing or shortness of breath.    [provider]  amoxicillin (AMOXIL)  500 MG tablet Take 4 tablets (2,000 mg total) by mouth as directed. 1 hour prior to dental work including cleanings 10/22/21   Eileen Stanford, PA-C  aspirin 81 MG chewable tablet Chew 81 mg by mouth daily.    [provider]  atorvastatin (LIPITOR) 80 MG tablet TAKE 1 TABLET BY MOUTH EVERY DAY 05/13/21   Sueanne Margarita, MD  Bempedoic Acid-Ezetimibe (NEXLIZET) 180-10 MG TABS Take 1 tablet by mouth daily. 11/23/21   Sueanne Margarita, MD  clotrimazole-betamethasone (LOTRISONE) cream Apply 1 application. topically 2 (two) times daily.    [provider]  fenofibrate (TRICOR) 145 MG tablet TAKE 1 TABLET BY MOUTH EVERY DAY 06/22/21   Lenna Sciara, NP  furosemide (LASIX) 20 MG tablet Take 1 tablet (20 mg total) by mouth daily. 11/13/21   Eileen Stanford, PA-C  Garlic (GARLIQUE PO) Take 1 capsule by mouth daily.    [provider]  icosapent Ethyl (VASCEPA) 1 g capsule Take 2 capsules (2 g total) by mouth 2 (two) times daily. 06/12/21   Sueanne Margarita, MD  levocetirizine (XYZAL) 5 MG tablet Take 5 mg by mouth every evening. 06/12/19   [provider]  metoprolol tartrate (LOPRESSOR) 50 MG tablet Take 50 mg by mouth 2 (two) times daily. 05/20/17   [provider]  nitroGLYCERIN (NITROSTAT) 0.4 MG SL tablet Place 1 tablet (0.4 mg total) under the tongue every 5 (five) minutes as needed for chest pain. 01/24/15   Sueanne Margarita, MD  ondansetron (ZOFRAN) 4 MG tablet Take 4 mg by mouth every 6 (six) hours as needed for nausea or vomiting.    [provider]  oxyCODONE-acetaminophen (PERCOCET/ROXICET) 5-325 MG tablet Take 1 tablet by mouth every 6 (six) hours as needed for severe pain.    [provider]  tamsulosin (FLOMAX) 0.4 MG CAPS capsule Take 0.4 mg by mouth at bedtime. 03/18/22   [provider]    Physical Exam    Vital Signs:  Melvin Guess. does not have vital signs available for review today.  Given telephonic nature of  communication, physical exam is limited. AAOx3. NAD. Normal affect.  Speech and respirations are unlabored.  Accessory Clinical Findings    None  Assessment & Plan    1.  Preoperative Cardiovascular Risk Assessment: The patient is doing well from a cardiac perspective. Therefore, based on ACC/AHA guidelines, the patient would be at acceptable risk for the planned procedure without further cardiovascular testing. According to the Revised Cardiac Risk Index (RCRI), his Perioperative Risk of Major Cardiac Event is (%): 0.9 His Functional Capacity in METs is: 7.04 according to the [facility] Activity Status Index (DASI).  The patient was advised that if he develops new symptoms prior to surgery to contact our office to arrange for a follow-up visit, and he verbalized understanding.  He may hold aspirin for 5 days prior to urological procedure. He should resume as soon as hemodynamically stable.   A copy of this note will be routed to requesting surgeon.  Time:   Today, I have spent 8 minutes with the patient with telehealth technology discussing medical history, symptoms, and management plan.     Emmaline Life, NP-C  03/26/2022, 2:21 PM [address], [address] Office [phone] Fax [phone]

## 2022-03-27 NOTE — Progress Notes (Addendum)
COVID Vaccine received:  _0  No _1  Yes Date of any COVID positive Test in last 90 days:  none  PCP - Shirline Frees, MD Cardiologist -  Fransico Him, MD Nell Range, [place]  Chest x-ray - 10-09-2021 EKG - 10-09-2021  Stress Test - 07-10-2018 ECHO - 11-13-2021 Cardiac Cath - 07-31-2021  PCR screen: _2  Ordered & Completed                      _3   No Order but Needs PROFEND                      _4   N/A for this surgery  Surgery Plan:  _5  Ambulatory                            _6  Outpatient in bed                            _7  Admit  Anesthesia:    _8  General  _9  Spinal                           _10   Choice _11   MAC  Bowel Prep - _12  No  _13   Yes _____________  Pacemaker / ICD device _14  No _15  Yes        Device order form faxed _16  No    _17   Yes      Faxed to:  Spinal Cord Stimulator:_18  No _19  Yes      (Remind patient to bring remote DOS) Other Implants:   History of Sleep Apnea? _20  No _21  Yes   CPAP used?- _22  No _23  Yes    Does the patient monitor blood sugar? _24  No _25  Yes  _26  N/A Does patient have a [employer] or Dexacom? _27  No _28  Yes   Fasting Blood Sugar Ranges-  Checks Blood Sugar __0_ times a day Patient is not on any DM medications, Just watches his diet  Blood Thinner / Instructions: None Aspirin Instructions: stopped on Thursday 03-25-22  ERAS Protocol Ordered: _29  No  _30  Yes PRE-SURGERY _31  ENSURE  _32  G2  _33  No Drink Ordered  Patient is to be NPO after: Midnight prior  Comments: hx seizure related to Lidocaine that was used when he had a prostate biopsy, MD thought he was given too much.  Patient had bloodwork (CBC diff, CMP) on 03-11-2022  Epic  Activity level: Patient can climb a flight of stairs without difficulty; _34  No CP  _35  No SOB,   Patient can perform ADLs without assistance.   Anesthesia review: CAD- CABG x4 (02-13-2014), TAVR (10-13-2021), DM2, Chronic HFpEF, HTN  Patient denies shortness of breath, fever, cough and chest pain at PAT  appointment.  Patient verbalized understanding and agreement to the Pre-Surgical Instructions that were given to them at this PAT appointment. Patient was also educated of the need to review these PAT instructions again prior to his/her surgery.I reviewed the appropriate phone numbers to call if they have any and questions or concerns.

## 2022-03-27 NOTE — Patient Instructions (Addendum)
SURGICAL WAITING ROOM VISITATION Patients having surgery or a procedure may have no more than 2 support people in the waiting area - these visitors may rotate in the visitor waiting room.   Children under the age of 67 must have an adult with them who is not the patient. If the patient needs to stay at the hospital during part of their recovery, the visitor guidelines for inpatient rooms apply.  PRE-OP VISITATION  Pre-op nurse will coordinate an appropriate time for 1 support person to accompany the patient in pre-op.  This support person may not rotate.  This visitor will be contacted when the time is appropriate for the visitor to come back in the pre-op area.  Please refer to the [facility] website for the visitor guidelines for Inpatients (after your surgery is over and you are in a regular room).  You are not required to quarantine at this time prior to your surgery. However, you must do this: Hand Hygiene often Do NOT share personal items Notify your provider if you are in close contact with someone who has COVID or you develop fever 100.4 or greater, new onset of sneezing, cough, sore throat, shortness of breath or body aches.  If you test positive for Covid or have been in contact with anyone that has tested positive in the last 10 days please notify you surgeon.    Your procedure is scheduled on: Thursday  April 01, 2022   Report to [facility] Main Entrance: [address] entrance where the [employer] is available.   Report to admitting at: 08:30 AM  +++++Call this number if you have any questions or problems the morning of surgery [phone]  DO NOT EAT OR DRINK ANYTHING AFTER MIDNIGHT THE NIGHT PRIOR TO YOUR SURGERY / PROCEDURE.   FOLLOW BOWEL PREP AND ANY ADDITIONAL PRE OP INSTRUCTIONS YOU RECEIVED FROM YOUR SURGEON'S OFFICE!!!   Oral Hygiene is also important to reduce your risk of infection.        Remember - BRUSH YOUR TEETH THE MORNING OF SURGERY WITH YOUR  REGULAR TOOTHPASTE  Take ONLY these medicines the morning of surgery with A SIP OF WATER: Metoprolol. IF needed, you may take the Percocet, Ondansetron(Zofran), and use your Albuterol inhaler if needed.                  You may not have any metal on your body including  jewelry, and body piercing  Do not wear  lotions, powders, cologne, or deodorant  Contacts, Hearing Aids, dentures or bridgework may not be worn into surgery.   Patients discharged on the day of surgery will not be allowed to drive home.  Someone NEEDS to stay with you for the first 24 hours after anesthesia.  Do not bring your home medications to the hospital. The Pharmacy will dispense medications listed on your medication list to you during your admission in the Hospital.  Please read over the following fact sheets you were given: IF YOU HAVE QUESTIONS ABOUT YOUR PRE-OP INSTRUCTIONS, PLEASE CALL [phone]  ([place])   [place] - Preparing for Surgery Before surgery, you can play an important role.  Because skin is not sterile, your skin needs to be as free of germs as possible.  You can reduce the number of germs on your skin by washing with CHG (chlorahexidine gluconate) soap before surgery.  CHG is an antiseptic cleaner which kills germs and bonds with the skin to continue killing germs even after washing. Please DO  NOT use if you have an allergy to CHG or antibacterial soaps.  If your skin becomes reddened/irritated stop using the CHG and inform your nurse when you arrive at Short Stay. Do not shave (including legs and underarms) for at least 48 hours prior to the first CHG shower.  You may shave your face/neck.  Please follow these instructions carefully:  1.  Shower with CHG Soap the night before surgery and the  morning of surgery.  2.  If you choose to wash your hair, wash your hair first as usual with your normal  shampoo.  3.  After you shampoo, rinse your hair and body thoroughly to remove the shampoo.                              4.  Use CHG as you would any other liquid soap.  You can apply chg directly to the skin and wash.  Gently with a scrungie or clean washcloth.  5.  Apply the CHG Soap to your body ONLY FROM THE NECK DOWN.   Do not use on face/ open                           Wound or open sores. Avoid contact with eyes, ears mouth and genitals (private parts).                       Wash face,  Genitals (private parts) with your normal soap.             6.  Wash thoroughly, paying special attention to the area where your  surgery  will be performed.  7.  Thoroughly rinse your body with warm water from the neck down.  8.  DO NOT shower/wash with your normal soap after using and rinsing off the CHG Soap.            9.  Pat yourself dry with a clean towel.            10.  Wear clean pajamas.            11.  Place clean sheets on your bed the night of your first shower and do not  sleep with pets.  ON THE DAY OF SURGERY : Do not apply any lotions/deodorants the morning of surgery.  Please wear clean clothes to the hospital/surgery center.    FAILURE TO FOLLOW THESE INSTRUCTIONS MAY RESULT IN THE CANCELLATION OF YOUR SURGERY  PATIENT SIGNATURE_________________________________  NURSE SIGNATURE__________________________________  ________________________________________________________________________

## 2022-03-29 ENCOUNTER — Other Ambulatory Visit: Payer: Self-pay

## 2022-03-29 ENCOUNTER — Encounter (HOSPITAL_COMMUNITY)
Admission: RE | Admit: 2022-03-29 | Discharge: 2022-03-29 | Disposition: A | Discharge status: Home / Self Care | Payer: Medicare Other | Source: Ambulatory Visit | Attending: Urology | Admitting: Urology

## 2022-03-29 ENCOUNTER — Encounter (HOSPITAL_COMMUNITY): Payer: Self-pay

## 2022-03-29 VITALS — BP 134/64 | HR 68 | Temp 98.4°F | Resp 16 | Ht 66.0 in | Wt 161.0 lb

## 2022-03-29 DIAGNOSIS — I5032 Chronic diastolic (congestive) heart failure: Secondary | ICD-10-CM | POA: Insufficient documentation

## 2022-03-29 DIAGNOSIS — Z951 Presence of aortocoronary bypass graft: Secondary | ICD-10-CM | POA: Diagnosis not present

## 2022-03-29 DIAGNOSIS — I110 Hypertensive heart disease with heart failure: Secondary | ICD-10-CM | POA: Diagnosis not present

## 2022-03-29 DIAGNOSIS — I350 Nonrheumatic aortic (valve) stenosis: Secondary | ICD-10-CM | POA: Insufficient documentation

## 2022-03-29 DIAGNOSIS — E119 Type 2 diabetes mellitus without complications: Secondary | ICD-10-CM | POA: Insufficient documentation

## 2022-03-29 DIAGNOSIS — I2510 Atherosclerotic heart disease of native coronary artery without angina pectoris: Secondary | ICD-10-CM | POA: Diagnosis not present

## 2022-03-29 DIAGNOSIS — N201 Calculus of ureter: Secondary | ICD-10-CM | POA: Diagnosis not present

## 2022-03-29 DIAGNOSIS — Z01812 Encounter for preprocedural laboratory examination: Secondary | ICD-10-CM | POA: Diagnosis not present

## 2022-03-29 HISTORY — DX: Essential (primary) hypertension: I10

## 2022-03-29 HISTORY — DX: Heart failure, unspecified: I50.9

## 2022-03-29 LAB — GLUCOSE, CAPILLARY: Glucose-Capillary: 163 mg/dL — ABNORMAL HIGH (ref 70–99)

## 2022-03-29 LAB — HEMOGLOBIN A1C
Hgb A1c MFr Bld: 6.5 % — ABNORMAL HIGH (ref 4.8–5.6)
Mean Plasma Glucose: 139.85 mg/dL

## 2022-03-30 NOTE — Progress Notes (Addendum)
Anesthesia Chart Review:   Case: [id number] Date/Time: 04/01/22 1030   Procedure: CYSTOSCOPY/RIGHT URETEROSCOPY/HOLMIUM LASER/RIGHT RETROGRADE PYELOGRAM/RIGHT URETERAL STENT PLACEMENT (Right) - 60 MINUTES NEEDED FOR CASE   Anesthesia type: General   Pre-op diagnosis: RIGHT URETERAL CALCULUS   Location: [place] [facility]   Surgeons: Raynelle Bring, MD       DISCUSSION: Pt is 71 years old with hx CAD (s/p CABG x3 2015), aortic stenosis (s/p TAVR 10/13/21), HFpEF, HTN, prostate cancer, seizure, pre-diabetes, asthma  VS: BP 134/64 Comment: right arm sitting  Pulse 68   Temp 36.9 C (Oral)   Resp 16   Ht '5\' 6"'$  (1.676 m)   Wt 73 kg   SpO2 98%   BMI 25.99 kg/m   PROVIDERS: - PCP is Shirline Frees, MD - Cardiologist is Fransico Him, MD. Cleared for surgery at acceptable risk by telephone visit by Christen Bame, NP 03/26/22. Last In person office visit 12/09/21 with Nell Range, PA  LABS: Labs reviewed: Acceptable for surgery. (all labs ordered are listed, but only abnormal results are displayed) CMP 03/11/22: Glucose 172, Cr 1.47, BUN 27, Calcium 10.5, AST 47 CBC w/diff 03/11/22: WBC 15.4. H/H 12.6/38.6  Labs Reviewed  HEMOGLOBIN A1C - Abnormal; Notable for the following components:      Result Value   Hgb A1c MFr Bld 6.5 (*)    All other components within normal limits  GLUCOSE, CAPILLARY - Abnormal; Notable for the following components:   Glucose-Capillary 163 (*)    All other components within normal limits     IMAGES: CTA chest, abd, pelvis 08/04/21:  1. Vascular findings and measurements pertinent to potential TAVR procedure, as detailed above. 2. Severe thickening and calcification of the aortic valve, compatible with reported clinical history of severe aortic stenosis.  3. Aortic atherosclerosis, in addition to left main and three-vessel coronary artery disease. Status post median sternotomy for CABG including LIMA to the LAD. 4. Colonic diverticulosis  without evidence of acute diverticulitis at this time. 5. Additional incidental findings, as above.   EKG 10/22/21: NSR   CV: Echo 11/13/21:  1. Left ventricular ejection fraction, by estimation, is 60 to 65%. The left ventricle has normal function. The left ventricle has no regional wall motion abnormalities. There is mild concentric left ventricular hypertrophy. Left ventricular diastolic parameters are indeterminate. Elevated left ventricular end-diastolic pressure.  2. Right ventricular systolic function is normal. The right ventricular size is normal. There is normal pulmonary artery systolic pressure. The estimated right ventricular systolic pressure is 22.4 mmHg.  3. The mitral valve is normal in structure. Trivial mitral valve regurgitation. No evidence of mitral stenosis.  4. The aortic valve has been repaired/replaced. Perivalvular Aortic valve regurgitation is trivial. No aortic stenosis is present. Aortic valve mean gradient measures 10.5 mmHg. Aortic valve Vmax measures 2.28 m/s. DVI 0.46.  5. The inferior vena cava is normal in size with greater than 50% respiratory variability, suggesting right atrial pressure of 3 mmHg.   CT coronary morphology 08/04/21:  1.  Tri leaflet AV with score 1445 and AVA by planimetry 1.6 cm2 2. Optimum angiographic angle for deployment LAO 11 Caudal 11 degrees 3. Coronary arteries sufficient height above annulus for deployment. Patent SVG's to RCA/OM Patent LIMA to LAD 4. Annular area of 421.6 mm2 suitable for a 23 mm Sapien 3 valve. Right sinus diameter a bit small for 29 mm Medtronic Evolut Valve 5. Protruding soft plaque in descending thoracic aorta may be a source of embolus with  device advancement  Cardiac cath 07/31/21:    Prox Graft-1 lesion before RPDA  is 70% stenosed.   Prox Graft-2 lesion before RPDA  is 80% stenosed.   Dist Graft lesion before RPDA  is 65% stenosed.   Mid LM lesion is 80% stenosed.   Mid LAD lesion is 100% stenosed.    Prox LAD lesion is 70% stenosed.   Dist RCA lesion is 100% stenosed.   Ost Cx lesion is 100% stenosed.   1.  Patent LIMA to LAD and vein graft to second obtuse marginal.  The sequential vein graft to the right coronary artery and R PLV branch has serial high-grade stenoses.  Given the fact the patient has no angina should be treated with medical therapy for now. 2.  Severe native multivessel disease. 3.  Normal cardiac output of 8.8 L/min and index of 4.7 L/min/m with mean right atrial pressure of 6 mmHg and mean wedge pressure of 15 mmHg. 4.  Capacious iliofemoral vessels bilaterally with femoral bifurcation on the left side at the mid femoral head.   Past Medical History:  Diagnosis Date   Asthma    no current inhaler use   CAD (coronary artery disease), native coronary artery    LIMA to LAD, SVG to OM1, SVG to RCA-RPL, EVH via right thigh and leg   CHF (congestive heart failure) (HCC)    Colon polyp    Environmental allergies    History of kidney stones    Hyperlipidemia    Hypertension    Pre-diabetes    Prostate cancer ([place])    adenocarcimona, of the prostate, 5% of single core 01/2011-watching and waiting for now   S/P TAVR (transcatheter aortic valve replacement) 10/13/2021   s/p TAVR with a 39m Edwards S3UR via the left carotid approach by Dr. TAli Lowe& Dr. BCyndia Bent  Seizure ([facility]    due to lidocaine allergy with prostate biopsy this year   Severe aortic stenosis     Past Surgical History:  Procedure Laterality Date   CORONARY ARTERY BYPASS GRAFT N/A 02/13/2014   Procedure: CORONARY ARTERY BYPASS GRAFTING (CABG) TIMES FOUR USING LEFT INTERNAL MAMMARY ARTERY AND RIGHT SAPHENOUS LEG VEIN HARVESTED ENDOSCOPICALLY;  Surgeon: CRexene Alberts MD;  [place] [facility]  Service: Open Heart Surgery;  Laterality: N/A;   CYSTOSCOPY WITH LITHOLAPAXY N/A 03/17/2020   Procedure: CYSTOSCOPY WITH LITHOLAPAXY;  Surgeon: BRaynelle Bring MD;  [facility]  Service:  Urology;  Laterality: N/A;   cytoscopy  15 yrs ago   EZalmaLITHOTRIPSY  15 yrs ago   EYE SURGERY Bilateral 2015   cataracts   GOLD SEED IMPLANT N/A 03/17/2020   Procedure: GOLD SEED IMPLANT;  Surgeon: BRaynelle Bring MD;  [facility]  Service: Urology;  Laterality: N/A;   INTRAOPERATIVE TRANSESOPHAGEAL ECHOCARDIOGRAM N/A 02/13/2014   Procedure: INTRAOPERATIVE TRANSESOPHAGEAL ECHOCARDIOGRAM;  Surgeon: CRexene Alberts MD;  [place] [facility]  Service: Open Heart Surgery;  Laterality: N/A;   INTRAOPERATIVE TRANSESOPHAGEAL ECHOCARDIOGRAM N/A 10/13/2021   Procedure: INTRAOPERATIVE TRANSESOPHAGEAL ECHOCARDIOGRAM;  Surgeon: TEarly Osmond MD;  [facility]  Service: Open Heart Surgery;  Laterality: N/A;   LEFT HEART CATHETERIZATION WITH CORONARY ANGIOGRAM N/A 02/12/2014   Procedure: LEFT HEART CATHETERIZATION WITH CORONARY ANGIOGRAM;  Surgeon: MBlane Ohara MD;  [facility];  Service: Cardiovascular;  Laterality: N/A;   left leg surgery for fracture  2004   plates inserted   PROSTATE BIOPSY  06/2019  RIGHT/LEFT HEART CATH AND CORONARY/GRAFT ANGIOGRAPHY N/A 07/31/2021   Procedure: RIGHT/LEFT HEART CATH AND CORONARY/GRAFT ANGIOGRAPHY;  Surgeon: Early Osmond, MD;  Location: [place] CV LAB;  Service: Cardiovascular;  Laterality: N/A;   SPACE OAR INSTILLATION N/A 03/17/2020   Procedure: SPACE OAR INSTILLATION;  Surgeon: Raynelle Bring, MD;  Location: [facility];  Service: Urology;  Laterality: N/A;   TRANSCATHETER AORTIC VALVE REPLACEMENT, CAROTID Left 10/13/2021   Procedure: Transcatheter Aortic Valve Replacement-Carotid;  Surgeon: Early Osmond, MD;  Location: [place];  Service: Open Heart Surgery;  Laterality: Left;   ULTRASOUND GUIDANCE FOR VASCULAR ACCESS Right 10/13/2021   Procedure: ULTRASOUND GUIDANCE FOR VASCULAR ACCESS;  Surgeon: Early Osmond, MD;  Location: [place];  Service: Open Heart Surgery;   Laterality: Right;    MEDICATIONS:  albuterol (VENTOLIN HFA) 108 (90 Base) MCG/ACT inhaler   amoxicillin (AMOXIL) 500 MG tablet   aspirin 81 MG chewable tablet   atorvastatin (LIPITOR) 80 MG tablet   Bempedoic Acid-Ezetimibe (NEXLIZET) 180-10 MG TABS   clotrimazole-betamethasone (LOTRISONE) cream   fenofibrate (TRICOR) 145 MG tablet   furosemide (LASIX) 20 MG tablet   Garlic (GARLIQUE PO)   icosapent Ethyl (VASCEPA) 1 g capsule   levocetirizine (XYZAL) 5 MG tablet   metoprolol tartrate (LOPRESSOR) 50 MG tablet   nitroGLYCERIN (NITROSTAT) 0.4 MG SL tablet   ondansetron (ZOFRAN) 4 MG tablet   oxyCODONE-acetaminophen (PERCOCET/ROXICET) 5-325 MG tablet   tamsulosin (FLOMAX) 0.4 MG CAPS capsule   No current facility-administered medications for this encounter.    sodium phosphate (FLEET) 7-19 GM/118ML enema 1 enema    If no changes, I anticipate pt can proceed with surgery as scheduled.   Willeen Cass, PhD, FNP-BC [facility]/Anesthesiology Phone: [phone] 03/30/2022 9:27 AM

## 2022-03-30 NOTE — Anesthesia Preprocedure Evaluation (Signed)
Anesthesia Evaluation  Patient identified by MRN, date of birth, ID band Patient awake    Reviewed: Allergy & Precautions, NPO status , Patient's Chart, lab work & pertinent test results  History of Anesthesia Complications Negative for: history of anesthetic complications  Airway Mallampati: III  TM Distance: >3 FB Neck ROM: Full    Dental  (+) Dental Advisory Given   Pulmonary asthma    Pulmonary exam normal        Cardiovascular hypertension, + CAD, + CABG (2015) and +CHF  Normal cardiovascular exam+ Valvular Problems/Murmurs (s/p TAVR 10/13/21) AS    Echo 11/13/21: EF 60-65%, no RWMA, mild LVH, normal RVSF, trivial MR, s/p TAVR with trivial perivalvular AR, no AS (MG 10.5)   Neuro/Psych negative neurological ROS     GI/Hepatic negative GI ROS, Neg liver ROS,,,  Endo/Other  diabetes    Renal/GU Renal InsufficiencyRenal disease (R ureteral calculus; Cr 1.47)   Prostate cancer    Musculoskeletal negative musculoskeletal ROS (+)    Abdominal   Peds  Hematology  (+) Blood dyscrasia, anemia Hgb 12.6   Anesthesia Other Findings   Reproductive/Obstetrics                             Anesthesia Physical Anesthesia Plan  ASA: 3  Anesthesia Plan: General   Post-op Pain Management: Tylenol PO (pre-op)*   Induction: Intravenous  PONV Risk Score and Plan: 2 and Ondansetron, Dexamethasone, Midazolam and Treatment may vary due to age or medical condition  Airway Management Planned: LMA  Additional Equipment: None  Intra-op Plan:   Post-operative Plan: Extubation in OR  Informed Consent: I have reviewed the patients History and Physical, chart, labs and discussed the procedure including the risks, benefits and alternatives for the proposed anesthesia with the patient or authorized representative who has indicated his/her understanding and acceptance.     Dental advisory given  Plan  Discussed with:   Anesthesia Plan Comments: (See APP note by Joslyn Hy, FNP )       Anesthesia Quick Evaluation

## 2022-03-31 NOTE — H&P (Signed)
Office Visit Report     03/18/2022   --------------------------------------------------------------------------------   Melvin Dunn  MRN: [id number]  DOB: Mar 04, 1951, 71 year old Male  SSN: [id number]   PRIMARY CARE:  Minette Brine, MD  REFERRING:  Raynelle Bring, Eduardo Osier  PROVIDER:  Raynelle Bring, M.D.  LOCATION:  [facility] [phone]     --------------------------------------------------------------------------------   CC/HPI: 1. Locally advanced prostate cancer  2. BPH/LUTS  3. Urolithiasis   For full details, please see my note from 10/21/2021. Melvin Dunn is seen today for a new complaint of a new 6 mm right ureteral stone. He developed the acute onset of severe pain approximately 1 week ago prompting a visit to the emergency department. He had right-sided flank pain with radiation to his right abdomen. He denied any fever. He did have some nausea and vomiting. A CT scan on 03/12/2022 was performed. This demonstrated a 6 mm distal right ureteral calculus with associated hydronephrosis. He was prescribed hydrocodone and antiemetics. Fortunately, he has not had any pain since that day. He presents today for further evaluation.     ALLERGIES: Lidocaine Promethazine    MEDICATIONS: Aspirin 325 mg tablet  Levaquin 750 mg tablet Take the morning of your prostate biopsy.  Metoprolol Tartrate 25 mg tablet  Albuterol Sulfate  Atorvastatin Calcium 80 mg tablet  Clotrimazole 1 % cream  Fenofibrate  Garlique  Icosapent Ethyl  Lasix  Nexlizet  Nitroglycerin 0.4 mg tablet, sublingual  Xyzal     GU PSH: Cysto Bladder Stone <2.5cm - 2021 Cysto Dilate Stricture (M or F) - 2021 Locm 300-'399Mg'$ /Ml Iodine,1Ml - 2021 PLACE RT DEVICE/MARKER, PROS - 2021 Prostate Needle Biopsy - 2021, 2019, 2012 Transperineal Plmt Biodegradable Matrl 1/Mlt Njx - 2021       PSH Notes: CABG (CABG), Biopsy Of The Prostate Needle, Leg Repair   Heart Value replacement Jue 2023    NON-GU PSH: CABG (coronary artery bypass grafting) - 2016 Surgical Pathology, Gross And Microscopic Examination For Prostate Needle - 2021, 2019     GU PMH: BPH w/LUTS - 10/21/2021, - 04/14/2021, - 2022, - 2021, - 2021 Bulbar urethral stricture - 10/21/2021, - 2022, - 2021, - 2021 Incomplete bladder emptying - 10/21/2021, - 04/14/2021, - 2021 Prostate Cancer - 10/21/2021, - 04/14/2021, - 2022, - 2021, - 2021, - 2021, - 2021, - 2021, - 2021, Although his prostate was free of any new nodularity or induration his PSA has increased to 10.6 which is higher than it has ever been in the past. We therefore discussed further evaluation with a prostate MRI. I did discuss fusion biopsy as an option depending on the results of his MRI scan and told him I would contact him with the results of the study and my recommendations., - 2021 (Stable), His prostate remains benign. His current PSA is 6.75 which is down from where was 6 months ago. His PSA has varied over time. At this point I have recommended continued active surveillance with repeat DRE and PSA again in 6 months., - 2020 (Stable), His DRE reveals no abnormality. His PSA is 7.67. I will continue active surveillance with repeat DRE and PSA again in 6 months., - 2020 (Stable), He has elected to proceed with continued active surveillance and will return in 6 months for repeat DRE and PSA., - 2019 (Stable), I have discussed with the patient the possibility of blood per rectum, per urethra and in the ejaculate. He was counseled to contact me if  he has any difficulties following his prostate biopsy whatsoever., - 2019 (Stable), His prostate is smooth and benign. His PSA is 5.53. We discussed the fact that it has been 7 years since his prostate biopsy and his PSA has remained stable but he has not undergone a repeat biopsy. We discussed the options and he has elected to proceed with an MRI scan understanding that if potentially high-grade lesions are found this would allow  for fusion biopsy to target the lesions more accurately., - 2019 (Stable), His prostate remains smooth. His PSA has gone up slightly. We discussed the fact that it has been higher in the past and I told him that if he should show a continued trend upward I would recommend a repeat biopsy. I will otherwise plan to see him back in 6 months for another DRE and PSA at that time., - 2018 Nocturia - 2022 Urinary Retention - 03/26/2020, - 2021 Bladder Stone - 2021, - 2021 BPH w/o LUTS (Stable), His prostate is enlarged to exam but is smooth and he has no significant voiding symptoms at this time. - 2018 ED due to arterial insufficiency, He wanted to consider sildenafil but will contact me if he would like to proceed with that. - 2017 Renal calculus, Renal calculus, bilateral - 2017 Renal cyst, Bilateral renal cysts - 2017 Urethral Stricture, Unspec, Urethral stricture - 2017      PMH Notes:   Melvin Dunn was followed by Dr. Karsten Ro until his retirement. I assumed his care in 2021.   1) Prostate cancer: He is s/p treatment for clinical stage T3a N0 M0, Gleason 4+3=7 adenocarcinoma with EBRT and long term ADT under the care of Dr. Tammi Klippel completed in January 2022.   Pretreatment PSA: 10.6  PSA nadir:   2) Nephrolithiasis: He was found to have bilateral nonobstructing renal calculi (2 in the right kidney and 4 in the left kidney) by CT scan in 3/17.   3) Microscopic hematuria: He was found to have 3-10 rbc's/hpf on UA in 2/17. He underwent evaluation with cystoscopy and CT scan. His CT scan revealed bilateral renal calculi and bilateral renal cysts but no worrisome lesions. Cystoscopically I found a mild bulbar urethral stricture that was dilated with the cystoscope.   4) Bulbar urethral stricture. He was found to have a mild bulbar urethral stricture, cystoscopy for microscopic hematuria in 3/17. This was dilated with the cystoscope and no further therapy was necessary.   Nov 2021: Balloon dilation of  urethral stricture prior to radiation   5) Bladder calculi: He is s/p cystolithalopaxy in November 2021 prior to radiation therapy.     NON-GU PMH: Asthma, Asthma - 2014 Personal history of other diseases of the respiratory system, History of allergic rhinitis - 2014 Personal history of other endocrine, nutritional and metabolic disease, History of hyperlipidemia - 2014    FAMILY HISTORY: Dementia - Runs In Family Family Health Status Number - Runs In Family Father Deceased At [place] ___ - Runs In Family   SOCIAL HISTORY: Marital Status: Married Preferred Language: English; Ethnicity: Not Hispanic Or Latino; Race: White Current Smoking Status: Patient has never smoked.  Has never drank.  Drinks 3 caffeinated drinks per day.     Notes: Drug Use, Alcohol Use, Never A Smoker, Marital History - Currently Married   REVIEW OF SYSTEMS:    GU Review Male:   Patient reports frequent urination, hard to postpone urination, and get up at night to urinate. Patient denies burning/ pain with urination, leakage  of urine, stream starts and stops, trouble starting your streams, and have to strain to urinate .  Gastrointestinal (Lower):   Patient denies diarrhea and constipation.  Gastrointestinal (Upper):   Patient denies nausea and vomiting.  Constitutional:   Patient denies fever, night sweats, weight loss, and fatigue.  Skin:   Patient denies skin rash/ lesion and itching.  Eyes:   Patient denies blurred vision and double vision.  Ears/ Nose/ Throat:   Patient denies sore throat and sinus problems.  Hematologic/Lymphatic:   Patient denies swollen glands and easy bruising.  Cardiovascular:   Patient denies leg swelling and chest pains.  Respiratory:   Patient denies cough and shortness of breath.  Endocrine:   Patient denies excessive thirst.  Musculoskeletal:   Patient denies back pain and joint pain.  Neurological:   Patient denies headaches and dizziness.  Psychologic:   Patient denies anxiety  and depression.   VITAL SIGNS:      03/18/2022 02:15 PM  BP 146/81 mmHg  Pulse 70 /min  Temperature 98.6 F / 37 C   MULTI-SYSTEM PHYSICAL EXAMINATION:    Constitutional: Well-nourished. No physical deformities. Normally developed. Good grooming.  Respiratory: No labored breathing, no use of accessory muscles.   Cardiovascular: Normal temperature, normal extremity pulses, no swelling, no varicosities.  Gastrointestinal: No mass, no tenderness, no rigidity, non obese abdomen. No CVA tenderness.     Complexity of Data:  Records Review:   Previous Patient Records  X-Ray Review: KUB: Reviewed Films.  C.T. Abdomen/Pelvis: Reviewed Films.     10/09/21 04/07/21 07/30/20 06/21/19 12/27/18 07/04/18 10/27/17 04/21/17  PSA  Total PSA <0.02 ng/mL <0.015 ng/mL 0.018 ng/mL 10.60 ng/mL 6.75 ng/mL 7.67 ng/mL 5.53 ng/mL 6.95 ng/mL    10/09/21 04/07/21 07/30/20  Hormones  Testosterone, Total <10 ng/dL <10 ng/dL <10 ng/dL   Notes:                     CLINICAL DATA: Right-sided flank/abdominal pain.   EXAM:  CT ABDOMEN AND PELVIS WITH CONTRAST   TECHNIQUE:  Multidetector CT imaging of the abdomen and pelvis was performed  using the standard protocol following bolus administration of  intravenous contrast.   RADIATION DOSE REDUCTION: This exam was performed according to the  departmental dose-optimization program which includes automated  exposure control, adjustment of the mA and/or kV according to  patient size and/or use of iterative reconstruction technique.   CONTRAST: 52m OMNIPAQUE IOHEXOL 350 MG/ML SOLN   COMPARISON: MR pelvis July 23, 2019 and CT abdomen pelvis November 28, 2019.   FINDINGS:  Lower chest: No acute abnormality   Hepatobiliary: Stable 17 mm cyst in the left lobe of the liver.  Additional hypodense hepatic lesions are also stable and technically  too small to accurately characterize but statistically likely to  reflect benign cysts or hemangiomas. Gallbladder is  unremarkable. No  biliary ductal dilation.   Pancreas: No pancreatic ductal dilation or evidence of acute  inflammation.   Spleen: No splenomegaly.   Adrenals/Urinary Tract: Bilateral adrenal glands appear normal.   Delayed right nephrogram with hydroureteronephrosis to a 6 mm stone  in the distal ureter near the pelvic inlet. Additionally there is  perinephric and periureteric stranding with some urothelial  hyperenhancement of the ureter possibly reactive/sequela of back  pressure however would suggest correlation with laboratory values  for superimposed infection.   Additional nonobstructive bilateral renal calculi measure up to 4 mm   Stable left renal cysts which are considered  benign and require no  independent imaging follow-up. Bilateral hypodense renal lesions are  technically too small to accurately characterize but statistically  likely to reflect cysts, these are also considered benign and  require no independent imaging follow-up   Stomach/Bowel: Stomach is unremarkable for degree of distension. No  pathologic dilation of small or large bowel. Appendix is not  confidently identified in its entirety however there are no findings  to suggest acute appendicitis. Scattered colonic diverticulosis  without findings of acute diverticulitis   Vascular/Lymphatic: Stomach is unremarkable for degree of  distension. No pathologic dilation of small or large bowel.  Scattered colonic diverticulosis without findings of acute  diverticulitis.   Reproductive: Fiducial markers in the prostate gland.   Other: Trace free fluid along the distal aspect of the right  pericolic gutter is in continuity with the perinephric stranding and  favored to reflect a component of that process. No walled off fluid  collections. No pneumoperitoneum.   Musculoskeletal: No acute osseous abnormality.   IMPRESSION:  1. Delayed right nephrogram with hydroureteronephrosis to a 6 mm  stone in the  distal ureter near the pelvic inlet. Additionally there  is perinephric and periureteric stranding with some urothelial  hyperenhancement, suggest correlation with laboratory values for  superimposed infection.  2. Additional nonobstructive bilateral renal calculi measure up to 4  mm.  3. Colonic diverticulosis without findings of acute diverticulitis.    Electronically Signed  By: Dahlia Bailiff M.D.  On: 03/12/2022 07:35   I independently reviewed his KUB x-ray from today. This demonstrates stable pelvic phleboliths. He does appear to have a calcification over the lower pelvic inlet consistent with his known 6 mm ureteral calculus.   PROCEDURES:         KUB - [id number]  A single view of the abdomen is obtained.      Patient confirmed No Neulasta OnPro Device.           Urinalysis - 81003 Dipstick Dipstick Cont'd  Specimen: Voided Bilirubin: Neg  Color: Yellow Ketones: Neg  Appearance: Clear Blood: Neg  Specific Gravity: 1.015 Protein: Neg  pH: 6.0 Urobilinogen: 0.2  Glucose: Neg Nitrites: Neg    Leukocyte Esterase: Neg    Notes:      ASSESSMENT:      ICD-10 Details  1 GU:   Ureteral calculus - N20.1    PLAN:            Medications New Meds: Tamsulosin Hcl 0.4 mg capsule 1 capsule PO Q HS   #14  0 Refill(s)  Pharmacy Name:  [id number] Address:  [address] [place] [address] Phone:  [phone] Fax:  [phone]           Orders X-Rays: KUB          Schedule Return Visit/Planned Activity: Other See Visit Notes             Note: Will call to schedule surgery.          Document Letter(s):  Created for Patient: Clinical Summary         Notes:   1. Locally advanced prostate cancer: He will keep his scheduled appointment in January.   2. BPH/LUTS: He will continue observation with plans to check his PVR as scheduled periodically.   3. Right ureteral calculus: He has previously not responded well to shockwave lithotripsy. As  such, we focused our discussion on ureteroscopic laser lithotripsy. He will initially  proceed with medical expulsion therapy and has been provided a new prescription for tamsulosin today. We will tentatively schedule him for 1 to 2 weeks for cystoscopy, right retrograde pyelography, right ureteroscopic laser lithotripsy, right ureteral stent placement. We have reviewed the potential risks and complications of this procedure and the expected recovery process. He will notify me if he passes a stone prior to that appointment or if he develops uncontrolled pain, fever, or persistent nausea/vomiting.   CC: Dr. Azalia Bilis        Next Appointment:      Next Appointment: 05/21/2022 07:00 AM    Appointment Type: Laboratory Appointment    Location: [facility] [phone]    Provider: Lab LAB    Reason for Visit: 6 mo psa      * Signed by Raynelle Bring, M.D. on 03/19/22 at 7:35 AM (EDT*

## 2022-04-01 ENCOUNTER — Encounter (HOSPITAL_COMMUNITY)
Admission: RE | Disposition: A | Discharge status: Home / Self Care | Payer: Self-pay | Source: Ambulatory Visit | Attending: Urology

## 2022-04-01 ENCOUNTER — Ambulatory Visit (HOSPITAL_COMMUNITY): Payer: Medicare Other | Admitting: Emergency Medicine

## 2022-04-01 ENCOUNTER — Encounter (HOSPITAL_COMMUNITY): Payer: Self-pay | Admitting: Urology

## 2022-04-01 ENCOUNTER — Ambulatory Visit (HOSPITAL_COMMUNITY)
Admission: RE | Admit: 2022-04-01 | Discharge: 2022-04-01 | Disposition: A | Discharge status: Home / Self Care | Payer: Medicare Other | Source: Ambulatory Visit | Attending: Urology | Admitting: Urology

## 2022-04-01 ENCOUNTER — Ambulatory Visit (HOSPITAL_COMMUNITY): Payer: Medicare Other

## 2022-04-01 ENCOUNTER — Ambulatory Visit (HOSPITAL_BASED_OUTPATIENT_CLINIC_OR_DEPARTMENT_OTHER): Payer: Medicare Other | Admitting: Anesthesiology

## 2022-04-01 DIAGNOSIS — C61 Malignant neoplasm of prostate: Secondary | ICD-10-CM | POA: Diagnosis not present

## 2022-04-01 DIAGNOSIS — I2510 Atherosclerotic heart disease of native coronary artery without angina pectoris: Secondary | ICD-10-CM | POA: Diagnosis not present

## 2022-04-01 DIAGNOSIS — N401 Enlarged prostate with lower urinary tract symptoms: Secondary | ICD-10-CM | POA: Insufficient documentation

## 2022-04-01 DIAGNOSIS — N35911 Unspecified urethral stricture, male, meatal: Secondary | ICD-10-CM

## 2022-04-01 DIAGNOSIS — N201 Calculus of ureter: Secondary | ICD-10-CM

## 2022-04-01 DIAGNOSIS — J45909 Unspecified asthma, uncomplicated: Secondary | ICD-10-CM | POA: Insufficient documentation

## 2022-04-01 DIAGNOSIS — E119 Type 2 diabetes mellitus without complications: Secondary | ICD-10-CM

## 2022-04-01 DIAGNOSIS — I509 Heart failure, unspecified: Secondary | ICD-10-CM | POA: Insufficient documentation

## 2022-04-01 DIAGNOSIS — I110 Hypertensive heart disease with heart failure: Secondary | ICD-10-CM

## 2022-04-01 DIAGNOSIS — Z951 Presence of aortocoronary bypass graft: Secondary | ICD-10-CM | POA: Insufficient documentation

## 2022-04-01 DIAGNOSIS — Z538 Procedure and treatment not carried out for other reasons: Secondary | ICD-10-CM | POA: Diagnosis not present

## 2022-04-01 DIAGNOSIS — Z952 Presence of prosthetic heart valve: Secondary | ICD-10-CM | POA: Insufficient documentation

## 2022-04-01 HISTORY — PX: CYSTOSCOPY/URETEROSCOPY/HOLMIUM LASER/STENT PLACEMENT: SHX6546

## 2022-04-01 LAB — GLUCOSE, CAPILLARY
Glucose-Capillary: 162 mg/dL — ABNORMAL HIGH (ref 70–99)
Glucose-Capillary: 140 mg/dL — ABNORMAL HIGH (ref 70–99)

## 2022-04-01 SURGERY — CYSTOSCOPY/URETEROSCOPY/HOLMIUM LASER/STENT PLACEMENT
Anesthesia: General | Site: Urethra | Laterality: Right

## 2022-04-01 MED ORDER — FENTANYL CITRATE (PF) 100 MCG/2ML IJ SOLN
INTRAMUSCULAR | Status: AC
  Filled 2022-04-01: qty 2

## 2022-04-01 MED ORDER — OXYCODONE HCL 5 MG PO TABS: 5.0000 mg | ORAL_TABLET | Freq: Once | ORAL | Status: DC | PRN

## 2022-04-01 MED ORDER — CHLORHEXIDINE GLUCONATE 0.12 % MT SOLN
15.0000 mL | Freq: Once | OROMUCOSAL | Status: AC
  Administered 2022-04-01: 15 mL via OROMUCOSAL

## 2022-04-01 MED ORDER — ONDANSETRON HCL 4 MG/2ML IJ SOLN
INTRAMUSCULAR | Status: AC
  Filled 2022-04-01: qty 2

## 2022-04-01 MED ORDER — ONDANSETRON HCL 4 MG/2ML IJ SOLN
INTRAMUSCULAR | Status: DC | PRN
  Administered 2022-04-01: 4 mg via INTRAVENOUS

## 2022-04-01 MED ORDER — ONDANSETRON HCL 4 MG/2ML IJ SOLN: 4.0000 mg | Freq: Once | INTRAMUSCULAR | Status: DC | PRN

## 2022-04-01 MED ORDER — SODIUM CHLORIDE 0.9 % IR SOLN
Status: DC | PRN
  Administered 2022-04-01: 3000 mL

## 2022-04-01 MED ORDER — AMISULPRIDE (ANTIEMETIC) 5 MG/2ML IV SOLN: 10.0000 mg | Freq: Once | INTRAVENOUS | Status: DC | PRN

## 2022-04-01 MED ORDER — IOHEXOL 300 MG/ML  SOLN
INTRAMUSCULAR | Status: DC | PRN
  Administered 2022-04-01: 23 mL via URETHRAL
  Administered 2022-04-01: 50 mL via URETHRAL

## 2022-04-01 MED ORDER — DEXAMETHASONE SODIUM PHOSPHATE 10 MG/ML IJ SOLN
INTRAMUSCULAR | Status: AC
  Filled 2022-04-01: qty 1

## 2022-04-01 MED ORDER — LACTATED RINGERS IV SOLN: INTRAVENOUS | Status: DC

## 2022-04-01 MED ORDER — FENTANYL CITRATE (PF) 100 MCG/2ML IJ SOLN
INTRAMUSCULAR | Status: DC | PRN
  Administered 2022-04-01 (×2): 50 ug via INTRAVENOUS

## 2022-04-01 MED ORDER — MIDAZOLAM HCL 2 MG/2ML IJ SOLN
INTRAMUSCULAR | Status: AC
  Filled 2022-04-01: qty 2

## 2022-04-01 MED ORDER — OXYCODONE HCL 5 MG/5ML PO SOLN: 5.0000 mg | Freq: Once | ORAL | Status: DC | PRN

## 2022-04-01 MED ORDER — CEFAZOLIN SODIUM-DEXTROSE 2-4 GM/100ML-% IV SOLN
2.0000 g | INTRAVENOUS | Status: AC
  Administered 2022-04-01: 2 g via INTRAVENOUS
  Filled 2022-04-01: qty 100

## 2022-04-01 MED ORDER — LIDOCAINE HCL (PF) 2 % IJ SOLN
INTRAMUSCULAR | Status: AC
  Filled 2022-04-01: qty 5

## 2022-04-01 MED ORDER — DEXAMETHASONE SODIUM PHOSPHATE 10 MG/ML IJ SOLN
INTRAMUSCULAR | Status: DC | PRN
  Administered 2022-04-01: 10 mg via INTRAVENOUS

## 2022-04-01 MED ORDER — FENTANYL CITRATE PF 50 MCG/ML IJ SOSY: 25.0000 ug | PREFILLED_SYRINGE | INTRAMUSCULAR | Status: DC | PRN

## 2022-04-01 MED ORDER — ACETAMINOPHEN 500 MG PO TABS
1000.0000 mg | ORAL_TABLET | Freq: Once | ORAL | Status: AC
  Administered 2022-04-01: 1000 mg via ORAL
  Filled 2022-04-01: qty 2

## 2022-04-01 MED ORDER — PROPOFOL 10 MG/ML IV BOLUS
INTRAVENOUS | Status: DC | PRN
  Administered 2022-04-01: 100 mg via INTRAVENOUS

## 2022-04-01 MED ORDER — PHENYLEPHRINE 80 MCG/ML (10ML) SYRINGE FOR IV PUSH (FOR BLOOD PRESSURE SUPPORT)
PREFILLED_SYRINGE | INTRAVENOUS | Status: DC | PRN
  Administered 2022-04-01: 160 ug via INTRAVENOUS
  Administered 2022-04-01: 80 ug via INTRAVENOUS

## 2022-04-01 MED ORDER — ORAL CARE MOUTH RINSE: 15.0000 mL | Freq: Once | OROMUCOSAL | Status: AC

## 2022-04-01 MED ORDER — MIDAZOLAM HCL 5 MG/5ML IJ SOLN
INTRAMUSCULAR | Status: DC | PRN
  Administered 2022-04-01: 2 mg via INTRAVENOUS

## 2022-04-01 SURGICAL SUPPLY — 23 items
BAG COUNTER SPONGE SURGICOUNT (BAG) IMPLANT
BAG SPNG CNTER NS LX DISP (BAG)
BAG URO CATCHER STRL LF (MISCELLANEOUS) ×2 IMPLANT
BASKET ZERO TIP NITINOL 2.4FR (BASKET) IMPLANT
BSKT STON RTRVL ZERO TP 2.4FR (BASKET)
CATH URETL OPEN END 6FR 70 (CATHETERS) IMPLANT
CLOTH BEACON ORANGE TIMEOUT ST (SAFETY) ×2 IMPLANT
GLOVE SURG LX STRL 7.5 STRW (GLOVE) ×2 IMPLANT
GOWN STRL REUS W/ TWL XL LVL3 (GOWN DISPOSABLE) ×2 IMPLANT
GOWN STRL REUS W/TWL XL LVL3 (GOWN DISPOSABLE) ×1
GUIDEWIRE STR DUAL SENSOR (WIRE) ×2 IMPLANT
GUIDEWIRE ZIPWRE .038 STRAIGHT (WIRE) IMPLANT
IV NS 1000ML (IV SOLUTION) ×1
IV NS 1000ML BAXH (IV SOLUTION) ×2 IMPLANT
KIT TURNOVER KIT A (KITS) IMPLANT
LASER FIB FLEXIVA PULSE ID 365 (Laser) IMPLANT
MANIFOLD NEPTUNE II (INSTRUMENTS) ×2 IMPLANT
PACK CYSTO (CUSTOM PROCEDURE TRAY) ×2 IMPLANT
SHEATH NAVIGATOR HD 12/14X36 (SHEATH) IMPLANT
TRACTIP FLEXIVA PULS ID 200XHI (Laser) IMPLANT
TRACTIP FLEXIVA PULSE ID 200 (Laser)
TUBING CONNECTING 10 (TUBING) ×2 IMPLANT
TUBING UROLOGY SET (TUBING) ×2 IMPLANT

## 2022-04-01 NOTE — Op Note (Signed)
Preoperative diagnosis: Right ureteral calculus  Postoperative diagnosis: Right ureteral calculus, meatal stenosis  Procedures: 1.  Cystoscopy 2.  Right retrograde pyelography with interpretation 3.  Right ureteroscopy 4.  Urethral dilation  Surgeon: Pryor Curia MD  Anesthesia: General  Complications: None  EBL: Minimal  Specimens: None  Intraoperative findings: A right retrograde pyelogram was performed with a 6 French ureteral catheter and Omnipaque contrast.  This revealed a normal distal right ureter up to the level of the iliac vessels where a significant filling defect was identified consistent with the patient's known stone.  There was noted to be some contrast past the stone with significantly dilated ureter above the stone.  No other abnormalities were noted.  The proximal ureter and renal collecting system were not able to be completely filled out.  Indication: Melvin Dunn is a 71 year old gentleman who recently presented with a 6 mm right ureteral calculus.  He has been unable to pass his stone and presents today for definitive ureteroscopic treatment.  The potential risks, complications, and expected recovery process of the planned procedure was discussed in detail.  Informed consent was obtained.  Description of procedure: The patient was taken the operating room and general anesthetic was administered.  He was given preoperative antibiotics, placed in the dorsolithotomy position, and prepped and draped in usual sterile fashion.  Next, a preoperative timeout was performed.  Cystourethroscopy was attempted to be performed but the patient was noted to have significant meatal stenosis.  His meatus was dilated with male sounds from 60 [place] up to 87 [place].  This allowed the 22 French cystoscope sheath to be passed without difficulty.  Inspection of the bladder revealed no stones, bladder tumors, or other abnormalities.  Attention then turned to the right ureteral orifice.   This was intubated with a 6 French ureteral catheter.  Omnipaque contrast was injected.  The distal ureter appeared normal up to the level of the iliac vessels where a significant filling defect was identified consistent with the patient's known stone.  A 0.38 sensor guidewire was then advanced up to the level of the obstruction but was unable to bypass the obstruction.  After multiple attempts under fluoroscopy, a 6 French semirigid ureteroscope was advanced up next to the wire and the stone was unable to be visualized.  Attempts to place a Glidewire through the ureteroscope was still unsuccessful and bypassing the obstruction.  After multiple attempts to better visualize the stone and to manipulate a wire past the obstructing stone, it was felt that this was futile.  The procedure was therefore abandoned.  The patient's findings are consistent with an impacted ureteral stone.  His bladder was emptied.  He was able to be awakened and transferred to recovery unit in satisfactory condition.  We will plan to consider percutaneous access to the kidney with an antegrade procedure for the next step.

## 2022-04-01 NOTE — Anesthesia Procedure Notes (Signed)
Procedure Name: LMA Insertion Date/Time: 04/01/2022 10:52 AM  Performed by: Pilar Grammes, CRNAPre-anesthesia Checklist: Patient identified, Emergency Drugs available, Suction available, Patient being monitored and Timeout performed Patient Re-evaluated:Patient Re-evaluated prior to induction Oxygen Delivery Method: Circle system utilized Preoxygenation: Pre-oxygenation with 100% oxygen Induction Type: IV induction Ventilation: Mask ventilation without difficulty LMA: LMA inserted LMA Size: 5.0 Number of attempts: 1 Tube secured with: Tape

## 2022-04-01 NOTE — Discharge Instructions (Addendum)
You may see some blood in the urine and may have some burning with urination for 48-72 hours. You also may notice that you have to urinate more frequently or urgently after your procedure which is normal.  You should call should you develop an inability urinate, fever > 101, persistent nausea and vomiting that prevents you from eating or drinking to stay hydrated.    

## 2022-04-01 NOTE — Transfer of Care (Signed)
Immediate Anesthesia Transfer of Care Note  Patient: Melvin Dunn.  Procedure(s) Performed: CYSTOSCOPY/RIGHT URETEROSCOP/RIGHT RETROGRADE PYELOGRAM (Right: Urethra)  Patient Location: PACU  Anesthesia Type:General  Level of Consciousness: awake, drowsy, and patient cooperative  Airway & Oxygen Therapy: Patient connected to face mask oxygen  Post-op Assessment: Report given to RN and Post -op Vital signs reviewed and stable  Post vital signs: stable  Last Vitals:  Vitals Value Taken Time  BP 129/56 04/01/22 1131  Temp    Pulse 56 04/01/22 1133  Resp 19 04/01/22 1133  SpO2 100 % 04/01/22 1133  Vitals shown include unvalidated device data.  Last Pain:  Vitals:   04/01/22 0914  TempSrc:   PainSc: 0-No pain         Complications: No notable events documented.

## 2022-04-01 NOTE — Anesthesia Postprocedure Evaluation (Signed)
Anesthesia Post Note  Patient: Melvin Dunn.  Procedure(s) Performed: CYSTOSCOPY/RIGHT URETEROSCOP/RIGHT RETROGRADE PYELOGRAM (Right: Urethra)     Patient location during evaluation: PACU Anesthesia Type: General Level of consciousness: awake and alert Pain management: pain level controlled Vital Signs Assessment: post-procedure vital signs reviewed and stable Respiratory status: spontaneous breathing, nonlabored ventilation and respiratory function stable Cardiovascular status: blood pressure returned to baseline and stable Postop Assessment: no apparent nausea or vomiting Anesthetic complications: no   No notable events documented.  Last Vitals:  Vitals:   04/01/22 1200 04/01/22 1215  BP: (!) 130/59 (!) 138/53  Pulse: (!) 56 (!) 55  Resp: 19 18  Temp:  (!) 36.4 C  SpO2: 99% 99%    Last Pain:  Vitals:   04/01/22 1215  TempSrc:   PainSc: 0-No pain                 Lidia Collum

## 2022-04-01 NOTE — Interval H&P Note (Signed)
History and Physical Interval Note:  04/01/2022 10:17 AM  Melvin Dunn.  has presented today for surgery, with the diagnosis of RIGHT URETERAL CALCULUS.  The various methods of treatment have been discussed with the patient and family. After consideration of risks, benefits and other options for treatment, the patient has consented to  Procedure(s) with comments: CYSTOSCOPY/RIGHT URETEROSCOPY/HOLMIUM LASER/RIGHT RETROGRADE PYELOGRAM/RIGHT URETERAL STENT PLACEMENT (Right) - 60 MINUTES NEEDED FOR CASE as a surgical intervention.  The patient's history has been reviewed, patient examined, no change in status, stable for surgery.  I have reviewed the patient's chart and labs.  Questions were answered to the patient's satisfaction.     [employer]

## 2022-04-01 NOTE — Interval H&P Note (Signed)
History and Physical Interval Note:  04/01/2022 10:20 AM  Melvin Dunn.  has presented today for surgery, with the diagnosis of RIGHT URETERAL CALCULUS.  The various methods of treatment have been discussed with the patient and family. After consideration of risks, benefits and other options for treatment, the patient has consented to  Procedure(s) with comments: CYSTOSCOPY/RIGHT URETEROSCOPY/HOLMIUM LASER/RIGHT RETROGRADE PYELOGRAM/RIGHT URETERAL STENT PLACEMENT (Right) - 60 MINUTES NEEDED FOR CASE as a surgical intervention.  The patient's history has been reviewed, patient examined, no change in status, stable for surgery.  I have reviewed the patient's chart and labs.  Questions were answered to the patient's satisfaction.     [employer]

## 2022-04-02 ENCOUNTER — Encounter (HOSPITAL_COMMUNITY): Payer: Self-pay | Admitting: Urology

## 2022-04-05 ENCOUNTER — Other Ambulatory Visit (HOSPITAL_COMMUNITY): Payer: Self-pay | Admitting: Urology

## 2022-04-05 DIAGNOSIS — N201 Calculus of ureter: Secondary | ICD-10-CM

## 2022-04-15 ENCOUNTER — Other Ambulatory Visit: Payer: Self-pay | Admitting: Radiology

## 2022-04-15 DIAGNOSIS — N201 Calculus of ureter: Secondary | ICD-10-CM

## 2022-04-15 NOTE — Consult Note (Signed)
Chief Complaint: Patient was seen in consultation today for right percutaneous nephrostomy with possible ureteral stent placement  Referring Physician(s): [place]  Supervising Physician: Corrie Mckusick  Patient Status: [facility] ([facility]) - Out-pt  History of Present Illness: Melvin Dunn. is a 71 y.o. male with PMH sig for asthma, CAD with CABG, CHF, HLD, HTN, prediabetes, prostate cancer, aortic stenosis with TAVR, seizure due to lidocaine allergy with prostate bx 2021 and nephrolithiasis. CT A/P on 03/12/22 for evaluation of flank pain revealed:   1. Delayed right nephrogram with hydroureteronephrosis to a 6 mm stone in the distal ureter near the pelvic inlet. Additionally there is perinephric and periureteric stranding with some urothelial hyperenhancement, suggest correlation with laboratory values for superimposed infection. 2. Additional nonobstructive bilateral renal calculi measure up to 4 mm. 3. Colonic diverticulosis without findings of acute diverticulitis.  He underwent cystoscopy with rt retrograde pyelography/ureteroscopy and urethral dilation on 04/01/22 for rt ureteral calculus which pt could not pass. Pt was noted to have a meatal stenosis. The distal ureter appeared normal up to the level of the iliac vessels where a significant filling defect was identified consistent with the patient's known stone. A 0.38 sensor guidewire was then advanced up to the level of the obstruction but was unable to bypass the obstruction. After multiple attempts under fluoroscopy, a 6 French semirigid ureteroscope was advanced up next to the wire and the stone was unable to be visualized. Attempts to place a Glidewire through the ureteroscope was still unsuccessful and bypassing the obstruction. After multiple attempts to better visualize the stone and to manipulate a wire past the obstructing stone, it was felt that this was futile . He now presents for right percutaneous nephrostomy with possible  JJ stent placement.    Past Medical History:  Diagnosis Date   Asthma    no current inhaler use   CAD (coronary artery disease), native coronary artery    LIMA to LAD, SVG to OM1, SVG to RCA-RPL, EVH via right thigh and leg   CHF (congestive heart failure) (HCC)    Colon polyp    Environmental allergies    History of kidney stones    Hyperlipidemia    Hypertension    Pre-diabetes    Prostate cancer ([place])    adenocarcimona, of the prostate, 5% of single core 01/2011-watching and waiting for now   S/P TAVR (transcatheter aortic valve replacement) 10/13/2021   s/p TAVR with a 63m Edwards S3UR via the left carotid approach by Dr. TAli Lowe& Dr. BCyndia Bent  Seizure ([facility]    due to lidocaine allergy with prostate biopsy this year   Severe aortic stenosis     Past Surgical History:  Procedure Laterality Date   CORONARY ARTERY BYPASS GRAFT N/A 02/13/2014   Procedure: CORONARY ARTERY BYPASS GRAFTING (CABG) TIMES FOUR USING LEFT INTERNAL MAMMARY ARTERY AND RIGHT SAPHENOUS LEG VEIN HARVESTED ENDOSCOPICALLY;  Surgeon: CRexene Alberts MD;  [place]  Service: Open Heart Surgery;  Laterality: N/A;   CYSTOSCOPY WITH LITHOLAPAXY N/A 03/17/2020   Procedure: CYSTOSCOPY WITH LITHOLAPAXY;  Surgeon: BRaynelle Bring MD;  [facility]  Service: Urology;  Laterality: N/A;   CYSTOSCOPY/URETEROSCOPY/HOLMIUM LASER/STENT PLACEMENT Right 04/01/2022   Procedure: CYSTOSCOPY/RIGHT URETEROSCOP/RIGHT RETROGRADE PYELOGRAM;  Surgeon: BRaynelle Bring MD;  Location: [facility];  Service: Urology;  Laterality: Right;  60 MINUTES NEEDED FOR CASE   cytoscopy  15 yrs ago   EXTRACORPOREAL SHOCK WAVE LITHOTRIPSY  15 yrs ago   EYE SURGERY Bilateral  2015   cataracts   GOLD SEED IMPLANT N/A 03/17/2020   Procedure: GOLD SEED IMPLANT;  Surgeon: Raynelle Bring, MD;  Location: [facility];  Service: Urology;  Laterality: N/A;   INTRAOPERATIVE TRANSESOPHAGEAL ECHOCARDIOGRAM N/A 02/13/2014    Procedure: INTRAOPERATIVE TRANSESOPHAGEAL ECHOCARDIOGRAM;  Surgeon: Rexene Alberts, MD;  Location: [place];  Service: Open Heart Surgery;  Laterality: N/A;   INTRAOPERATIVE TRANSESOPHAGEAL ECHOCARDIOGRAM N/A 10/13/2021   Procedure: INTRAOPERATIVE TRANSESOPHAGEAL ECHOCARDIOGRAM;  Surgeon: Early Osmond, MD;  Location: [facility];  Service: Open Heart Surgery;  Laterality: N/A;   LEFT HEART CATHETERIZATION WITH CORONARY ANGIOGRAM N/A 02/12/2014   Procedure: LEFT HEART CATHETERIZATION WITH CORONARY ANGIOGRAM;  Surgeon: Blane Ohara, MD;  Location: [facility] C[facility] LAB;  Service: Cardiovascular;  Laterality: N/A;   left leg surgery for fracture  2004   plates inserted   PROSTATE BIOPSY  06/2019   RIGHT/LEFT HEART CATH AND CORONARY/GRAFT ANGIOGRAPHY N/A 07/31/2021   Procedure: RIGHT/LEFT HEART CATH AND CORONARY/GRAFT ANGIOGRAPHY;  Surgeon: Early Osmond, MD;  Location: [place] CV LAB;  Service: Cardiovascular;  Laterality: N/A;   SPACE OAR INSTILLATION N/A 03/17/2020   Procedure: SPACE OAR INSTILLATION;  Surgeon: Raynelle Bring, MD;  Location: [facility];  Service: Urology;  Laterality: N/A;   TRANSCATHETER AORTIC VALVE REPLACEMENT, CAROTID Left 10/13/2021   Procedure: Transcatheter Aortic Valve Replacement-Carotid;  Surgeon: Early Osmond, MD;  Location: [place];  Service: Open Heart Surgery;  Laterality: Left;   ULTRASOUND GUIDANCE FOR VASCULAR ACCESS Right 10/13/2021   Procedure: ULTRASOUND GUIDANCE FOR VASCULAR ACCESS;  Surgeon: Early Osmond, MD;  Location: [place];  Service: Open Heart Surgery;  Laterality: Right;    Allergies: Lidocaine and Promethazine hcl  Medications: Prior to Admission medications   Medication Sig Start Date End Date Taking? Authorizing Provider  albuterol (VENTOLIN HFA) 108 (90 Base) MCG/ACT inhaler Inhale 1-2 puffs into the lungs every 6 (six) hours as needed for wheezing or shortness of breath.    [provider]  amoxicillin (AMOXIL)  500 MG tablet Take 4 tablets (2,000 mg total) by mouth as directed. 1 hour prior to dental work including cleanings 10/22/21   Eileen Stanford, PA-C  aspirin 81 MG chewable tablet Chew 81 mg by mouth daily.    [provider]  atorvastatin (LIPITOR) 80 MG tablet TAKE 1 TABLET BY MOUTH EVERY DAY 05/13/21   Sueanne Margarita, MD  Bempedoic Acid-Ezetimibe (NEXLIZET) 180-10 MG TABS Take 1 tablet by mouth daily. 11/23/21   Sueanne Margarita, MD  clotrimazole-betamethasone (LOTRISONE) cream Apply 1 application. topically 2 (two) times daily.    [provider]  fenofibrate (TRICOR) 145 MG tablet TAKE 1 TABLET BY MOUTH EVERY DAY 06/22/21   Lenna Sciara, NP  furosemide (LASIX) 20 MG tablet Take 1 tablet (20 mg total) by mouth daily. 11/13/21   Eileen Stanford, PA-C  Garlic (GARLIQUE PO) Take 1 capsule by mouth daily.    [provider]  icosapent Ethyl (VASCEPA) 1 g capsule Take 2 capsules (2 g total) by mouth 2 (two) times daily. 06/12/21   Sueanne Margarita, MD  levocetirizine (XYZAL) 5 MG tablet Take 5 mg by mouth every evening. 06/12/19   [provider]  metoprolol tartrate (LOPRESSOR) 50 MG tablet Take 50 mg by mouth 2 (two) times daily. 05/20/17   [provider]  nitroGLYCERIN (NITROSTAT) 0.4 MG SL tablet Place 1 tablet (0.4 mg total) under the tongue every 5 (five) minutes as  needed for chest pain. 01/24/15   Sueanne Margarita, MD  ondansetron (ZOFRAN) 4 MG tablet Take 4 mg by mouth every 6 (six) hours as needed for nausea or vomiting.    [provider]  oxyCODONE-acetaminophen (PERCOCET/ROXICET) 5-325 MG tablet Take 1 tablet by mouth every 6 (six) hours as needed for severe pain.    [provider]  tamsulosin (FLOMAX) 0.4 MG CAPS capsule Take 0.4 mg by mouth at bedtime. 03/18/22   [provider]     Family History  Problem Relation Age of Onset   Alzheimer's disease Mother    Stroke Mother    Skin cancer Mother    Cancer - Lung  Father    Cancer Father        asbestos   Heart attack Maternal Grandfather    Breast cancer Neg Hx    Colon cancer Neg Hx    Prostate cancer Neg Hx    Pancreatic cancer Neg Hx     Social History   Socioeconomic History   Marital status: Single    Spouse name: Not on file   Number of children: Not on file   Years of education: Not on file   Highest education level: Not on file  Occupational History    Comment: full time  Tobacco Use   Smoking status: Never   Smokeless tobacco: Never  Vaping Use   Vaping Use: Never used  Substance and Sexual Activity   Alcohol use: No   Drug use: No   Sexual activity: Yes  Other Topics Concern   Not on file  Social History Narrative   Not on file   Social Determinants of Health   Financial Resource Strain: Not on file  Food Insecurity: Not on file  Transportation Needs: Not on file  Physical Activity: Not on file  Stress: Not on file  Social Connections: Not on file      Review of Systems denies fever,HA,CP,dyspnea, cough, abd pain, back pain, N/V or bleeding  Vital Signs: Vitals:   04/16/22 0740  BP: (!) 152/64  Pulse: 63  Resp: 18  Temp: 98.2 F (36.8 C)  SpO2: 95%        Physical Exam awake/alert; chest- CTA bilat; heart- RRR; abd- soft,+BS,NT; trace to 1+ pretibial edema bilat  Imaging: DG C-Arm 1-60 Min-No Report  Result Date: 04/01/2022 Fluoroscopy was utilized by the requesting physician.  No radiographic interpretation.    Labs:  CBC: Recent Labs    07/24/21 1118 07/31/21 0759 10/09/21 1330 10/13/21 1148 10/13/21 1449 10/14/21 0148 03/11/22 2225  WBC 10.3  --  9.7  --   --  18.6* 15.4*  HGB 12.0*   < > 12.5* 11.6* 11.6* 10.9* 12.6*  HCT 36.9*   < > 39.3 34.0* 34.0* 33.5* 38.6*  PLT 290  --  304  --   --  255 343   < > = values in this interval not displayed.    COAGS: Recent Labs    10/09/21 1330  INR 1.2    BMP: Recent Labs    10/09/21 1330 10/13/21 1148 10/13/21 1449  10/14/21 0148 12/09/21 1558 03/11/22 2225  NA 134*   < > 140 136 142 142  K 3.7   < > 3.8 4.0 3.8 4.4  CL 102   < > 105 108 104 103  CO2 23  --   --  '22 24 25  '$ GLUCOSE 155*   < > 170* 154* 134* 172*  BUN  28*   < > '20 21 24 '$ 27*  CALCIUM 9.9  --   --  9.4 10.0 10.5*  CREATININE 1.10   < > 0.90 1.18 1.04 1.47*  GFRNONAA >60  --   --  >60  --  51*   < > = values in this interval not displayed.    LIVER FUNCTION TESTS: Recent Labs    05/19/21 0715 08/25/21 0718 10/09/21 1330 03/11/22 2225  BILITOT 0.3  --  0.3 0.8  AST 51*  --  47* 47*  ALT 34 29 32 35  ALKPHOS 71  --  72 58  PROT 5.7*  --  6.6 6.5  ALBUMIN 3.7  --  3.9 3.8    TUMOR MARKERS: No results for input(s): "AFPTM", "CEA", "CA199", "CHROMGRNA" in the last 8760 hours.  Assessment and Plan: 71 y.o. male with PMH sig for asthma, CAD with CABG, CHF, HLD, HTN, prediabetes, prostate cancer, aortic stenosis with TAVR, seizure due to lidocaine allergy with prostate bx 2021 and nephrolithiasis. CT A/P on 03/12/22 for evaluation of flank pain revealed:   1. Delayed right nephrogram with hydroureteronephrosis to a 6 mm stone in the distal ureter near the pelvic inlet. Additionally there is perinephric and periureteric stranding with some urothelial hyperenhancement, suggest correlation with laboratory values for superimposed infection. 2. Additional nonobstructive bilateral renal calculi measure up to 4 mm. 3. Colonic diverticulosis without findings of acute diverticulitis.  He underwent cystoscopy with rt retrograde pyelography/ureteroscopy and urethral dilation on 04/01/22 for rt ureteral calculus which pt could not pass. Pt was noted to have a meatal stenosis. The distal ureter appeared normal up to the level of the iliac vessels where a significant filling defect was identified consistent with the patient's known stone. A 0.38 sensor guidewire was then advanced up to the level of the obstruction but was unable to bypass the  obstruction. After multiple attempts under fluoroscopy, a 6 French semirigid ureteroscope was advanced up next to the wire and the stone was unable to be visualized. Attempts to place a Glidewire through the ureteroscope was still unsuccessful and bypassing the obstruction. After multiple attempts to better visualize the stone and to manipulate a wire past the obstructing stone, it was felt that this was futile . He now presents for right percutaneous nephrostomy with possible JJ stent placement. Risks and benefits of right PCN/possible ureteral stent placement was discussed with the patient including, but not limited to, infection, bleeding, significant bleeding causing loss or decrease in renal function or damage to adjacent structures.   All of the patient's questions were answered, patient is agreeable to proceed.  Consent signed and in chart.   LABS PENDING   Thank you for this interesting consult.  I greatly enjoyed meeting Melvin Dunn. and look forward to participating in their care.  A copy of this report was sent to the requesting provider on this date.  Electronically Signed: D. Rowe Robert, PA-C 04/15/2022, 3:59 PM   I spent a total of  25 minutes   in face to face in clinical consultation, greater than 50% of which was counseling/coordinating care for right percutaneous nephrostomy with possible ureteral stent placement

## 2022-04-16 ENCOUNTER — Ambulatory Visit (HOSPITAL_COMMUNITY)
Admission: RE | Admit: 2022-04-16 | Discharge: 2022-04-16 | Disposition: A | Discharge status: Home / Self Care | Payer: Medicare Other | Source: Ambulatory Visit | Attending: Urology | Admitting: Urology

## 2022-04-16 ENCOUNTER — Other Ambulatory Visit (HOSPITAL_COMMUNITY): Payer: Self-pay | Admitting: Urology

## 2022-04-16 ENCOUNTER — Other Ambulatory Visit: Payer: Self-pay

## 2022-04-16 DIAGNOSIS — I509 Heart failure, unspecified: Secondary | ICD-10-CM | POA: Insufficient documentation

## 2022-04-16 DIAGNOSIS — I110 Hypertensive heart disease with heart failure: Secondary | ICD-10-CM | POA: Diagnosis not present

## 2022-04-16 DIAGNOSIS — Z951 Presence of aortocoronary bypass graft: Secondary | ICD-10-CM | POA: Insufficient documentation

## 2022-04-16 DIAGNOSIS — N201 Calculus of ureter: Secondary | ICD-10-CM

## 2022-04-16 DIAGNOSIS — I350 Nonrheumatic aortic (valve) stenosis: Secondary | ICD-10-CM | POA: Insufficient documentation

## 2022-04-16 DIAGNOSIS — E785 Hyperlipidemia, unspecified: Secondary | ICD-10-CM | POA: Diagnosis not present

## 2022-04-16 DIAGNOSIS — I2510 Atherosclerotic heart disease of native coronary artery without angina pectoris: Secondary | ICD-10-CM | POA: Diagnosis not present

## 2022-04-16 DIAGNOSIS — N1330 Unspecified hydronephrosis: Secondary | ICD-10-CM | POA: Diagnosis not present

## 2022-04-16 DIAGNOSIS — N202 Calculus of kidney with calculus of ureter: Secondary | ICD-10-CM | POA: Diagnosis not present

## 2022-04-16 DIAGNOSIS — R7303 Prediabetes: Secondary | ICD-10-CM | POA: Diagnosis not present

## 2022-04-16 HISTORY — PX: IR URETERAL STENT RIGHT NEW ACCESS W/SEP NEPHROSTOMY CATH: IMG6078

## 2022-04-16 LAB — CBC WITH DIFFERENTIAL/PLATELET
Abs Immature Granulocytes: 0.02 10*3/uL (ref 0.00–0.07)
Basophils Absolute: 0.1 10*3/uL (ref 0.0–0.1)
Basophils Relative: 1 %
Eosinophils Absolute: 0.9 10*3/uL — ABNORMAL HIGH (ref 0.0–0.5)
Eosinophils Relative: 11 %
HCT: 34 % — ABNORMAL LOW (ref 39.0–52.0)
Hemoglobin: 11 g/dL — ABNORMAL LOW (ref 13.0–17.0)
Immature Granulocytes: 0 %
Lymphocytes Relative: 12 %
Lymphs Abs: 1 10*3/uL (ref 0.7–4.0)
MCH: 28.6 pg (ref 26.0–34.0)
MCHC: 32.4 g/dL (ref 30.0–36.0)
MCV: 88.3 fL (ref 80.0–100.0)
Monocytes Absolute: 0.7 10*3/uL (ref 0.1–1.0)
Monocytes Relative: 8 %
Neutro Abs: 5.4 10*3/uL (ref 1.7–7.7)
Neutrophils Relative %: 68 %
Platelets: 202 10*3/uL (ref 150–400)
RBC: 3.85 MIL/uL — ABNORMAL LOW (ref 4.22–5.81)
RDW: 14.7 % (ref 11.5–15.5)
WBC: 8.1 10*3/uL (ref 4.0–10.5)
nRBC: 0 % (ref 0.0–0.2)

## 2022-04-16 LAB — BASIC METABOLIC PANEL
Anion gap: 6 (ref 5–15)
BUN: 20 mg/dL (ref 8–23)
CO2: 26 mmol/L (ref 22–32)
Calcium: 9.2 mg/dL (ref 8.9–10.3)
Chloride: 110 mmol/L (ref 98–111)
Creatinine, Ser: 1.1 mg/dL (ref 0.61–1.24)
GFR, Estimated: >60 mL/min (ref >60)
Glucose, Bld: 154 mg/dL — ABNORMAL HIGH (ref 70–99)
Potassium: 3.7 mmol/L (ref 3.5–5.1)
Sodium: 142 mmol/L (ref 135–145)

## 2022-04-16 LAB — GLUCOSE, CAPILLARY: Glucose-Capillary: 152 mg/dL — ABNORMAL HIGH (ref 70–99)

## 2022-04-16 LAB — PROTIME-INR
INR: 1.2 (ref 0.8–1.2)
Prothrombin Time: 14.8 seconds (ref 11.4–15.2)

## 2022-04-16 MED ORDER — FENTANYL CITRATE (PF) 100 MCG/2ML IJ SOLN
INTRAMUSCULAR | Status: AC
  Filled 2022-04-16: qty 2

## 2022-04-16 MED ORDER — MIDAZOLAM HCL 2 MG/2ML IJ SOLN
INTRAMUSCULAR | Status: DC | PRN
  Administered 2022-04-16 (×2): .5 mg via INTRAVENOUS
  Administered 2022-04-16: 1 mg via INTRAVENOUS

## 2022-04-16 MED ORDER — SODIUM CHLORIDE 0.9% FLUSH: 5.0000 mL | Freq: Three times a day (TID) | INTRAVENOUS | Status: DC

## 2022-04-16 MED ORDER — SODIUM CHLORIDE 0.9 % IV SOLN
2.0000 g | Freq: Once | INTRAVENOUS | Status: AC
  Administered 2022-04-16: 2 g via INTRAVENOUS
  Filled 2022-04-16: qty 20

## 2022-04-16 MED ORDER — LIDOCAINE HCL 1 % IJ SOLN
INTRAMUSCULAR | Status: AC
  Filled 2022-04-16: qty 20

## 2022-04-16 MED ORDER — FENTANYL CITRATE (PF) 100 MCG/2ML IJ SOLN
INTRAMUSCULAR | Status: DC | PRN
  Administered 2022-04-16 (×2): 25 ug via INTRAVENOUS
  Administered 2022-04-16: 50 ug via INTRAVENOUS

## 2022-04-16 MED ORDER — ROPIVACAINE HCL 5 MG/ML IJ SOLN
INTRAMUSCULAR | Status: AC
  Administered 2022-04-16: 10 mL via INTRADERMAL
  Filled 2022-04-16: qty 30

## 2022-04-16 MED ORDER — IOHEXOL 300 MG/ML  SOLN
50.0000 mL | Freq: Once | INTRAMUSCULAR | Status: AC | PRN
  Administered 2022-04-16: 15 mL

## 2022-04-16 MED ORDER — MIDAZOLAM HCL 2 MG/2ML IJ SOLN
INTRAMUSCULAR | Status: AC
  Filled 2022-04-16: qty 2

## 2022-04-16 MED ORDER — SODIUM CHLORIDE 0.9 % IV SOLN: INTRAVENOUS | Status: DC

## 2022-04-16 NOTE — Procedures (Signed)
Interventional Radiology Procedure Note  Procedure: Image guided right PCN and right antegrade ureteral stent.  54F pigtail PCN drain. 26cm 8.5cm antegrade stent  Complications: None  EBL: None  Recommendations: - Routine PCN care, to gravity until clinic follow up. - expect some pink/bloody urine for up to a week.  If worsens or does not remit after a week, encourage call to [facility] for eval - expect some colic pain/stent pain from the new stent - will schedule for clinic visit for post visit and initiate possible capping trial in ~1-2 weeks out.  This can be at [facility] clinic, or otherwise the hospital.  - follow up with Urology/Dr. Alinda Money on schedule - routine wound care - 2 hour obs then DC home. - drain care education - advance diet now  Signed,  Dulcy Fanny. Earleen Newport, DO

## 2022-04-16 NOTE — Discharge Instructions (Addendum)
Discharge Instructions:   Please call Interventional Radiology clinic [phone] with any questions or concerns.  You may remove your dressing and do a sponge bath tomorrow.      Measure and record drainage from right nephrostomy tube bag  Showed how to empty the nephrostomy bag and enclosure including infection control with discussion  of handwashing.   Moderate Conscious Sedation, Adult, Care After This sheet gives you information about how to care for yourself after your procedure. Your health care provider may also give you more specific instructions. If you have problems or questions, contact your health care provider. What can I expect after the procedure? After the procedure, it is common to have: Sleepiness for several hours. Impaired judgment for several hours. Difficulty with balance. Vomiting if you eat too soon. Follow these instructions at home: For the time period you were told by your health care provider: Rest. Do not participate in activities where you could fall or become injured. Do not drive or use machinery. Do not drink alcohol. Do not take sleeping pills or medicines that cause drowsiness. Do not make important decisions or sign legal documents. Do not take care of children on your own. Eating and drinking  Follow the diet recommended by your health care provider. Drink enough fluid to keep your urine pale yellow. If you vomit: Drink water, juice, or soup when you can drink without vomiting. Make sure you have little or no nausea before eating solid foods. General instructions Take over-the-counter and prescription medicines only as told by your health care provider. Have a responsible adult stay with you for the time you are told. It is important to have someone help care for you until you are awake and alert. Do not smoke. Keep all follow-up visits as told by your health care provider. This is important. Contact a health care provider if: You are  still sleepy or having trouble with balance after 24 hours. You feel light-headed. You keep feeling nauseous or you keep vomiting. You develop a rash. You have a fever. You have redness or swelling around the IV site. Get help right away if: You have trouble breathing. You have new-onset confusion at home. Summary After the procedure, it is common to feel sleepy, have impaired judgment, or feel nauseous if you eat too soon. Rest after you get home. Know the things you should not do after the procedure. Follow the diet recommended by your health care provider and drink enough fluid to keep your urine pale yellow. Get help right away if you have trouble breathing or new-onset confusion at home. This information is not intended to replace advice given to you by your health care provider. Make sure you discuss any questions you have with your health care provider. Document Revised: 08/31/2019 Document Reviewed: 03/29/2019 Elsevier Patient Education  [place].

## 2022-04-16 NOTE — Progress Notes (Signed)
Wasn't told to do a fleets enema at home last night.

## 2022-04-22 DIAGNOSIS — N201 Calculus of ureter: Secondary | ICD-10-CM | POA: Diagnosis not present

## 2022-04-23 ENCOUNTER — Other Ambulatory Visit: Payer: Self-pay | Admitting: Urology

## 2022-04-27 NOTE — Progress Notes (Addendum)
COVID Vaccine Completed:  Yes  Date of COVID positive in last 90 days:  No  PCP - Shirline Frees, MD Cardiologist - Fransico Him, MD  Chest x-ray - 10-09-21 Epic EKG - 10-22-21 Epic Stress Test - 07-10-18 Epic ECHO - 11-13-21 Epic Cardiac Cath - 07-31-21 Epic Pacemaker/ICD device last checked: Spinal Cord Stimulator:   Coronary CT - 08-04-21 Epic  Bowel Prep - N/A  Sleep Study - N/A CPAP -   Fasting Blood Sugar -  Checks Blood Sugar - does not check   Last dose of GLP1 agonist-  N/A GLP1 instructions:  N/A   Last dose of SGLT-2 inhibitors-  N/A SGLT-2 instructions: N/A  Blood Thinner Instructions: Aspirin Instructions:  ASA 81.  Patient to stay on Last Dose:  Activity level:  Can go up a flight of stairs and perform activities of daily living without stopping and without symptoms of chest pain or shortness of breath.  Anesthesia review:  Hx of severe aortic stenosis s/p TAVR, CAD, CHF.  Hx of CABGx4  Hx of seizures - after prostate biopsy, thought to be related to Lidocaine.  Patient denies shortness of breath, fever, cough and chest pain at PAT appointment  Patient verbalized understanding of instructions that were given to them at the PAT appointment. Patient was also instructed that they will need to review over the PAT instructions again at home before surgery.

## 2022-04-27 NOTE — Patient Instructions (Addendum)
SURGICAL WAITING ROOM VISITATION Patients having surgery or a procedure may have no more than 2 support people in the waiting area - these visitors may rotate.   Children under the age of 33 must have an adult with them who is not the patient. If the patient needs to stay at the hospital during part of their recovery, the visitor guidelines for inpatient rooms apply. Pre-op nurse will coordinate an appropriate time for 1 support person to accompany patient in pre-op.  This support person may not rotate.    Please refer to the [facility] website for the visitor guidelines for Inpatients (after your surgery is over and you are in a regular room).      Your procedure is scheduled on: 05-03-22   Report to [facility] Main Entrance    Report to admitting at 8:35 AM   Call this number if you have problems the morning of surgery [phone]   Do not eat food or drink liquids :After Midnight.          If you have questions, please contact your surgeon's office.   FOLLOW ANY ADDITIONAL PRE OP INSTRUCTIONS YOU RECEIVED FROM YOUR SURGEON'S OFFICE!!!     Oral Hygiene is also important to reduce your risk of infection.                                    Remember - BRUSH YOUR TEETH THE MORNING OF SURGERY WITH YOUR REGULAR TOOTHPASTE   Do NOT smoke after Midnight   Take these medicines the morning of surgery with A SIP OF WATER:   Atorvastatin  Fenofibrate Metoprolol  Ondansetron if needed                              You may not have any metal on your body including jewelry, and body piercing             Do not wear  lotions, powders, cologne, or deodorant              Men may shave face and neck.   Do not bring valuables to the hospital. [place].   Contacts, dentures or bridgework may not be worn into surgery.  DO NOT [place]. PHARMACY WILL DISPENSE MEDICATIONS LISTED ON YOUR MEDICATION LIST TO YOU  DURING YOUR ADMISSION [place]!    Patients discharged on the day of surgery will not be allowed to drive home.  Someone NEEDS to stay with you for the first 24 hours after anesthesia.               Please read over the following fact sheets you were given: IF [place]  If you received a COVID test during your pre-op visit  it is requested that you wear a mask when out in public, stay away from anyone that may not be feeling well and notify your surgeon if you develop symptoms. If you test positive for Covid or have been in contact with anyone that has tested positive in the last 10 days please notify you surgeon.  [place] - Preparing for Surgery Before surgery, you can play an important role.  Because skin is not sterile, your skin needs to be as  free of germs as possible.  You can reduce the number of germs on your skin by washing with CHG (chlorahexidine gluconate) soap before surgery.  CHG is an antiseptic cleaner which kills germs and bonds with the skin to continue killing germs even after washing. Please DO NOT use if you have an allergy to CHG or antibacterial soaps.  If your skin becomes reddened/irritated stop using the CHG and inform your nurse when you arrive at Short Stay. Do not shave (including legs and underarms) for at least 48 hours prior to the first CHG shower.  You may shave your face/neck.  Please follow these instructions carefully:  1.  Shower with CHG Soap the night before surgery and the  morning of surgery.  2.  If you choose to wash your hair, wash your hair first as usual with your normal  shampoo.  3.  After you shampoo, rinse your hair and body thoroughly to remove the shampoo.                             4.  Use CHG as you would any other liquid soap.  You can apply chg directly to the skin and wash.  Gently with a scrungie or clean washcloth.  5.  Apply the CHG Soap to your body ONLY  FROM THE NECK DOWN.   Do   not use on face/ open                           Wound or open sores. Avoid contact with eyes, ears mouth and   genitals (private parts).                       Wash face,  Genitals (private parts) with your normal soap.             6.  Wash thoroughly, paying special attention to the area where your    surgery  will be performed.  7.  Thoroughly rinse your body with warm water from the neck down.  8.  DO NOT shower/wash with your normal soap after using and rinsing off the CHG Soap.                9.  Pat yourself dry with a clean towel.            10.  Wear clean pajamas.            11.  Place clean sheets on your bed the night of your first shower and do not  sleep with pets. Day of Surgery : Do not apply any lotions/deodorants the morning of surgery.  Please wear clean clothes to the hospital/surgery center.  FAILURE TO FOLLOW THESE INSTRUCTIONS MAY RESULT IN THE CANCELLATION OF YOUR SURGERY  PATIENT SIGNATURE_________________________________  NURSE SIGNATURE__________________________________  ________________________________________________________________________

## 2022-04-28 ENCOUNTER — Other Ambulatory Visit (HOSPITAL_COMMUNITY): Payer: Self-pay | Admitting: Radiology

## 2022-04-28 ENCOUNTER — Ambulatory Visit
Admission: RE | Admit: 2022-04-28 | Discharge: 2022-04-28 | Disposition: A | Discharge status: Home / Self Care | Payer: Medicare Other | Source: Ambulatory Visit | Attending: Radiology | Admitting: Radiology

## 2022-04-28 DIAGNOSIS — N201 Calculus of ureter: Secondary | ICD-10-CM

## 2022-04-28 DIAGNOSIS — Z436 Encounter for attention to other artificial openings of urinary tract: Secondary | ICD-10-CM | POA: Diagnosis not present

## 2022-04-28 DIAGNOSIS — N132 Hydronephrosis with renal and ureteral calculous obstruction: Secondary | ICD-10-CM | POA: Diagnosis not present

## 2022-04-28 MED ORDER — IOHEXOL 300 MG/ML  SOLN
5.0000 mL | Freq: Once | INTRAMUSCULAR | Status: AC | PRN
  Administered 2022-04-28: 5 mL

## 2022-04-28 NOTE — Progress Notes (Signed)
Patient ID: Melvin Guess., male   DOB: Jan 29, 1951, 71 y.o.   MRN: [id number]        Chief Complaint: Ureteral Stone  Referring Physician(s): Alinda Money  History of Present Illness: Melvin Rorke. is a 70 y.o. male with history of right-sided ureteral stone, post failed placement of a retrograde ureteral stent, subsequently post successful placement of an antegrade ureteral stent and nephrostomy catheter on 04/16/2022 Earleen Newport).  The patient presents today to the interventional radiology drain clinic for antegrade nephrostogram prior to initiation of a potential capping trial.  Note, the patient is to undergo definitive ureteral stone removal early next week.  Past Medical History:  Diagnosis Date   Asthma    no current inhaler use   CAD (coronary artery disease), native coronary artery    LIMA to LAD, SVG to OM1, SVG to RCA-RPL, EVH via right thigh and leg   CHF (congestive heart failure) (HCC)    Colon polyp    Environmental allergies    History of kidney stones    Hyperlipidemia    Hypertension    Pre-diabetes    Prostate cancer ([place])    adenocarcimona, of the prostate, 5% of single core 01/2011-watching and waiting for now   S/P TAVR (transcatheter aortic valve replacement) 10/13/2021   s/p TAVR with a 56m Edwards S3UR via the left carotid approach by Dr. TAli Lowe& Dr. BCyndia Bent  Seizure ([facility]    due to lidocaine allergy with prostate biopsy this year   Severe aortic stenosis     Past Surgical History:  Procedure Laterality Date   CORONARY ARTERY BYPASS GRAFT N/A 02/13/2014   Procedure: CORONARY ARTERY BYPASS GRAFTING (CABG) TIMES FOUR USING LEFT INTERNAL MAMMARY ARTERY AND RIGHT SAPHENOUS LEG VEIN HARVESTED ENDOSCOPICALLY;  Surgeon: CRexene Alberts MD;  [facility]  Service: Open Heart Surgery;  Laterality: N/A;   CYSTOSCOPY WITH LITHOLAPAXY N/A 03/17/2020   Procedure: CYSTOSCOPY WITH LITHOLAPAXY;  Surgeon: BRaynelle Bring MD;  [facility]   Service: Urology;  Laterality: N/A;   CYSTOSCOPY/URETEROSCOPY/HOLMIUM LASER/STENT PLACEMENT Right 04/01/2022   Procedure: CYSTOSCOPY/RIGHT URETEROSCOP/RIGHT RETROGRADE PYELOGRAM;  Surgeon: BRaynelle Bring MD;  [facility]: [facility];  Service: Urology;  Laterality: Right;  60 MINUTES NEEDED FOR CASE   cytoscopy  15 yrs ago   EXTRACORPOREAL SHOCK WAVE LITHOTRIPSY  15 yrs ago   EYE SURGERY Bilateral 2015   cataracts   GOLD SEED IMPLANT N/A 03/17/2020   Procedure: GOLD SEED IMPLANT;  Surgeon: BRaynelle Bring MD;  [facility]  Service: Urology;  Laterality: N/A;   INTRAOPERATIVE TRANSESOPHAGEAL ECHOCARDIOGRAM N/A 02/13/2014   Procedure: INTRAOPERATIVE TRANSESOPHAGEAL ECHOCARDIOGRAM;  Surgeon: CRexene Alberts MD;  [place] [facility]  Service: Open Heart Surgery;  Laterality: N/A;   INTRAOPERATIVE TRANSESOPHAGEAL ECHOCARDIOGRAM N/A 10/13/2021   Procedure: INTRAOPERATIVE TRANSESOPHAGEAL ECHOCARDIOGRAM;  Surgeon: TEarly Osmond MD;  [facility]  Service: Open Heart Surgery;  Laterality: N/A;   IR URETERAL STENT RIGHT NEW ACCESS W/SEP NEPHROSTOMY CATH  04/16/2022   LEFT HEART CATHETERIZATION WITH CORONARY ANGIOGRAM N/A 02/12/2014   Procedure: LEFT HEART CATHETERIZATION WITH CORONARY ANGIOGRAM;  Surgeon: MBlane Ohara MD;  [facility]  Service: Cardiovascular;  Laterality: N/A;   left leg surgery for fracture  2004   plates inserted   PROSTATE BIOPSY  06/2019   RIGHT/LEFT HEART CATH AND CORONARY/GRAFT ANGIOGRAPHY N/A 07/31/2021   Procedure: RIGHT/LEFT HEART CATH AND CORONARY/GRAFT ANGIOGRAPHY;  Surgeon: TEarly Osmond MD;  Location: [place] CV LAB;  Service: Cardiovascular;  Laterality: N/A;   SPACE OAR INSTILLATION N/A 03/17/2020   Procedure: SPACE OAR INSTILLATION;  Surgeon: Raynelle Bring, MD;  [facility];  Service: Urology;  Laterality: N/A;   TRANSCATHETER AORTIC VALVE REPLACEMENT, CAROTID Left 10/13/2021   Procedure:  Transcatheter Aortic Valve Replacement-Carotid;  Surgeon: Early Osmond, MD;  Location: [place];  Service: Open Heart Surgery;  Laterality: Left;   ULTRASOUND GUIDANCE FOR VASCULAR ACCESS Right 10/13/2021   Procedure: ULTRASOUND GUIDANCE FOR VASCULAR ACCESS;  Surgeon: Early Osmond, MD;  Location: [place];  Service: Open Heart Surgery;  Laterality: Right;    Allergies: Lidocaine and Promethazine hcl  Medications: Prior to Admission medications   Medication Sig Start Date End Date Taking? Authorizing Provider  albuterol (VENTOLIN HFA) 108 (90 Base) MCG/ACT inhaler Inhale 1-2 puffs into the lungs every 6 (six) hours as needed for wheezing or shortness of breath.    [provider]  amoxicillin (AMOXIL) 500 MG tablet Take 4 tablets (2,000 mg total) by mouth as directed. 1 hour prior to dental work including cleanings 10/22/21   Eileen Stanford, PA-C  aspirin 81 MG chewable tablet Chew 81 mg by mouth daily.    [provider]  atorvastatin (LIPITOR) 80 MG tablet TAKE 1 TABLET BY MOUTH EVERY DAY 05/13/21   Sueanne Margarita, MD  Bempedoic Acid-Ezetimibe (NEXLIZET) 180-10 MG TABS Take 1 tablet by mouth daily. 11/23/21   Sueanne Margarita, MD  fenofibrate (TRICOR) 145 MG tablet TAKE 1 TABLET BY MOUTH EVERY DAY 06/22/21   Lenna Sciara, NP  furosemide (LASIX) 20 MG tablet Take 1 tablet (20 mg total) by mouth daily. 11/13/21   Eileen Stanford, PA-C  icosapent Ethyl (VASCEPA) 1 g capsule Take 2 capsules (2 g total) by mouth 2 (two) times daily. 06/12/21   Sueanne Margarita, MD  imiquimod (ALDARA) 5 % cream Apply 1 Application topically 3 (three) times a week.    [provider]  levocetirizine (XYZAL) 5 MG tablet Take 5 mg by mouth every evening. 06/12/19   [provider]  metoprolol tartrate (LOPRESSOR) 50 MG tablet Take 50 mg by mouth 2 (two) times daily. 05/20/17   [provider]  nitroGLYCERIN (NITROSTAT) 0.4 MG SL tablet Place 1 tablet (0.4 mg total) under  the tongue every 5 (five) minutes as needed for chest pain. 01/24/15   Sueanne Margarita, MD  ondansetron (ZOFRAN) 4 MG tablet Take 4 mg by mouth every 6 (six) hours as needed for nausea or vomiting.    [provider]     Family History  Problem Relation Age of Onset   Alzheimer's disease Mother    Stroke Mother    Skin cancer Mother    Cancer - Lung Father    Cancer Father        asbestos   Heart attack Maternal Grandfather    Breast cancer Neg Hx    Colon cancer Neg Hx    Prostate cancer Neg Hx    Pancreatic cancer Neg Hx     Social History   Socioeconomic History   Marital status: Single    Spouse name: Not on file   Number of children: Not on file   Years of education: Not on file   Highest education level: Not on file  Occupational History    Comment: full time  Tobacco Use   Smoking status: Never   Smokeless tobacco: Never  Vaping Use   Vaping Use: Never used  Substance and Sexual Activity   Alcohol use: No   Drug use: No   Sexual activity: Yes  Other Topics Concern   Not on file  Social History Narrative   Not on file   Social Determinants of Health   Financial Resource Strain: Not on file  Food Insecurity: Not on file  Transportation Needs: Not on file  Physical Activity: Not on file  Stress: Not on file  Social Connections: Not on file    ECOG Status: 1 - Symptomatic but completely ambulatory  Review of Systems: A 12 point ROS discussed and pertinent positives are indicated in the HPI above.  All other systems are negative.  Review of Systems  Vital Signs: BP (!) 136/56 (BP Location: Right Arm, Patient Position: Sitting, Cuff Size: Normal)   Pulse 67   Temp 97.7 F (36.5 C) (Oral)   SpO2 96% Comment: room air  Physical Exam  Mallampati Score:     Imaging: IR URETERAL STENT RIGHT NEW ACCESS W/SEP NEPHROSTOMY CATH  Result Date: 04/16/2022 INDICATION: 71 year old male with a history impacted right ureteral stone, hydronephrosis,  inability to navigate retrograde via cystoscopy/ureteroscopy. He presents today for PCN and possible stent EXAM: IMAGE GUIDED RIGHT PCN IMAGE GUIDED RIGHT ANTEGRADE URETERAL STENT COMPARISON:  CT 03/12/2022 MEDICATIONS: 2 g Rocephin; The antibiotic was administered in an appropriate time frame prior to skin puncture. ANESTHESIA/SEDATION: Moderate (conscious) sedation was employed during this procedure. A total of Versed 2.0 mg and Fentanyl 100 mcg was administered intravenously by the radiology nurse. Total intra-service moderate Sedation Time: 27 minutes. The patient's level of consciousness and vital signs were monitored continuously by radiology nursing throughout the procedure under my direct supervision. CONTRAST:  15 cc-administered into the collecting system(s) FLUOROSCOPY: Radiation Exposure Index (as provided by the fluoroscopic device): 35 mGy Kerma COMPLICATIONS: None PROCEDURE: Informed written consent was obtained from the patient after a thorough discussion of the procedural risks, benefits and alternatives. All questions were addressed. Maximal Sterile Barrier Technique was utilized including caps, mask, sterile gowns, sterile gloves, sterile drape, hand hygiene and skin antiseptic. A timeout was performed prior to the initiation of the procedure. Patient positioned prone position on the fluoroscopy table. Ultrasound survey of the left flank was performed with images stored and sent to PACs. The patient was then prepped and draped in the usual sterile fashion. 1% lidocaine was used to anesthetize the skin and subcutaneous tissues for local anesthesia. A Chiba needle was then used to access a posterior inferior calyx with ultrasound guidance. With spontaneous urine returned through the needle, passage of an 018 micro wire into the collecting system was performed under fluoroscopy. A small incision was made with an 11 blade scalpel, and the needle was removed from the wire. An Accustick system was then  advanced over the wire into the collecting system under fluoroscopy. The metal stiffener and inner dilator were removed, and then contrast was injected confirming location. 035 wire was then passed into the collecting system and the Accustick was removed. Five [place] the catheter was advanced on the 03/05 wire into the proximal ureter and the J wire was removed. Attempt was made to navigate beyond the impacted stone with a Glidewire we. Glide wires were removed and then we were successful in navigating distally with an 035 roadrunner wire. Catheter was advanced on the roadrunner wire into the bladder. Wire was removed and contrast was injected confirming location. Glidewire was then used to  make an estimation of the ureteral stent length. We selected 26 cm 8.5 French antegrade stent. 035 J wire was then replaced into the bladder, catheter was removed. Ten French dilation was performed of the soft tissue tract. The selected 26 cm 8.5 French stent was then placed over the wire and deployed into the collecting system. We then placed a 10 [place] standard PCN drain into the collecting system. Contrast was injected. Catheter was sutured in position and final images were stored. The drain was attached to gravity drainage to allow for decompression. Patient tolerated the procedure well and remained hemodynamically stable throughout. No complications were encountered and no significant blood loss encountered IMPRESSION: Status post image guided antegrade right ureteral stent, 26 cm, as well as a right PCN to gravity drainage. Signed, Dulcy Fanny. Nadene Rubins, RPVI [facility] Radiology Electronically Signed   By: Corrie Mckusick D.O.   On: 04/16/2022 14:42   DG C-Arm 1-60 Min-No Report  Result Date: 04/01/2022 Fluoroscopy was utilized by the requesting physician.  No radiographic interpretation.    Labs:  CBC: Recent Labs    10/09/21 1330 10/13/21 1148  10/13/21 1449 10/14/21 0148 03/11/22 2225 04/16/22 0810  WBC 9.7  --   --  18.6* 15.4* 8.1  HGB 12.5*   < > 11.6* 10.9* 12.6* 11.0*  HCT 39.3   < > 34.0* 33.5* 38.6* 34.0*  PLT 304  --   --  255 343 202   < > = values in this interval not displayed.    COAGS: Recent Labs    10/09/21 1330 04/16/22 0810  INR 1.2 1.2    BMP: Recent Labs    10/09/21 1330 10/13/21 1148 10/14/21 0148 12/09/21 1558 03/11/22 2225 04/16/22 0810  NA 134*   < > 136 142 142 142  K 3.7   < > 4.0 3.8 4.4 3.7  CL 102   < > 108 104 103 110  CO2 23  --  '22 24 25 26  '$ GLUCOSE 155*   < > 154* 134* 172* 154*  BUN 28*   < > 21 24 27* 20  CALCIUM 9.9  --  9.4 10.0 10.5* 9.2  CREATININE 1.10   < > 1.18 1.04 1.47* 1.10  GFRNONAA >60  --  >60  --  51* >60   < > = values in this interval not displayed.    LIVER FUNCTION TESTS: Recent Labs    05/19/21 0715 08/25/21 0718 10/09/21 1330 03/11/22 2225  BILITOT 0.3  --  0.3 0.8  AST 51*  --  47* 47*  ALT 34 29 32 35  ALKPHOS 71  --  72 58  PROT 5.7*  --  6.6 6.5  ALBUMIN 3.7  --  3.9 3.8    TUMOR MARKERS: No results for input(s): "AFPTM", "CEA", "CA199", "CHROMGRNA" in the last 8760 hours.  Assessment and Plan:  [place] Porzio. is a 71 y.o. male with history of right-sided ureteral stone, post failed placement of a retrograde ureteral stent, subsequently post successful placement of an antegrade ureteral stent and nephrostomy catheter on 04/16/2022 Earleen Newport).  Antegrade nephrostogram demonstrates appropriate positioning and functionality of both the right-sided nephrostomy catheter as well as the right-sided ureteral stent.   The right-sided nephrostomy catheter was capped for the initiation of a capping trial.  The patient was given an extra gravity bag and instructed to reconnect nephrostomy catheter to the gravity bag if he were to experience recurrent obstructive  symptoms such as flank pain or excessive pericatheter leakage.  The patient will  maintain the nephrostomy catheter until the time of his ureteral stone removal scheduled for next week.   If the nephrostomy catheter was not removed at the time of ureteral stent removal, the patient may return to the interventional radiology drain clinic for antegrade nephrostogram and nephrostomy catheter removal at the discretion Dr. Alinda Money as indicated.   A copy of this report was sent to the requesting provider on this date.  Electronically Signed: Sandi Mariscal 04/28/2022, 2:22 PM   I spent a total of 5 Minutes in face to face in clinical consultation, greater than 50% of which was counseling/coordinating care for Nephrostomy management.

## 2022-04-30 ENCOUNTER — Telehealth: Payer: Medicare Other

## 2022-04-30 ENCOUNTER — Encounter (HOSPITAL_COMMUNITY)
Admission: RE | Admit: 2022-04-30 | Discharge: 2022-04-30 | Disposition: A | Discharge status: Home / Self Care | Payer: Medicare Other | Source: Ambulatory Visit | Attending: Urology | Admitting: Urology

## 2022-04-30 ENCOUNTER — Other Ambulatory Visit: Payer: Self-pay

## 2022-04-30 ENCOUNTER — Encounter (HOSPITAL_COMMUNITY): Payer: Self-pay

## 2022-04-30 VITALS — BP 146/64 | HR 71 | Temp 98.4°F | Resp 20 | Ht 66.0 in | Wt 160.2 lb

## 2022-04-30 DIAGNOSIS — E119 Type 2 diabetes mellitus without complications: Secondary | ICD-10-CM | POA: Insufficient documentation

## 2022-04-30 DIAGNOSIS — I2510 Atherosclerotic heart disease of native coronary artery without angina pectoris: Secondary | ICD-10-CM | POA: Insufficient documentation

## 2022-04-30 DIAGNOSIS — Z01812 Encounter for preprocedural laboratory examination: Secondary | ICD-10-CM | POA: Insufficient documentation

## 2022-04-30 LAB — BASIC METABOLIC PANEL
Anion gap: 5 (ref 5–15)
BUN: 24 mg/dL — ABNORMAL HIGH (ref 8–23)
CO2: 28 mmol/L (ref 22–32)
Calcium: 9.9 mg/dL (ref 8.9–10.3)
Chloride: 112 mmol/L — ABNORMAL HIGH (ref 98–111)
Creatinine, Ser: 1.13 mg/dL (ref 0.61–1.24)
GFR, Estimated: >60 mL/min (ref >60)
Glucose, Bld: 231 mg/dL — ABNORMAL HIGH (ref 70–99)
Potassium: 4.3 mmol/L (ref 3.5–5.1)
Sodium: 145 mmol/L (ref 135–145)

## 2022-04-30 LAB — GLUCOSE, CAPILLARY: Glucose-Capillary: 232 mg/dL — ABNORMAL HIGH (ref 70–99)

## 2022-04-30 NOTE — H&P (Signed)
Office Visit Report     04/22/2022   --------------------------------------------------------------------------------   Melvin Dunn  MRN: [id number]  DOB: [birth year], 71 year old Male  SSN: [id number]   PRIMARY CARE:  Minette Brine, MD  PRIMARY CARE FAX:  [phone]  REFERRING:  Luvenia Redden  PROVIDER:  Raynelle Bring, M.D.  LOCATION:  [facility] [phone]     --------------------------------------------------------------------------------   CC/HPI: 1. Locally advanced prostate cancer  2. BPH/LUTS  3. Urolithiasis/right ureteral calculus   Melvin Dunn returns today after undergoing attempted retrograde right ureteroscopic treatment of the mid ureteral stone. His stone was noted to be significantly impacted with an inability to gain access past 2 or up to the stone. The procedure was abandoned and he was seen by interventional radiology approximately 1 week ago and underwent antegrade nephrostomy tube placement. Fortunately, Dr. Earleen Newport was able to place an antegrade ureteral stent as well. He returns today and states that he is doing well. He is making fairly little urine per urethra as almost all of his urine is coming out his right nephrostomy tube with likely reflux from his bladder urine out his nephrostomy. He denies any fevers. His urine has been clear.     ALLERGIES: Lidocaine Promethazine    MEDICATIONS: Aspirin 325 mg tablet  Metoprolol Tartrate 25 mg tablet  Albuterol Sulfate  Atorvastatin Calcium 80 mg tablet  Clotrimazole 1 % cream  Fenofibrate  Garlique  Icosapent Ethyl  Lasix  Nexlizet  Nitroglycerin 0.4 mg tablet, sublingual  Xyzal     GU PSH: Cysto Bladder Stone <2.5cm - 2021 Cysto Dilate Stricture (M or F) - 2021 Cystoscopy Ureteroscopy - 04/01/2022 Locm 300-'399Mg'$ /Ml Iodine,1Ml - 2021 PLACE RT DEVICE/MARKER, PROS - 2021 Prostate Needle Biopsy - 2021, 2019, 2012 Transperineal Plmt Biodegradable Matrl 1/Mlt Njx - 2021        PSH Notes: CABG (CABG), Biopsy Of The Prostate Needle, Leg Repair   Heart Value replacement Jue 2023   NON-GU PSH: CABG (coronary artery bypass grafting) - 2016 Surgical Pathology, Gross And Microscopic Examination For Prostate Needle - 2021, 2019     GU PMH: Ureteral calculus - 03/18/2022 BPH w/LUTS - 10/21/2021, - 04/14/2021, - 2022, - 2021, - 2021 Bulbar urethral stricture - 10/21/2021, - 2022, - 2021, - 2021 Incomplete bladder emptying - 10/21/2021, - 04/14/2021, - 2021 Prostate Cancer - 10/21/2021, - 04/14/2021, - 2022, - 2021, - 2021, - 2021, - 2021, - 2021, - 2021, Although his prostate was free of any new nodularity or induration his PSA has increased to 10.6 which is higher than it has ever been in the past. We therefore discussed further evaluation with a prostate MRI. I did discuss fusion biopsy as an option depending on the results of his MRI scan and told him I would contact him with the results of the study and my recommendations., - 2021 (Stable), His prostate remains benign. His current PSA is 6.75 which is down from where was 6 months ago. His PSA has varied over time. At this point I have recommended continued active surveillance with repeat DRE and PSA again in 6 months., - 2020 (Stable), His DRE reveals no abnormality. His PSA is 7.67. I will continue active surveillance with repeat DRE and PSA again in 6 months., - 2020 (Stable), He has elected to proceed with continued active surveillance and will return in 6 months for repeat DRE and PSA., - 2019 (Stable), I have discussed with the patient the  possibility of blood per rectum, per urethra and in the ejaculate. He was counseled to contact me if he has any difficulties following his prostate biopsy whatsoever., - 2019 (Stable), His prostate is smooth and benign. His PSA is 5.53. We discussed the fact that it has been 7 years since his prostate biopsy and his PSA has remained stable but he has not undergone a repeat biopsy. We discussed  the options and he has elected to proceed with an MRI scan understanding that if potentially high-grade lesions are found this would allow for fusion biopsy to target the lesions more accurately., - 2019 (Stable), His prostate remains smooth. His PSA has gone up slightly. We discussed the fact that it has been higher in the past and I told him that if he should show a continued trend upward I would recommend a repeat biopsy. I will otherwise plan to see him back in 6 months for another DRE and PSA at that time., - 2018 Nocturia - 2022 Urinary Retention - 2021, - 2021 Bladder Stone - 2021, - 2021 BPH w/o LUTS (Stable), His prostate is enlarged to exam but is smooth and he has no significant voiding symptoms at this time. - 2018 ED due to arterial insufficiency, He wanted to consider sildenafil but will contact me if he would like to proceed with that. - 2017 Renal calculus, Renal calculus, bilateral - 2017 Renal cyst, Bilateral renal cysts - 2017 Urethral Stricture, Unspec, Urethral stricture - 2017      PMH Notes:   Melvin Dunn was followed by Dr. Karsten Ro until his retirement. I assumed his care in 2021.   1) Prostate cancer: He is s/p treatment for clinical stage T3a N0 M0, Gleason 4+3=7 adenocarcinoma with EBRT and long term ADT under the care of Dr. Tammi Klippel completed in January 2022.   Pretreatment PSA: 10.6  PSA nadir:   2) Nephrolithiasis: He was found to have bilateral nonobstructing renal calculi (2 in the right kidney and 4 in the left kidney) by CT scan in 3/17.   Nov 2023: Presented with a 6 mm right mid ureteral stone, attempted ureteroscopic treatment demonstrated an impacted stone and was unable to be treated  Dec 2023: Right nephrostomy tube placement.   3) Microscopic hematuria: He was found to have 3-10 rbc's/hpf on UA in 2/17. He underwent evaluation with cystoscopy and CT scan. His CT scan revealed bilateral renal calculi and bilateral renal cysts but no worrisome lesions.  Cystoscopically I found a mild bulbar urethral stricture that was dilated with the cystoscope.   4) Bulbar urethral stricture. He was found to have a mild bulbar urethral stricture, cystoscopy for microscopic hematuria in 3/17. This was dilated with the cystoscope and no further therapy was necessary.   Nov 2021: Balloon dilation of urethral stricture prior to radiation   5) Bladder calculi: He is s/p cystolithalopaxy in November 2021 prior to radiation therapy.     NON-GU PMH: Asthma, Asthma - 2014 Personal history of other diseases of the respiratory system, History of allergic rhinitis - 2014 Personal history of other endocrine, nutritional and metabolic disease, History of hyperlipidemia - 2014    FAMILY HISTORY: Dementia - Runs In Family Family Health Status Number - Runs In Family Father Deceased At [place] ___ - Runs In Family   SOCIAL HISTORY: Marital Status: Married Preferred Language: English; Ethnicity: Not Hispanic Or Latino; Race: White Current Smoking Status: Patient has never smoked.  Has never drank.  Drinks 3 caffeinated drinks per day.  Notes: Drug Use, Alcohol Use, Never A Smoker, Marital History - Currently Married   REVIEW OF SYSTEMS:    GU Review Male:   Patient denies frequent urination, hard to postpone urination, burning/ pain with urination, get up at night to urinate, leakage of urine, stream starts and stops, trouble starting your streams, and have to strain to urinate .  Gastrointestinal (Lower):   Patient denies diarrhea and constipation.  Gastrointestinal (Upper):   Patient denies nausea and vomiting.  Constitutional:   Patient denies fever, night sweats, weight loss, and fatigue.  Skin:   Patient denies skin rash/ lesion and itching.  Eyes:   Patient denies blurred vision and double vision.  Ears/ Nose/ Throat:   Patient denies sore throat and sinus problems.  Hematologic/Lymphatic:   Patient denies swollen glands and easy bruising.  Cardiovascular:    Patient denies leg swelling and chest pains.  Respiratory:   Patient denies cough and shortness of breath.  Endocrine:   Patient denies excessive thirst.  Musculoskeletal:   Patient denies back pain and joint pain.  Neurological:   Patient denies headaches and dizziness.  Psychologic:   Patient denies depression and anxiety.   VITAL SIGNS:      04/22/2022 03:21 PM  BP 145/82 mmHg  Pulse 64 /min  Temperature 96.9 F / 36.0 C   MULTI-SYSTEM PHYSICAL EXAMINATION:    Constitutional: Well-nourished. No physical deformities. Normally developed. Good grooming.  Respiratory: No labored breathing, no use of accessory muscles. Clear bilaterally.  Cardiovascular: Normal temperature, normal extremity pulses, no swelling, no varicosities. Regular rate and rhythm.  Gastrointestinal: No mass, no tenderness, no rigidity, non obese abdomen. Indwelling right nephrostomy tube with grossly clear urine.     Complexity of Data:  Records Review:   Previous Patient Records   10/09/21 04/07/21 07/30/20 06/21/19 12/27/18 07/04/18 10/27/17 04/21/17  PSA  Total PSA <0.02 ng/mL <0.015 ng/mL 0.018 ng/mL 10.60 ng/mL 6.75 ng/mL 7.67 ng/mL 5.53 ng/mL 6.95 ng/mL    10/09/21 04/07/21 07/30/20  Hormones  Testosterone, Total <10 ng/dL <10 ng/dL <10 ng/dL    PROCEDURES: None   ASSESSMENT:      ICD-10 Details  1 GU:   Ureteral calculus - N20.1    PLAN:            Medications Stop Meds: Levaquin 750 mg tablet Take the morning of your prostate biopsy.  Start: 09/03/2019  Discontinue: 04/22/2022  - Reason: The medication cycle was completed.            Orders Labs Urine Culture CATH  Lab Notes: Nephrostomy tube          Schedule Return Visit/Planned Activity: Other See Visit Notes             Note: Will call to schedule surgery. Okay to cancel appointments and ultrasound 12/15.          Document Letter(s):  Created for Patient: Clinical Summary         Notes:   1. Locally advanced prostate cancer:  He will keep his scheduled appointment in January for surveillance follow-up.   2. BPH/LUTS: Continue observation. His PVR will be periodically monitored.   3. Impacted right ureteral calculus: Fortunately, Dr. Earleen Newport was able to place an antegrade ureteral stent. We will allow a little bit more time for his ureter to hopefully dilate with plans to proceed with cystoscopy, right ureteroscopy with laser lithotripsy and right ureteral stent placement. We have reviewed the potential risks, complications, and expected recovery process  with that procedure. If unsuccessful, he will need an antegrade ureteroscopy. Assuming that his stone can be treated with ureteroscopy, we will leave an indwelling ureteral stent for a few weeks postoperatively to allow appropriate healing considering his impacted stone and increased risk for stricture formation. He does have a scheduled appointment in early January for his prostate cancer follow-up and we will plan to proceed with cystoscopy and right ureteral stent removal at that time with plans for a follow-up renal ultrasound a few weeks later to ensure proper right renal drainage. A urine culture has been obtained from his nephrostomy tube today for perioperative antibiotic therapy. He will continue aspirin 81 mg considering his history of a TAVR.   CC: Dr. Azalia Bilis  Dr. Damita Dunnings        Next Appointment:      Next Appointment: 05/21/2022 07:00 AM    Appointment Type: Laboratory Appointment    Location: [facility] [phone]    Provider: Lab LAB    Reason for Visit: 6 mo psa      * Signed by Raynelle Bring, M.D. on 04/22/22 at 4:03 PM (EST)*

## 2022-05-02 NOTE — Anesthesia Preprocedure Evaluation (Signed)
Anesthesia Evaluation  Patient identified by MRN, date of birth, ID band Patient awake    Reviewed: Allergy & Precautions, NPO status , Patient's Chart, lab work & pertinent test results, reviewed documented beta blocker date and time   Airway Mallampati: II  TM Distance: >3 FB Neck ROM: Full    Dental  (+) Poor Dentition, Dental Advisory Given   Pulmonary asthma    Pulmonary exam normal breath sounds clear to auscultation       Cardiovascular hypertension, Pt. on home beta blockers and Pt. on medications + CAD, + CABG (2015) and +CHF  Normal cardiovascular exam+ Valvular Problems/Murmurs (s/p TAVR 09/2021) AS  Rhythm:Regular Rate:Normal  TTE 10/2021  1. Left ventricular ejection fraction, by estimation, is 60 to 65%. The  left ventricle has normal function. The left ventricle has no regional  wall motion abnormalities. There is mild concentric left ventricular  hypertrophy. Left ventricular diastolic  parameters are indeterminate. Elevated left ventricular end-diastolic  pressure.   2. Right ventricular systolic function is normal. The right ventricular  size is normal. There is normal pulmonary artery systolic pressure. The  estimated right ventricular systolic pressure is 05.3 mmHg.   3. The mitral valve is normal in structure. Trivial mitral valve  regurgitation. No evidence of mitral stenosis.   4. The aortic valve has been repaired/replaced. Perivalvular Aortic valve  regurgitation is trivial. No aortic stenosis is present. Aortic valve mean  gradient measures 10.5 mmHg. Aortic valve Vmax measures 2.28 m/s. DVI  0.46.   5. The inferior vena cava is normal in size with greater than 50%  respiratory variability, suggesting right atrial pressure of 3 mmHg   Cath 2023 (prior to TAVR) 1. Patent LIMA to LAD and vein graft to second obtuse marginal. The sequential vein graft to the right coronary artery and R PLV branch has serial  high-grade stenoses. Given the fact the patient has no angina should be treated with medical therapy for now. 2. Severe native multivessel disease. 3. Normal cardiac output of 8.8 L/min and index of 4.7 L/min/m with mean right atrial pressure of 6 mmHg and mean wedge pressure of 15 mmHg. 4. Capacious iliofemoral vessels bilaterally with femoral bifurcation on the left side at the mid femoral head     Neuro/Psych Seizures - (seizure with lidocaine during prostate biopsy),   negative psych ROS   GI/Hepatic negative GI ROS, Neg liver ROS,,,  Endo/Other  diabetes, Well Controlled    Renal/GU negative Renal ROS  negative genitourinary   Musculoskeletal negative musculoskeletal ROS (+)    Abdominal   Peds  Hematology negative hematology ROS (+)   Anesthesia Other Findings   Reproductive/Obstetrics                             Anesthesia Physical Anesthesia Plan  ASA: 3  Anesthesia Plan: General   Post-op Pain Management: Tylenol PO (pre-op)*   Induction: Intravenous  PONV Risk Score and Plan: 2 and Ondansetron, Dexamethasone and Treatment may vary due to age or medical condition  Airway Management Planned: LMA  Additional Equipment:   Intra-op Plan:   Post-operative Plan: Extubation in OR  Informed Consent: I have reviewed the patients History and Physical, chart, labs and discussed the procedure including the risks, benefits and alternatives for the proposed anesthesia with the patient or authorized representative who has indicated his/her understanding and acceptance.     Dental advisory given  Plan Discussed with: CRNA  Anesthesia Plan Comments:        Anesthesia Quick Evaluation

## 2022-05-03 ENCOUNTER — Encounter (HOSPITAL_COMMUNITY): Payer: Self-pay | Admitting: Urology

## 2022-05-03 ENCOUNTER — Ambulatory Visit (HOSPITAL_BASED_OUTPATIENT_CLINIC_OR_DEPARTMENT_OTHER): Payer: Medicare Other | Admitting: Anesthesiology

## 2022-05-03 ENCOUNTER — Encounter (HOSPITAL_COMMUNITY)
Admission: RE | Disposition: A | Discharge status: Home / Self Care | Payer: Self-pay | Source: Ambulatory Visit | Attending: Urology

## 2022-05-03 ENCOUNTER — Ambulatory Visit (HOSPITAL_COMMUNITY): Payer: Medicare Other | Admitting: Physician Assistant

## 2022-05-03 ENCOUNTER — Ambulatory Visit (HOSPITAL_COMMUNITY): Payer: Medicare Other

## 2022-05-03 ENCOUNTER — Ambulatory Visit (HOSPITAL_COMMUNITY)
Admission: RE | Admit: 2022-05-03 | Discharge: 2022-05-03 | Disposition: A | Discharge status: Home / Self Care | Payer: Medicare Other | Source: Ambulatory Visit | Attending: Urology | Admitting: Urology

## 2022-05-03 DIAGNOSIS — J45909 Unspecified asthma, uncomplicated: Secondary | ICD-10-CM | POA: Diagnosis not present

## 2022-05-03 DIAGNOSIS — E119 Type 2 diabetes mellitus without complications: Secondary | ICD-10-CM | POA: Insufficient documentation

## 2022-05-03 DIAGNOSIS — N132 Hydronephrosis with renal and ureteral calculous obstruction: Secondary | ICD-10-CM | POA: Diagnosis not present

## 2022-05-03 DIAGNOSIS — I110 Hypertensive heart disease with heart failure: Secondary | ICD-10-CM

## 2022-05-03 DIAGNOSIS — N1330 Unspecified hydronephrosis: Secondary | ICD-10-CM | POA: Diagnosis not present

## 2022-05-03 DIAGNOSIS — Z436 Encounter for attention to other artificial openings of urinary tract: Secondary | ICD-10-CM | POA: Insufficient documentation

## 2022-05-03 DIAGNOSIS — I2510 Atherosclerotic heart disease of native coronary artery without angina pectoris: Secondary | ICD-10-CM | POA: Diagnosis not present

## 2022-05-03 DIAGNOSIS — N201 Calculus of ureter: Secondary | ICD-10-CM

## 2022-05-03 DIAGNOSIS — I509 Heart failure, unspecified: Secondary | ICD-10-CM | POA: Insufficient documentation

## 2022-05-03 HISTORY — PX: CYSTOSCOPY/URETEROSCOPY/HOLMIUM LASER/STENT PLACEMENT: SHX6546

## 2022-05-03 SURGERY — CYSTOSCOPY/URETEROSCOPY/HOLMIUM LASER/STENT PLACEMENT
Anesthesia: General | Laterality: Right

## 2022-05-03 MED ORDER — ONDANSETRON HCL 4 MG/2ML IJ SOLN
INTRAMUSCULAR | Status: DC | PRN
  Administered 2022-05-03: 4 mg via INTRAVENOUS

## 2022-05-03 MED ORDER — DEXAMETHASONE SODIUM PHOSPHATE 10 MG/ML IJ SOLN
INTRAMUSCULAR | Status: DC | PRN
  Administered 2022-05-03: 5 mg via INTRAVENOUS

## 2022-05-03 MED ORDER — ONDANSETRON HCL 4 MG/2ML IJ SOLN
INTRAMUSCULAR | Status: AC
  Filled 2022-05-03: qty 2

## 2022-05-03 MED ORDER — IOHEXOL 300 MG/ML  SOLN
INTRAMUSCULAR | Status: DC | PRN
  Administered 2022-05-03: 4 mL

## 2022-05-03 MED ORDER — TRAMADOL HCL 50 MG PO TABS: 50.0000 mg | ORAL_TABLET | Freq: Four times a day (QID) | ORAL | 0 refills | Status: DC | PRN

## 2022-05-03 MED ORDER — LACTATED RINGERS IV SOLN: INTRAVENOUS | Status: DC

## 2022-05-03 MED ORDER — ACETAMINOPHEN 500 MG PO TABS
1000.0000 mg | ORAL_TABLET | Freq: Once | ORAL | Status: AC
  Administered 2022-05-03: 1000 mg via ORAL
  Filled 2022-05-03: qty 2

## 2022-05-03 MED ORDER — MIDAZOLAM HCL 5 MG/5ML IJ SOLN
INTRAMUSCULAR | Status: DC | PRN
  Administered 2022-05-03: 2 mg via INTRAVENOUS

## 2022-05-03 MED ORDER — ORAL CARE MOUTH RINSE: 15.0000 mL | Freq: Once | OROMUCOSAL | Status: AC

## 2022-05-03 MED ORDER — LIDOCAINE HCL (PF) 2 % IJ SOLN
INTRAMUSCULAR | Status: AC
  Filled 2022-05-03: qty 5

## 2022-05-03 MED ORDER — PROPOFOL 10 MG/ML IV BOLUS
INTRAVENOUS | Status: AC
  Filled 2022-05-03: qty 20

## 2022-05-03 MED ORDER — PHENYLEPHRINE 80 MCG/ML (10ML) SYRINGE FOR IV PUSH (FOR BLOOD PRESSURE SUPPORT)
PREFILLED_SYRINGE | INTRAVENOUS | Status: DC | PRN
  Administered 2022-05-03 (×2): 160 ug via INTRAVENOUS
  Administered 2022-05-03: 240 ug via INTRAVENOUS
  Administered 2022-05-03: 160 ug via INTRAVENOUS

## 2022-05-03 MED ORDER — PROPOFOL 10 MG/ML IV BOLUS
INTRAVENOUS | Status: DC | PRN
  Administered 2022-05-03: 100 mg via INTRAVENOUS

## 2022-05-03 MED ORDER — FENTANYL CITRATE (PF) 100 MCG/2ML IJ SOLN
INTRAMUSCULAR | Status: AC
  Filled 2022-05-03: qty 2

## 2022-05-03 MED ORDER — CHLORHEXIDINE GLUCONATE 0.12 % MT SOLN
15.0000 mL | Freq: Once | OROMUCOSAL | Status: AC
  Administered 2022-05-03: 15 mL via OROMUCOSAL

## 2022-05-03 MED ORDER — 0.9 % SODIUM CHLORIDE (POUR BTL) OPTIME
TOPICAL | Status: DC | PRN
  Administered 2022-05-03: 1000 mL

## 2022-05-03 MED ORDER — FENTANYL CITRATE (PF) 100 MCG/2ML IJ SOLN
INTRAMUSCULAR | Status: DC | PRN
  Administered 2022-05-03 (×4): 25 ug via INTRAVENOUS

## 2022-05-03 MED ORDER — FENTANYL CITRATE PF 50 MCG/ML IJ SOSY: 25.0000 ug | PREFILLED_SYRINGE | INTRAMUSCULAR | Status: DC | PRN

## 2022-05-03 MED ORDER — DEXAMETHASONE SODIUM PHOSPHATE 10 MG/ML IJ SOLN
INTRAMUSCULAR | Status: AC
  Filled 2022-05-03: qty 1

## 2022-05-03 MED ORDER — MIDAZOLAM HCL 2 MG/2ML IJ SOLN
INTRAMUSCULAR | Status: AC
  Filled 2022-05-03: qty 2

## 2022-05-03 MED ORDER — PHENYLEPHRINE 80 MCG/ML (10ML) SYRINGE FOR IV PUSH (FOR BLOOD PRESSURE SUPPORT)
PREFILLED_SYRINGE | INTRAVENOUS | Status: AC
  Filled 2022-05-03: qty 10

## 2022-05-03 MED ORDER — SODIUM CHLORIDE 0.9 % IR SOLN
Status: DC | PRN
  Administered 2022-05-03: 3000 mL via INTRAVESICAL

## 2022-05-03 MED ORDER — CEFAZOLIN SODIUM-DEXTROSE 2-4 GM/100ML-% IV SOLN
2.0000 g | INTRAVENOUS | Status: AC
  Administered 2022-05-03: 2 g via INTRAVENOUS
  Filled 2022-05-03: qty 100

## 2022-05-03 SURGICAL SUPPLY — 24 items
BAG COUNTER SPONGE SURGICOUNT (BAG) IMPLANT
BAG SPNG CNTER NS LX DISP (BAG)
BAG URO CATCHER STRL LF (MISCELLANEOUS) ×2 IMPLANT
BASKET ZERO TIP NITINOL 2.4FR (BASKET) IMPLANT
BSKT STON RTRVL ZERO TP 2.4FR (BASKET) ×1
CATH URETL OPEN END 6FR 70 (CATHETERS) IMPLANT
CLOTH BEACON ORANGE TIMEOUT ST (SAFETY) ×2 IMPLANT
GLOVE SURG LX STRL 7.5 STRW (GLOVE) ×2 IMPLANT
GOWN STRL REUS W/ TWL XL LVL3 (GOWN DISPOSABLE) ×2 IMPLANT
GOWN STRL REUS W/TWL XL LVL3 (GOWN DISPOSABLE) ×1
GUIDEWIRE STR DUAL SENSOR (WIRE) ×2 IMPLANT
GUIDEWIRE ZIPWRE .038 STRAIGHT (WIRE) IMPLANT
IV NS 1000ML (IV SOLUTION)
IV NS 1000ML BAXH (IV SOLUTION) ×2 IMPLANT
KIT TURNOVER KIT A (KITS) IMPLANT
LASER FIB FLEXIVA PULSE ID 365 (Laser) IMPLANT
MANIFOLD NEPTUNE II (INSTRUMENTS) ×2 IMPLANT
PACK CYSTO (CUSTOM PROCEDURE TRAY) ×2 IMPLANT
SHEATH NAVIGATOR HD 12/14X36 (SHEATH) IMPLANT
STENT CONTOUR NO GW 8FR 26CM (STENTS) IMPLANT
TRACTIP FLEXIVA PULS ID 200XHI (Laser) IMPLANT
TRACTIP FLEXIVA PULSE ID 200 (Laser) ×1
TUBING CONNECTING 10 (TUBING) ×2 IMPLANT
TUBING UROLOGY SET (TUBING) ×2 IMPLANT

## 2022-05-03 NOTE — Discharge Instructions (Signed)

## 2022-05-03 NOTE — Transfer of Care (Signed)
Immediate Anesthesia Transfer of Care Note  Patient: Jaysion Ramseyer.  Procedure(s) Performed: CYSTOSCOPY/RIGHT URETEROSCOPY/HOLMIUM LASER/RIGHT STENT PLACEMENT (Right)  Patient Location: PACU  Anesthesia Type:General  Level of Consciousness: sedated  Airway & Oxygen Therapy: Patient Spontanous Breathing and Patient connected to face mask oxygen  Post-op Assessment: Report given to RN and Post -op Vital signs reviewed and stable  Post vital signs: Reviewed and stable  Last Vitals:  Vitals Value Taken Time  BP    Temp    Pulse 69 05/03/22 1227  Resp    SpO2 100 % 05/03/22 1227  Vitals shown include unvalidated device data.  Last Pain:  Vitals:   05/03/22 0905  TempSrc: Oral  PainSc:          Complications: No notable events documented.

## 2022-05-03 NOTE — Anesthesia Postprocedure Evaluation (Signed)
Anesthesia Post Note  Patient: Melvin Dunn.  Procedure(s) Performed: CYSTOSCOPY/RIGHT URETEROSCOPY/HOLMIUM LASER/RIGHT STENT PLACEMENT (Right)     Patient location during evaluation: PACU Anesthesia Type: General Level of consciousness: awake and alert Pain management: pain level controlled Vital Signs Assessment: post-procedure vital signs reviewed and stable Respiratory status: spontaneous breathing, nonlabored ventilation, respiratory function stable and patient connected to nasal cannula oxygen Cardiovascular status: blood pressure returned to baseline and stable Postop Assessment: no apparent nausea or vomiting Anesthetic complications: no  No notable events documented.  Last Vitals:  Vitals:   05/03/22 1300 05/03/22 1312  BP: 135/62 135/78  Pulse: 70 66  Resp: 18 16  Temp:  36.9 C  SpO2: 93% 94%    Last Pain:  Vitals:   05/03/22 1312  TempSrc:   PainSc: 0-No pain                 Melvin Dunn

## 2022-05-03 NOTE — Anesthesia Procedure Notes (Signed)
Procedure Name: LMA Insertion Date/Time: 05/03/2022 11:32 AM  Performed by: Talbot Grumbling, CRNAPre-anesthesia Checklist: Patient identified, Suction available, Patient being monitored and Emergency Drugs available Patient Re-evaluated:Patient Re-evaluated prior to induction Oxygen Delivery Method: Circle system utilized Preoxygenation: Pre-oxygenation with 100% oxygen Induction Type: IV induction Ventilation: Mask ventilation without difficulty LMA: LMA inserted LMA Size: 4.0 Number of attempts: 1 Placement Confirmation: positive ETCO2 and breath sounds checked- equal and bilateral Tube secured with: Tape Dental Injury: Teeth and Oropharynx as per pre-operative assessment

## 2022-05-03 NOTE — Op Note (Signed)
Preoperative diagnosis: Right ureteral calculus  Postoperative diagnosis: Right ureteral calculus  Procedures: 1.  Cystoscopy 2.  Right retrograde pyelography with interpretation 3.  Right ureteroscopy with laser lithotripsy and stone removal 4.  Right ureteral stent placement (8 x 26-no string) 5.  Removal of right nephrostomy tube under fluoroscopy  Surgeon: Pryor Curia MD  Anesthesia: General  Complications none  EBL: Minimal  Specimens: Right ureteral calculus  Disposition of specimen: [facility]  Intraoperative findings: Right retrograde pyelography was performed with Omnipaque contrast and a 6 French ureteral catheter.  This demonstrated narrowing of the mid ureter measured approximately 2.5 cm.  There was dilated ureter above this level without filling defects noted.  Mild to moderate hydronephrosis was appreciated.  Indication: Melvin Dunn is a 71 year old gentleman who recently was diagnosed with a 6 mm right mid ureteral calculus.  Attempts at retrograde treatment initially were unsuccessful consistent with a completely impacted right ureteral stone.  Right percutaneous nephrostomy tube placement was then performed along with right antegrade stent placement which was successful.  He was given approximately 2 weeks for passive ureteral dilation and now presents today for further attempts at retrograde treatment of his stone ureteroscopically.  The potential risks, complications, and expected recovery process associated with the above procedures was discussed in detail.  Informed consent was obtained.  Description of procedure: The patient was taken the operating room and general anesthetic was administered.  He was given preoperative antibiotics, placed in the dorsal lithotomy position, and prepped and draped in usual sterile fashion.  Next, a preoperative timeout was performed.  Cystourethroscopy was performed revealed mild benign prostatic hyperplasia.   Inspection of the bladder revealed an indwelling right ureteral stent.  This was removed to the urethral meatus with a flexible grasper.  During the course of the removing his stent, it became completely dislodged into the bladder.  It was removed.  The wire was left in place but unfortunately was not past the stone.  A 6 French ureteral catheter was then used to perform a right retrograde pyelogram with Omnipaque contrast.  This revealed findings as noted above.  There was ample contrast that was able to bypass the stone/strictured area.  A 0.38 sensor guidewire was then advanced up into the right renal collecting system easily under fluoroscopic guidance.  An attempt was made to perform ureteroscopy with a 6 French semirigid ureteroscope.  Attempts to advance this up to the level of the stone were unsuccessful where significant resistance was met.  The ureteroscope was therefore withdrawn and a digital flexible ureteroscope was advanced next to the wire and was able to advance up to the stone.  This did appear to be significantly impacted within the right ureteral wall.  Using a 200 m holmium laser fiber, the stone was dusted in settings of 0.2 J and 70 Hz.  Once the stone was completely dusted, the scope was withdrawn and placed back into the ureter to confirm that there were no additional stone fragments in the ureter.  The ureter was examined as the scope was advanced all the way up in the right renal collecting system with a nephrostomy tube was visualized.  The ureteroscope was then withdrawn and cystoscopy was again performed.  Any stone fragments were removed from the bladder at this point.  An 8 x 26 double-J ureteral stent was then advanced over the wire using Seldinger technique.  It was positioned appropriately under fluoroscopic and cystoscopic guidance.  The wire was removed with  a good curl noted in the renal pelvis as well as within the bladder.  The bladder was then emptied.  Under fluoroscopy,  the right percutaneous nephrostomy tube was then carefully withdrawn ensuring that the stent remained appropriately in place.  The patient tolerated the procedure well without complications.  He was able to be awakened and transferred to recovery in satisfactory condition.

## 2022-05-03 NOTE — Interval H&P Note (Signed)
History and Physical Interval Note:  05/03/2022 11:14 AM  Melvin Dunn.  has presented today for surgery, with the diagnosis of RIGHT URETERAL CALCULUS.  The various methods of treatment have been discussed with the patient and family. After consideration of risks, benefits and other options for treatment, the patient has consented to  Procedure(s) with comments: CYSTOSCOPY/RIGHT URETEROSCOPY/HOLMIUM LASER/RIGHT STENT PLACEMENT (Right) - 90 MINUTES NEEDED FOR CASE as a surgical intervention.  The patient's history has been reviewed, patient examined, no change in status, stable for surgery.  I have reviewed the patient's chart and labs.  Questions were answered to the patient's satisfaction.     [employer]

## 2022-05-04 ENCOUNTER — Encounter (HOSPITAL_COMMUNITY): Payer: Self-pay | Admitting: Urology

## 2022-05-05 ENCOUNTER — Ambulatory Visit: Payer: Medicare Other | Admitting: Cardiology

## 2022-05-06 DIAGNOSIS — J309 Allergic rhinitis, unspecified: Secondary | ICD-10-CM | POA: Diagnosis not present

## 2022-05-06 DIAGNOSIS — J45901 Unspecified asthma with (acute) exacerbation: Secondary | ICD-10-CM | POA: Diagnosis not present

## 2022-05-06 DIAGNOSIS — K219 Gastro-esophageal reflux disease without esophagitis: Secondary | ICD-10-CM | POA: Diagnosis not present

## 2022-05-18 ENCOUNTER — Ambulatory Visit: Payer: Medicare Other | Admitting: Cardiology

## 2022-05-26 IMAGING — DX DG CHEST 2V
2 series · 2 of 2 positions shown · non-contrast
Comparison: 12/13/2016, 08/04/2021.

CLINICAL DATA: Preoperative exam.

EXAM:
CHEST - 2 VIEW

[w chest pa]
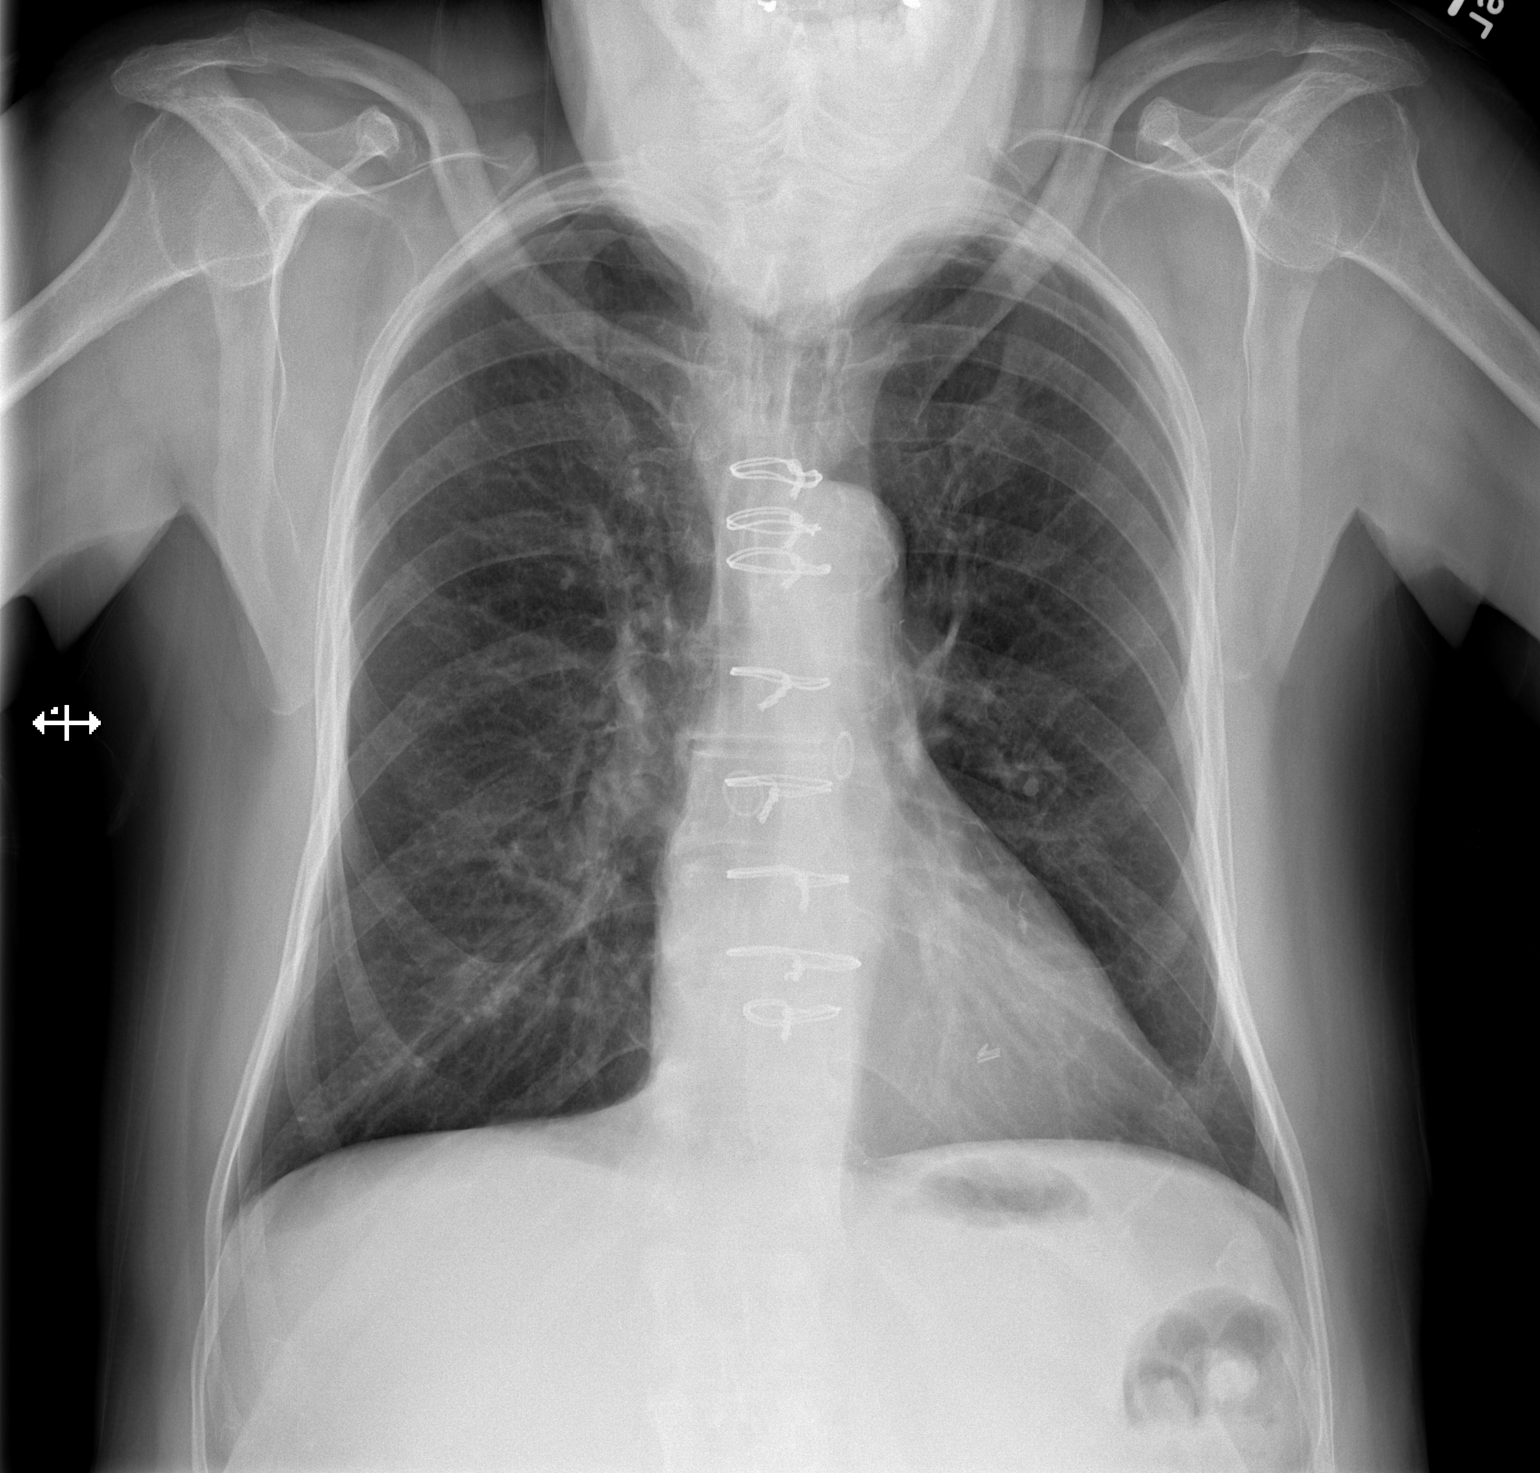

[w chest lat]
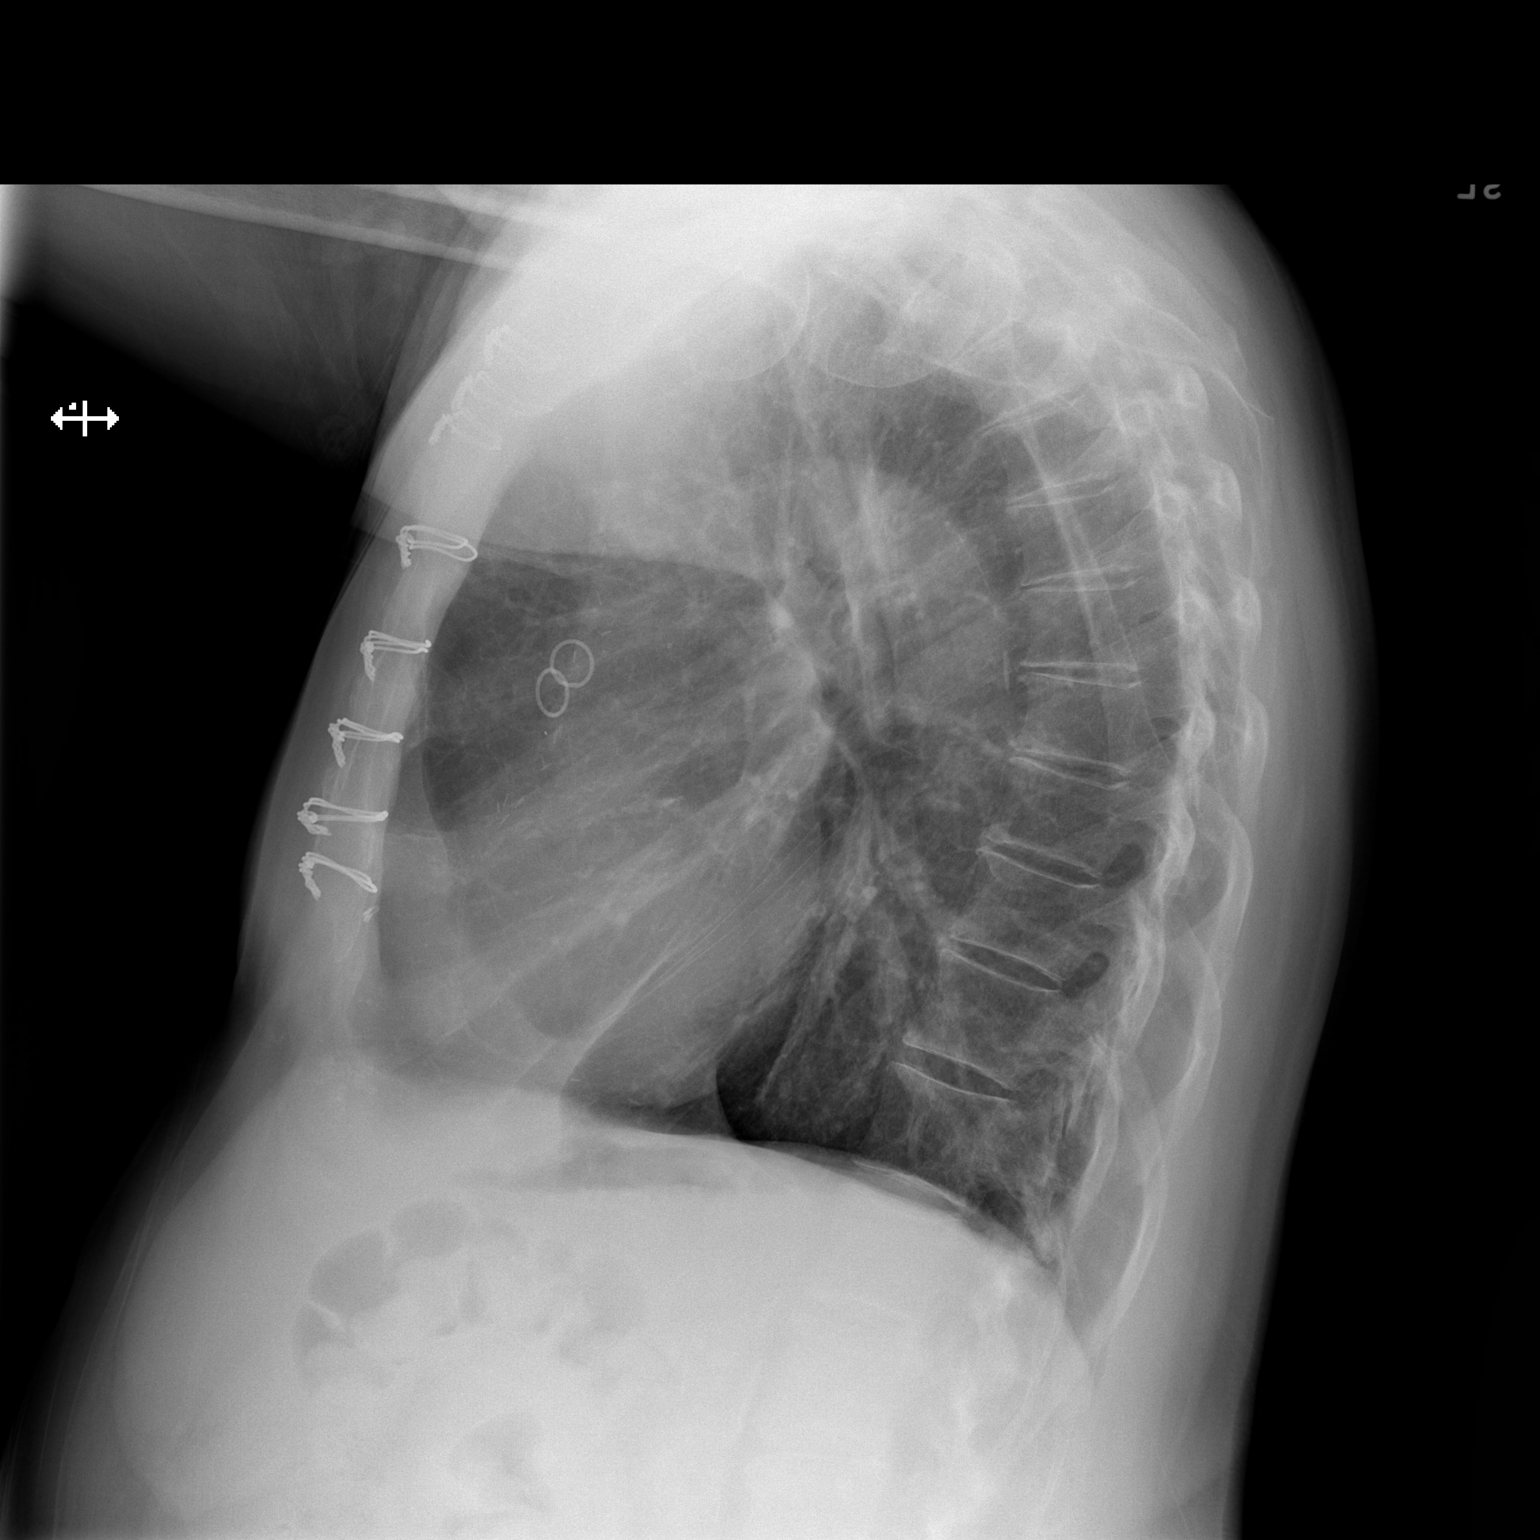

[2 of 2 positions shown; findings below may reference images not displayed]

FINDINGS: The heart size and mediastinal contours are within normal limits.
Hyperinflation of the lungs, flattening of the diaphragms, and
increased AP diameter of the chest is noted suggesting chronic
obstructive pulmonary disease. There is atherosclerotic
calcification of the aorta. No consolidation, effusion, or
pneumothorax. Mild degenerative changes in the thoracic spine.
Sternotomy wires are noted over the midline.
IMPRESSION: 1. No acute cardiopulmonary process.
2. Chronic obstructive pulmonary disease.

## 2022-05-28 ENCOUNTER — Ambulatory Visit: Payer: Medicare Other | Attending: Cardiology | Admitting: Cardiology

## 2022-05-28 ENCOUNTER — Encounter: Payer: Self-pay | Admitting: Cardiology

## 2022-05-28 VITALS — BP 124/56 | HR 64 | Ht 66.0 in | Wt 157.8 lb

## 2022-05-28 DIAGNOSIS — N201 Calculus of ureter: Secondary | ICD-10-CM | POA: Diagnosis not present

## 2022-05-28 DIAGNOSIS — I5032 Chronic diastolic (congestive) heart failure: Secondary | ICD-10-CM

## 2022-05-28 DIAGNOSIS — E785 Hyperlipidemia, unspecified: Secondary | ICD-10-CM

## 2022-05-28 DIAGNOSIS — E1169 Type 2 diabetes mellitus with other specified complication: Secondary | ICD-10-CM | POA: Diagnosis not present

## 2022-05-28 DIAGNOSIS — I2510 Atherosclerotic heart disease of native coronary artery without angina pectoris: Secondary | ICD-10-CM

## 2022-05-28 DIAGNOSIS — I350 Nonrheumatic aortic (valve) stenosis: Secondary | ICD-10-CM | POA: Diagnosis not present

## 2022-05-28 NOTE — Patient Instructions (Signed)
Medication Instructions:  Your physician recommends that you continue on your current medications as directed. Please refer to the Current Medication list given to you today.  *If you need a refill on your cardiac medications before your next appointment, please call your pharmacy*   Lab Work: None.  If you have labs (blood work) drawn today and your tests are completely normal, you will receive your results only by: MyChart Message (if you have MyChart) OR A paper copy in the mail If you have any lab test that is abnormal or we need to change your treatment, we will call you to review the results.   Testing/Procedures: None.   Follow-Up:   Your next appointment:   1 year(s)  Provider:   Traci [name], MD     

## 2022-05-28 NOTE — Progress Notes (Signed)
Cardiology Office Note:    Date:  05/28/2022   ID:  Melvin Guess., DOB [birth year], MRN [id number]  PCP:  Shirline Frees, MD  Cardiologist:  Fransico Him, MD    Referring MD: Shirline Frees, MD   Chief Complaint  Patient presents with   Coronary Artery Disease   Hyperlipidemia   Aortic Stenosis   Congestive Heart Failure    History of Present Illness:    Melvin Chagnon. is a 72 y.o. male with a hx of dyslipidemia, diabetes and ASCAD with cath revealing critical left main stenosis and multivessel ASCAD with normal LVF.  He underwent CABG with LIMA to LAD, SVG to OM, SVG to distal RCA/PL.    He also has a hx of moderate to severe AS with last echo 11/2018 showing normal LVF with EF 55-60% with moderate to severe AS with peak AV velocity of 339cm/s, mean AVG 39mHg, AVA 0.77cm2 and dimensionless index 0.29.  He was referred to Dr. TAli Loweand underwent cardiac catheterization revealing 70% proximal graft 1 lesion before RPDA followed by 80% proximal graft to lesion before RPDA and 60% distal graft lesion before RPDA, 80% mid LM, occluded mid LAD, 70% proximal LAD, occluded distal RCA and occluded ostial left circumflex with a patent LIMA to the LAD and SVG to the OM 2.  He underwent TAVR on 10/13/2021 with 23 mm ERenaldo Fiddlerare via left carotid artery approach by Dr. TAli Loweand Dr. BCyndia Bent  He is here today for followup and is doing well.  He occasionally has some SOB associated with asthma flares. He denies any chest pain or pressure, PND, orthopnea, LE edema, dizziness, palpitations or syncope. He is compliant with his meds and is tolerating meds with no SE.    Past Medical History:  Diagnosis Date   Asthma    no current inhaler use   CAD (coronary artery disease), native coronary artery    LIMA to LAD, SVG to OM1, SVG to RCA-RPL, EVH via right thigh and leg   CHF (congestive heart failure) (HCC)    Colon polyp    Environmental allergies    History of kidney stones     Hyperlipidemia    Hypertension    Pre-diabetes    [place]    adenocarcimona, of the prostate, 5% of single core 01/2011-watching and waiting for now   S/P TAVR (transcatheter aortic valve replacement) 10/13/2021   s/p TAVR with a 290mEdwards S3UR via the left carotid approach by Dr. ThAli Lowe Dr[facility]   due to lidocaine allergy with prostate biopsy this year   Severe aortic stenosis     Past Surgical History:  Procedure Laterality Date   CORONARY ARTERY BYPASS GRAFT N/A 02/13/2014   Procedure: CORONARY ARTERY BYPASS GRAFTING (CABG) TIMES FOUR USING LEFT INTERNAL MAMMARY ARTERY AND RIGHT SAPHENOUS LEG VEIN HARVESTED ENDOSCOPICALLY;  Surgeon: ClRexene AlbertsMD;  [facility] Service: Open [facility];  Laterality: N/A;   CYSTOSCOPY WITH LITHOLAPAXY N/A 03/17/2020   Procedure: CYSTOSCOPY WITH LITHOLAPAXY;  Surgeon: BoRaynelle BringMD;  [facility] Service[facility];  Laterality: N/A;   CYSTOSCOPY/URETEROSCOPY/HOLMIUM LASER/STENT PLACEMENT Right 04/01/2022   Procedure: CYSTOSCOPY/RIGHT URETEROSCOP/RIGHT RETROGRADE PYELOGRAM;  Surgeon: BoRaynelle BringMD;  [facility];  Service: Urology;  Laterality: Right;  60 MINUTES NEEDED FOR CASE   CYSTOSCOPY/URETEROSCOPY/HOLMIUM LASER/STENT PLACEMENT Right 05/03/2022   Procedure: CYSTOSCOPY/RIGHT URETEROSCOPY/HOLMIUM LASER/RIGHT STENT PLACEMENT;  Surgeon: BoRaynelle BringMD;  [facility];  Service: Urology;  Laterality: Right;  90 MINUTES NEEDED FOR CASE   cytoscopy  15 yrs ago   EXTRACORPOREAL SHOCK WAVE LITHOTRIPSY  15 yrs ago   EYE SURGERY Bilateral 2015   cataracts   GOLD SEED IMPLANT N/A 03/17/2020   Procedure: GOLD SEED IMPLANT;  Surgeon: Raynelle Bring, MD;  Location: [facility];  Service: Urology;  Laterality: N/A;   INTRAOPERATIVE TRANSESOPHAGEAL ECHOCARDIOGRAM N/A 02/13/2014   Procedure: INTRAOPERATIVE TRANSESOPHAGEAL ECHOCARDIOGRAM;  Surgeon:  Rexene Alberts, MD;  Location: [place];  Service: Open Heart Surgery;  Laterality: N/A;   INTRAOPERATIVE TRANSESOPHAGEAL ECHOCARDIOGRAM N/A 10/13/2021   Procedure: INTRAOPERATIVE TRANSESOPHAGEAL ECHOCARDIOGRAM;  Surgeon: Early Osmond, MD;  [facility];  Service: Open Heart Surgery;  Laterality: N/A;   IR URETERAL STENT RIGHT NEW ACCESS W/SEP NEPHROSTOMY CATH  04/16/2022   LEFT HEART CATHETERIZATION WITH CORONARY ANGIOGRAM N/A 02/12/2014   Procedure: LEFT HEART CATHETERIZATION WITH CORONARY ANGIOGRAM;  Surgeon: Blane Ohara, MD;  Location: [facility] CATH LAB;  Service: Cardiovascular;  Laterality: N/A;   left leg surgery for fracture  2004   plates inserted   PROSTATE BIOPSY  06/2019   RIGHT/LEFT HEART CATH AND CORONARY/GRAFT ANGIOGRAPHY N/A 07/31/2021   Procedure: RIGHT/LEFT HEART CATH AND CORONARY/GRAFT ANGIOGRAPHY;  Surgeon: Early Osmond, MD;  Location: [place] CV LAB;  Service: Cardiovascular;  Laterality: N/A;   SPACE OAR INSTILLATION N/A 03/17/2020   Procedure: SPACE OAR INSTILLATION;  Surgeon: Raynelle Bring, MD;  Location[facility];  Service: Urology;  Laterality: N/A;   TRANSCATHETER AORTIC VALVE REPLACEMENT, CAROTID Left 10/13/2021   Procedure: Transcatheter Aortic Valve Replacement-Carotid;  Surgeon: Early Osmond, MD;  Location: [place];  Service: Open Heart Surgery;  Laterality: Left;   ULTRASOUND GUIDANCE FOR VASCULAR ACCESS Right 10/13/2021   Procedure: ULTRASOUND GUIDANCE FOR VASCULAR ACCESS;  Surgeon: Early Osmond, MD;  Location: [place];  Service: Open Heart Surgery;  Laterality: Right;    Current Medications: Current Meds  Medication Sig   albuterol (VENTOLIN HFA) 108 (90 Base) MCG/ACT inhaler Inhale 1-2 puffs into the lungs every 6 (six) hours as needed for wheezing or shortness of breath.   amoxicillin (AMOXIL) 500 MG tablet Take 4 tablets (2,000 mg total) by mouth as directed. 1 hour prior to dental work including cleanings   aspirin 81 MG  chewable tablet Chew 81 mg by mouth daily.   atorvastatin (LIPITOR) 80 MG tablet TAKE 1 TABLET BY MOUTH EVERY DAY   Bempedoic Acid-Ezetimibe (NEXLIZET) 180-10 MG TABS Take 1 tablet by mouth daily.   fenofibrate (TRICOR) 145 MG tablet TAKE 1 TABLET BY MOUTH EVERY DAY   FLOVENT HFA 110 MCG/ACT inhaler Inhale 2 puffs into the lungs in the morning and at bedtime.   furosemide (LASIX) 20 MG tablet Take 1 tablet (20 mg total) by mouth daily.   icosapent Ethyl (VASCEPA) 1 g capsule Take 2 capsules (2 g total) by mouth 2 (two) times daily.   imiquimod (ALDARA) 5 % cream Apply 1 Application topically 3 (three) times a week.   levocetirizine (XYZAL) 5 MG tablet Take 5 mg by mouth every evening.   metoprolol tartrate (LOPRESSOR) 50 MG tablet Take 50 mg by mouth 2 (two) times daily.   nitroGLYCERIN (NITROSTAT) 0.4 MG SL tablet Place 1 tablet (0.4 mg total) under the tongue every 5 (five) minutes as needed for chest pain.   omeprazole (PRILOSEC) 40 MG capsule Take 40 mg by mouth every morning.  Allergies:   Lidocaine and Promethazine hcl   Social History   Socioeconomic History   Marital status: Single    Spouse name: Not on file   Number of children: Not on file   Years of education: Not on file   Highest education level: Not on file  Occupational History    Comment: full time  Tobacco Use   Smoking status: Never   Smokeless tobacco: Never  Vaping Use   Vaping Use: Never used  Substance and Sexual Activity   Alcohol use: No   Drug use: No   Sexual activity: Yes  Other Topics Concern   Not on file  Social History Narrative   Not on file   Social Determinants of Health   Financial Resource Strain: Not on file  Food Insecurity: Not on file  Transportation Needs: Not on file  Physical Activity: Not on file  Stress: Not on file  Social Connections: Not on file     Family History: The patient's family history includes Alzheimer's disease in his mother; Cancer in his father; Cancer  - Lung in his father; Heart attack in his maternal grandfather; Skin cancer in his mother; Stroke in his mother. There is no history of Breast cancer, Colon cancer, Prostate cancer, or Pancreatic cancer.  ROS:   Please see the history of present illness.    ROS  All other systems reviewed and negative.   EKGs/Labs/Other Studies Reviewed:    The following studies were reviewed today: 2D echo 11/2018, outside labs from [facility]  EKG:  EKG is not ordered today.    Recent Labs: 10/14/2021: Magnesium 1.7 03/11/2022: ALT 35 04/16/2022: Hemoglobin 11.0; Platelets 202 04/30/2022: BUN 24; Creatinine, Ser 1.13; Potassium 4.3; Sodium 145   Recent Lipid Panel    Component Value Date/Time   CHOL 96 (L) 08/25/2021 0718   TRIG 243 (H) 08/25/2021 0718   HDL 6 (L) 08/25/2021 0718   CHOLHDL 16.0 (H) 08/25/2021 0718   CHOLHDL 3.6 05/03/2016 0909   VLDL 18 05/03/2016 0909   LDLCALC 51 08/25/2021 0718    Physical Exam:    VS:  BP (!) 124/56   Pulse 64   Ht '5\' 6"'$  (1.676 m)   Wt 157 lb 12.8 oz (71.6 kg)   SpO2 94%   BMI 25.47 kg/m     Wt Readings from Last 3 Encounters:  05/28/22 157 lb 12.8 oz (71.6 kg)  05/03/22 160 lb 3.2 oz (72.7 kg)  04/30/22 160 lb 3.2 oz (72.7 kg)     GEN: Well nourished, well developed in no acute distress HEENT: Normal NECK: No JVD; No carotid bruits LYMPHATICS: No lymphadenopathy CARDIAC:RRR, no murmurs, rubs, gallops RESPIRATORY:  Clear to auscultation without rales, wheezing or rhonchi  ABDOMEN: Soft, non-tender, non-distended MUSCULOSKELETAL:  No edema; No deformity  SKIN: Warm and dry NEUROLOGIC:  Alert and oriented x 3 PSYCHIATRIC:  Normal affect  ASSESSMENT:    1. Coronary artery disease involving native coronary artery of native heart without angina pectoris   2. Hyperlipidemia associated with type 2 diabetes mellitus ([place])   3. Nonrheumatic aortic valve stenosis   4. Chronic diastolic heart failure (HCC)    PLAN:    In order of problems listed  above:  1.  ASCAD -cath 2015  critical left main stenosis and multivessel ASCAD with normal LVF.  -s/p CABG with LIMA to LAD, SVG to OM, SVG to distal RCA/PL. -Cath 09/2021 showed patent LIMA to LAD and vein graft to second obtuse marginal.  The sequential vein graft to the right coronary artery and R PLV branch has serial high-grade stenoses. Given the fact the patient has no angina medical therapy was recommended -He denies any anginal symptoms since I saw him last -Continue prescription drug management with aspirin 81 mg daily, Lopressor 50 mg twice daily and high-dose statin therapy with as needed refills  2.  HLD -LDL goal < 50 -I have personally reviewed and interpreted outside labs performed by patient's PCP which showed LDL 51 and HDL 6 in April 2023 -Continue with drug management with atorvastatin 80 mg daily, XLIF 180/10 mg daily and Tricor 145 mg daily as well as Vascepa 2 g twice daily with as needed refills -repeat FLP and ALT  3.  Nonrheumatic aortic stenosis -s/p TAVR on 10/13/2021 with 23 mm Renaldo Fiddler are via left carotid artery approach -Follow-up 2D echo 11/13/2021 showed mean aortic valve gradient 10.5 mmHg, DVI 0.46 and V-max 2.28 m/s. -Follow-up in structural heart clinic -Continue aspirin 81 mg daily  4.  Chronic diastolic CHF -He appears euvolemic on exam today -Continue prescription drug management with Lasix 20 mg daily with as needed refills  Followup with me in 1 year  Medication Adjustments/Labs and Tests Ordered: Current medicines are reviewed at length with the patient today.  Concerns regarding medicines are outlined above.  No orders of the defined types were placed in this encounter.  No orders of the defined types were placed in this encounter.   Signed, Fransico Him, MD  05/28/2022 8:38 AM    [place]

## 2022-06-02 ENCOUNTER — Other Ambulatory Visit: Payer: Self-pay | Admitting: Cardiology

## 2022-06-03 ENCOUNTER — Telehealth: Payer: Self-pay | Admitting: Cardiology

## 2022-06-03 NOTE — Telephone Encounter (Signed)
Pt c/o medication issue:  1. Name of Medication:   icosapent Ethyl (VASCEPA) 1 g capsule     Bempedoic Acid-Ezetimibe (NEXLIZET) 180-10 MG TABS    2. How are you currently taking this medication (dosage and times per day)?   3. Are you having a reaction (difficulty breathing--STAT)?   4. What is your medication issue? Pt states he used to have a discount code for these medications but now he has to pay and he cannot afford it. He wants to know if the discounts can be renewed

## 2022-06-04 NOTE — Telephone Encounter (Signed)
Patient is referring to [facility] I have renewed it for him. Called pt and left detailed message per DPR.  CARD NO. [id number]   CARD STATUS Active   BIN [id number]   PCN [id number]   PC GROUP [id number]

## 2022-06-11 ENCOUNTER — Telehealth: Payer: Self-pay | Admitting: Pharmacist

## 2022-06-11 NOTE — Telephone Encounter (Signed)
We received letter from the insurance that they had covred Vascepa for 1 month but they wont be covering it for future refill as it is tire 3 so suggested to try lower tired option instead.  The tire 1 option is Fenofibrate.  Patient is already on fenofibrate since February 2023 and the lipid lab in April 2023 TG was still elevated 243  and in Jan TG level 229 mg/dl  Will apply PA for Vascepa.  Patient is informed

## 2022-06-12 ENCOUNTER — Other Ambulatory Visit: Payer: Self-pay | Admitting: Nurse Practitioner

## 2022-06-22 ENCOUNTER — Other Ambulatory Visit: Payer: Self-pay | Admitting: Cardiology

## 2022-06-23 NOTE — Telephone Encounter (Signed)
Pt called clinic. Looks like Vascepa tier 3 med this year, but he has active [employer] so copay shouldn't really matter since grant will cover his remaining copay. He last filled his vascepa for 90 day supply without issue. Advised him to let us know if he has trouble with his next refill.

## 2022-07-22 ENCOUNTER — Telehealth: Payer: Self-pay | Admitting: Pharmacist

## 2022-07-22 MED ORDER — VASCEPA 1 G PO CAPS: 2.0000 g | ORAL_CAPSULE | Freq: Two times a day (BID) | ORAL | 3 refills | Status: DC

## 2022-07-22 NOTE — Telephone Encounter (Signed)
Patient called stating his pharmacy said his Vascepa was $90. His grant is active Will call pharmacy and make sure they were billing the [employer]

## 2022-07-22 NOTE — Telephone Encounter (Signed)
I called [facility]. They claim that they are billing it the same way they did before and insurance now covers name brand only. Request an Rx for name brand. This was sent. I tried to explain that they need to bill to grant as well. Pharmacist insists they are, but I'm not confident of this.  Patient made aware and was advised to call me if there are still issues.

## 2022-08-06 ENCOUNTER — Other Ambulatory Visit (HOSPITAL_COMMUNITY): Payer: Self-pay | Admitting: Physician Assistant

## 2022-08-06 ENCOUNTER — Ambulatory Visit (HOSPITAL_COMMUNITY)
Admission: RE | Admit: 2022-08-06 | Discharge: 2022-08-06 | Disposition: A | Discharge status: Home / Self Care | Payer: Medicare Other | Source: Ambulatory Visit | Attending: Physician Assistant | Admitting: Physician Assistant

## 2022-08-06 DIAGNOSIS — M79605 Pain in left leg: Secondary | ICD-10-CM | POA: Diagnosis not present

## 2022-08-06 DIAGNOSIS — J309 Allergic rhinitis, unspecified: Secondary | ICD-10-CM | POA: Diagnosis not present

## 2022-08-06 DIAGNOSIS — M79669 Pain in unspecified lower leg: Secondary | ICD-10-CM | POA: Diagnosis not present

## 2022-08-06 DIAGNOSIS — J4520 Mild intermittent asthma, uncomplicated: Secondary | ICD-10-CM | POA: Diagnosis not present

## 2022-09-01 ENCOUNTER — Ambulatory Visit: Payer: Medicare Other | Admitting: Pulmonary Disease

## 2022-09-01 ENCOUNTER — Encounter: Payer: Self-pay | Admitting: Pulmonary Disease

## 2022-09-01 VITALS — BP 128/64 | HR 67 | Ht 66.0 in | Wt 159.0 lb

## 2022-09-01 DIAGNOSIS — J4520 Mild intermittent asthma, uncomplicated: Secondary | ICD-10-CM

## 2022-09-01 DIAGNOSIS — N130 Hydronephrosis with ureteropelvic junction obstruction: Secondary | ICD-10-CM | POA: Diagnosis not present

## 2022-09-01 DIAGNOSIS — R3914 Feeling of incomplete bladder emptying: Secondary | ICD-10-CM | POA: Diagnosis not present

## 2022-09-01 NOTE — Patient Instructions (Signed)
Continue your steroid inhaler daily  Continue albuterol inhaler as needed  Continue montelukast 10mg  daily for allergies  Continue levocetirizine 5mg  daily for allergies  Follow up in 3 months with pulmonary function tests

## 2022-09-01 NOTE — Progress Notes (Signed)
Synopsis: Referred in April 2024 for asthma by Sanda Klein, PA  Subjective:   PATIENT ID: Melvin Dunn. GENDER: male DOB: 1951/02/19, MRN: [id number]  HPI  Chief Complaint  Patient presents with   Consult    Referred by PCP for history of asthma. States he was diagnosed as a child but grew out of it by his 40s. Symptoms have slowly started to come back over the past few years.    Melvin Dunn is a 72 year old male, never smoker with CAD, CHF, HTN, s/p TAVR and prostate cancer who is referred to pulmonary clinic for asthma.   He reports he had asthma in childhood that improved in his teens and 75s. About 10 years ago he started to notice a return of symptoms and was using as needed albuterol. He reports increased symptoms a month ago with cough, wheezing, shortness of breath and chest tightness. He started to feel better over the past week. He was given steroid injection and a prednisone taper and started on an inhaled steroid. He was not treated with an antibiotic. He has issues with seasonal allergies with sinus drainage. He peports he is 85-90% of his normal baseline health status. He has not required albuterol since starting inhaled steroid.  No has no issues for heartburn/reflux and is taking omeprazole.   No smoking. No second hand smoke exposure. 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 He has 3 cats at home.   Past Medical History:  Diagnosis Date   Asthma    no current inhaler use   CAD (coronary artery disease), native coronary artery    LIMA to LAD, SVG to OM1, SVG to RCA-RPL, EVH via right thigh and leg   CHF (congestive heart failure)    Colon polyp    Environmental allergies    History of kidney stones    Hyperlipidemia    Hypertension    Pre-diabetes    Prostate cancer    adenocarcimona, of the prostate, 5% of single core 01/2011-watching and waiting for now   S/P TAVR (transcatheter  aortic valve replacement) 10/13/2021   s/p TAVR with a 35mm Edwards S3UR via the left carotid approach by Dr. Lynnette Caffey & Dr. Laneta Simmers   Seizure    due to lidocaine allergy with prostate biopsy this year   Severe aortic stenosis      Family History  Problem Relation Age of Onset   Alzheimer's disease Mother    Stroke Mother    Skin cancer Mother    Cancer - Lung Father    Cancer Father        asbestos   Heart attack Maternal Grandfather    Breast cancer Neg Hx    Colon cancer Neg Hx    Prostate cancer Neg Hx    Pancreatic cancer Neg Hx      Social History   Socioeconomic History   Marital status: Single    Spouse name: Not on file   Number of children: Not on file   Years of education: Not on file   Highest education level: Not on file  Occupational History    Comment: full time  Tobacco Use   Smoking status: Never   Smokeless tobacco: Never  Vaping Use   Vaping Use: Never used  Substance and Sexual Activity   Alcohol use: No   Drug use: No   Sexual activity: Yes  Other Topics Concern   Not on file  Social History Narrative   Not on file   Social Determinants of Health   Financial Resource Strain: Not on file  Food Insecurity: Not on file  Transportation Needs: Not on file  Physical Activity: Not on file  Stress: Not on file  Social Connections: Not on file  Intimate Partner Violence: Not on file     Allergies  Allergen Reactions   Lidocaine Other (See Comments)    Seizure with prostate biopsy with lidocaine this year, ? Given too much   Promethazine Hcl     stomach upset     Outpatient Medications Prior to Visit  Medication Sig Dispense Refill   albuterol (VENTOLIN HFA) 108 (90 Base) MCG/ACT inhaler Inhale 1-2 puffs into the lungs every 6 (six) hours as needed for wheezing or shortness of breath.     aspirin 81 MG chewable tablet Chew 81 mg by mouth daily.     atorvastatin (LIPITOR) 80 MG tablet TAKE 1 TABLET BY MOUTH EVERY DAY 90 tablet 3    Bempedoic Acid-Ezetimibe (NEXLIZET) 180-10 MG TABS Take 1 tablet by mouth daily. 90 tablet 3   fenofibrate (TRICOR) 145 MG tablet TAKE 1 TABLET BY MOUTH EVERY DAY 90 tablet 3   Fluticasone Furoate (ARNUITY ELLIPTA) 200 MCG/ACT AEPB Inhale 1 puff into the lungs daily.     furosemide (LASIX) 20 MG tablet Take 1 tablet (20 mg total) by mouth daily. 90 tablet 3   levocetirizine (XYZAL) 5 MG tablet Take 5 mg by mouth every evening.     metoprolol tartrate (LOPRESSOR) 50 MG tablet Take 50 mg by mouth 2 (two) times daily.     montelukast (SINGULAIR) 10 MG tablet Take 10 mg by mouth daily.     nitroGLYCERIN (NITROSTAT) 0.4 MG SL tablet Place 1 tablet (0.4 mg total) under the tongue every 5 (five) minutes as needed for chest pain. 25 tablet 3   omeprazole (PRILOSEC) 40 MG capsule Take 40 mg by mouth every morning.     VASCEPA 1 g capsule Take 2 capsules (2 g total) by mouth 2 (two) times daily. 360 capsule 3   Bempedoic Acid-Ezetimibe (NEXLIZET) 180-10 MG TABS Take 1 tablet by mouth daily.     amoxicillin (AMOXIL) 500 MG tablet Take 4 tablets (2,000 mg total) by mouth as directed. 1 hour prior to dental work including cleanings 12 tablet 12   FLOVENT HFA 110 MCG/ACT inhaler Inhale 2 puffs into the lungs in the morning and at bedtime.     imiquimod (ALDARA) 5 % cream Apply 1 Application topically 3 (three) times a week.     Facility-Administered Medications Prior to Visit  Medication Dose Route Frequency Provider Last Rate Last Admin   sodium phosphate (FLEET) 7-19 GM/118ML enema 1 enema  1 enema Rectal Once Heloise Purpura, MD        Review of Systems  Constitutional:  Negative for chills, fever, malaise/fatigue and weight loss.  HENT:  Negative for congestion, sinus pain and sore throat.   Eyes: Negative.   Respiratory:  Positive for cough and shortness of breath. Negative for hemoptysis, sputum production and wheezing.   Cardiovascular:  Negative for chest pain, palpitations, orthopnea, claudication  and leg swelling.  Gastrointestinal:  Negative for abdominal pain, heartburn, nausea and vomiting.  Genitourinary: Negative.   Musculoskeletal:  Negative for joint pain and myalgias.  Skin:  Negative for rash.  Neurological:  Negative for weakness.  Endo/Heme/Allergies: Negative.   Psychiatric/Behavioral: Negative.  Objective:   Vitals:   09/01/22 1423  BP: 128/64  Pulse: 67  SpO2: 97%  Weight: 159 lb (72.1 kg)  Height:  (1.676 m)     Physical Exam Constitutional:      General: He is not in acute distress. HENT:     Head: Normocephalic and atraumatic.  Eyes:     Extraocular Movements: Extraocular movements intact.     Conjunctiva/sclera: Conjunctivae normal.     Pupils: Pupils are equal, round, and reactive to light.  Cardiovascular:     Rate and Rhythm: Normal rate and regular rhythm.     Pulses: Normal pulses.     Heart sounds: Normal heart sounds. No murmur heard. Pulmonary:     Effort: Pulmonary effort is normal.     Breath sounds: Normal breath sounds. No wheezing, rhonchi or rales.  Abdominal:     General: Bowel sounds are normal.     Palpations: Abdomen is soft.  Musculoskeletal:     Right lower leg: No edema.     Left lower leg: No edema.  Lymphadenopathy:     Cervical: No cervical adenopathy.  Skin:    General: Skin is warm and dry.  Neurological:     General: No focal deficit present.     Mental Status: He is alert.  Psychiatric:        Mood and Affect: Mood normal.        Behavior: Behavior normal.        Thought Content: Thought content normal.        Judgment: Judgment normal.    CBC    Component Value Date/Time   WBC 8.1 04/16/2022 0810   RBC 3.85 (L) 04/16/2022 0810   HGB 11.0 (L) 04/16/2022 0810   HGB 12.0 (L) 07/24/2021 1118   HCT 34.0 (L) 04/16/2022 0810   HCT 36.9 (L) 07/24/2021 1118   PLT 202 04/16/2022 0810   PLT 290 07/24/2021 1118   MCV 88.3 04/16/2022 0810   MCV 87 07/24/2021 1118   MCH 28.6 04/16/2022 0810   MCHC  32.4 04/16/2022 0810   RDW 14.7 04/16/2022 0810   RDW 14.5 07/24/2021 1118   LYMPHSABS 1.0 04/16/2022 0810   MONOABS 0.7 04/16/2022 0810   EOSABS 0.9 (H) 04/16/2022 0810   BASOSABS 0.1 04/16/2022 0810      Latest Ref Rng & Units 04/30/2022    8:13 AM 04/16/2022    8:10 AM 03/11/2022   10:25 PM  BMP  Glucose 70 - 99 mg/dL 161  096  045   BUN 8 - 23 mg/dL Creatinine 0.61 - 1.24 mg/dL 4.09  8.11  9.14   Sodium 135 - 145 mmol/L 145  142  142   Potassium 3.5 - 5.1 mmol/L 4.3  3.7  4.4   Chloride 98 - 111 mmol/L 112  110  103   CO2 22 - 32 mmol/L Calcium 8.9 - 10.3 mg/dL 9.9  9.2  78.2    Chest imaging:  PFT:     No data to display          Labs:  Path:  Echo: 11/13/21: LV EF 60-65%. Mild concentric left ventricular hypertrophy. RV systolic function and size is normal.   Heart Catheterization:  Assessment & Plan:   Mild intermittent asthma without complication  Discussion: Devan Danzer is a 72 year old male, never smoker with CAD, CHF, HTN, s/p TAVR and  prostate cancer who is referred to pulmonary clinic for asthma.  He has mild intermittent asthma with recent asthma exacerbation. He has recovered nicely and is feeling much better.   He is to continue on ICS inhaler therapy with arnuity daily and as needed albuterol. We will plan to continue this until follow up visit and determine if he can transition to as needed use.  Follow up in 4 months with pulmonary function tests.  Melody Comas, MD [facility] Office: [phone]   Current Outpatient Medications:    albuterol (VENTOLIN HFA) 108 (90 Base) MCG/ACT inhaler, Inhale 1-2 puffs into the lungs every 6 (six) hours as needed for wheezing or shortness of breath., Disp: , Rfl:    aspirin 81 MG chewable tablet, Chew 81 mg by mouth daily., Disp: , Rfl:    atorvastatin (LIPITOR) 80 MG tablet, TAKE 1 TABLET BY MOUTH EVERY DAY, Disp: 90 tablet, Rfl: 3    Bempedoic Acid-Ezetimibe (NEXLIZET) 180-10 MG TABS, Take 1 tablet by mouth daily., Disp: 90 tablet, Rfl: 3   fenofibrate (TRICOR) 145 MG tablet, TAKE 1 TABLET BY MOUTH EVERY DAY, Disp: 90 tablet, Rfl: 3   Fluticasone Furoate (ARNUITY ELLIPTA) 200 MCG/ACT AEPB, Inhale 1 puff into the lungs daily., Disp: , Rfl:    furosemide (LASIX) 20 MG tablet, Take 1 tablet (20 mg total) by mouth daily., Disp: 90 tablet, Rfl: 3   levocetirizine (XYZAL) 5 MG tablet, Take 5 mg by mouth every evening., Disp: , Rfl:    metoprolol tartrate (LOPRESSOR) 50 MG tablet, Take 50 mg by mouth 2 (two) times daily., Disp: , Rfl:    montelukast (SINGULAIR) 10 MG tablet, Take 10 mg by mouth daily., Disp: , Rfl:    nitroGLYCERIN (NITROSTAT) 0.4 MG SL tablet, Place 1 tablet (0.4 mg total) under the tongue every 5 (five) minutes as needed for chest pain., Disp: 25 tablet, Rfl: 3   omeprazole (PRILOSEC) 40 MG capsule, Take 40 mg by mouth every morning., Disp: , Rfl:    VASCEPA 1 g capsule, Take 2 capsules (2 g total) by mouth 2 (two) times daily., Disp: 360 capsule, Rfl: 3 No current facility-administered medications for this visit.  Facility-Administered Medications Ordered in Other Visits:    sodium phosphate (FLEET) 7-19 GM/118ML enema 1 enema, 1 enema, Rectal, Once, Heloise Purpura, MD

## 2022-09-02 ENCOUNTER — Other Ambulatory Visit (HOSPITAL_COMMUNITY): Payer: Self-pay | Admitting: Urology

## 2022-09-02 DIAGNOSIS — N1330 Unspecified hydronephrosis: Secondary | ICD-10-CM

## 2022-09-06 DIAGNOSIS — Z952 Presence of prosthetic heart valve: Secondary | ICD-10-CM | POA: Diagnosis not present

## 2022-09-06 DIAGNOSIS — Z0000 Encounter for general adult medical examination without abnormal findings: Secondary | ICD-10-CM | POA: Diagnosis not present

## 2022-09-06 DIAGNOSIS — J4520 Mild intermittent asthma, uncomplicated: Secondary | ICD-10-CM | POA: Diagnosis not present

## 2022-09-06 DIAGNOSIS — I5032 Chronic diastolic (congestive) heart failure: Secondary | ICD-10-CM | POA: Diagnosis not present

## 2022-09-06 DIAGNOSIS — I2510 Atherosclerotic heart disease of native coronary artery without angina pectoris: Secondary | ICD-10-CM | POA: Diagnosis not present

## 2022-09-06 DIAGNOSIS — Z86010 Personal history of colonic polyps: Secondary | ICD-10-CM | POA: Diagnosis not present

## 2022-09-06 DIAGNOSIS — E119 Type 2 diabetes mellitus without complications: Secondary | ICD-10-CM | POA: Diagnosis not present

## 2022-09-06 DIAGNOSIS — H6123 Impacted cerumen, bilateral: Secondary | ICD-10-CM | POA: Diagnosis not present

## 2022-09-06 DIAGNOSIS — E7800 Pure hypercholesterolemia, unspecified: Secondary | ICD-10-CM | POA: Diagnosis not present

## 2022-09-06 DIAGNOSIS — E1169 Type 2 diabetes mellitus with other specified complication: Secondary | ICD-10-CM | POA: Diagnosis not present

## 2022-09-14 ENCOUNTER — Ambulatory Visit (HOSPITAL_COMMUNITY)
Admission: RE | Admit: 2022-09-14 | Discharge: 2022-09-14 | Disposition: A | Discharge status: Home / Self Care | Payer: Medicare Other | Source: Ambulatory Visit | Attending: Urology | Admitting: Urology

## 2022-09-14 DIAGNOSIS — N1330 Unspecified hydronephrosis: Secondary | ICD-10-CM | POA: Diagnosis not present

## 2022-09-14 MED ORDER — FUROSEMIDE 10 MG/ML IJ SOLN
36.0000 mg | Freq: Once | INTRAMUSCULAR | Status: AC
  Administered 2022-09-14: 36 mg via INTRAVENOUS

## 2022-09-14 MED ORDER — TECHNETIUM TC 99M MERTIATIDE
4.9000 | Freq: Once | INTRAVENOUS | Status: AC
  Administered 2022-09-14: 4.9 via INTRAVENOUS

## 2022-09-14 MED ORDER — FUROSEMIDE 10 MG/ML IJ SOLN
INTRAMUSCULAR | Status: AC
  Filled 2022-09-14: qty 4

## 2022-09-16 DIAGNOSIS — D508 Other iron deficiency anemias: Secondary | ICD-10-CM | POA: Diagnosis not present

## 2022-09-16 DIAGNOSIS — I5032 Chronic diastolic (congestive) heart failure: Secondary | ICD-10-CM | POA: Diagnosis not present

## 2022-09-16 DIAGNOSIS — E1169 Type 2 diabetes mellitus with other specified complication: Secondary | ICD-10-CM | POA: Diagnosis not present

## 2022-09-16 DIAGNOSIS — J4520 Mild intermittent asthma, uncomplicated: Secondary | ICD-10-CM | POA: Diagnosis not present

## 2022-09-16 DIAGNOSIS — I2510 Atherosclerotic heart disease of native coronary artery without angina pectoris: Secondary | ICD-10-CM | POA: Diagnosis not present

## 2022-09-16 DIAGNOSIS — E7800 Pure hypercholesterolemia, unspecified: Secondary | ICD-10-CM | POA: Diagnosis not present

## 2022-09-17 DIAGNOSIS — N134 Hydroureter: Secondary | ICD-10-CM | POA: Diagnosis not present

## 2022-09-17 DIAGNOSIS — N1339 Other hydronephrosis: Secondary | ICD-10-CM | POA: Diagnosis not present

## 2022-09-17 DIAGNOSIS — N1330 Unspecified hydronephrosis: Secondary | ICD-10-CM | POA: Diagnosis not present

## 2022-09-21 ENCOUNTER — Telehealth: Payer: Self-pay | Admitting: Cardiology

## 2022-09-21 ENCOUNTER — Other Ambulatory Visit: Payer: Self-pay | Admitting: Urology

## 2022-09-21 NOTE — Telephone Encounter (Signed)
   Pre-operative Risk Assessment    Patient Name: Melvin Dunn.  DOB: [birth year] MRN: [id number]      Request for Surgical Clearance    Procedure:   Cystoscopy Right retrograde pyelography right ureteroscopy with possible laser lithotripsy and right ureteral stent   Date of Surgery:  Clearance 10/14/22                                 Surgeon:  Dr. Heloise Purpura  Surgeon's Group or Practice Name:  [facility]  Phone number:  [phone] Fax number:  [phone]   Type of Clearance Requested:   - Medical  - Pharmacy:  Hold Aspirin 5 days   Type of Anesthesia:  General    Additional requests/questions:    [name]   09/21/2022, 1:03 PM

## 2022-09-21 NOTE — Telephone Encounter (Signed)
   Name: Melvin Dunn.  DOB: 1950-09-09  MRN: [id number]  Primary Cardiologist: Armanda Magic, MD   Preoperative team, please contact this patient and set up a phone call appointment for further preoperative risk assessment. Please obtain consent and complete medication review. Thank you for your help.  I confirm that guidance regarding antiplatelet and oral anticoagulation therapy has been completed and, if necessary, noted below.  Ideally aspirin should be continued without interruption, however if the bleeding risk is too great, aspirin may be held for 5-7 days prior to surgery. Please resume aspirin post operatively when it is felt to be safe from a bleeding standpoint.     Carlos Levering, NP 09/21/2022, 4:14 PM [facility]

## 2022-09-22 ENCOUNTER — Telehealth: Payer: Self-pay | Admitting: *Deleted

## 2022-09-22 NOTE — Telephone Encounter (Signed)
S/w pt is agreeable to telephone clearance.    Patient Consent for Virtual Visit         Melvin Dunn. has provided verbal consent on 09/22/2022 for a virtual visit (video or telephone).   CONSENT FOR VIRTUAL VISIT FOR:  Melvin Dunn.  By participating in this virtual visit I agree to the following:  I hereby voluntarily request, consent and authorize [facility] and its employed or contracted physicians, physician assistants, nurse practitioners or other licensed health care professionals (the Practitioner), to provide me with telemedicine health care services (the "Services") as deemed necessary by the treating Practitioner. I acknowledge and consent to receive the Services by the Practitioner via telemedicine. I understand that the telemedicine visit will involve communicating with the Practitioner through live audiovisual communication technology and the disclosure of certain medical information by electronic transmission. I acknowledge that I have been given the opportunity to request an in-person assessment or other available alternative prior to the telemedicine visit and am voluntarily participating in the telemedicine visit.  I understand that I have the right to withhold or withdraw my consent to the use of telemedicine in the course of my care at any time, without affecting my right to future care or treatment, and that the Practitioner or I may terminate the telemedicine visit at any time. I understand that I have the right to inspect all information obtained and/or recorded in the course of the telemedicine visit and may receive copies of available information for a reasonable fee.  I understand that some of the potential risks of receiving the Services via telemedicine include:  Delay or interruption in medical evaluation due to technological equipment failure or disruption; Information transmitted may not be sufficient (e.g. poor resolution of images) to allow for  appropriate medical decision making by the Practitioner; and/or  In rare instances, security protocols could fail, causing a breach of personal health information.  Furthermore, I acknowledge that it is my responsibility to provide information about my medical history, conditions and care that is complete and accurate to the best of my ability. I acknowledge that Practitioner's advice, recommendations, and/or decision may be based on factors not within their control, such as incomplete or inaccurate data provided by me or distortions of diagnostic images or specimens that may result from electronic transmissions. I understand that the practice of medicine is not an exact science and that Practitioner makes no warranties or guarantees regarding treatment outcomes. I acknowledge that a copy of this consent can be made available to me via my patient portal [facility] MyChart), or I can request a printed copy by calling the office of [facility].    I understand that my insurance will be billed for this visit.   I have read or had this consent read to me. I understand the contents of this consent, which adequately explains the benefits and risks of the Services being provided via telemedicine.  I have been provided ample opportunity to ask questions regarding this consent and the Services and have had my questions answered to my satisfaction. I give my informed consent for the services to be provided through the use of telemedicine in my medical care

## 2022-09-28 ENCOUNTER — Ambulatory Visit: Payer: Medicare Other | Attending: Cardiology

## 2022-09-28 DIAGNOSIS — Z01810 Encounter for preprocedural cardiovascular examination: Secondary | ICD-10-CM

## 2022-09-28 NOTE — Progress Notes (Signed)
Virtual Visit via Telephone Note   Because of Melvin VEGTER Jr.'s co-morbid illnesses, he is at least at moderate risk for complications without adequate follow up.  This format is felt to be most appropriate for this patient at this time.  The patient did not have access to video technology/had technical difficulties with video requiring transitioning to audio format only (telephone).  All issues noted in this document were discussed and addressed.  No physical exam could be performed with this format.  Please refer to the patient's chart for his consent to telehealth for Melvin Dunn.  Evaluation Performed:  Preoperative cardiovascular risk assessment _____________   Date:  09/28/2022   Patient ID:  Melvin Kos., DOB Aug 10, 1950, MRN [id number] Patient Location:  Home Provider location:   Office  Primary Care Provider:  Johny Blamer, MD Primary Cardiologist:  Armanda Magic, MD  Chief Complaint / Patient Profile   72 y.o. y/o male with a h/o dyslipidemia, diabetes mellitus and AAS CAD with catheter revealing critical left main stenosis and multivessel coronary disease with normal LVEF status post CABG with LIMA to LAD, SVG to OM, SVG to distal RCA/PL who is pending cystoscopy and possible stent and presents today for telephonic preoperative cardiovascular risk assessment.  History of Present Illness    Melvin Hariri. is a 72 y.o. male who presents via audio/video conferencing for a telehealth visit today.  Pt was last seen in cardiology clinic on 05/28/2022 by Dr. Mayford Knife.  At that time Melvin Kos. was doing well .  The patient is now pending procedure as outlined above. Since his last visit, he tells me that he does not have any chest pain.  He does struggle with shortness of breath occasionally due to his asthma.  He does have stairs to get into his house (about 15).  No issues there.  He fell off of a platform when it collapsed 15 to 20 years ago and broke both of his  tibias.  He does still have some residual leg pain from time to time.  He does meet 4 METS on the DASI.   Ideally aspirin should be continued without interruption, however if the bleeding risk is too great, aspirin may be held for 5 days prior to surgery. Please resume aspirin post operatively when it is felt to be safe from a bleeding standpoint.   Past Medical History    Past Medical History:  Diagnosis Date   Asthma    no current inhaler use   CAD (coronary artery disease), native coronary artery    LIMA to LAD, SVG to OM1, SVG to RCA-RPL, EVH via right thigh and leg   CHF (congestive heart failure) (HCC)    Colon polyp    Environmental allergies    History of kidney stones    Hyperlipidemia    Hypertension    Pre-diabetes    Prostate cancer (HCC)    adenocarcimona, of the prostate, 5% of single core 01/2011-watching and waiting for now   S/P TAVR (transcatheter aortic valve replacement) 10/13/2021   s/p TAVR with a 23mm Edwards S3UR via the left carotid approach by Dr. Lynnette Caffey & Dr. Laneta Simmers   Seizure [facility])    due to lidocaine allergy with prostate biopsy this year   Severe aortic stenosis    Past Surgical History:  Procedure Laterality Date   CORONARY ARTERY BYPASS GRAFT N/A 02/13/2014   Procedure: CORONARY ARTERY BYPASS GRAFTING (CABG) TIMES FOUR USING LEFT INTERNAL  MAMMARY ARTERY AND RIGHT SAPHENOUS LEG VEIN HARVESTED ENDOSCOPICALLY;  Surgeon: Purcell Nails, MD;  Location: [facility] OR;  Service: Open Heart Surgery;  Laterality: N/A;   CYSTOSCOPY WITH LITHOLAPAXY N/A 03/17/2020   Procedure: CYSTOSCOPY WITH LITHOLAPAXY;  Surgeon: Heloise Purpura, MD;  [facility];  Service: Urology;  Laterality: N/A;   CYSTOSCOPY/URETEROSCOPY/HOLMIUM LASER/STENT PLACEMENT Right 04/01/2022   Procedure: CYSTOSCOPY/RIGHT URETEROSCOP/RIGHT RETROGRADE PYELOGRAM;  Surgeon: Heloise Purpura, MD;  Location: [facility] ORS;  Service: Urology;  Laterality: Right;  60 MINUTES NEEDED FOR CASE    CYSTOSCOPY/URETEROSCOPY/HOLMIUM LASER/STENT PLACEMENT Right 05/03/2022   Procedure: CYSTOSCOPY/RIGHT URETEROSCOPY/HOLMIUM LASER/RIGHT STENT PLACEMENT;  Surgeon: Heloise Purpura, MD;  Location: [facility] OR[facility];  Service: Urology;  Laterality: Right;  90 MINUTES NEEDED FOR CASE   cytoscopy  15 yrs ago   EXTRACORPOREAL SHOCK WAVE LITHOTRIPSY  15 yrs ago   EYE SURGERY Bilateral 2015   cataracts   GOLD SEED IMPLANT N/A 03/17/2020   Procedure: GOLD SEED IMPLANT;  Surgeon: Heloise Purpura, MD;  Location: [facility];  Service: Urology;  Laterality: N/A;   INTRAOPERATIVE TRANSESOPHAGEAL ECHOCARDIOGRAM N/A 02/13/2014   Procedure: INTRAOPERATIVE TRANSESOPHAGEAL ECHOCARDIOGRAM;  Surgeon: Purcell Nails, MD;  Location: [facility] OR;  Service: Open Heart Surgery;  Laterality: N/A;   INTRAOPERATIVE TRANSESOPHAGEAL ECHOCARDIOGRAM N/A 10/13/2021   Procedure: INTRAOPERATIVE TRANSESOPHAGEAL ECHOCARDIOGRAM;  Surgeon: Orbie Pyo, MD;  Location: [facility] OR;  Service: Open Heart Surgery;  Laterality: N/A;   IR URETERAL STENT RIGHT NEW ACCESS W/SEP NEPHROSTOMY CATH  04/16/2022   LEFT HEART CATHETERIZATION WITH CORONARY ANGIOGRAM N/A 02/12/2014   Procedure: LEFT HEART CATHETERIZATION WITH CORONARY ANGIOGRAM;  Surgeon: Micheline Chapman, MD;  Location: [facility] CATH LAB;  Service: Cardiovascular;  Laterality: N/A;   left leg surgery for fracture  2004   plates inserted   PROSTATE BIOPSY  06/2019   RIGHT/LEFT HEART CATH AND CORONARY/GRAFT ANGIOGRAPHY N/A 07/31/2021   Procedure: RIGHT/LEFT HEART CATH AND CORONARY/GRAFT ANGIOGRAPHY;  Surgeon: Orbie Pyo, MD;  Location: [facility] INVASIVE CV LAB;  Service: Cardiovascular;  Laterality: N/A;   SPACE OAR INSTILLATION N/A 03/17/2020   Procedure: SPACE OAR INSTILLATION;  Surgeon: Heloise Purpura, MD;  Location: [facility];  Service: Urology;  Laterality: N/A;   TRANSCATHETER AORTIC VALVE REPLACEMENT, CAROTID Left 10/13/2021   Procedure: Transcatheter Aortic Valve  Replacement-Carotid;  Surgeon: Orbie Pyo, MD;  Location: [facility] OR;  Service: Open Heart Surgery;  Laterality: Left;   ULTRASOUND GUIDANCE FOR VASCULAR ACCESS Right 10/13/2021   Procedure: ULTRASOUND GUIDANCE FOR VASCULAR ACCESS;  Surgeon: Orbie Pyo, MD;  Location: [facility] OR;  Service: Open Heart Surgery;  Laterality: Right;    Allergies  Allergies  Allergen Reactions   Lidocaine Other (See Comments)    Seizure with prostate biopsy with lidocaine this year, ? Given too much   Promethazine Hcl     stomach upset    Home Medications    Prior to Admission medications   Medication Sig Start Date End Date Taking? Authorizing Provider  albuterol (VENTOLIN HFA) 108 (90 Base) MCG/ACT inhaler Inhale 1-2 puffs into the lungs every 6 (six) hours as needed for wheezing or shortness of breath.    [provider]  aspirin 81 MG chewable tablet Chew 81 mg by mouth daily.    [provider]  atorvastatin (LIPITOR) 80 MG tablet TAKE 1 TABLET BY MOUTH EVERY DAY 06/22/22   Quintella Reichert, MD  Bempedoic Acid-Ezetimibe (NEXLIZET) 180-10 MG TABS Take 1 tablet by mouth daily. 11/23/21  Quintella Reichert, MD  fenofibrate (TRICOR) 145 MG tablet TAKE 1 TABLET BY MOUTH EVERY DAY 06/14/22   [name], Cornelious Bryant, MD  Fluticasone Furoate (ARNUITY ELLIPTA) 200 MCG/ACT AEPB Inhale 1 puff into the lungs daily.    [provider]  furosemide (LASIX) 20 MG tablet Take 1 tablet (20 mg total) by mouth daily. 11/13/21   Janetta Hora, PA-C  levocetirizine (XYZAL) 5 MG tablet Take 5 mg by mouth every evening. 06/12/19   [provider]  metFORMIN (GLUCOPHAGE) 500 MG tablet Take 500 mg by mouth daily with breakfast. 09/13/22   [provider]  metoprolol tartrate (LOPRESSOR) 50 MG tablet Take 50 mg by mouth 2 (two) times daily. 05/20/17   [provider]  montelukast (SINGULAIR) 10 MG tablet Take 10 mg by mouth daily. 08/06/22   [provider]  nitroGLYCERIN  (NITROSTAT) 0.4 MG SL tablet Place 1 tablet (0.4 mg total) under the tongue every 5 (five) minutes as needed for chest pain. 01/24/15   Quintella Reichert, MD  VASCEPA 1 g capsule Take 2 capsules (2 g total) by mouth 2 (two) times daily. 07/22/22   Quintella Reichert, MD    Physical Exam    Vital Signs:  Melvin Kos. does not have vital signs available for review today.  Given telephonic nature of communication, physical exam is limited. AAOx3. NAD. Normal affect.  Speech and respirations are unlabored.  Accessory Clinical Findings    None  Assessment & Plan    1.  Preoperative Cardiovascular Risk Assessment:  Mr. Wiltgen perioperative risk of a major cardiac event is 0.9% according to the Revised Cardiac Risk Index (RCRI).  Therefore, he is at low risk for perioperative complications.   His functional capacity is good at 5.07 METs according to the [facility] Activity Status Index (DASI). Recommendations: According to ACC/AHA guidelines, no further cardiovascular testing needed.  The patient may proceed to surgery at acceptable risk.   Antiplatelet and/or Anticoagulation Recommendations: Aspirin can be held for 5 days prior to his surgery.  Please resume Aspirin post operatively when it is felt to be safe from a bleeding standpoint.   The patient was advised that if he develops new symptoms prior to surgery to contact our office to arrange for a follow-up visit, and he verbalized understanding.   A copy of this note will be routed to requesting surgeon.  Time:   Today, I have spent 10 minutes with the patient with telehealth technology discussing medical history, symptoms, and management plan.     Sharlene Dory, PA-C  09/28/2022, 9:24 AM

## 2022-09-30 DIAGNOSIS — N201 Calculus of ureter: Secondary | ICD-10-CM | POA: Diagnosis not present

## 2022-09-30 DIAGNOSIS — N130 Hydronephrosis with ureteropelvic junction obstruction: Secondary | ICD-10-CM | POA: Diagnosis not present

## 2022-09-30 DIAGNOSIS — Z01818 Encounter for other preprocedural examination: Secondary | ICD-10-CM | POA: Diagnosis not present

## 2022-10-01 NOTE — Patient Instructions (Addendum)
SURGICAL WAITING ROOM VISITATION Patients having surgery or a procedure may have no more than 2 support people in the waiting area - these visitors may rotate.    Children under the age of 64 must have an adult with them who is not the patient.  If the patient needs to stay at the hospital during part of their recovery, the visitor guidelines for inpatient rooms apply. Pre-op nurse will coordinate an appropriate time for 1 support person to accompany patient in pre-op.  This support person may not rotate.    Please refer to the [facility] website for the visitor guidelines for Inpatients (after your surgery is over and you are in a regular room).       Your procedure is scheduled on: 10-14-22   Report to [facility] Main Entrance    Report to admitting at 8:15 AM   Call this number if you have problems the morning of surgery [phone]   Do not eat food or drink liquids :After Midnight.           If you have questions, please contact your surgeon's office.   FOLLOW  ANY ADDITIONAL PRE OP INSTRUCTIONS YOU RECEIVED FROM YOUR SURGEON'S OFFICE!!!     Oral Hygiene is also important to reduce your risk of infection.                                    Remember - BRUSH YOUR TEETH THE MORNING OF SURGERY WITH YOUR REGULAR TOOTHPASTE   Do NOT smoke after Midnight   Take these medicines the morning of surgery with A SIP OF WATER:   Atorvastatin  Nexlizet  Fenofibrate  Metoprolol  Singulair  Vascepa  Tylenol if needed  Okay to use inhalers and nasal spray  How to Manage Your Diabetes Before and After Surgery  Why is it important to control my blood sugar before and after surgery? Improving blood sugar levels before and after surgery helps healing and can limit problems. A way of improving blood sugar control is eating a healthy diet by:  Eating less sugar and carbohydrates  Increasing activity/exercise  Talking with your doctor about reaching your blood sugar  goals High blood sugars (greater than 180 mg/dL) can raise your risk of infections and slow your recovery, so you will need to focus on controlling your diabetes during the weeks before surgery. Make sure that the doctor who takes care of your diabetes knows about your planned surgery including the date and location.  How do I manage my blood sugar before surgery? Check your blood sugar at least 4 times a day, starting 2 days before surgery, to make sure that the level is not too high or low. Check your blood sugar the morning of your surgery when you wake up and every 2 hours until you get to the Short Stay unit. If your blood sugar is less than 70 mg/dL, you will need to treat for low blood sugar: Do not take insulin. Treat a low blood sugar (less than 70 mg/dL) with  cup of clear juice (cranberry or apple), 4 glucose tablets, OR glucose gel. Recheck blood sugar in 15 minutes after treatment (to make sure it is greater than 70 mg/dL). If your blood sugar is not greater than 70 mg/dL on recheck, call [phone] for further instructions. Report your blood sugar to the short stay nurse when you get to Short Stay.  If you are admitted to the hospital after surgery: Your blood sugar will be checked by the staff and you will probably be given insulin after surgery (instead of oral diabetes medicines) to make sure you have good blood sugar levels. The goal for blood sugar control after surgery is 80-180 mg/dL.   WHAT DO I DO ABOUT MY DIABETES MEDICATION?  Do not take oral diabetes medicines (pills) the morning of surgery.  DO NOT TAKE THE FOLLOWING 7 DAYS PRIOR TO SURGERY: Ozempic, Wegovy, Rybelsus (Semaglutide), Byetta (exenatide), Bydureon (exenatide ER), Victoza, Saxenda (liraglutide), or Trulicity (dulaglutide) Mounjaro (Tirzepatide) Adlyxin (Lixisenatide), Polyethylene Glycol Loxenatide.   Reviewed and Endorsed by [facility] Patient Education Committee, August 2015  Bring CPAP mask and  tubing day of surgery.                              You may not have any metal on your body including jewelry, and body piercing             Do not wear lotions, powders, cologne, or deodorant              Men may shave face and neck.   Do not bring valuables to the hospital. [facility] IS NOT RESPONSIBLE   FOR VALUABLES.   Contacts, dentures or bridgework may not be worn into surgery.  DO NOT BRING YOUR HOME MEDICATIONS TO THE HOSPITAL. PHARMACY WILL DISPENSE MEDICATIONS LISTED ON YOUR MEDICATION LIST TO YOU DURING YOUR ADMISSION IN THE HOSPITAL!    Patients discharged on the day of surgery will not be allowed to drive home.  Someone NEEDS to stay with you for the first 24 hours after anesthesia.   Special Instructions: Bring a copy of your healthcare power of attorney and living will documents the day of surgery if you haven't scanned them before.              Please read over the following fact sheets you were given: IF YOU HAVE QUESTIONS ABOUT YOUR PRE-OP INSTRUCTIONS PLEASE CALL [phone] [facility]  If you received a COVID test during your pre-op visit  it is requested that you wear a mask when out in public, stay away from anyone that may not be feeling well and notify your surgeon if you develop symptoms. If you test positive for Covid or have been in contact with anyone that has tested positive in the last 10 days please notify you surgeon.  [facility] - Preparing for Surgery Before surgery, you can play an important role.  Because skin is not sterile, your skin needs to be as free of germs as possible.  You can reduce the number of germs on your skin by washing with CHG (chlorahexidine gluconate) soap before surgery.  CHG is an antiseptic cleaner which kills germs and bonds with the skin to continue killing germs even after washing. Please DO NOT use if you have an allergy to CHG or antibacterial soaps.  If your skin becomes reddened/irritated stop using the CHG and inform your  nurse when you arrive at Short Stay. Do not shave (including legs and underarms) for at least 48 hours prior to the first CHG shower.  You may shave your face/neck.  Please follow these instructions carefully:  1.  Shower with CHG Soap the night before surgery and the  morning of surgery.  2.  If you choose to wash your hair, wash your hair first as  usual with your normal  shampoo.  3.  After you shampoo, rinse your hair and body thoroughly to remove the shampoo.                             4.  Use CHG as you would any other liquid soap.  You can apply chg directly to the skin and wash.  Gently with a scrungie or clean washcloth.  5.  Apply the CHG Soap to your body ONLY FROM THE NECK DOWN.   Do   not use on face/ open                           Wound or open sores. Avoid contact with eyes, ears mouth and   genitals (private parts).                       Wash face,  Genitals (private parts) with your normal soap.             6.  Wash thoroughly, paying special attention to the area where your    surgery  will be performed.  7.  Thoroughly rinse your body with warm water from the neck down.  8.  DO NOT shower/wash with your normal soap after using and rinsing off the CHG Soap.                9.  Pat yourself dry with a clean towel.            10.  Wear clean pajamas.            11.  Place clean sheets on your bed the night of your first shower and do not  sleep with pets. Day of Surgery : Do not apply any lotions/deodorants the morning of surgery.  Please wear clean clothes to the hospital/surgery center.  FAILURE TO FOLLOW THESE INSTRUCTIONS MAY RESULT IN THE CANCELLATION OF YOUR SURGERY  PATIENT SIGNATURE_________________________________  NURSE SIGNATURE__________________________________  ________________________________________________________________________

## 2022-10-01 NOTE — Progress Notes (Addendum)
COVID Vaccine Completed:  Date of COVID positive in last 90 days:  PCP - Johny Blamer, MD Cardiologist - Armanda Magic, MD Pulmonologist - Melody Comas, MD  Cardiac clearance in Epic dated 09-28-22 by Jari Favre, PA-C  Chest x-ray -  EKG - 10-04-22 Epic Stress Test - 07-10-18 Epic ECHO - 11-13-21 Epic Cardiac Cath - 07-31-21 Epic Pacemaker/ICD device last checked: Spinal Cord Stimulator: Coronary CT - 08-04-21 Epic  Bowel Prep -   Sleep Study -  CPAP -   Fasting Blood Sugar -  Checks Blood Sugar _____ times a day  Last dose of GLP1 agonist-  N/A GLP1 instructions:  N/A   Last dose of SGLT-2 inhibitors-  N/A SGLT-2 instructions: N/A   Blood Thinner Instructions:  Time Aspirin Instructions:  ASA 81.  Continue per cardiology, may hold x5 days Last Dose:  Activity level:  Can go up a flight of stairs and perform activities of daily living without stopping and without symptoms of chest pain or shortness of breath.  Able to exercise without symptoms  Unable to go up a flight of stairs without symptoms of     Anesthesia review: CAD with hx of CABG and TAVR,, severe aortic stenosis, CHF, HTN.    Seizure disorder  Patient denies shortness of breath, fever, cough and chest pain at PAT appointment  Patient verbalized understanding of instructions that were given to them at the PAT appointment. Patient was also instructed that they will need to review over the PAT instructions again at home before surgery.

## 2022-10-04 ENCOUNTER — Encounter (HOSPITAL_COMMUNITY)
Admission: RE | Admit: 2022-10-04 | Discharge: 2022-10-04 | Disposition: A | Discharge status: Home / Self Care | Payer: Medicare Other | Source: Ambulatory Visit | Attending: Anesthesiology | Admitting: Anesthesiology

## 2022-10-04 DIAGNOSIS — E119 Type 2 diabetes mellitus without complications: Secondary | ICD-10-CM

## 2022-10-05 NOTE — Progress Notes (Signed)
[facility]   MULTIDISCIPLINARY HEART VALVE CLINIC                                     Cardiology Office Note:    Date:  10/06/2022   ID:  Melvin Kos., DOB 1950-07-14, MRN [id number]  PCP:  Melvin Blamer, MD  [facility] Cardiologist:  Melvin Magic, MD  / Dr. Lynnette Dunn & Dr. Laneta Dunn (TAVR) [facility] Electrophysiologist:  None   Referring MD: Melvin Blamer, MD   1 year s/p TAVR  History of Present Illness:    Melvin Piere. is a 72 y.o. male with a hx of CAD s/p CABG x4 (2015), HLD, DM2, HTN, chronic diastolic CHF, prostate cancer and severe aortic stenosis s/p TAVR (10/13/21) who presents to clinic for follow up.  Melvin Dunn developed exertional shortness of breath and fatigue. Echocardiogram on 07/03/2021 showed EF 65-70%, and severe AS with a mean grad , peak grad 85.49mmHg, AVA 0.67cm2, DVI 0.24, SVI 36, mild AI. [facility] 07/31/21 showed a patent LIMA to LAD and vein graft to second obtuse marginal. The sequential vein graft to the right coronary artery and R PLV branch had serial high-grade stenoses but medical therapy was recommended given lack of angina.   He was evaluated by the multidisciplinary valve team and underwent  successful TAVR with a 23 mm Edwards Sapien 3 Ultra Resilia 23 THV via the left carotid approach on 10/13/21. Carotid approach was chosen given plaque in the aorta on pre TAVR CTs and concern for potential emboli. Post operative echo showed EF 65-70%, normally functioning TAVR with a mean gradient of 16 mmHg and mild AR. He was discharged home on a baby aspirin. 1 month echo showed EF 60%, normally functioning TAVR with a mean gradient of 10.5 mm hg and no PVL as well as elevated filling pressures on echo. He complained for worsening dyspnea and I started him on Lasix 20mg  daily which led to an improvement in breathing.  Today the patient presents to clinic for follow up. No CP or SOB. No LE edema, orthopnea or PND. No dizziness or syncope. No  blood in stool or urine. No palpitations. Dealing with kidney stones which has been difficult.   Past Medical History:  Diagnosis Date   Asthma    no current inhaler use   CAD (coronary artery disease), native coronary artery    LIMA to LAD, SVG to OM1, SVG to RCA-RPL, EVH via right thigh and leg   CHF (congestive heart failure) (HCC)    Colon polyp    Environmental allergies    History of kidney stones    Hyperlipidemia    Hypertension    Pre-diabetes    Prostate cancer (HCC)    adenocarcimona, of the prostate, 5% of single core 01/2011-watching and waiting for now   S/P TAVR (transcatheter aortic valve replacement) 10/13/2021   s/p TAVR with a 23mm Edwards S3UR via the left carotid approach by Dr. Lynnette Dunn & Dr. Laneta Dunn   Seizure [facility])    due to lidocaine allergy with prostate biopsy this year   Severe aortic stenosis     Past Surgical History:  Procedure Laterality Date   CORONARY ARTERY BYPASS GRAFT N/A 02/13/2014   Procedure: CORONARY ARTERY BYPASS GRAFTING (CABG) TIMES FOUR USING LEFT INTERNAL MAMMARY ARTERY AND RIGHT SAPHENOUS LEG VEIN HARVESTED ENDOSCOPICALLY;  Surgeon: Purcell Nails, MD;  Location:  [facility] OR;  Service: Open Heart Surgery;  Laterality: N/A;   CYSTOSCOPY WITH LITHOLAPAXY N/A 03/17/2020   Procedure: CYSTOSCOPY WITH LITHOLAPAXY;  Surgeon: Heloise Purpura, MD;  Location: [facility];  Service: Urology;  Laterality: N/A;   CYSTOSCOPY/URETEROSCOPY/HOLMIUM LASER/STENT PLACEMENT Right 04/01/2022   Procedure: CYSTOSCOPY/RIGHT URETEROSCOP/RIGHT RETROGRADE PYELOGRAM;  Surgeon: Heloise Purpura, MD;  Location: [facility] ORS;  Service: Urology;  Laterality: Right;  60 MINUTES NEEDED FOR CASE   CYSTOSCOPY/URETEROSCOPY/HOLMIUM LASER/STENT PLACEMENT Right 05/03/2022   Procedure: CYSTOSCOPY/RIGHT URETEROSCOPY/HOLMIUM LASER/RIGHT STENT PLACEMENT;  Surgeon: Heloise Purpura, MD;  Location: [facility] ORS;  Service: Urology;  Laterality: Right;  90 MINUTES NEEDED FOR CASE   cytoscopy   15 yrs ago   EXTRACORPOREAL SHOCK WAVE LITHOTRIPSY  15 yrs ago   EYE SURGERY Bilateral 2015   cataracts   GOLD SEED IMPLANT N/A 03/17/2020   Procedure: GOLD SEED IMPLANT;  Surgeon: Heloise Purpura, MD;  Location: [facility];  Service: Urology;  Laterality: N/A;   INTRAOPERATIVE TRANSESOPHAGEAL ECHOCARDIOGRAM N/A 02/13/2014   Procedure: INTRAOPERATIVE TRANSESOPHAGEAL ECHOCARDIOGRAM;  Surgeon: Purcell Nails, MD;  Location: [facility] OR;  Service: Open Heart Surgery;  Laterality: N/A;   INTRAOPERATIVE TRANSESOPHAGEAL ECHOCARDIOGRAM N/A 10/13/2021   Procedure: INTRAOPERATIVE TRANSESOPHAGEAL ECHOCARDIOGRAM;  Surgeon: Orbie Pyo, MD;  Location: [facility] OR;  Service: Open Heart Surgery;  Laterality: N/A;   IR URETERAL STENT RIGHT NEW ACCESS W/SEP NEPHROSTOMY CATH  04/16/2022   LEFT HEART CATHETERIZATION WITH CORONARY ANGIOGRAM N/A 02/12/2014   Procedure: LEFT HEART CATHETERIZATION WITH CORONARY ANGIOGRAM;  Surgeon: Micheline Chapman, MD;  Location: [facility] CATH LAB;  Service: Cardiovascular;  Laterality: N/A;   left leg surgery for fracture  2004   plates inserted   PROSTATE BIOPSY  06/2019   RIGHT/LEFT HEART CATH AND CORONARY/GRAFT ANGIOGRAPHY N/A 07/31/2021   Procedure: RIGHT/LEFT HEART CATH AND CORONARY/GRAFT ANGIOGRAPHY;  Surgeon: Orbie Pyo, MD;  Location: [facility] INVASIVE CV LAB;  Service: Cardiovascular;  Laterality: N/A;   SPACE OAR INSTILLATION N/A 03/17/2020   Procedure: SPACE OAR INSTILLATION;  Surgeon: Heloise Purpura, MD;  Location: [facility];  Service: Urology;  Laterality: N/A;   TRANSCATHETER AORTIC VALVE REPLACEMENT, CAROTID Left 10/13/2021   Procedure: Transcatheter Aortic Valve Replacement-Carotid;  Surgeon: Orbie Pyo, MD;  Location: [facility] OR;  Service: Open Heart Surgery;  Laterality: Left;   ULTRASOUND GUIDANCE FOR VASCULAR ACCESS Right 10/13/2021   Procedure: ULTRASOUND GUIDANCE FOR VASCULAR ACCESS;  Surgeon: Orbie Pyo, MD;  Location: [facility] OR;   Service: Open Heart Surgery;  Laterality: Right;    Current Medications: Current Meds  Medication Sig   albuterol (VENTOLIN HFA) 108 (90 Base) MCG/ACT inhaler Inhale 1-2 puffs into the lungs every 6 (six) hours as needed for wheezing or shortness of breath.   aspirin 81 MG chewable tablet Chew 81 mg by mouth daily.   atorvastatin (LIPITOR) 80 MG tablet TAKE 1 TABLET BY MOUTH EVERY DAY   Bempedoic Acid-Ezetimibe (NEXLIZET) 180-10 MG TABS Take 1 tablet by mouth daily.   fenofibrate (TRICOR) 145 MG tablet TAKE 1 TABLET BY MOUTH EVERY DAY   fluticasone (FLONASE) 50 MCG/ACT nasal spray Place 1 spray into both nostrils daily.   Fluticasone Furoate (ARNUITY ELLIPTA) 200 MCG/ACT AEPB Inhale 1 puff into the lungs daily.   furosemide (LASIX) 20 MG tablet Take 1 tablet (20 mg total) by mouth daily.   levocetirizine (XYZAL) 5 MG tablet Take 5 mg by mouth every evening.   metFORMIN (GLUCOPHAGE) 500 MG tablet Take 500 mg by  mouth daily with breakfast.   metoprolol tartrate (LOPRESSOR) 50 MG tablet Take 50 mg by mouth 2 (two) times daily.   montelukast (SINGULAIR) 10 MG tablet Take 10 mg by mouth daily.   nitroGLYCERIN (NITROSTAT) 0.4 MG SL tablet Place 1 tablet (0.4 mg total) under the tongue every 5 (five) minutes as needed for chest pain.   VASCEPA 1 g capsule Take 2 capsules (2 g total) by mouth 2 (two) times daily.     Allergies:   Lidocaine and Promethazine hcl   Social History   Socioeconomic History   Marital status: Married    Spouse name: Not on file   Number of children: Not on file   Years of education: Not on file   Highest education level: Not on file  Occupational History    Comment: full time  Tobacco Use   Smoking status: Never   Smokeless tobacco: Never  Vaping Use   Vaping Use: Never used  Substance and Sexual Activity   Alcohol use: No   Drug use: No   Sexual activity: Yes  Other Topics Concern   Not on file  Social History Narrative   Not on file   Social  Determinants of Health   Financial Resource Strain: Not on file  Food Insecurity: Not on file  Transportation Needs: Not on file  Physical Activity: Not on file  Stress: Not on file  Social Connections: Not on file     Family History: The patient's family history includes Alzheimer's disease in his mother; Cancer in his father; Cancer - Lung in his father; Heart attack in his maternal grandfather; Skin cancer in his mother; Stroke in his mother. There is no history of Breast cancer, Colon cancer, Prostate cancer, or Pancreatic cancer.  ROS:   Please see the history of present illness.    All other systems reviewed and are negative.  EKGs/Labs/Other Studies Reviewed:    The following studies were reviewed today:  TAVR OPERATIVE NOTE     Date of Procedure:                10/13/2021   Preoperative Diagnosis:      Severe Aortic Stenosis    Postoperative Diagnosis:    Same    Procedure:        Transcatheter Aortic Valve Replacement - Left Common Carotid Artery Approach            Edwards Sapien 3 Ultra Resilia THV (size 23 mm, model # 9755RSL, serial # [id number])           Co-Surgeons:                        Alleen Borne, MD and Alverda Skeans, MD   Anesthesiologist:                  Hyman Bower, MD   Echocardiographer:              Burna Forts, MD   Pre-operative Echo Findings: Severe aortic stenosis Normal left ventricular systolic function   Post-operative Echo Findings: Trivial paravalvular leak Normal left ventricular systolic function   _____________     Echo 10/13/21:  IMPRESSIONS   1. Left ventricular ejection fraction, by estimation, is 65 to 70%. The  left ventricle has normal function. The left ventricle has no regional  wall motion abnormalities. There is mild concentric left ventricular  hypertrophy. Left ventricular diastolic  parameters are consistent with Grade II  diastolic dysfunction  (pseudonormalization). Elevated left ventricular end-diastolic  pressure.   2. Right ventricular systolic function is normal. The right ventricular  size is normal. There is mildly elevated pulmonary artery systolic  pressure.   3. Left atrial size was mildly dilated.   4. The mitral valve is normal in structure. Trivial mitral valve  regurgitation. No evidence of mitral stenosis.   5. The aortic valve has been repaired/replaced. Aortic valve  regurgitation is mild. No aortic stenosis is present. There is a 23 mm  Sapien prosthetic (TAVR) valve present in the aortic position. Echo  findings are consistent with normal structure and  function of the aortic valve prosthesis. Aortic valve area, by VTI  measures 1.74 cm. Aortic valve mean gradient measures 16.0 mmHg. Aortic  valve Vmax measures 2.66 m/s.   6. The inferior vena cava is normal in size with greater than 50%  respiratory variability, suggesting right atrial pressure of 3 mmHg.   ____________________  Echo 11/13/21 IMPRESSIONS  1. Left ventricular ejection fraction, by estimation, is 60 to 65%. The  left ventricle has normal function. The left ventricle has no regional  wall motion abnormalities. There is mild concentric left ventricular  hypertrophy. Left ventricular diastolic  parameters are indeterminate. Elevated left ventricular end-diastolic  pressure.   2. Right ventricular systolic function is normal. The right ventricular  size is normal. There is normal pulmonary artery systolic pressure. The  estimated right ventricular systolic pressure is 23.6 mmHg.   3. The mitral valve is normal in structure. Trivial mitral valve  regurgitation. No evidence of mitral stenosis.   4. The aortic valve has been repaired/replaced. Perivalvular Aortic valve  regurgitation is trivial. No aortic stenosis is present. Aortic valve mean  gradient measures 10.5 mmHg. Aortic valve Vmax measures 2.28 m/s. DVI  0.46.   5. The inferior vena cava is normal in size with greater than 50%  respiratory  variability, suggesting right atrial pressure of 3 mmHg.    ________________________  Echo 10/06/22 IMPRESSIONS   1. Left ventricular ejection fraction, by estimation, is 60 to 65%. Left ventricular ejection fraction by 3D volume is 61 %. The left ventricle has normal function. The left ventricle has no regional wall motion abnormalities. Left ventricular diastolic  parameters were normal.  2. Right ventricular systolic function is mildly reduced. The right ventricular size is mildly enlarged. There is normal pulmonary artery systolic pressure.  3. The mitral valve is normal in structure. Trivial mitral valve regurgitation. No evidence of mitral stenosis.  4. The aortic valve has been repaired/replaced. Aortic valve regurgitation is mild. There is a 23 mm Edwards Sapien prosthetic (TAVR) valve present in the aortic position. Procedure Date: 10/13/2021. Aortic valve mean gradient measures 13.0 mmHg, stable  from prior echo. Mild PVL. Appears 2 AI jets of mild severity, one paravalvular and one transvalvular  5. The inferior vena cava is normal in size with greater than 50% respiratory variability, suggesting right atrial pressure of 3 mmHg.    EKG:  EKG is NOT ordered today.    Recent Labs: 10/14/2021: Magnesium 1.7 03/11/2022: ALT 35 04/16/2022: Hemoglobin 11.0; Platelets 202 04/30/2022: BUN 24; Creatinine, Ser 1.13; Potassium 4.3; Sodium 145  Recent Lipid Panel    Component Value Date/Time   CHOL 96 (L) 08/25/2021 0718   TRIG 243 (H) 08/25/2021 0718   HDL 6 (L) 08/25/2021 0718   CHOLHDL 16.0 (H) 08/25/2021 0718   CHOLHDL 3.6 05/03/2016 0909   VLDL 18 05/03/2016 0909   LDLCALC  51 08/25/2021 0718     Risk Assessment/Calculations:       Physical Exam:    VS:  BP 136/68   Pulse 65   Ht 5\' 6"  (1.676 m)   Wt 156 lb 3.2 oz (70.9 kg)   SpO2 96%   BMI 25.21 kg/m     Wt Readings from Last 3 Encounters:  10/06/22 156 lb 3.2 oz (70.9 kg)  09/01/22 159 lb (72.1 kg)  05/28/22 157  lb 12.8 oz (71.6 kg)     GEN:  Well nourished, well developed in no acute distress HEENT: Normal NECK: No JVD LYMPHATICS: No lymphadenopathy CARDIAC: RRR, no murmurs, rubs, gallops RESPIRATORY:  Clear to auscultation without rales, wheezing or rhonchi  ABDOMEN: Soft, non-tender, non-distended MUSCULOSKELETAL:  No edema; No deformity  SKIN: Warm and dry.  Groin/carotid sites clear without hematoma or ecchymosis  NEUROLOGIC:  Alert and oriented x 3 PSYCHIATRIC:  Normal affect   ASSESSMENT:    1. S/P TAVR (transcatheter aortic valve replacement)   2. Chronic diastolic heart failure (HCC)   3. Essential hypertension   4. S/P CABG x 4   5. Hyperlipidemia associated with type 2 diabetes mellitus (HCC)     PLAN:    In order of problems listed above:  Severe AS s/p TAVR: echo today shows EF 65%, normally functioning TAVR with a mean gradient of 13 mm hg and mild PVL (there are 2 AI jets of mild severity, one paravalvular and one transvalvular). He has NYHA class I symptoms. Continue on a baby aspirin 81mg  daily. Continue regular follow up with Dr. Mayford Knife.   Chronic diastolic CHF: appears euvolemc. Continue lasix 20mg  daily.   HTN: BP well controlled. No changes made.   CAD s/p CABG: [facility] 07/31/21 showed a patent LIMA to LAD and vein graft to second obtuse marginal. The sequential vein graft to the right coronary artery and R PLV branch had serial high-grade stenoses but medical therapy was recommended given lack of angina. Continue aspirin, BB and high intensity statin    HLD: continue statin, Nexlizet, and fenofibrate.    Medication Adjustments/Labs and Tests Ordered: Current medicines are reviewed at length with the patient today.  Concerns regarding medicines are outlined above.  No orders of the defined types were placed in this encounter.  No orders of the defined types were placed in this encounter.   Patient Instructions  Medication Instructions:  Your physician  recommends that you continue on your current medications as directed. Please refer to the Current Medication list given to you today.  *If you need a refill on your cardiac medications before your next appointment, please call your pharmacy*   Lab Work: NONE If you have labs (blood work) drawn today and your tests are completely normal, you will receive your results only by: MyChart Message (if you have MyChart) OR A paper copy in the mail If you have any lab test that is abnormal or we need to change your treatment, we will call you to review the results.   Testing/Procedures: NONE   Follow-Up: At [facility], you and your health needs are our priority.  As part of our continuing mission to provide you with exceptional heart care, we have created designated Provider Care Teams.  These Care Teams include your primary Cardiologist (physician) and Advanced Practice Providers (APPs -  Physician Assistants and Nurse Practitioners) who all work together to provide you with the care you need, when you need it.  We recommend signing  up for the patient portal called "MyChart".  Sign up information is provided on this After Visit Summary.  MyChart is used to connect with patients for Virtual Visits (Telemedicine).  Patients are able to view lab/test results, encounter notes, upcoming appointments, etc.  Non-urgent messages can be sent to your provider as well.   To learn more about what you can do with MyChart, go to [email or web address].    Your next appointment:   1 year(s)  Provider:   Armanda Magic, MD     Signed, Cline Crock, PA-C  10/06/2022 3:32 PM     [facility]

## 2022-10-06 ENCOUNTER — Ambulatory Visit: Payer: Medicare Other | Attending: Physician Assistant | Admitting: Physician Assistant

## 2022-10-06 ENCOUNTER — Ambulatory Visit (HOSPITAL_BASED_OUTPATIENT_CLINIC_OR_DEPARTMENT_OTHER): Payer: Medicare Other

## 2022-10-06 ENCOUNTER — Other Ambulatory Visit (HOSPITAL_COMMUNITY): Payer: Medicare Other

## 2022-10-06 VITALS — BP 136/68 | HR 65 | Ht 66.0 in | Wt 156.2 lb

## 2022-10-06 DIAGNOSIS — Z952 Presence of prosthetic heart valve: Secondary | ICD-10-CM | POA: Diagnosis not present

## 2022-10-06 DIAGNOSIS — Z7984 Long term (current) use of oral hypoglycemic drugs: Secondary | ICD-10-CM

## 2022-10-06 DIAGNOSIS — I5032 Chronic diastolic (congestive) heart failure: Secondary | ICD-10-CM

## 2022-10-06 DIAGNOSIS — E785 Hyperlipidemia, unspecified: Secondary | ICD-10-CM | POA: Diagnosis not present

## 2022-10-06 DIAGNOSIS — E1169 Type 2 diabetes mellitus with other specified complication: Secondary | ICD-10-CM | POA: Diagnosis not present

## 2022-10-06 DIAGNOSIS — Z951 Presence of aortocoronary bypass graft: Secondary | ICD-10-CM | POA: Diagnosis not present

## 2022-10-06 DIAGNOSIS — I10 Essential (primary) hypertension: Secondary | ICD-10-CM

## 2022-10-06 LAB — ECHOCARDIOGRAM COMPLETE
AV Mean grad: 13 mmHg
AV Peak grad: 23.2 mmHg
Ao pk vel: 2.41 m/s
Area-P 1/2: 3.77 cm2
S' Lateral: 3.1 cm

## 2022-10-06 NOTE — Patient Instructions (Signed)
Medication Instructions:  Your physician recommends that you continue on your current medications as directed. Please refer to the Current Medication list given to you today.  *If you need a refill on your cardiac medications before your next appointment, please call your pharmacy*   Lab Work: NONE If you have labs (blood work) drawn today and your tests are completely normal, you will receive your results only by: MyChart Message (if you have MyChart) OR A paper copy in the mail If you have any lab test that is abnormal or we need to change your treatment, we will call you to review the results.   Testing/Procedures: NONE   Follow-Up: At [facility], you and your health needs are our priority.  As part of our continuing mission to provide you with exceptional heart care, we have created designated Provider Care Teams.  These Care Teams include your primary Cardiologist (physician) and Advanced Practice Providers (APPs -  Physician Assistants and Nurse Practitioners) who all work together to provide you with the care you need, when you need it.  We recommend signing up for the patient portal called "MyChart".  Sign up information is provided on this After Visit Summary.  MyChart is used to connect with patients for Virtual Visits (Telemedicine).  Patients are able to view lab/test results, encounter notes, upcoming appointments, etc.  Non-urgent messages can be sent to your provider as well.   To learn more about what you can do with MyChart, go to [email or web address].    Your next appointment:   1 year(s)  Provider:   Armanda Magic, MD

## 2022-10-07 ENCOUNTER — Telehealth: Payer: Self-pay | Admitting: Cardiology

## 2022-10-07 NOTE — Telephone Encounter (Signed)
I will forward this call to the pre op pool for review. I have reviewed the chart and looks like the pt was cleared by Jari Favre, [facility] 09/28/22. Though I will ask pre op APP to review. If so we can fax notes to requesting office.

## 2022-10-07 NOTE — Telephone Encounter (Signed)
Office called regarding pt's office visit yesterday and would like a callback about pt's clearance for upcoming surgery on 5/30. Please advise.

## 2022-10-07 NOTE — Patient Instructions (Addendum)
SURGICAL WAITING ROOM VISITATION Patients having surgery or a procedure may have no more than 2 support people in the waiting area - these visitors may rotate.    Children under the age of 46 must have an adult with them who is not the patient.  If the patient needs to stay at the hospital during part of their recovery, the visitor guidelines for inpatient rooms apply. Pre-op nurse will coordinate an appropriate time for 1 support person to accompany patient in pre-op.  This support person may not rotate.    Please refer to the [facility] website for the visitor guidelines for Inpatients (after your surgery is over and you are in a regular room).       Your procedure is scheduled on: 10-14-22   Report to [facility] Main Entrance    Report to admitting at 8:15 AM   Call this number if you have problems the morning of surgery [phone]   Do not eat food or drink liquids :After Midnight.           If you have questions, please contact your surgeon's office.    Oral Hygiene is also important to reduce your risk of infection.                                    Remember - BRUSH YOUR TEETH THE MORNING OF SURGERY WITH YOUR REGULAR TOOTHPASTE   Do NOT smoke after Midnight   Take these medicines the morning of surgery with A SIP OF WATER:   Atorvastatin  Nexlizet  Fenofibrate  Metoprolol  Singulair  Tylenol if needed  Okay to use inhalers and nasal spray   Stop your ASA 81 mg  How to Manage Your Diabetes Before and After Surgery  Why is it important to control my blood sugar before and after surgery? Improving blood sugar levels before and after surgery helps healing and can limit problems. A way of improving blood sugar control is eating a healthy diet by:  Eating less sugar and carbohydrates  Increasing activity/exercise  Talking with your doctor about reaching your blood sugar goals High blood sugars (greater than 180 mg/dL) can raise your risk of infections  and slow your recovery, so you will need to focus on controlling your diabetes during the weeks before surgery. Make sure that the doctor who takes care of your diabetes knows about your planned surgery including the date and location.  How do I manage my blood sugar before surgery? Check your blood sugar at least 4 times a day, starting 2 days before surgery, to make sure that the level is not too high or low. Check your blood sugar the morning of your surgery when you wake up and every 2 hours until you get to the Short Stay unit. If your blood sugar is less than 70 mg/dL, you will need to treat for low blood sugar: Do not take insulin. Treat a low blood sugar (less than 70 mg/dL) with  cup of clear juice (cranberry or apple), 4 glucose tablets, OR glucose gel. Recheck blood sugar in 15 minutes after treatment (to make sure it is greater than 70 mg/dL). If your blood sugar is not greater than 70 mg/dL on recheck, call [phone] for further instructions. Report your blood sugar to the short stay nurse when you get to Short Stay.  If you are admitted to the hospital after surgery: Your blood  sugar will be checked by the staff and you will probably be given insulin after surgery (instead of oral diabetes medicines) to make sure you have good blood sugar levels. The goal for blood sugar control after surgery is 80-180 mg/dL.   WHAT DO I DO ABOUT MY DIABETES MEDICATION?  Do not take oral diabetes medicines (pills) the morning of surgery.  (Metformin)   Bring CPAP mask and tubing day of surgery.                              You may not have any metal on your body including jewelry, and body piercing             Do not wear lotions, powders, cologne, or deodorant              Men may shave face and neck.   Do not bring valuables to the hospital. [facility] IS NOT RESPONSIBLE   FOR VALUABLES.   Contacts, dentures or bridgework may not be worn into surgery.  DO NOT BRING YOUR HOME  MEDICATIONS TO THE HOSPITAL. PHARMACY WILL DISPENSE MEDICATIONS LISTED ON YOUR MEDICATION LIST TO YOU DURING YOUR ADMISSION IN THE HOSPITAL!    Patients discharged on the day of surgery will not be allowed to drive home.  Someone NEEDS to stay with you for the first 24 hours after anesthesia.   Special Instructions: Bring a copy of your healthcare power of attorney and living will documents the day of surgery if you haven't scanned them before.              Please read over the following fact sheets you were given: IF YOU HAVE QUESTIONS ABOUT YOUR PRE-OP INSTRUCTIONS PLEASE CALL [phone] Gwen  If you received a COVID test during your pre-op visit  it is requested that you wear a mask when out in public, stay away from anyone that may not be feeling well and notify your surgeon if you develop symptoms. If you test positive for Covid or have been in contact with anyone that has tested positive in the last 10 days please notify you surgeon.  [facility] - Preparing for Surgery Before surgery, you can play an important role.  Because skin is not sterile, your skin needs to be as free of germs as possible.  You can reduce the number of germs on your skin by washing with CHG (chlorahexidine gluconate) soap before surgery.  CHG is an antiseptic cleaner which kills germs and bonds with the skin to continue killing germs even after washing. Please DO NOT use if you have an allergy to CHG or antibacterial soaps.  If your skin becomes reddened/irritated stop using the CHG and inform your nurse when you arrive at Short Stay..  You may shave your face/neck.  Please follow these instructions carefully:  1.  Shower with CHG Soap the night before surgery and the  morning of surgery.  2.  If you choose to wash your hair, wash your hair first as usual with your normal  shampoo.  3.  After you shampoo, rinse your hair and body thoroughly to remove the shampoo.                             4.  Use CHG as you would  any other liquid soap.  You can apply chg directly to the skin and wash.  Gently with a scrungie or clean washcloth.  5.  Apply the CHG Soap to your body ONLY FROM THE NECK DOWN.   Do not use on face/ open                           Wound or open sores. Avoid contact with eyes, ears mouth and genitals (private parts).                       Wash face,  Genitals (private parts) with your normal soap.             6.  Wash thoroughly, paying special attention to the area where your  surgery  will be performed.  7.  Thoroughly rinse your body with warm water from the neck down.  8.  DO NOT shower/wash with your normal soap after using and rinsing off the CHG Soap.             9.  Pat yourself dry with a clean towel.            10.  Wear clean pajamas.            11.  Place clean sheets on your bed the night of your first shower and do not  sleep with pets. Day of Surgery : Do not apply any lotions/deodorants the morning of surgery.  Please wear clean clothes to the hospital/surgery center.  FAILURE TO FOLLOW THESE INSTRUCTIONS MAY RESULT IN THE CANCELLATION OF YOUR SURGERY  PATIENT SIGNATURE_________________________________   ________________________________________________________________________

## 2022-10-08 ENCOUNTER — Encounter (HOSPITAL_COMMUNITY)
Admission: RE | Admit: 2022-10-08 | Discharge: 2022-10-08 | Disposition: A | Discharge status: Home / Self Care | Payer: Medicare Other | Source: Ambulatory Visit | Attending: Urology | Admitting: Urology

## 2022-10-08 ENCOUNTER — Other Ambulatory Visit: Payer: Self-pay

## 2022-10-08 ENCOUNTER — Encounter (HOSPITAL_COMMUNITY): Payer: Self-pay

## 2022-10-08 VITALS — BP 124/66 | HR 70 | Temp 98.2°F | Resp 18 | Ht 66.0 in | Wt 154.0 lb

## 2022-10-08 DIAGNOSIS — I5032 Chronic diastolic (congestive) heart failure: Secondary | ICD-10-CM | POA: Insufficient documentation

## 2022-10-08 DIAGNOSIS — E1136 Type 2 diabetes mellitus with diabetic cataract: Secondary | ICD-10-CM | POA: Diagnosis not present

## 2022-10-08 DIAGNOSIS — I2510 Atherosclerotic heart disease of native coronary artery without angina pectoris: Secondary | ICD-10-CM

## 2022-10-08 DIAGNOSIS — E119 Type 2 diabetes mellitus without complications: Secondary | ICD-10-CM | POA: Diagnosis not present

## 2022-10-08 DIAGNOSIS — N135 Crossing vessel and stricture of ureter without hydronephrosis: Secondary | ICD-10-CM | POA: Insufficient documentation

## 2022-10-08 DIAGNOSIS — Z01812 Encounter for preprocedural laboratory examination: Secondary | ICD-10-CM | POA: Diagnosis not present

## 2022-10-08 DIAGNOSIS — Z951 Presence of aortocoronary bypass graft: Secondary | ICD-10-CM | POA: Diagnosis not present

## 2022-10-08 DIAGNOSIS — J45909 Unspecified asthma, uncomplicated: Secondary | ICD-10-CM | POA: Diagnosis not present

## 2022-10-08 DIAGNOSIS — I110 Hypertensive heart disease with heart failure: Secondary | ICD-10-CM | POA: Diagnosis not present

## 2022-10-08 HISTORY — DX: Type 2 diabetes mellitus without complications: E11.9

## 2022-10-08 LAB — BASIC METABOLIC PANEL
Anion gap: 9 (ref 5–15)
BUN: 22 mg/dL (ref 8–23)
CO2: 26 mmol/L (ref 22–32)
Calcium: 9.4 mg/dL (ref 8.9–10.3)
Chloride: 103 mmol/L (ref 98–111)
Creatinine, Ser: 1.33 mg/dL — ABNORMAL HIGH (ref 0.61–1.24)
GFR, Estimated: 57 mL/min — ABNORMAL LOW (ref >60)
Glucose, Bld: 157 mg/dL — ABNORMAL HIGH (ref 70–99)
Potassium: 3.9 mmol/L (ref 3.5–5.1)
Sodium: 138 mmol/L (ref 135–145)

## 2022-10-08 LAB — GLUCOSE, CAPILLARY: Glucose-Capillary: 166 mg/dL — ABNORMAL HIGH (ref 70–99)

## 2022-10-08 LAB — CBC
HCT: 38.9 % — ABNORMAL LOW (ref 39.0–52.0)
Hemoglobin: 12.5 g/dL — ABNORMAL LOW (ref 13.0–17.0)
MCH: 27.7 pg (ref 26.0–34.0)
MCHC: 32.1 g/dL (ref 30.0–36.0)
MCV: 86.3 fL (ref 80.0–100.0)
Platelets: 307 10*3/uL (ref 150–400)
RBC: 4.51 MIL/uL (ref 4.22–5.81)
RDW: 14.9 % (ref 11.5–15.5)
WBC: 11 10*3/uL — ABNORMAL HIGH (ref 4.0–10.5)
nRBC: 0 % (ref 0.0–0.2)

## 2022-10-08 NOTE — Progress Notes (Signed)
Anesthesia note:     PCP - Dr. Liane Comber Cardiologist -Dr. Kym Groom clearance 09/21/22 Other-   Chest x-ray - no EKG - 10/04/22-epic Stress Test - 2020 ECHO - 10/06/22-epic Cardiac Cath - 2023 CABG-2015 Pacemaker/ICD device last checked:no  Sleep Study - no CPAP -   Just was put on metformin CBG at PAT visit-166 Fasting Blood Sugar at home-doesn't test Checks Blood Sugar _____  Blood Thinner:ASA 81 mg last dose 5/24 Blood Thinner Instructions: Aspirin Instructions: Last Dose:  Anesthesia review: Yes  reason:Cardiac  Patient denies shortness of breath, fever, cough and chest pain at PAT appointment. Pt has no SOB but he has needed his inhaler daily since December.   Patient verbalized understanding of instructions that were given to them at the PAT appointment. Patient was also instructed that they will need to review over the PAT instructions again at home before surgery.yes

## 2022-10-09 LAB — HEMOGLOBIN A1C
Hgb A1c MFr Bld: 6.7 % — ABNORMAL HIGH (ref 4.8–5.6)
Mean Plasma Glucose: 146 mg/dL

## 2022-10-12 NOTE — Progress Notes (Signed)
Anesthesia Chart Review   Case: [id number] Date/Time: 10/14/22 1015   Procedure: CYSTOSCOPY/RIGHT URETEROSCOPY WITH POSSIBLE HOLMIUM LASER/RIGHT RETROGRADE PYELOGRAPHY/RIGHT URETERAL STENT PLACEMENT (Right) - 60 MINUTES NEEDED FRO CASE   Anesthesia type: General   Pre-op diagnosis: RIGHT URETERAL OBSTRUCTION   Location: [facility] PROC[facility] / [facility]   Surgeons: Heloise Purpura, MD       DISCUSSION: 72 year old never smoker with history of CAD s/p CABG x4 (2015), s/p TAVR (10/13/2021), HTN, DM, chronic diastolic CHF, asthma, right ureteral obstruction scheduled for above procedure 10/14/2022 with Dr. Heloise Purpura.  Pt last seen by cardiology 10/06/2022. Per OV note pt euvolemic, BP well controlled, pt stable with 1 year follow up recommended.   Per cardiology preoperative evaluation note 09/28/2022, "Mr. Graff's perioperative risk of a major cardiac event is 0.9% according to the Revised Cardiac Risk Index (RCRI).  Therefore, he is at low risk for perioperative complications.   His functional capacity is good at 5.07 METs according to the Duke Activity Status Index (DASI). Recommendations: According to ACC/AHA guidelines, no further cardiovascular testing needed.  The patient may proceed to surgery at acceptable risk.   Antiplatelet and/or Anticoagulation Recommendations: Aspirin can be held for 5 days prior to his surgery.  Please resume Aspirin post operatively when it is felt to be safe from a bleeding standpoint. "  Anticipate pt can proceed with planned procedure barring acute status change.   VS: BP 124/66   Pulse 70   Temp 36.8 C (Oral)   Resp 18   Ht 5\' 6"  (1.676 m)   Wt 69.9 kg   SpO2 97%   BMI 24.86 kg/m   PROVIDERS: Johny Blamer, MD is PCP  [facility] Cardiologist:  Armanda Magic, MD  / Dr. Lynnette Caffey & Dr. Laneta Simmers (TAVR)   LABS: Labs reviewed: Acceptable for surgery. (all labs ordered are listed, but only abnormal results are displayed)  Labs Reviewed  HEMOGLOBIN A1C -  Abnormal; Notable for the following components:      Result Value   Hgb A1c MFr Bld 6.7 (*)    All other components within normal limits  BASIC METABOLIC PANEL - Abnormal; Notable for the following components:   Glucose, Bld 157 (*)    Creatinine, Ser 1.33 (*)    GFR, Estimated 57 (*)    All other components within normal limits  CBC - Abnormal; Notable for the following components:   WBC 11.0 (*)    Hemoglobin 12.5 (*)    HCT 38.9 (*)    All other components within normal limits  GLUCOSE, CAPILLARY - Abnormal; Notable for the following components:   Glucose-Capillary 166 (*)    All other components within normal limits     IMAGES:   EKG:   CV: Echo 10/07/2022 1. Left ventricular ejection fraction, by estimation, is 60 to 65%. Left  ventricular ejection fraction by 3D volume is 61 %. The left ventricle has  normal function. The left ventricle has no regional wall motion  abnormalities. Left ventricular diastolic   parameters were normal.   2. Right ventricular systolic function is mildly reduced. The right  ventricular size is mildly enlarged. There is normal pulmonary artery  systolic pressure.   3. The mitral valve is normal in structure. Trivial mitral valve  regurgitation. No evidence of mitral stenosis.   4. The aortic valve has been repaired/replaced. Aortic valve  regurgitation is mild. There is a 23 mm Edwards Sapien prosthetic (TAVR)  valve present in the aortic position. Procedure  Date: 10/13/2021. Aortic  valve mean gradient measures 13.0 mmHg, stable  from prior echo. Mild PVL. Appears 2 AI jets of mild severity, one  paravalvular and one transvalvular   5. The inferior vena cava is normal in size with greater than 50%  respiratory variability, suggesting right atrial pressure of 3 mmHg.  Past Medical History:  Diagnosis Date   Asthma    current inhaler use   CAD (coronary artery disease), native coronary artery    LIMA to LAD, SVG to OM1, SVG to RCA-RPL,  EVH via right thigh and leg   CHF (congestive heart failure) (HCC)    Colon polyp    Diabetes mellitus without complication (HCC)    Environmental allergies    History of kidney stones    Hyperlipidemia    Hypertension    Prostate cancer (HCC)    radiation seeds no CA now   S/P TAVR (transcatheter aortic valve replacement) 10/13/2021   s/p TAVR with a 23mm Edwards S3UR via the left carotid approach by Dr. Lynnette Caffey & Dr. Laneta Simmers   Seizure [facility])    due to lidocaine allergy with prostate biopsy this year   Severe aortic stenosis     Past Surgical History:  Procedure Laterality Date   CORONARY ARTERY BYPASS GRAFT N/A 02/13/2014   Procedure: CORONARY ARTERY BYPASS GRAFTING (CABG) TIMES FOUR USING LEFT INTERNAL MAMMARY ARTERY AND RIGHT SAPHENOUS LEG VEIN HARVESTED ENDOSCOPICALLY;  Surgeon: Purcell Nails, MD;  Location: [facility];  Service: Open Heart Surgery;  Laterality: N/A;   CYSTOSCOPY WITH LITHOLAPAXY N/A 03/17/2020   Procedure: CYSTOSCOPY WITH LITHOLAPAXY;  Surgeon: Heloise Purpura, MD;  Location: [facility];  Service: Urology;  Laterality: N/A;   CYSTOSCOPY/URETEROSCOPY/HOLMIUM LASER/STENT PLACEMENT Right 04/01/2022   Procedure: CYSTOSCOPY/RIGHT URETEROSCOP/RIGHT RETROGRADE PYELOGRAM;  Surgeon: Heloise Purpura, MD;  Location[facility];  Service: Urology;  Laterality: Right;  60 MINUTES NEEDED FOR CASE   CYSTOSCOPY/URETEROSCOPY/HOLMIUM LASER/STENT PLACEMENT Right 05/03/2022   Procedure: CYSTOSCOPY/RIGHT URETEROSCOPY/HOLMIUM LASER/RIGHT STENT PLACEMENT;  Surgeon: Heloise Purpura, MD;  [facility];  Service: Urology;  Laterality: Right;  90 MINUTES NEEDED FOR CASE   cytoscopy  15 yrs ago   EXTRACORPOREAL SHOCK WAVE LITHOTRIPSY  15 yrs ago   EYE SURGERY Bilateral 2015   cataracts   GOLD SEED IMPLANT N/A 03/17/2020   Procedure: GOLD SEED IMPLANT;  Surgeon: Heloise Purpura, MD;  Location: [facility];  Service: Urology;  Laterality: N/A;   INTRAOPERATIVE  TRANSESOPHAGEAL ECHOCARDIOGRAM N/A 02/13/2014   Procedure: INTRAOPERATIVE TRANSESOPHAGEAL ECHOCARDIOGRAM;  Surgeon: Purcell Nails, MD;  Location: [facility] OR;  Service: Open Heart Surgery;  Laterality: N/A;   INTRAOPERATIVE TRANSESOPHAGEAL ECHOCARDIOGRAM N/A 10/13/2021   Procedure: INTRAOPERATIVE TRANSESOPHAGEAL ECHOCARDIOGRAM;  Surgeon: Orbie Pyo, MD;  Location: [facility] OR;  Service: Open Heart Surgery;  Laterality: N/A;   IR URETERAL STENT RIGHT NEW ACCESS W/SEP NEPHROSTOMY CATH  04/16/2022   LEFT HEART CATHETERIZATION WITH CORONARY ANGIOGRAM N/A 02/12/2014   Procedure: LEFT HEART CATHETERIZATION WITH CORONARY ANGIOGRAM;  Surgeon: Micheline Chapman, MD;  Location: [facility] CATH LAB;  Service: Cardiovascular;  Laterality: N/A;   left leg surgery for fracture  2004   plates inserted   PROSTATE BIOPSY  06/2019   RIGHT/LEFT HEART CATH AND CORONARY/GRAFT ANGIOGRAPHY N/A 07/31/2021   Procedure: RIGHT/LEFT HEART CATH AND CORONARY/GRAFT ANGIOGRAPHY;  Surgeon: Orbie Pyo, MD;  Location: [facility] INVASIVE CV LAB;  Service: Cardiovascular;  Laterality: N/A;   SPACE OAR INSTILLATION N/A 03/17/2020   Procedure: SPACE OAR INSTILLATION;  Surgeon: Heloise Purpura, MD;  Location: [facility];  Service: Urology;  Laterality: N/A;   TRANSCATHETER AORTIC VALVE REPLACEMENT, CAROTID Left 10/13/2021   Procedure: Transcatheter Aortic Valve Replacement-Carotid;  Surgeon: Orbie Pyo, MD;  Location: [facility] OR;  Service: Open Heart Surgery;  Laterality: Left;   ULTRASOUND GUIDANCE FOR VASCULAR ACCESS Right 10/13/2021   Procedure: ULTRASOUND GUIDANCE FOR VASCULAR ACCESS;  Surgeon: Orbie Pyo, MD;  Location: [facility] OR;  Service: Open Heart Surgery;  Laterality: Right;    MEDICATIONS:  acetaminophen (TYLENOL) 325 MG tablet   albuterol (VENTOLIN HFA) 108 (90 Base) MCG/ACT inhaler   aspirin 81 MG chewable tablet   atorvastatin (LIPITOR) 80 MG tablet   Bempedoic Acid-Ezetimibe (NEXLIZET) 180-10 MG TABS    fenofibrate (TRICOR) 145 MG tablet   fluticasone (FLONASE) 50 MCG/ACT nasal spray   Fluticasone Furoate (ARNUITY ELLIPTA) 200 MCG/ACT AEPB   furosemide (LASIX) 20 MG tablet   levocetirizine (XYZAL) 5 MG tablet   metFORMIN (GLUCOPHAGE) 500 MG tablet   metoprolol tartrate (LOPRESSOR) 50 MG tablet   montelukast (SINGULAIR) 10 MG tablet   nitroGLYCERIN (NITROSTAT) 0.4 MG SL tablet   VASCEPA 1 g capsule   No current facility-administered medications for this encounter.    sodium phosphate (FLEET) 7-19 GM/118ML enema 1 enema     Jodell Cipro Ward, PA-C [facility] Pre-Surgical Testing [phone]

## 2022-10-12 NOTE — Anesthesia Preprocedure Evaluation (Signed)
Anesthesia Evaluation    Reviewed: Allergy & Precautions, Patient's Chart, lab work & pertinent test results, Unable to perform ROS - Chart review only  Airway Mallampati: II  TM Distance: >3 FB Neck ROM: Full    Dental  (+) Poor Dentition, Dental Advisory Given   Pulmonary asthma    Pulmonary exam normal breath sounds clear to auscultation       Cardiovascular hypertension, Pt. on home beta blockers and Pt. on medications + CAD, + CABG (2015) and +CHF  Normal cardiovascular exam+ Valvular Problems/Murmurs (s/p TAVR 09/2021) AS  Rhythm:Regular Rate:Normal  Echo 09/2022  1. Left ventricular ejection fraction, by estimation, is 60 to 65%. Left ventricular ejection fraction by 3D volume is 61 %. The left ventricle has normal function. The left ventricle has no regional wall motion abnormalities. Left ventricular diastolic   parameters were normal.   2. Right ventricular systolic function is mildly reduced. The right ventricular size is mildly enlarged. There is normal pulmonary artery systolic pressure.   3. The mitral valve is normal in structure. Trivial mitral valve regurgitation. No evidence of mitral stenosis.   4. The aortic valve has been repaired/replaced. Aortic valve regurgitation is mild. There is a 23 mm Edwards Sapien prosthetic (TAVR) valve present in the aortic position. Procedure Date: 10/13/2021. Aortic valve mean gradient measures 13.0 mmHg, stable from prior echo. Mild PVL. Appears 2 AI jets of mild severity, one paravalvular and one transvalvular   5. The inferior vena cava is normal in size with greater than 50% respiratory variability, suggesting right atrial pressure of 3 mmHg.     TTE 10/2021  1. Left ventricular ejection fraction, by estimation, is 60 to 65%. The  left ventricle has normal function. The left ventricle has no regional  wall motion abnormalities. There is mild concentric left ventricular  hypertrophy.  Left ventricular diastolic  parameters are indeterminate. Elevated left ventricular end-diastolic  pressure.   2. Right ventricular systolic function is normal. The right ventricular  size is normal. There is normal pulmonary artery systolic pressure. The  estimated right ventricular systolic pressure is 23.6 mmHg.   3. The mitral valve is normal in structure. Trivial mitral valve  regurgitation. No evidence of mitral stenosis.   4. The aortic valve has been repaired/replaced. Perivalvular Aortic valve  regurgitation is trivial. No aortic stenosis is present. Aortic valve mean  gradient measures 10.5 mmHg. Aortic valve Vmax measures 2.28 m/s. DVI  0.46.   5. The inferior vena cava is normal in size with greater than 50%  respiratory variability, suggesting right atrial pressure of 3 mmHg   Cath 2023 (prior to TAVR) 1. Patent LIMA to LAD and vein graft to second obtuse marginal. The sequential vein graft to the right coronary artery and R PLV branch has serial high-grade stenoses. Given the fact the patient has no angina should be treated with medical therapy for now. 2. Severe native multivessel disease. 3. Normal cardiac output of 8.8 L/min and index of 4.7 L/min/m with mean right atrial pressure of 6 mmHg and mean wedge pressure of 15 mmHg. 4. Capacious iliofemoral vessels bilaterally with femoral bifurcation on the left side at the mid femoral head     Neuro/Psych Seizures - (seizure with lidocaine during prostate biopsy),   negative psych ROS   GI/Hepatic negative GI ROS, Neg liver ROS,,,  Endo/Other  diabetes, Well Controlled    Renal/GU negative Renal ROS     Musculoskeletal   Abdominal   Peds  Hematology negative  hematology ROS (+)   Anesthesia Other Findings   Reproductive/Obstetrics                              Anesthesia Physical Anesthesia Plan  ASA: 3  Anesthesia Plan: General   Post-op Pain Management: Tylenol PO (pre-op)*    Induction: Intravenous  PONV Risk Score and Plan: 2 and Ondansetron, Dexamethasone and Treatment may vary due to age or medical condition  Airway Management Planned: LMA  Additional Equipment:   Intra-op Plan:   Post-operative Plan: Extubation in OR  Informed Consent: I have reviewed the patients History and Physical, chart, labs and discussed the procedure including the risks, benefits and alternatives for the proposed anesthesia with the patient or authorized representative who has indicated his/her understanding and acceptance.     Dental advisory given  Plan Discussed with: CRNA  Anesthesia Plan Comments: (See PAT note 10/08/2022)        Anesthesia Quick Evaluation

## 2022-10-13 ENCOUNTER — Ambulatory Visit: Payer: Medicare Other

## 2022-10-13 ENCOUNTER — Other Ambulatory Visit (HOSPITAL_COMMUNITY): Payer: Medicare Other

## 2022-10-13 NOTE — H&P (Signed)
Office Visit Report     09/30/2022   --------------------------------------------------------------------------------   Melvin Dunn  MRN: [id number]  DOB: Sep 16, 1950, 72 year old Male  SSN: [id number]   PRIMARY CARE:  Arvella Merles, MD  PRIMARY CARE FAX:  [phone]  REFERRING:  Azucena Kuba  PROVIDER:  Heloise Purpura, M.D.  TREATING:  Anne Fu, NP  LOCATION:  [facility] [phone]     --------------------------------------------------------------------------------   CC/HPI: 1. Locally advanced prostate cancer  2. BPH/LUTS  3. Urolithiasis/history of right ureteral calculus   09/01/2022: Melvin Dunn returns today primarily to follow-up on his right-sided hydronephrosis following right ureteral stent removal after treatment of his impacted right ureteral stone. He is at high risk for a right ureteral stricture. He follows up today with a renal ultrasound. He denies any flank pain or hematuria. He states that he continues to have urinary frequency although this is mostly in the morning. He does take furosemide in the morning and typically drinks 1 cup of coffee. His symptoms usually relent over the course of the day and into the evening. Overall, this is not particular bothersome. He is scheduled for routine follow-up of his prostate cancer in June.   09/30/2022: Here today for preoperative appointment prior to undergoing cystoscopy, right retrograde pyelography, right ureteroscopy with possible laser lithotripsy, balloon dilation of stricture, etc and right ureteral stent placement with Dr. Laverle Patter on 5/30. CT imaging ordered after last exam did confirm persistent moderate to severe right hydroureteronephrosis in the setting of possible obstructing distal right ureteral calculi, he also has high risk for ureteral stricture.   Today doing well. Past medical history remains grossly unchanged. He is now taking metformin and has had one of his GERD medications  discontinued but otherwise no other changes in medication history. Denies any interval surgical or procedural intervention. He denies any interval recurrence of unilateral pain or discomfort suggestive of obstructive uropathy. No interval stone material passage, gross hematuria or dysuria. His voiding symptoms remain grossly stable without bothersome increase in frequency/urgency or changes in force of stream from prior baseline. He had no recent fevers or chills, nausea/vomiting, chest pain or shortness of breath.     ALLERGIES: Lidocaine Promethazine    MEDICATIONS: Metoprolol Tartrate 25 mg tablet  Albuterol Sulfate  Atorvastatin Calcium 80 mg tablet  Clotrimazole 1 % cream  Fenofibrate  Garlique  Icosapent Ethyl  Lasix  Nexlizet  Nitroglycerin 0.4 mg tablet, sublingual  Xyzal     GU PSH: Cysto Bladder Stone <2.5cm - 2021 Cysto Dilate Stricture (M or F) - 2021 Cysto Remove Stent FB Sim - 05/28/2022 Cystoscopy Ureteroscopy - 04/01/2022 Locm 300-399Mg /Ml Iodine,1Ml - 2021 PLACE RT DEVICE/MARKER, PROS - 2021 Prostate Needle Biopsy - 2021, 2019, 2012 Transperineal Plmt Biodegradable Matrl 1/Mlt Njx - 2021 Ureteroscopic laser litho, Right - 05/03/2022       PSH Notes: CABG (CABG), Biopsy Of The Prostate Needle, Leg Repair   Heart Value replacement Jue 2023   NON-GU PSH: CABG (coronary artery bypass grafting) - 2016 Surgical Pathology, Gross And Microscopic Examination For Prostate Needle - 2021, 2019     GU PMH: Hydronephrosis - 09/17/2022, - 09/01/2022 BPH w/LUTS - 09/01/2022, - 05/28/2022, - 10/21/2021, - 04/14/2021, - 2022, - 2021, - 2021 Incomplete bladder emptying - 09/01/2022, - 05/28/2022, - 10/21/2021, - 04/14/2021, - 2021 Prostate Cancer - 05/28/2022, - 10/21/2021, - 04/14/2021, - 2022, - 2021, - 2021, - 2021, - 2021, - 2021, - 2021, Although  his prostate was free of any new nodularity or induration his PSA has increased to 10.6 which is higher than it has ever been in the past.  We therefore discussed further evaluation with a prostate MRI. I did discuss fusion biopsy as an option depending on the results of his MRI scan and told him I would contact him with the results of the study and my recommendations., - 2021 (Stable), His prostate remains benign. His current PSA is 6.75 which is down from where was 6 months ago. His PSA has varied over time. At this point I have recommended continued active surveillance with repeat DRE and PSA again in 6 months., - 2020 (Stable), His DRE reveals no abnormality. His PSA is 7.67. I will continue active surveillance with repeat DRE and PSA again in 6 months., - 2020 (Stable), He has elected to proceed with continued active surveillance and will return in 6 months for repeat DRE and PSA., - 2019 (Stable), I have discussed with the patient the possibility of blood per rectum, per urethra and in the ejaculate. He was counseled to contact me if he has any difficulties following his prostate biopsy whatsoever., - 2019 (Stable), His prostate is smooth and benign. His PSA is 5.53. We discussed the fact that it has been 7 years since his prostate biopsy and his PSA has remained stable but he has not undergone a repeat biopsy. We discussed the options and he has elected to proceed with an MRI scan understanding that if potentially high-grade lesions are found this would allow for fusion biopsy to target the lesions more accurately., - 2019 (Stable), His prostate remains smooth. His PSA has gone up slightly. We discussed the fact that it has been higher in the past and I told him that if he should show a continued trend upward I would recommend a repeat biopsy. I will otherwise plan to see him back in 6 months for another DRE and PSA at that time., - 2018 Ureteral calculus - 05/28/2022, - 04/22/2022, - 03/18/2022 Bulbar urethral stricture - 10/21/2021, - 2022, - 2021, - 2021 Nocturia - 2022 Urinary Retention - 2021, - 2021 Bladder Stone - 2021, - 2021 BPH w/o  LUTS (Stable), His prostate is enlarged to exam but is smooth and he has no significant voiding symptoms at this time. - 2018 ED due to arterial insufficiency, He wanted to consider sildenafil but will contact me if he would like to proceed with that. - 2017 Renal calculus, Renal calculus, bilateral - 2017 Renal cyst, Bilateral renal cysts - 2017 Urethral Stricture, Unspec, Urethral stricture - 2017      PMH Notes:   Mr. Prigge was followed by Dr. Vernie Ammons until his retirement. I assumed his care in 2021.   1) Prostate cancer: He is s/p treatment for clinical stage T3a N0 M0, Gleason 4+3=7 adenocarcinoma with EBRT and long term ADT under the care of Dr. Kathrynn Running completed in January 2022.   Pretreatment PSA: 10.6  PSA nadir:   2) Nephrolithiasis: He was found to have bilateral nonobstructing renal calculi (2 in the right kidney and 4 in the left kidney) by CT scan in 3/17.   Nov 2023: Presented with a 6 mm right mid ureteral stone, attempted ureteroscopic treatment demonstrated an impacted stone and was unable to be treated  Dec 2023: Right nephrostomy tube placement and antegrade ureteral stent placement  Dec 2023: Right ureteroscopic laser lithotripsy (predominantly calcium oxalate monohydrate)   3) Microscopic hematuria: He was found to have  3-10 rbc's/hpf on UA in 2/17. He underwent evaluation with cystoscopy and CT scan. His CT scan revealed bilateral renal calculi and bilateral renal cysts but no worrisome lesions. Cystoscopically I found a mild bulbar urethral stricture that was dilated with the cystoscope.   4) Bulbar urethral stricture. He was found to have a mild bulbar urethral stricture, cystoscopy for microscopic hematuria in 3/17. This was dilated with the cystoscope and no further therapy was necessary.   Nov 2021: Balloon dilation of urethral stricture prior to radiation   5) Bladder calculi: He is s/p cystolithalopaxy in November 2021 prior to radiation therapy.     NON-GU  PMH: Asthma, Asthma - 2014 Personal history of other diseases of the respiratory system, History of allergic rhinitis - 2014 Personal history of other endocrine, nutritional and metabolic disease, History of hyperlipidemia - 2014    FAMILY HISTORY: Dementia - Runs In Family Family Health Status Number - Runs In Family Father Deceased At Age86 ___ - Runs In Family   SOCIAL HISTORY: Marital Status: Married Preferred Language: English; Ethnicity: Not Hispanic Or Latino; Race: White Current Smoking Status: Patient has never smoked.  Has never drank.  Drinks 3 caffeinated drinks per day.     Notes: Drug Use, Alcohol Use, Never A Smoker, Marital History - Currently Married   REVIEW OF SYSTEMS:    GU Review Male:   Patient reports frequent urination and get up at night to urinate. Patient denies hard to postpone urination, burning/ pain with urination, leakage of urine, stream starts and stops, trouble starting your streams, and have to strain to urinate .  Gastrointestinal (Upper):   Patient denies nausea and vomiting.  Gastrointestinal (Lower):   Patient denies diarrhea and constipation.  Constitutional:   Patient denies fever, night sweats, weight loss, and fatigue.  Skin:   Patient denies skin rash/ lesion and itching.  Eyes:   Patient denies blurred vision and double vision.  Ears/ Nose/ Throat:   Patient denies sore throat and sinus problems.  Hematologic/Lymphatic:   Patient denies swollen glands and easy bruising.  Cardiovascular:   Patient denies leg swelling and chest pains.  Respiratory:   Patient denies cough and shortness of breath.  Endocrine:   Patient denies excessive thirst.  Musculoskeletal:   Patient denies back pain and joint pain.  Neurological:   Patient denies headaches and dizziness.  Psychologic:   Patient denies depression and anxiety.   Notes: Updated from previous visit 03/18/2022 with review from patient as noted above.   VITAL SIGNS:      09/30/2022 03:57 PM   Weight 180 lb / 81.65 kg  Height 68 in / 172.72 cm  BP 153/77 mmHg  Pulse 62 /min  Temperature 98.3 F / 36.8 C  BMI 27.4 kg/m   MULTI-SYSTEM PHYSICAL EXAMINATION:    Constitutional: Well-nourished. No physical deformities. Normally developed. Good grooming.  Neck: Neck symmetrical, not swollen. Normal tracheal position.  Respiratory: No labored breathing, no use of accessory muscles.   Cardiovascular: Normal temperature, normal extremity pulses, no swelling, no varicosities.  Skin: No paleness, no jaundice, no cyanosis. No lesion, no ulcer, no rash.  Neurologic / Psychiatric: Oriented to time, oriented to place, oriented to person. No depression, no anxiety, no agitation.  Gastrointestinal: No mass, no tenderness, no rigidity, non obese abdomen. No CVA tenderness.  Musculoskeletal: Normal gait and station of head and neck.     Complexity of Data:  Source Of History:  Patient, Medical Record Summary  Lab Test Review:  CMP  Records Review:   Previous Doctor Records, Previous Hospital Records, Previous Patient Records  Urine Test Review:   Urinalysis, Urine Culture  X-Ray Review: protocol/CT Urograms: Reviewed Films. Reviewed Report.  Lasix Renogram: Reviewed Report.     05/21/22 10/09/21 04/07/21 07/30/20 06/21/19 12/27/18 07/04/18 10/27/17  PSA  Total PSA <0.015 ng/mL <0.02 ng/mL <0.015 ng/mL 0.018 ng/mL 10.60 ng/mL 6.75 ng/mL 7.67 ng/mL 5.53 ng/mL    10/09/21 04/07/21 07/30/20  Hormones  Testosterone, Total <10 ng/dL <16 ng/dL <10 ng/dL    96/04/54 09/81/19  General Chemistry  Sodium  142 mEq/L  Potassium  4.0 mEq/L  BUN  23 mg/dL  Creatinine  1.0 mg/dL  Chloride  147 mEq/L  CO2  30 mEq/L  Glucose  159 mg/dL  Calcium  9.4 mg/dL  Protein, Total  6.6 g/dL  Albumin  3.6 g/dL  Alkaline Phosphatase  67 IU/L  eGFR African American  86.8   eGFR Non-Afr. American  74.9   BUN/Creatinine Ratio  23.0 Ratio  Urinalysis  Urine Appearance Clear    Urine Color Yellow     Urine Glucose Neg mg/dL   Urine Bilirubin Neg mg/dL   Urine Ketones Neg mg/dL   Urine Specific Gravity 1.020    Urine Blood Neg ery/uL   Urine pH 5.5    Urine Protein Neg mg/dL   Urine Urobilinogen 0.2 mg/dL   Urine Nitrites Neg    Urine Leukocyte Esterase Neg leu/uL    PROCEDURES:          Visit Complexity - G2211          Urinalysis - 81003 Dipstick Dipstick Cont'd  Color: Yellow Bilirubin: Neg  Appearance: Clear Ketones: Neg  Specific Gravity: 1.020 Blood: Neg  pH: 5.5 Protein: Neg  Glucose: Neg Urobilinogen: 0.2    Nitrites: Neg    Leukocyte Esterase: Neg    ASSESSMENT:      ICD-10 Details  1 GU:   Hydronephrosis - N13.0 Right, Chronic, Threat to Bodily Function  2   Ureteral calculus - N20.1 Right, Acute, Complicated Injury  3 NON-GU:   Encounter for other preprocedural examination - Z01.818 Undiagnosed New Problem   PLAN:            Medications Stop Meds: Aspirin 325 mg tablet  Discontinue: 09/30/2022  - Reason: change in dose and updated           Orders Labs Urine Culture          Schedule Return Visit/Planned Activity: Keep Scheduled Appointment - Follow up MD, Schedule Surgery          Document Letter(s):  Created for Patient: Clinical Summary         Notes:   All questions answered to the best my ability with understanding expressed by the patient regarding the upcoming procedure and expected postoperative course. Precautionary urine culture sent today to serve as a baseline. He will proceed with previously scheduled cystoscopy, right retrograde pyelography, right ureteroscopy with possible laser lithotripsy, balloon dilation of stricture, etc and right ureteral stent placement on 5/30 with Dr Laverle Patter.        Next Appointment:      Next Appointment: 10/14/2022 10:30 AM    Appointment Type: Surgery     Location: [facility] [phone]    Provider: Heloise Purpura, M.D.    Reason for Visit: WL/OP CYSTO, (R) RPG, (R) URS, POSS  HLL, (R) STENT      * Signed by Anne Fu, NP  on 09/30/22 at 4:08 PM (EDT)*

## 2022-10-14 ENCOUNTER — Ambulatory Visit (HOSPITAL_COMMUNITY): Payer: Medicare Other | Admitting: Physician Assistant

## 2022-10-14 ENCOUNTER — Encounter (HOSPITAL_COMMUNITY): Payer: Self-pay | Admitting: Urology

## 2022-10-14 ENCOUNTER — Ambulatory Visit (HOSPITAL_COMMUNITY)
Admission: RE | Admit: 2022-10-14 | Discharge: 2022-10-14 | Disposition: A | Discharge status: Home / Self Care | Payer: Medicare Other | Attending: Urology | Admitting: Urology

## 2022-10-14 ENCOUNTER — Encounter (HOSPITAL_COMMUNITY)
Admission: RE | Disposition: A | Discharge status: Home / Self Care | Payer: Self-pay | Source: Home / Self Care | Attending: Urology

## 2022-10-14 ENCOUNTER — Ambulatory Visit (HOSPITAL_COMMUNITY): Payer: Medicare Other

## 2022-10-14 ENCOUNTER — Ambulatory Visit (HOSPITAL_BASED_OUTPATIENT_CLINIC_OR_DEPARTMENT_OTHER): Payer: Medicare Other | Admitting: Certified Registered"

## 2022-10-14 DIAGNOSIS — N201 Calculus of ureter: Secondary | ICD-10-CM | POA: Insufficient documentation

## 2022-10-14 DIAGNOSIS — N401 Enlarged prostate with lower urinary tract symptoms: Secondary | ICD-10-CM | POA: Diagnosis not present

## 2022-10-14 DIAGNOSIS — E119 Type 2 diabetes mellitus without complications: Secondary | ICD-10-CM

## 2022-10-14 DIAGNOSIS — J45909 Unspecified asthma, uncomplicated: Secondary | ICD-10-CM

## 2022-10-14 DIAGNOSIS — I509 Heart failure, unspecified: Secondary | ICD-10-CM

## 2022-10-14 DIAGNOSIS — I2510 Atherosclerotic heart disease of native coronary artery without angina pectoris: Secondary | ICD-10-CM | POA: Diagnosis not present

## 2022-10-14 DIAGNOSIS — R3129 Other microscopic hematuria: Secondary | ICD-10-CM | POA: Insufficient documentation

## 2022-10-14 DIAGNOSIS — C61 Malignant neoplasm of prostate: Secondary | ICD-10-CM | POA: Diagnosis not present

## 2022-10-14 DIAGNOSIS — I110 Hypertensive heart disease with heart failure: Secondary | ICD-10-CM

## 2022-10-14 DIAGNOSIS — N135 Crossing vessel and stricture of ureter without hydronephrosis: Secondary | ICD-10-CM | POA: Diagnosis not present

## 2022-10-14 DIAGNOSIS — Z951 Presence of aortocoronary bypass graft: Secondary | ICD-10-CM | POA: Diagnosis not present

## 2022-10-14 HISTORY — PX: CYSTOSCOPY/URETEROSCOPY/HOLMIUM LASER/STENT PLACEMENT: SHX6546

## 2022-10-14 LAB — GLUCOSE, CAPILLARY
Glucose-Capillary: 130 mg/dL — ABNORMAL HIGH (ref 70–99)
Glucose-Capillary: 135 mg/dL — ABNORMAL HIGH (ref 70–99)

## 2022-10-14 SURGERY — CYSTOSCOPY/URETEROSCOPY/HOLMIUM LASER/STENT PLACEMENT
Anesthesia: General | Site: Ureter | Laterality: Right

## 2022-10-14 MED ORDER — IOHEXOL 300 MG/ML  SOLN
INTRAMUSCULAR | Status: DC | PRN
  Administered 2022-10-14: 23 mL

## 2022-10-14 MED ORDER — FENTANYL CITRATE (PF) 250 MCG/5ML IJ SOLN
INTRAMUSCULAR | Status: DC | PRN
  Administered 2022-10-14 (×4): 25 ug via INTRAVENOUS

## 2022-10-14 MED ORDER — FENTANYL CITRATE (PF) 100 MCG/2ML IJ SOLN
INTRAMUSCULAR | Status: AC
  Filled 2022-10-14: qty 2

## 2022-10-14 MED ORDER — DEXAMETHASONE SODIUM PHOSPHATE 10 MG/ML IJ SOLN
INTRAMUSCULAR | Status: DC | PRN
  Administered 2022-10-14: 4 mg via INTRAVENOUS

## 2022-10-14 MED ORDER — SODIUM CHLORIDE 0.9 % IR SOLN
Status: DC | PRN
  Administered 2022-10-14: 3000 mL via INTRAVESICAL

## 2022-10-14 MED ORDER — TRAMADOL HCL 50 MG PO TABS: 50.0000 mg | ORAL_TABLET | Freq: Four times a day (QID) | ORAL | 0 refills | Status: DC | PRN

## 2022-10-14 MED ORDER — DEXAMETHASONE SODIUM PHOSPHATE 10 MG/ML IJ SOLN
INTRAMUSCULAR | Status: AC
  Filled 2022-10-14: qty 1

## 2022-10-14 MED ORDER — CHLORHEXIDINE GLUCONATE 0.12 % MT SOLN
15.0000 mL | Freq: Once | OROMUCOSAL | Status: AC
  Administered 2022-10-14: 15 mL via OROMUCOSAL

## 2022-10-14 MED ORDER — PROPOFOL 10 MG/ML IV BOLUS
INTRAVENOUS | Status: DC | PRN
  Administered 2022-10-14: 180 mg via INTRAVENOUS

## 2022-10-14 MED ORDER — CEFAZOLIN SODIUM-DEXTROSE 2-4 GM/100ML-% IV SOLN
2.0000 g | INTRAVENOUS | Status: AC
  Administered 2022-10-14: 2 g via INTRAVENOUS
  Filled 2022-10-14: qty 100

## 2022-10-14 MED ORDER — PROPOFOL 10 MG/ML IV BOLUS
INTRAVENOUS | Status: AC
  Filled 2022-10-14: qty 20

## 2022-10-14 MED ORDER — LACTATED RINGERS IV SOLN: INTRAVENOUS | Status: DC

## 2022-10-14 MED ORDER — ONDANSETRON HCL 4 MG/2ML IJ SOLN
INTRAMUSCULAR | Status: AC
  Filled 2022-10-14: qty 2

## 2022-10-14 MED ORDER — PHENYLEPHRINE 80 MCG/ML (10ML) SYRINGE FOR IV PUSH (FOR BLOOD PRESSURE SUPPORT)
PREFILLED_SYRINGE | INTRAVENOUS | Status: DC | PRN
  Administered 2022-10-14: 80 ug via INTRAVENOUS
  Administered 2022-10-14: 800 ug via INTRAVENOUS

## 2022-10-14 MED ORDER — ORAL CARE MOUTH RINSE: 15.0000 mL | Freq: Once | OROMUCOSAL | Status: AC

## 2022-10-14 MED ORDER — ONDANSETRON HCL 4 MG/2ML IJ SOLN
INTRAMUSCULAR | Status: DC | PRN
  Administered 2022-10-14: 4 mg via INTRAVENOUS

## 2022-10-14 SURGICAL SUPPLY — 24 items
BAG COUNTER SPONGE SURGICOUNT (BAG) IMPLANT
BAG SPNG CNTER NS LX DISP (BAG)
BAG URO CATCHER STRL LF (MISCELLANEOUS) ×2 IMPLANT
BASKET ZERO TIP NITINOL 2.4FR (BASKET) IMPLANT
BSKT STON RTRVL ZERO TP 2.4FR (BASKET) ×1
CATH URETL OPEN END 6FR 70 (CATHETERS) IMPLANT
CLOTH BEACON ORANGE TIMEOUT ST (SAFETY) ×2 IMPLANT
GLOVE SURG LX STRL 7.5 STRW (GLOVE) ×2 IMPLANT
GOWN STRL REUS W/ TWL XL LVL3 (GOWN DISPOSABLE) ×2 IMPLANT
GOWN STRL REUS W/TWL XL LVL3 (GOWN DISPOSABLE) ×1
GUIDEWIRE STR DUAL SENSOR (WIRE) ×2 IMPLANT
GUIDEWIRE ZIPWRE .038 STRAIGHT (WIRE) IMPLANT
IV NS 1000ML (IV SOLUTION) ×1
IV NS 1000ML BAXH (IV SOLUTION) ×2 IMPLANT
KIT TURNOVER KIT A (KITS) IMPLANT
LASER FIB FLEXIVA PULSE ID 365 (Laser) IMPLANT
MANIFOLD NEPTUNE II (INSTRUMENTS) ×2 IMPLANT
PACK CYSTO (CUSTOM PROCEDURE TRAY) ×2 IMPLANT
SHEATH NAVIGATOR HD 12/14X36 (SHEATH) IMPLANT
STENT CONTOUR NO GW 8FR 26CM (STENTS) IMPLANT
TRACTIP FLEXIVA PULS ID 200XHI (Laser) IMPLANT
TRACTIP FLEXIVA PULSE ID 200 (Laser) ×1
TUBING CONNECTING 10 (TUBING) ×2 IMPLANT
TUBING UROLOGY SET (TUBING) ×2 IMPLANT

## 2022-10-14 NOTE — Interval H&P Note (Signed)
History and Physical Interval Note:  10/14/2022 8:29 AM  Melvin Dunn.  has presented today for surgery, with the diagnosis of RIGHT URETERAL OBSTRUCTION.  The various methods of treatment have been discussed with the patient and family. After consideration of risks, benefits and other options for treatment, the patient has consented to  Procedure(s) with comments: CYSTOSCOPY/RIGHT URETEROSCOPY WITH POSSIBLE HOLMIUM LASER/RIGHT RETROGRADE PYELOGRAPHY/RIGHT URETERAL STENT PLACEMENT (Right) - 60 MINUTES NEEDED FRO CASE as a surgical intervention.  The patient's history has been reviewed, patient examined, no change in status, stable for surgery.  I have reviewed the patient's chart and labs.  Questions were answered to the patient's satisfaction.     [employer]

## 2022-10-14 NOTE — Transfer of Care (Signed)
Immediate Anesthesia Transfer of Care Note  Patient: Man Mom.  Procedure(s) Performed: CYSTOSCOPY/RIGHT URETEROSCOPY WITH  HOLMIUM LASER/RIGHT RETROGRADE PYELOGRAPHY/RIGHT URETERAL STENT PLACEMENT (Right: Ureter)  Patient Location: PACU  Anesthesia Type:General  Level of Consciousness: awake and patient cooperative  Airway & Oxygen Therapy: Patient Spontanous Breathing and Patient connected to face mask oxygen  Post-op Assessment: Report given to RN and Post -op Vital signs reviewed and stable  Post vital signs: Reviewed and stable  Last Vitals:  Vitals Value Taken Time  BP 137/64 10/14/22 1120  Temp 37.1 C 10/14/22 1120  Pulse 54 10/14/22 1122  Resp 14 10/14/22 1122  SpO2 100 % 10/14/22 1122  Vitals shown include unvalidated device data.  Last Pain:  Vitals:   10/14/22 1120  TempSrc:   PainSc: 0-No pain         Complications: No notable events documented.

## 2022-10-14 NOTE — Op Note (Signed)
Preoperative diagnosis: Impacted right ureteral stone with right ureteral obstruction  Postoperative diagnosis: Impacted right ureteral stone with right ureteral obstruction  Procedures: 1.  Cystoscopy 2.  Right retrograde pyelography with interpretation 3.  Right ureteroscopy with laser lithotripsy and stone removal 4.  Right ureteral stent placement (8 x 26-no string)  Surgeon: Moody Bruins MD  Anesthesia: General  Complications: None  Specimens: Right ureteral calculi  Disposition of specimens: [facility]  EBL: Minimal  Intraoperative findings: Right retrograde pyelography was performed with a 6 French ureteral catheter and Omnipaque contrast.  This revealed a complete obstruction of the mid ureter initially.  Eventually, access was able to be obtained proximal to the obstructed area which revealed a dilated proximal ureter and renal collecting system but without filling defects.  There was an approximately 2 cm section of the ureter that appeared to be narrow between the normal distal portion of the ureter and the normal proximal portion of the ureter.  This was located in the mid ureteral section overlying the pelvis.  Indication: Melvin Dunn is a 72 year old gentleman who has a history of an impacted right ureteral stone that was diagnosed incidentally when he was asymptomatic last fall.  Retrograde access was unable to initially be performed and he required percutaneous nephrostomy placement and antegrade stent placement.  He eventually underwent retrograde ureteroscopy with laser lithotripsy of his impacted stone.  Was felt that this was completely treated.  However, follow-up imaging with a Lasix renogram indicated recurrent obstruction of the right kidney with preserved right relative renal function.  CT imaging revealed what appeared to be persistent stone material in the vicinity of the stricture although was difficult to determine if this was extra ureteral  or intrinsic.  The potential risks, complications, and the expected recovery process associate with the above procedures was explained.  He gave informed consent to proceed.  Description of procedure: The patient was taken to the operating room and a general anesthetic was administered.  He was given preoperative antibiotics, placed in the dorsolithotomy position, and prepped and draped in usual sterile fashion.  Next, a preoperative timeout was performed.  Cystourethroscopy was then performed and the bladder was examined.  There were no urethral or bladder abnormalities.  The right ureteral orifice was then identified and a 6 [place] ureteral catheter was used to intubate the ureteral orifice.  Omnipaque contrast was injected with findings as dictated above.  There did appear to be complete obstruction of the mid/distal ureter approximately 4 to 5 cm above the ureterovesical junction.  A wire was left in place up to this level and a 6 French semirigid ureteroscope was advanced next to the wire.  A lumen could be identified and the wire was able to then be manipulated up into the proximal ureter and renal collecting system.  Attempts with both a 6 [place] and a 4.5 [place] semirigid ureteroscope, while able to be advanced proximal to the obstruction, were unable to adequately visualize and treat the ureteral stone that was seen in the periphery embedded within the ureteral wall.  As such, a second wire was placed up in the renal collecting system.  A 12/14 ureteral access sheath was then advanced into the ureter below the level of obstruction.  A digital flexible ureteroscope was then advanced into the ureter and was able to be advanced proximal to the level of the obstruction over a wire.  This was then pulled back in a retrograde fashion and the stone was able  to be identified.  A 200 m holmium laser fiber was then used to fragment the visible stone material both using a dusting setting as well as a treatment  setting of 0.6 J and 6 Hz.  All visible stone material was either removed with a 0 tip nitinol basket or fragmented adequately.  Once it appeared that all stone had been treated including stone embedded within the ureteral wall, the ureteroscope and ureteral access sheath were removed.  The wire was then backloaded on the cystoscope and an 8 x 26 double-J ureteral stent was advanced over the wire using Seldinger technique.  The wire was then removed with a good curl noted in the renal pelvis as well as within the bladder.  The bladder was then emptied and the procedure was ended.  He tolerated the procedure well without complications.  He was able to be awakened and transferred to recovery unit in satisfactory condition.

## 2022-10-14 NOTE — Discharge Instructions (Addendum)

## 2022-10-14 NOTE — Anesthesia Procedure Notes (Signed)
Procedure Name: LMA Insertion Date/Time: 10/14/2022 9:49 AM  Performed by: Oletha Cruel, CRNAPre-anesthesia Checklist: Patient identified, Emergency Drugs available, Suction available, Patient being monitored and Timeout performed Patient Re-evaluated:Patient Re-evaluated prior to induction Oxygen Delivery Method: Circle system utilized Preoxygenation: Pre-oxygenation with 100% oxygen Induction Type: IV induction Ventilation: Mask ventilation without difficulty LMA: LMA inserted LMA Size: 4.0 Number of attempts: 1 Placement Confirmation: positive ETCO2 and breath sounds checked- equal and bilateral Tube secured with: Tape Dental Injury: Teeth and Oropharynx as per pre-operative assessment

## 2022-10-14 NOTE — Anesthesia Postprocedure Evaluation (Signed)
Anesthesia Post Note  Patient: Marquin Hammer.  Procedure(s) Performed: CYSTOSCOPY/RIGHT URETEROSCOPY WITH  HOLMIUM LASER/RIGHT RETROGRADE PYELOGRAPHY/RIGHT URETERAL STENT PLACEMENT (Right: Ureter)     Patient location during evaluation: PACU Anesthesia Type: General Level of consciousness: sedated and patient cooperative Pain management: pain level controlled Vital Signs Assessment: post-procedure vital signs reviewed and stable Respiratory status: spontaneous breathing Cardiovascular status: stable Anesthetic complications: no   No notable events documented.  Last Vitals:  Vitals:   10/14/22 1145 10/14/22 1155  BP: (!) 144/57 (!) 135/59  Pulse: (!) 55 (!) 53  Resp: 18   Temp: 36.6 C 36.9 C  SpO2: 93% 93%    Last Pain:  Vitals:   10/14/22 1155  TempSrc:   PainSc: 0-No pain                 Lewie Loron

## 2022-10-15 ENCOUNTER — Encounter (HOSPITAL_COMMUNITY): Payer: Self-pay | Admitting: Urology

## 2022-10-27 ENCOUNTER — Other Ambulatory Visit: Payer: Self-pay | Admitting: Physician Assistant

## 2022-11-02 DIAGNOSIS — N201 Calculus of ureter: Secondary | ICD-10-CM | POA: Diagnosis not present

## 2022-11-04 ENCOUNTER — Other Ambulatory Visit: Payer: Self-pay | Admitting: Urology

## 2022-11-04 ENCOUNTER — Telehealth: Payer: Self-pay | Admitting: Cardiology

## 2022-11-04 NOTE — Telephone Encounter (Signed)
   Pre-operative Risk Assessment    Patient Name: Melvin Dunn.  DOB: March 28, 1951 MRN: [id number]      Request for Surgical Clearance    Procedure:  CYSTOSCOPY/RIGHT RETROGRADE PYELOGRAM/RIGHT URETEROSCOPY/POSSIBLE STENT REMOVAL VERSUS REPLACEMENT   Date of Surgery:  Clearance 11/29/22                                 Surgeon:  Dr. Heloise Purpura  Surgeon's Group or Practice Name:  [facility]  Phone number:  [phone] Fax number:  [phone]    Type of Clearance Requested:   - Medical    Type of Anesthesia:  General    Additional requests/questions:    Signed, April Henson   11/04/2022, 12:59 PM

## 2022-11-04 NOTE — Telephone Encounter (Signed)
     Primary Cardiologist: Armanda Magic, MD  Chart reviewed as part of pre-operative protocol coverage. Given past medical history and time since last visit, based on ACC/AHA guidelines, Melvin Dunn. would be at acceptable risk for the planned procedure without further cardiovascular testing.   His RCRI is a class II risk, 0.9% risk of major cardiac event.  His aspirin should be continued throughout his perioperative procedure.  I will route this recommendation to the requesting party via Epic fax function and remove from pre-op pool.  Please call with questions.  Thomasene Ripple. Idell Hissong NP-C     11/04/2022, 1:56 PM [facility] [address] Office [phone] Fax [phone]

## 2022-11-11 NOTE — Patient Instructions (Signed)
DUE TO COVID-19 ONLY TWO VISITORS  (aged 73 and older)  ARE ALLOWED TO COME WITH YOU AND STAY IN THE WAITING ROOM ONLY DURING PRE OP AND PROCEDURE.   **NO VISITORS ARE ALLOWED IN THE SHORT STAY AREA OR RECOVERY ROOM!!**  IF YOU WILL BE ADMITTED INTO THE HOSPITAL YOU ARE ALLOWED ONLY FOUR SUPPORT PEOPLE DURING VISITATION HOURS ONLY (7 AM -8PM)   The support person(s) must pass our screening, gel in and out, and wear a mask at all times, including in the patient's room. Patients must also wear a mask when staff or their support person are in the room. Visitors GUEST BADGE MUST BE WORN VISIBLY  One adult visitor may remain with you overnight and MUST be in the room by 8 P.M.     Your procedure is scheduled on: 11/29/22   Report to [facility] Main Entrance    Report to admitting at : 1:15 PM   Call this number if you have problems the morning of surgery [phone]   Do not eat food :After Midnight.   After Midnight you may have the following liquids until : 12:30 PM DAY OF SURGERY  Water Black Coffee (sugar ok, NO MILK/CREAM OR CREAMERS)  Tea (sugar ok, NO MILK/CREAM OR CREAMERS) regular and decaf                             Plain Jell-O (NO RED)                                           Fruit ices (not with fruit pulp, NO RED)                                     Popsicles (NO RED)                                                                  Juice: apple, WHITE grape, WHITE cranberry Sports drinks like Gatorade (NO RED)               Oral Hygiene is also important to reduce your risk of infection.                                    Remember - BRUSH YOUR TEETH THE MORNING OF SURGERY WITH YOUR REGULAR TOOTHPASTE  DENTURES WILL BE REMOVED PRIOR TO SURGERY PLEASE DO NOT APPLY "Poly grip" OR ADHESIVES!!!   Do NOT smoke after Midnight   Take these medicines the morning of surgery with A SIP OF WATER: metoprolol,montelukast,levocetirizine,  DO NOT TAKE ANY ORAL DIABETIC  MEDICATIONS DAY OF YOUR SURGERY                              You may not have any metal on your body including hair pins, jewelry, and body piercing  Do not wear lotions, powders, perfumes/cologne, or deodorant              Men may shave face and neck.   Do not bring valuables to the hospital. [facility] IS NOT             RESPONSIBLE   FOR VALUABLES.   Contacts, glasses, or bridgework may not be worn into surgery.   Bring small overnight bag day of surgery.   DO NOT BRING YOUR HOME MEDICATIONS TO THE HOSPITAL. PHARMACY WILL DISPENSE MEDICATIONS LISTED ON YOUR MEDICATION LIST TO YOU DURING YOUR ADMISSION IN THE HOSPITAL!    Patients discharged on the day of surgery will not be allowed to drive home.  Someone NEEDS to stay with you for the first 24 hours after anesthesia.   Special Instructions: Bring a copy of your healthcare power of attorney and living will documents         the day of surgery if you haven't scanned them before.              Please read over the following fact sheets you were given: IF YOU HAVE QUESTIONS ABOUT YOUR PRE-OP INSTRUCTIONS PLEASE CALL [phone]    [facility] - Preparing for Surgery Before surgery, you can play an important role.  Because skin is not sterile, your skin needs to be as free of germs as possible.  You can reduce the number of germs on your skin by washing with CHG (chlorahexidine gluconate) soap before surgery.  CHG is an antiseptic cleaner which kills germs and bonds with the skin to continue killing germs even after washing. Please DO NOT use if you have an allergy to CHG or antibacterial soaps.  If your skin becomes reddened/irritated stop using the CHG and inform your nurse when you arrive at Short Stay. Do not shave (including legs and underarms) for at least 48 hours prior to the first CHG shower.  You may shave your face/neck. Please follow these instructions carefully:  1.  Shower with CHG Soap the night before surgery  and the  morning of Surgery.  2.  If you choose to wash your hair, wash your hair first as usual with your  normal  shampoo.  3.  After you shampoo, rinse your hair and body thoroughly to remove the  shampoo.                           4.  Use CHG as you would any other liquid soap.  You can apply chg directly  to the skin and wash                       Gently with a scrungie or clean washcloth.  5.  Apply the CHG Soap to your body ONLY FROM THE NECK DOWN.   Do not use on face/ open                           Wound or open sores. Avoid contact with eyes, ears mouth and genitals (private parts).                       Wash face,  Genitals (private parts) with your normal soap.             6.  Wash thoroughly, paying special attention to the area where your surgery  will be performed.  7.  Thoroughly rinse your body with warm water from the neck down.  8.  DO NOT shower/wash with your normal soap after using and rinsing off  the CHG Soap.                9.  Pat yourself dry with a clean towel.            10.  Wear clean pajamas.            11.  Place clean sheets on your bed the night of your first shower and do not  sleep with pets. Day of Surgery : Do not apply any lotions/deodorants the morning of surgery.  Please wear clean clothes to the hospital/surgery center.  FAILURE TO FOLLOW THESE INSTRUCTIONS MAY RESULT IN THE CANCELLATION OF YOUR SURGERY PATIENT SIGNATURE_________________________________  NURSE SIGNATURE__________________________________  ________________________________________________________________________

## 2022-11-12 ENCOUNTER — Encounter (HOSPITAL_COMMUNITY): Payer: Self-pay

## 2022-11-12 ENCOUNTER — Other Ambulatory Visit: Payer: Self-pay

## 2022-11-12 ENCOUNTER — Encounter (HOSPITAL_COMMUNITY)
Admission: RE | Admit: 2022-11-12 | Discharge: 2022-11-12 | Disposition: A | Discharge status: Home / Self Care | Payer: Medicare Other | Source: Ambulatory Visit | Attending: Urology | Admitting: Urology

## 2022-11-12 VITALS — BP 128/71 | HR 68 | Temp 98.6°F | Ht 67.0 in | Wt 152.0 lb

## 2022-11-12 DIAGNOSIS — Z01818 Encounter for other preprocedural examination: Secondary | ICD-10-CM | POA: Insufficient documentation

## 2022-11-12 DIAGNOSIS — I350 Nonrheumatic aortic (valve) stenosis: Secondary | ICD-10-CM | POA: Diagnosis not present

## 2022-11-12 DIAGNOSIS — E119 Type 2 diabetes mellitus without complications: Secondary | ICD-10-CM | POA: Insufficient documentation

## 2022-11-12 LAB — CBC
HCT: 37.7 % — ABNORMAL LOW (ref 39.0–52.0)
Hemoglobin: 12.2 g/dL — ABNORMAL LOW (ref 13.0–17.0)
MCH: 28.6 pg (ref 26.0–34.0)
MCHC: 32.4 g/dL (ref 30.0–36.0)
MCV: 88.5 fL (ref 80.0–100.0)
Platelets: 302 10*3/uL (ref 150–400)
RBC: 4.26 MIL/uL (ref 4.22–5.81)
RDW: 15.8 % — ABNORMAL HIGH (ref 11.5–15.5)
WBC: 10.7 10*3/uL — ABNORMAL HIGH (ref 4.0–10.5)
nRBC: 0 % (ref 0.0–0.2)

## 2022-11-12 LAB — BASIC METABOLIC PANEL
Anion gap: 9 (ref 5–15)
BUN: 26 mg/dL — ABNORMAL HIGH (ref 8–23)
CO2: 22 mmol/L (ref 22–32)
Calcium: 9.4 mg/dL (ref 8.9–10.3)
Chloride: 106 mmol/L (ref 98–111)
Creatinine, Ser: 1.1 mg/dL (ref 0.61–1.24)
GFR, Estimated: >60 mL/min (ref >60)
Glucose, Bld: 105 mg/dL — ABNORMAL HIGH (ref 70–99)
Potassium: 3.8 mmol/L (ref 3.5–5.1)
Sodium: 137 mmol/L (ref 135–145)

## 2022-11-12 LAB — GLUCOSE, CAPILLARY: Glucose-Capillary: 115 mg/dL — ABNORMAL HIGH (ref 70–99)

## 2022-11-12 NOTE — Progress Notes (Signed)
For Short Stay: COVID SWAB appointment date:  Bowel Prep reminder:   For Anesthesia: PCP - Dr. Johny Blamer Cardiologist - Dr. Armanda Magic: Clearance: 11/29/22.  Chest x-ray -  EKG -  Stress Test -  ECHO - 10/06/22 Cardiac Cath - 2023 Pacemaker/ICD device last checked: Pacemaker orders received: Device Rep notified:  Spinal Cord Stimulator: N/A  Sleep Study - N/A CPAP -   Fasting Blood Sugar - N/A Checks Blood Sugar __0___ times a day Date and result of last Hgb A1c- 10/08/22  Last dose of GLP1 agonist- N/A GLP1 instructions:   Last dose of SGLT-2 inhibitors- N/A SGLT-2 instructions:   Blood Thinner Instructions:  Aspirin Instructions: As per cardiologist note,it should be continue. Last Dose:  Activity level: Can go up a flight of stairs and activities of daily living without stopping and without chest pain and/or shortness of breath   Able to exercise without chest pain and/or shortness of breath  Anesthesia review: CAD,CHF,DIA,Seizure(only once during prostate biopsy 7 years ago)  Patient denies shortness of breath, fever, cough and chest pain at PAT appointment   Patient verbalized understanding of instructions that were given to them at the PAT appointment. Patient was also instructed that they will need to review over the PAT instructions again at home before surgery.

## 2022-11-24 DIAGNOSIS — K635 Polyp of colon: Secondary | ICD-10-CM | POA: Diagnosis not present

## 2022-11-24 DIAGNOSIS — D128 Benign neoplasm of rectum: Secondary | ICD-10-CM | POA: Diagnosis not present

## 2022-11-24 DIAGNOSIS — D125 Benign neoplasm of sigmoid colon: Secondary | ICD-10-CM | POA: Diagnosis not present

## 2022-11-24 DIAGNOSIS — K5730 Diverticulosis of large intestine without perforation or abscess without bleeding: Secondary | ICD-10-CM | POA: Diagnosis not present

## 2022-11-24 DIAGNOSIS — Z86010 Personal history of colonic polyps: Secondary | ICD-10-CM | POA: Diagnosis not present

## 2022-11-24 DIAGNOSIS — Z09 Encounter for follow-up examination after completed treatment for conditions other than malignant neoplasm: Secondary | ICD-10-CM | POA: Diagnosis not present

## 2022-11-26 DIAGNOSIS — D128 Benign neoplasm of rectum: Secondary | ICD-10-CM | POA: Diagnosis not present

## 2022-11-26 DIAGNOSIS — D125 Benign neoplasm of sigmoid colon: Secondary | ICD-10-CM | POA: Diagnosis not present

## 2022-11-26 DIAGNOSIS — K635 Polyp of colon: Secondary | ICD-10-CM | POA: Diagnosis not present

## 2022-11-26 NOTE — H&P (Signed)
Office Visit Report     11/02/2022   --------------------------------------------------------------------------------   Melvin Dunn  MRN: [id number]  DOB: [birth year], 72 year old Male  SSN: [id number]   PRIMARY CARE:  Melvin Merles, MD  PRIMARY CARE FAX:  [phone]  REFERRING:  Melvin Macleod, NP  PROVIDER:  Heloise Dunn, M.D.  LOCATION:  [facility] [phone]     --------------------------------------------------------------------------------   CC/HPI: 1. Locally advanced prostate cancer  2. BPH/LUTS  3. Right ureteral calculus/right ureteral obstruction   For full details, please see my prior note from 09/01/2022. [name] primarily follows up today after his most recent ureteroscopic procedure where he did have additional impacted ureteral stone in his mid right ureter that required laser lithotripsy. His stent was left indwelling and he has been tolerating this relatively well. He has minimal hematuria and discomfort from his stent.     ALLERGIES: Lidocaine Promethazine    MEDICATIONS: Metformin Hcl 500 mg tablet 1 tablet PO Daily  Metoprolol Tartrate 50 mg tablet 1 tablet PO BID  Albuterol Sulfate  Arnuity Ellipta 200 mcg blister, with inhalation device  Aspirin Ec 81 mg tablet, delayed release  Atorvastatin Calcium 80 mg tablet  Clotrimazole 1 % cream  Fenofibrate 145 mg tablet  Fenofibrate  Fluticasone Propionate 50 mcg blister, with inhalation device  Furosemide 20 mg tablet  Garlique  Icosapent Ethyl 1 gram capsule  Icosapent Ethyl  Lasix  Levocetirizine Dihydrochloride 5 mg tablet  Montelukast Sodium 10 mg tablet  Nexlizet  Nitroglycerin 0.4 mg tablet, sublingual  Xyzal     GU PSH: Cysto Bladder Stone <2.5cm - 2021 Cysto Dilate Stricture (M or F) - 2021 Cysto Remove Stent FB Sim - 05/28/2022 Cystoscopy Ureteroscopy - 04/01/2022 Locm 300-399Mg /Ml Iodine,1Ml - 2021 PLACE RT DEVICE/MARKER, PROS - 2021 Prostate Needle Biopsy  - 2021, 2019, 2012 Transperineal Plmt Biodegradable Matrl 1/Mlt Njx - 2021 Ureteroscopic laser litho, Right - 05/03/2022       PSH Notes: CABG (CABG), Biopsy Of The Prostate Needle, Leg Repair   Heart Value replacement Jue 2023   NON-GU PSH: CABG (coronary artery bypass grafting) - 2016 Surgical Pathology, Gross And Microscopic Examination For Prostate Needle - 2021, 2019 Visit Complexity (formerly GPC1X) - 09/30/2022     GU PMH: Hydronephrosis - 09/30/2022, - 09/17/2022, - 09/01/2022 Ureteral calculus - 09/30/2022, - 05/28/2022, - 04/22/2022, - 03/18/2022 BPH w/LUTS - 09/01/2022, - 05/28/2022, - 10/21/2021, - 04/14/2021, - 2022, - 2021, - 2021 Incomplete bladder emptying - 09/01/2022, - 05/28/2022, - 10/21/2021, - 04/14/2021, - 2021 Prostate Cancer - 05/28/2022, - 10/21/2021, - 04/14/2021, - 2022, - 2021, - 2021, - 2021, - 2021, - 2021, - 2021, Although his prostate was free of any new nodularity or induration his PSA has increased to 10.6 which is higher than it has ever been in the past. We therefore discussed further evaluation with a prostate MRI. I did discuss fusion biopsy as an option depending on the results of his MRI scan and told him I would contact him with the results of the study and my recommendations., - 2021 (Stable), His prostate remains benign. His current PSA is 6.75 which is down from where was 6 months ago. His PSA has varied over time. At this point I have recommended continued active surveillance with repeat DRE and PSA again in 6 months., - 2020 (Stable), His DRE reveals no abnormality. His PSA is 7.67. I will continue active surveillance with repeat DRE and PSA again  in 6 months., - 2020 (Stable), He has elected to proceed with continued active surveillance and will return in 6 months for repeat DRE and PSA., - 2019 (Stable), I have discussed with the patient the possibility of blood per rectum, per urethra and in the ejaculate. He was counseled to contact me if he has any difficulties  following his prostate biopsy whatsoever., - 2019 (Stable), His prostate is smooth and benign. His PSA is 5.53. We discussed the fact that it has been 7 years since his prostate biopsy and his PSA has remained stable but he has not undergone a repeat biopsy. We discussed the options and he has elected to proceed with an MRI scan understanding that if potentially high-grade lesions are found this would allow for fusion biopsy to target the lesions more accurately., - 2019 (Stable), His prostate remains smooth. His PSA has gone up slightly. We discussed the fact that it has been higher in the past and I told him that if he should show a continued trend upward I would recommend a repeat biopsy. I will otherwise plan to see him back in 6 months for another DRE and PSA at that time., - 2018 Bulbar urethral stricture - 10/21/2021, - 2022, - 2021, - 2021 Nocturia - 2022 Urinary Retention - 2021, - 2021 Bladder Stone - 2021, - 2021 BPH w/o LUTS (Stable), His prostate is enlarged to exam but is smooth and he has no significant voiding symptoms at this time. - 2018 ED due to arterial insufficiency, He wanted to consider sildenafil but will contact me if he would like to proceed with that. - 2017 Renal calculus, Renal calculus, bilateral - 2017 Renal cyst, Bilateral renal cysts - 2017 Urethral Stricture, Unspec, Urethral stricture - 2017      PMH Notes:   [name] was followed by Dr. Vernie Dunn until his retirement. I assumed his care in 2021.   1) Prostate cancer: He is s/p treatment for clinical stage T3a N0 M0, Gleason 4+3=7 adenocarcinoma with EBRT and long term ADT under the care of Dr. Kathrynn Dunn completed in January 2022.   Pretreatment PSA: 10.6  PSA nadir:   2) Nephrolithiasis: He was found to have bilateral nonobstructing renal calculi (2 in the right kidney and 4 in the left kidney) by CT scan in 3/17.   Nov 2023: Presented with a 6 mm right mid ureteral stone, attempted ureteroscopic treatment  demonstrated an impacted stone and was unable to be treated  Dec 2023: Right nephrostomy tube placement and antegrade ureteral stent placement  Dec 2023: Right ureteroscopic laser lithotripsy (predominantly calcium oxalate monohydrate)  Apr 2024: Lasix renal scan - right ureteral obstruction, 47% right relative renal function  May 2024: CT scan - persistent impacted stone material in vicinity of obstruction  May 2024: Right ureteroscopic laser lithotripsy and stone removal and right ureteral 8 Fr stent   3) Microscopic hematuria: He was found to have 3-10 rbc's/hpf on UA in 2/17. He underwent evaluation with cystoscopy and CT scan. His CT scan revealed bilateral renal calculi and bilateral renal cysts but no worrisome lesions. Cystoscopically I found a mild bulbar urethral stricture that was dilated with the cystoscope.   4) Bulbar urethral stricture. He was found to have a mild bulbar urethral stricture, cystoscopy for microscopic hematuria in 3/17. This was dilated with the cystoscope and no further therapy was necessary.   Nov 2021: Balloon dilation of urethral stricture prior to radiation   5) Bladder calculi: He is s/p cystolithalopaxy in  November 2021 prior to radiation therapy.     NON-GU PMH: Encounter for other preprocedural examination - 09/30/2022 Asthma, Asthma - 2014 Personal history of other diseases of the respiratory system, History of allergic rhinitis - 2014 Personal history of other endocrine, nutritional and metabolic disease, History of hyperlipidemia - 2014    FAMILY HISTORY: Dementia - Runs In Family Family Health Status Number - Runs In Family Father Deceased At Age86 ___ - Runs In Family   SOCIAL HISTORY: Marital Status: Married Preferred Language: English; Ethnicity: Not Hispanic Or Latino; Race: White Current Smoking Status: Patient has never smoked.  Has never drank.  Drinks 3 caffeinated drinks per day.     Notes: Drug Use, Alcohol Use, Never A Smoker,  Marital History - Currently Married   REVIEW OF SYSTEMS:    GU Review Male:   Patient denies frequent urination, hard to postpone urination, burning/ pain with urination, get up at night to urinate, leakage of urine, stream starts and stops, trouble starting your streams, and have to strain to urinate .  Gastrointestinal (Upper):   Patient denies nausea and vomiting.  Gastrointestinal (Lower):   Patient denies diarrhea and constipation.  Constitutional:   Patient denies night sweats, fever, fatigue, and weight loss.  Skin:   Patient denies skin rash/ lesion and itching.  Eyes:   Patient denies blurred vision and double vision.  Ears/ Nose/ Throat:   Patient denies sore throat and sinus problems.  Hematologic/Lymphatic:   Patient denies swollen glands and easy bruising.  Cardiovascular:   Patient denies leg swelling and chest pains.  Respiratory:   Patient denies cough and shortness of breath.  Endocrine:   Patient denies excessive thirst.  Musculoskeletal:   Patient denies back pain and joint pain.  Neurological:   Patient denies headaches and dizziness.  Psychologic:   Patient denies depression and anxiety.   VITAL SIGNS:      11/02/2022 10:33 AM  Weight 160 lb / 72.57 kg  Height 67 in / 170.18 cm  BP 124/71 mmHg  Pulse 64 /min  Temperature 96.9 F / 36.0 C  BMI 25.1 kg/m   MULTI-SYSTEM PHYSICAL EXAMINATION:    Constitutional: Well-nourished. No physical deformities. Normally developed. Good grooming.  Respiratory: No labored breathing, no use of accessory muscles.   Cardiovascular: Normal temperature, normal extremity pulses, no swelling, no varicosities.     Complexity of Data:  Records Review:   Previous Patient Records   05/21/22 10/09/21 04/07/21 07/30/20 06/21/19 12/27/18 07/04/18 10/27/17  PSA  Total PSA <0.015 ng/mL <0.02 ng/mL <0.015 ng/mL 0.018 ng/mL 10.60 ng/mL 6.75 ng/mL 7.67 ng/mL 5.53 ng/mL    10/09/21 04/07/21 07/30/20  Hormones  Testosterone, Total <10 ng/dL  <78 ng/dL <29 ng/dL    PROCEDURES:          Urinalysis w/Scope - 81001 Dipstick Dipstick Cont'd Micro  Color: Yellow Bilirubin: Neg WBC/hpf: 0 - 5/hpf  Appearance: Clear Ketones: Neg RBC/hpf: 3 - 10/hpf  Specific Gravity: 1.015 Blood: 2+ Bacteria: NS (Not Seen)  pH: <=5.0 Protein: Neg Cystals: NS (Not Seen)  Glucose: Neg Urobilinogen: 0.2 Casts: NS (Not Seen)    Nitrites: Neg Trichomonas: Not Present    Leukocyte Esterase: 1+ Mucous: Not Present      Epithelial Cells: NS (Not Seen)      Yeast: NS (Not Seen)      Sperm: Not Present    Notes:      ASSESSMENT:      ICD-10 Details  1 GU:  Ureteral calculus - N20.1   2   Ureteral obstruction - N13.1    PLAN:           Orders Labs Urine Culture          Schedule Return Visit/Planned Activity: Other See Visit Notes             Note: Will call to schedule surgery.          Document Letter(s):  Created for Patient: Clinical Summary         Notes:   1. Locally advanced prostate cancer: He will keep his scheduled appointment in September for PSA monitoring.   2. BPH/LUTS: We will continue to monitor this in September as scheduled.   3. Impacted right ureteral stone/right ureteral obstruction: I recommended that we keep his ureteral stent and plan to go back to the operating room for repeat ureteroscopy before removing his stent considering his recurrent obstruction on that side. If it appears that he has no persistent obstruction and his ureter has healed relatively well, we will consider removing his stent at that time proceeding with a renal scan prior to his appointment with me in September. Otherwise, we will consider replacing his stent and proceeding with discussion regarding further definitive reconstructive surgical options.   CC: Dr. Holley Bouche        Next Appointment:      Next Appointment: 02/04/2023 02:00 PM    Appointment Type: Laboratory Appointment    Location: [facility] [phone]     Provider: Lab LAB    Reason for Visit: 6 mo PSA      * Signed by Melvin Dunn, M.D. on 11/02/22 at 5:20 PM (EDT)*

## 2022-11-29 ENCOUNTER — Encounter (HOSPITAL_COMMUNITY): Payer: Self-pay | Admitting: Urology

## 2022-11-29 ENCOUNTER — Ambulatory Visit (HOSPITAL_COMMUNITY): Payer: Medicare Other

## 2022-11-29 ENCOUNTER — Encounter (HOSPITAL_COMMUNITY)
Admission: RE | Disposition: A | Discharge status: Home / Self Care | Payer: Self-pay | Source: Ambulatory Visit | Attending: Urology

## 2022-11-29 ENCOUNTER — Other Ambulatory Visit: Payer: Self-pay

## 2022-11-29 ENCOUNTER — Ambulatory Visit (HOSPITAL_COMMUNITY)
Admission: RE | Admit: 2022-11-29 | Discharge: 2022-11-29 | Disposition: A | Discharge status: Home / Self Care | Payer: Medicare Other | Source: Ambulatory Visit | Attending: Urology | Admitting: Urology

## 2022-11-29 ENCOUNTER — Ambulatory Visit (HOSPITAL_BASED_OUTPATIENT_CLINIC_OR_DEPARTMENT_OTHER): Payer: Medicare Other | Admitting: Physician Assistant

## 2022-11-29 ENCOUNTER — Ambulatory Visit (HOSPITAL_COMMUNITY): Payer: Medicare Other | Admitting: Physician Assistant

## 2022-11-29 DIAGNOSIS — Z87442 Personal history of urinary calculi: Secondary | ICD-10-CM | POA: Insufficient documentation

## 2022-11-29 DIAGNOSIS — Z79899 Other long term (current) drug therapy: Secondary | ICD-10-CM | POA: Diagnosis not present

## 2022-11-29 DIAGNOSIS — N401 Enlarged prostate with lower urinary tract symptoms: Secondary | ICD-10-CM | POA: Insufficient documentation

## 2022-11-29 DIAGNOSIS — E119 Type 2 diabetes mellitus without complications: Secondary | ICD-10-CM | POA: Insufficient documentation

## 2022-11-29 DIAGNOSIS — I2510 Atherosclerotic heart disease of native coronary artery without angina pectoris: Secondary | ICD-10-CM | POA: Insufficient documentation

## 2022-11-29 DIAGNOSIS — I509 Heart failure, unspecified: Secondary | ICD-10-CM | POA: Insufficient documentation

## 2022-11-29 DIAGNOSIS — C61 Malignant neoplasm of prostate: Secondary | ICD-10-CM | POA: Insufficient documentation

## 2022-11-29 DIAGNOSIS — Z952 Presence of prosthetic heart valve: Secondary | ICD-10-CM | POA: Insufficient documentation

## 2022-11-29 DIAGNOSIS — I10 Essential (primary) hypertension: Secondary | ICD-10-CM | POA: Diagnosis not present

## 2022-11-29 DIAGNOSIS — E785 Hyperlipidemia, unspecified: Secondary | ICD-10-CM | POA: Diagnosis not present

## 2022-11-29 DIAGNOSIS — N201 Calculus of ureter: Secondary | ICD-10-CM | POA: Insufficient documentation

## 2022-11-29 DIAGNOSIS — Z951 Presence of aortocoronary bypass graft: Secondary | ICD-10-CM | POA: Diagnosis not present

## 2022-11-29 DIAGNOSIS — J45909 Unspecified asthma, uncomplicated: Secondary | ICD-10-CM | POA: Diagnosis not present

## 2022-11-29 DIAGNOSIS — Z7984 Long term (current) use of oral hypoglycemic drugs: Secondary | ICD-10-CM | POA: Insufficient documentation

## 2022-11-29 DIAGNOSIS — Z09 Encounter for follow-up examination after completed treatment for conditions other than malignant neoplasm: Secondary | ICD-10-CM | POA: Diagnosis not present

## 2022-11-29 DIAGNOSIS — I350 Nonrheumatic aortic (valve) stenosis: Secondary | ICD-10-CM | POA: Diagnosis not present

## 2022-11-29 DIAGNOSIS — I110 Hypertensive heart disease with heart failure: Secondary | ICD-10-CM | POA: Insufficient documentation

## 2022-11-29 DIAGNOSIS — Z87448 Personal history of other diseases of urinary system: Secondary | ICD-10-CM | POA: Diagnosis not present

## 2022-11-29 HISTORY — PX: CYSTOSCOPY/URETEROSCOPY/HOLMIUM LASER/STENT PLACEMENT: SHX6546

## 2022-11-29 LAB — GLUCOSE, CAPILLARY
Glucose-Capillary: 103 mg/dL — ABNORMAL HIGH (ref 70–99)
Glucose-Capillary: 111 mg/dL — ABNORMAL HIGH (ref 70–99)

## 2022-11-29 SURGERY — CYSTOSCOPY/URETEROSCOPY/HOLMIUM LASER/STENT PLACEMENT
Anesthesia: General | Site: Ureter | Laterality: Right

## 2022-11-29 MED ORDER — DEXAMETHASONE SODIUM PHOSPHATE 10 MG/ML IJ SOLN
INTRAMUSCULAR | Status: AC
  Filled 2022-11-29: qty 1

## 2022-11-29 MED ORDER — 0.9 % SODIUM CHLORIDE (POUR BTL) OPTIME
TOPICAL | Status: DC | PRN
  Administered 2022-11-29: 1000 mL

## 2022-11-29 MED ORDER — ONDANSETRON HCL 4 MG/2ML IJ SOLN
INTRAMUSCULAR | Status: DC | PRN
  Administered 2022-11-29: 4 mg via INTRAVENOUS

## 2022-11-29 MED ORDER — ONDANSETRON HCL 4 MG/2ML IJ SOLN: 4.0000 mg | Freq: Once | INTRAMUSCULAR | Status: DC | PRN

## 2022-11-29 MED ORDER — LIDOCAINE HCL (PF) 2 % IJ SOLN
INTRAMUSCULAR | Status: AC
  Filled 2022-11-29: qty 5

## 2022-11-29 MED ORDER — FENTANYL CITRATE (PF) 100 MCG/2ML IJ SOLN
INTRAMUSCULAR | Status: DC | PRN
  Administered 2022-11-29: 25 ug via INTRAVENOUS

## 2022-11-29 MED ORDER — ONDANSETRON HCL 4 MG/2ML IJ SOLN
INTRAMUSCULAR | Status: AC
  Filled 2022-11-29: qty 2

## 2022-11-29 MED ORDER — OXYCODONE HCL 5 MG/5ML PO SOLN: 5.0000 mg | Freq: Once | ORAL | Status: DC | PRN

## 2022-11-29 MED ORDER — ORAL CARE MOUTH RINSE: 15.0000 mL | Freq: Once | OROMUCOSAL | Status: AC

## 2022-11-29 MED ORDER — FENTANYL CITRATE (PF) 100 MCG/2ML IJ SOLN
INTRAMUSCULAR | Status: AC
  Filled 2022-11-29: qty 2

## 2022-11-29 MED ORDER — CHLORHEXIDINE GLUCONATE 0.12 % MT SOLN
15.0000 mL | Freq: Once | OROMUCOSAL | Status: AC
  Administered 2022-11-29: 15 mL via OROMUCOSAL

## 2022-11-29 MED ORDER — OXYCODONE HCL 5 MG PO TABS: 5.0000 mg | ORAL_TABLET | Freq: Once | ORAL | Status: DC | PRN

## 2022-11-29 MED ORDER — SODIUM CHLORIDE 0.9 % IR SOLN
Status: DC | PRN
  Administered 2022-11-29: 3000 mL

## 2022-11-29 MED ORDER — PROPOFOL 10 MG/ML IV BOLUS
INTRAVENOUS | Status: AC
  Filled 2022-11-29: qty 20

## 2022-11-29 MED ORDER — ACETAMINOPHEN 500 MG PO TABS
1000.0000 mg | ORAL_TABLET | Freq: Once | ORAL | Status: AC
  Administered 2022-11-29: 1000 mg via ORAL
  Filled 2022-11-29: qty 2

## 2022-11-29 MED ORDER — PROPOFOL 10 MG/ML IV BOLUS
INTRAVENOUS | Status: DC | PRN
Start: 2022-11-29 — End: 2022-11-29
  Administered 2022-11-29: 150 mg via INTRAVENOUS

## 2022-11-29 MED ORDER — DEXAMETHASONE SODIUM PHOSPHATE 10 MG/ML IJ SOLN
INTRAMUSCULAR | Status: DC | PRN
  Administered 2022-11-29: 6 mg via INTRAVENOUS

## 2022-11-29 MED ORDER — CEFAZOLIN SODIUM-DEXTROSE 2-4 GM/100ML-% IV SOLN
2.0000 g | INTRAVENOUS | Status: AC
  Administered 2022-11-29: 2 g via INTRAVENOUS
  Filled 2022-11-29: qty 100

## 2022-11-29 MED ORDER — INSULIN ASPART 100 UNIT/ML IJ SOLN: 0.0000 [IU] | INTRAMUSCULAR | Status: DC | PRN

## 2022-11-29 MED ORDER — IOHEXOL 300 MG/ML  SOLN
INTRAMUSCULAR | Status: DC | PRN
  Administered 2022-11-29: 13 mL

## 2022-11-29 MED ORDER — LACTATED RINGERS IV SOLN: INTRAVENOUS | Status: DC

## 2022-11-29 MED ORDER — FENTANYL CITRATE PF 50 MCG/ML IJ SOSY: 25.0000 ug | PREFILLED_SYRINGE | INTRAMUSCULAR | Status: DC | PRN

## 2022-11-29 SURGICAL SUPPLY — 23 items
BAG COUNTER SPONGE SURGICOUNT (BAG) IMPLANT
BAG SPNG CNTER NS LX DISP (BAG)
BAG URO CATCHER STRL LF (MISCELLANEOUS) ×2 IMPLANT
BASKET ZERO TIP NITINOL 2.4FR (BASKET) IMPLANT
BSKT STON RTRVL ZERO TP 2.4FR (BASKET)
CATH URETL OPEN END 6FR 70 (CATHETERS) IMPLANT
CLOTH BEACON ORANGE TIMEOUT ST (SAFETY) ×2 IMPLANT
GLOVE SURG LX STRL 7.5 STRW (GLOVE) ×2 IMPLANT
GOWN STRL REUS W/ TWL XL LVL3 (GOWN DISPOSABLE) ×2 IMPLANT
GOWN STRL REUS W/TWL XL LVL3 (GOWN DISPOSABLE) ×1
GUIDEWIRE STR DUAL SENSOR (WIRE) ×2 IMPLANT
GUIDEWIRE ZIPWRE .038 STRAIGHT (WIRE) IMPLANT
IV NS 1000ML (IV SOLUTION) ×1
IV NS 1000ML BAXH (IV SOLUTION) ×2 IMPLANT
KIT TURNOVER KIT A (KITS) IMPLANT
LASER FIB FLEXIVA PULSE ID 365 (Laser) IMPLANT
MANIFOLD NEPTUNE II (INSTRUMENTS) ×2 IMPLANT
PACK CYSTO (CUSTOM PROCEDURE TRAY) ×2 IMPLANT
SHEATH NAVIGATOR HD 12/14X36 (SHEATH) IMPLANT
TRACTIP FLEXIVA PULS ID 200XHI (Laser) IMPLANT
TRACTIP FLEXIVA PULSE ID 200 (Laser)
TUBING CONNECTING 10 (TUBING) ×2 IMPLANT
TUBING UROLOGY SET (TUBING) ×2 IMPLANT

## 2022-11-29 NOTE — Anesthesia Procedure Notes (Signed)
Procedure Name: LMA Insertion Date/Time: 11/29/2022 3:28 PM  Performed by: Florene Route, CRNAPatient Re-evaluated:Patient Re-evaluated prior to induction Oxygen Delivery Method: Circle system utilized Preoxygenation: Pre-oxygenation with 100% oxygen Induction Type: IV induction LMA: LMA inserted LMA Size: 4.0 Number of attempts: 1 Placement Confirmation: positive ETCO2 and breath sounds checked- equal and bilateral Dental Injury: Teeth and Oropharynx as per pre-operative assessment

## 2022-11-29 NOTE — Anesthesia Postprocedure Evaluation (Incomplete)
Anesthesia Post Note  Patient: Melvin Dunn.  Procedure(s) Performed: CYSTOSCOPY/RIGHT RETROGRADE PYELOGRAM/RIGHT URETEROSCOPY/ STENT REMOVAL (Right: Ureter)     Anesthesia Type: General Anesthetic complications: no   No notable events documented.  Last Vitals:  Vitals:   11/29/22 1312  BP: 138/76  Pulse: 62  Resp: 16  Temp: 36.7 C  SpO2: 96%    Last Pain:  Vitals:   11/29/22 1331  TempSrc:   PainSc: 0-No pain                 Florene Route

## 2022-11-29 NOTE — Discharge Instructions (Signed)
 You may see some blood in the urine and may have some burning with urination for 48-72 hours. You also may notice that you have to urinate more frequently or urgently after your procedure which is normal.  You should call should you develop an inability urinate, fever > 101, persistent nausea and vomiting that prevents you from eating or drinking to stay hydrated.    

## 2022-11-29 NOTE — Anesthesia Postprocedure Evaluation (Signed)
Anesthesia Post Note  Patient: Melvin Dunn.  Procedure(s) Performed: CYSTOSCOPY/RIGHT RETROGRADE PYELOGRAM/RIGHT URETEROSCOPY/ STENT REMOVAL (Right: Ureter)     Patient location during evaluation: PACU Anesthesia Type: General Level of consciousness: awake and alert Pain management: pain level controlled Vital Signs Assessment: post-procedure vital signs reviewed and stable Respiratory status: spontaneous breathing, nonlabored ventilation and respiratory function stable Cardiovascular status: stable and blood pressure returned to baseline Anesthetic complications: no   No notable events documented.  Last Vitals:  Vitals:   11/29/22 1615 11/29/22 1624  BP: (!) 144/65 (!) 150/60  Pulse: (!) 54 (!) 55  Resp: 13 16  Temp:  (!) 36.4 C  SpO2: 99% 100%    Last Pain:  Vitals:   11/29/22 1624  TempSrc:   PainSc: 0-No pain                 Beryle Lathe

## 2022-11-29 NOTE — Interval H&P Note (Signed)
History and Physical Interval Note:  11/29/2022 2:40 PM  Melvin Dunn.  has presented today for surgery, with the diagnosis of RIGHT URETERAL CALCULUS/OBSTRUCTION.  The various methods of treatment have been discussed with the patient and family. After consideration of risks, benefits and other options for treatment, the patient has consented to  Procedure(s) with comments: CYSTOSCOPY/RIGHT RETROGRADE PYELOGRAM/RIGHT URETEROSCOPY/POSSIBLE STENT REMOVAL VERSUS REPLACEMENT (Right) - 60 MINUTES NEEDED FOR CASE as a surgical intervention.  The patient's history has been reviewed, patient examined, no change in status, stable for surgery.  I have reviewed the patient's chart and labs.  Questions were answered to the patient's satisfaction.     [employer]

## 2022-11-29 NOTE — Op Note (Signed)
Preoperative diagnosis: History of impacted right ureteral stone with right ureteral obstruction  Postoperative diagnosis: History of impacted right ureteral stone with right ureteral obstruction  Procedures: 1.  Cystoscopy 2.  Right ureteral stent removal 3.  Right retrograde pyelography with interpretation 4.  Right ureteroscopy  Surgeon: Moody Bruins MD  Anesthesia: General  Complications: None  EBL: Minimal  Specimens: None  Intraoperative findings: Retrograde pyelogram was performed with 6 French ureteral catheter and Omnipaque contrast.  This did demonstrate some narrowing of the mid ureter for segment of approximately 3 to 4 cm but without clear evidence of obstruction.  There was mild chronic right hydronephrosis.  He did appear to have drainage of his right kidney upon further monitoring.  Indication: Melvin Dunn is a 72 year old gentleman with who presented approximately 7 to 8 months ago with an asymptomatic right ureteral calculus collection that was impacted.  He underwent initial ureteroscopy/laser lithotripsy.  Follow-up imaging demonstrated persistent right hydronephrosis and further evaluation revealed further impacted mid right ureteral stone requiring further endoscopic treatment.  He did have an 8 [place] stent left in place for the past couple of months.  He returns today for further endoscopic evaluation and to see if ureteral stent removal would be appropriate.  The potential risks, complications, and expected recovery process were discussed in detail.  Informed consent was obtained.  Description of procedure: The patient was taken to the operating room and a general anesthetic was administered.  He was given preoperative antibiotics, placed in the dorsolithotomy position, prepped and draped in usual sterile fashion.  Next, preoperative timeout was performed.  Cystourethroscopy was performed which revealed a normal anterior and posterior urethra.  Inspection of  bladder revealed his indwelling ureteral stent which was brought out to the urethral meatus using flexible graspers.  The stent was then removed completely and a 6 French ureteral catheter was used to intubate the right ureteral orifice.  Omnipaque contrast was injected with findings as dictated above.  There was a 3 to 4 cm area in the mid ureter concerning for probable stricture although this was not indicative of complete obstruction.  Endoscopic evaluation with a semirigid ureteroscope was then performed and confirmed an area of narrowing although the semirigid ureteroscope was able to be advanced past this area into the proximal ureter.  There was fibrosis.  Additional Omnipaque contrast was injected and this was monitored and appeared to drain.  It was felt that he could be left without an indwelling stent at this time with follow-up imaging to determine if he was destined to develop recurrent obstruction.  If so, he likely would require more definitive surgical reconstruction.  The bladder was emptied and the procedure was ended.  He tolerated the procedure well without complications.  He was able to be extubated and transferred to recovery in satisfactory condition.

## 2022-11-29 NOTE — Transfer of Care (Signed)
Immediate Anesthesia Transfer of Care Note  Patient: Melvin Dunn.  Procedure(s) Performed: CYSTOSCOPY/RIGHT RETROGRADE PYELOGRAM/RIGHT URETEROSCOPY/ STENT REMOVAL (Right: Ureter)  Patient Location: PACU  Anesthesia Type:General  Level of Consciousness: awake, alert , and oriented  Airway & Oxygen Therapy: Patient Spontanous Breathing and Patient connected to face mask oxygen  Post-op Assessment: Report given to RN and Post -op Vital signs reviewed and stable  Post vital signs: Reviewed and stable  Last Vitals:  Vitals Value Taken Time  BP    Temp    Pulse    Resp 22 11/29/22 1553  SpO2    Vitals shown include unfiled device data.  Last Pain:  Vitals:   11/29/22 1331  TempSrc:   PainSc: 0-No pain         Complications: No notable events documented.

## 2022-11-29 NOTE — Anesthesia Preprocedure Evaluation (Addendum)
Anesthesia Evaluation  Patient identified by MRN, date of birth, ID band Patient awake    Reviewed: Allergy & Precautions, NPO status , Patient's Chart, lab work & pertinent test results, reviewed documented beta blocker date and time   History of Anesthesia Complications Negative for: history of anesthetic complications  Airway Mallampati: II  TM Distance: <3 FB Neck ROM: Full    Dental  (+) Dental Advisory Given, Teeth Intact   Pulmonary asthma    Pulmonary exam normal        Cardiovascular hypertension, Pt. on home beta blockers and Pt. on medications + CAD and + CABG  Normal cardiovascular exam+ Valvular Problems/Murmurs (s/p TAVR)    '24 TTE - EF 60 to 65%. Right ventricular systolic function is mildly reduced. The right  ventricular size is mildly enlarged. Trivial mitral valve regurgitation. The aortic valve has been repaired/replaced. Aortic valve regurgitation is mild. There is a 23 mm Edwards Sapien prosthetic (TAVR) valve present in the aortic position. Mild PVL. Appears 2 AI jets of mild severity, one paravalvular and one transvalvular     Neuro/Psych Seizures - (likely due to IV lidocaine during surgical procedure),   negative psych ROS   GI/Hepatic negative GI ROS, Neg liver ROS,,,  Endo/Other  diabetes, Type 2, Oral Hypoglycemic Agents    Renal/GU negative Renal ROS     Musculoskeletal negative musculoskeletal ROS (+)    Abdominal   Peds  Hematology negative hematology ROS (+)   Anesthesia Other Findings   Reproductive/Obstetrics                             Anesthesia Physical Anesthesia Plan  ASA: 3  Anesthesia Plan: General   Post-op Pain Management: Tylenol PO (pre-op)*   Induction: Intravenous  PONV Risk Score and Plan: 2 and Treatment may vary due to age or medical condition, Ondansetron and Dexamethasone  Airway Management Planned: LMA  Additional Equipment:  None  Intra-op Plan:   Post-operative Plan: Extubation in OR  Informed Consent: I have reviewed the patients History and Physical, chart, labs and discussed the procedure including the risks, benefits and alternatives for the proposed anesthesia with the patient or authorized representative who has indicated his/her understanding and acceptance.     Dental advisory given  Plan Discussed with: CRNA and Anesthesiologist  Anesthesia Plan Comments:         Anesthesia Quick Evaluation

## 2022-11-30 ENCOUNTER — Encounter (HOSPITAL_COMMUNITY): Payer: Self-pay | Admitting: Urology

## 2022-12-01 ENCOUNTER — Other Ambulatory Visit (HOSPITAL_COMMUNITY): Payer: Self-pay | Admitting: Urology

## 2022-12-01 DIAGNOSIS — N131 Hydronephrosis with ureteral stricture, not elsewhere classified: Secondary | ICD-10-CM

## 2022-12-03 ENCOUNTER — Other Ambulatory Visit: Payer: Self-pay | Admitting: Cardiology

## 2023-01-20 ENCOUNTER — Other Ambulatory Visit: Payer: Self-pay | Admitting: Cardiology

## 2023-01-21 ENCOUNTER — Encounter (HOSPITAL_COMMUNITY)
Admission: RE | Admit: 2023-01-21 | Discharge: 2023-01-21 | Disposition: A | Discharge status: Home / Self Care | Payer: Medicare Other | Source: Ambulatory Visit | Attending: Urology | Admitting: Urology

## 2023-01-21 ENCOUNTER — Encounter (HOSPITAL_COMMUNITY): Payer: Self-pay

## 2023-01-21 DIAGNOSIS — N131 Hydronephrosis with ureteral stricture, not elsewhere classified: Secondary | ICD-10-CM | POA: Insufficient documentation

## 2023-01-31 ENCOUNTER — Encounter (HOSPITAL_COMMUNITY)
Admission: RE | Admit: 2023-01-31 | Discharge: 2023-01-31 | Disposition: A | Discharge status: Home / Self Care | Payer: Medicare Other | Source: Ambulatory Visit | Attending: Urology | Admitting: Urology

## 2023-01-31 DIAGNOSIS — N1330 Unspecified hydronephrosis: Secondary | ICD-10-CM | POA: Diagnosis not present

## 2023-01-31 DIAGNOSIS — N131 Hydronephrosis with ureteral stricture, not elsewhere classified: Secondary | ICD-10-CM | POA: Insufficient documentation

## 2023-01-31 MED ORDER — FUROSEMIDE 10 MG/ML IJ SOLN
40.0000 mg | Freq: Once | INTRAMUSCULAR | Status: AC
  Administered 2023-01-31: 40 mg via INTRAVENOUS

## 2023-01-31 MED ORDER — TECHNETIUM TC 99M MERTIATIDE
5.4100 | Freq: Once | INTRAVENOUS | Status: AC
  Administered 2023-01-31: 5.41 via INTRAVENOUS

## 2023-01-31 MED ORDER — FUROSEMIDE 10 MG/ML IJ SOLN
INTRAMUSCULAR | Status: AC
  Filled 2023-01-31: qty 4

## 2023-02-05 DIAGNOSIS — E119 Type 2 diabetes mellitus without complications: Secondary | ICD-10-CM | POA: Diagnosis not present

## 2023-02-11 DIAGNOSIS — N130 Hydronephrosis with ureteropelvic junction obstruction: Secondary | ICD-10-CM | POA: Diagnosis not present

## 2023-02-11 DIAGNOSIS — R3914 Feeling of incomplete bladder emptying: Secondary | ICD-10-CM | POA: Diagnosis not present

## 2023-03-04 DIAGNOSIS — E1169 Type 2 diabetes mellitus with other specified complication: Secondary | ICD-10-CM | POA: Diagnosis not present

## 2023-03-04 DIAGNOSIS — I5032 Chronic diastolic (congestive) heart failure: Secondary | ICD-10-CM | POA: Diagnosis not present

## 2023-03-04 DIAGNOSIS — E119 Type 2 diabetes mellitus without complications: Secondary | ICD-10-CM | POA: Diagnosis not present

## 2023-03-04 DIAGNOSIS — Z23 Encounter for immunization: Secondary | ICD-10-CM | POA: Diagnosis not present

## 2023-03-04 DIAGNOSIS — E7800 Pure hypercholesterolemia, unspecified: Secondary | ICD-10-CM | POA: Diagnosis not present

## 2023-03-04 DIAGNOSIS — J4520 Mild intermittent asthma, uncomplicated: Secondary | ICD-10-CM | POA: Diagnosis not present

## 2023-04-19 ENCOUNTER — Ambulatory Visit: Payer: Medicare Other | Admitting: Pulmonary Disease

## 2023-04-19 ENCOUNTER — Encounter: Payer: Self-pay | Admitting: Pulmonary Disease

## 2023-04-19 VITALS — BP 110/60 | HR 68 | Temp 97.7°F | Ht 67.0 in | Wt 158.0 lb

## 2023-04-19 DIAGNOSIS — J4520 Mild intermittent asthma, uncomplicated: Secondary | ICD-10-CM | POA: Diagnosis not present

## 2023-04-19 DIAGNOSIS — J449 Chronic obstructive pulmonary disease, unspecified: Secondary | ICD-10-CM | POA: Diagnosis not present

## 2023-04-19 MED ORDER — ALBUTEROL SULFATE HFA 108 (90 BASE) MCG/ACT IN AERS: 1.0000 | INHALATION_SPRAY | Freq: Four times a day (QID) | RESPIRATORY_TRACT | 1 refills | Status: DC | PRN

## 2023-04-19 MED ORDER — ADVAIR DISKUS 100-50 MCG/ACT IN AEPB
1.0000 | INHALATION_SPRAY | Freq: Two times a day (BID) | RESPIRATORY_TRACT | 11 refills | Status: DC
Start: 2023-04-19 — End: 2023-04-30

## 2023-04-19 NOTE — Patient Instructions (Addendum)
Your breathing tests show mild obstruction consistent with COPD  Start advair diskus 100-62mcg 1 puff twice daily - rinse mouth out after each use  Use as needed albuterol inhaler 1-2 puffs every 4-6 hours as needed  Follow up in 1 year, call sooner if needed.

## 2023-04-19 NOTE — Progress Notes (Signed)
Synopsis: Referred in April 2024 for asthma by Sanda Klein, PA  Subjective:   PATIENT ID: Melvin Dunn. GENDER: male DOB: 1950-05-24, MRN: [id number]  HPI  Chief Complaint  Patient presents with   Follow-up    F/u after PFT.   Melvin Dunn is a 72 year old male, never smoker with CAD, CHF, HTN, s/p TAVR and prostate cancer who returns to pulmonary clinic for asthma.   Initial OV 09/01/22 He reports he had asthma in childhood that improved in his teens and 67s. About 10 years ago he started to notice a return of symptoms and was using as needed albuterol. He reports increased symptoms a month ago with cough, wheezing, shortness of breath and chest tightness. He started to feel better over the past week. He was given steroid injection and a prednisone taper and started on an inhaled steroid. He was not treated with an antibiotic. He has issues with seasonal allergies with sinus drainage. He peports he is 85-90% of his normal baseline health status. He has not required albuterol since starting inhaled steroid.  No has no issues for heartburn/reflux and is taking omeprazole.   No smoking. No second hand smoke exposure. 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 He has 3 cats at home.    Today OV 04/19/23 He presents for a follow-up visit after a recent pulmonary function test. They report a significant improvement in their respiratory symptoms since their last visit, and have not needed to use their Arnuity Ellipta inhaler for at least two months. 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 The patient has been managing well without any maintenance inhalers, experiencing no cough, wheezing, chest tightness, or shortness of breath in their day-to-day activities. Cold air has not been a trigger for their symptoms.  Past Medical  History:  Diagnosis Date   Asthma    current inhaler use   CAD (coronary artery disease), native coronary artery    LIMA to LAD, SVG to OM1, SVG to RCA-RPL, EVH via right thigh and leg   CHF (congestive heart failure) (HCC)    Colon polyp    Diabetes mellitus without complication (HCC)    Environmental allergies    History of kidney stones    Hyperlipidemia    Hypertension    Prostate cancer (HCC)    radiation seeds no CA now   S/P TAVR (transcatheter aortic valve replacement) 10/13/2021   s/p TAVR with a 23mm Edwards S3UR via the left carotid approach by Dr. Lynnette Caffey & Dr. Laneta Simmers   Seizure [facility])    due to lidocaine allergy with prostate biopsy this year   Severe aortic stenosis      Family History  Problem Relation Age of Onset   Alzheimer's disease Mother    Stroke Mother    Skin cancer Mother    Cancer - Lung Father    Cancer Father        asbestos   Heart attack Maternal Grandfather    Breast cancer Neg Hx    Colon cancer Neg Hx    Prostate cancer Neg Hx    Pancreatic cancer Neg Hx      Social History   Socioeconomic History   Marital status: Married    Spouse name: Not on file   Number of children: Not  on file   Years of education: Not on file   Highest education level: Not on file  Occupational History    Comment: full time  Tobacco Use   Smoking status: Never   Smokeless tobacco: Never  Vaping Use   Vaping status: Never Used  Substance and Sexual Activity   Alcohol use: No   Drug use: No   Sexual activity: Yes  Other Topics Concern   Not on file  Social History Narrative   Not on file   Social Determinants of Health   Financial Resource Strain: Not on file  Food Insecurity: Not on file  Transportation Needs: Not on file  Physical Activity: Not on file  Stress: Not on file  Social Connections: Not on file  Intimate Partner Violence: Not on file     Allergies  Allergen Reactions   Lidocaine Other (See Comments)    Seizure with prostate  biopsy with lidocaine this year, ? Given too much   Promethazine Hcl     stomach upset     Outpatient Medications Prior to Visit  Medication Sig Dispense Refill   acetaminophen (TYLENOL) 325 MG tablet Take 650 mg by mouth every 6 (six) hours as needed for moderate pain.     aspirin 81 MG chewable tablet Chew 81 mg by mouth daily.     atorvastatin (LIPITOR) 80 MG tablet TAKE 1 TABLET BY MOUTH EVERY DAY 90 tablet 3   Bempedoic Acid-Ezetimibe (NEXLIZET) 180-10 MG TABS Take 1 tablet by mouth daily. Pt. Will need to make an appointment  in order to receive future refills. First attempt. 30 tablet 0   fenofibrate (TRICOR) 145 MG tablet TAKE 1 TABLET BY MOUTH EVERY DAY 90 tablet 2   fluticasone (FLONASE) 50 MCG/ACT nasal spray Place 1 spray into both nostrils daily.     Fluticasone Furoate (ARNUITY ELLIPTA) 200 MCG/ACT AEPB Inhale 1 puff into the lungs daily.     furosemide (LASIX) 20 MG tablet TAKE 1 TABLET BY MOUTH EVERY DAY 90 tablet 3   levocetirizine (XYZAL) 5 MG tablet Take 5 mg by mouth every evening.     metFORMIN (GLUCOPHAGE) 500 MG tablet Take 500 mg by mouth daily with breakfast.     metoprolol tartrate (LOPRESSOR) 50 MG tablet Take 50 mg by mouth 2 (two) times daily.     montelukast (SINGULAIR) 10 MG tablet Take 10 mg by mouth daily.     nitroGLYCERIN (NITROSTAT) 0.4 MG SL tablet Place 1 tablet (0.4 mg total) under the tongue every 5 (five) minutes as needed for chest pain. 25 tablet 3   traMADol (ULTRAM) 50 MG tablet Take 1-2 tablets (50-100 mg total) by mouth every 6 (six) hours as needed (pain). 15 tablet 0   VASCEPA 1 g capsule Take 2 capsules (2 g total) by mouth 2 (two) times daily. 360 capsule 3   albuterol (VENTOLIN HFA) 108 (90 Base) MCG/ACT inhaler Inhale 1-2 puffs into the lungs every 6 (six) hours as needed for wheezing or shortness of breath.     Facility-Administered Medications Prior to Visit  Medication Dose Route Frequency Provider Last Rate Last Admin   sodium  phosphate (FLEET) 7-19 GM/118ML enema 1 enema  1 enema Rectal Once Heloise Purpura, MD        Review of Systems  Constitutional:  Negative for chills, fever, malaise/fatigue and weight loss.  HENT:  Negative for congestion, sinus pain and sore throat.   Eyes: Negative.   Respiratory:  Positive for cough and  shortness of breath. Negative for hemoptysis, sputum production and wheezing.   Cardiovascular:  Negative for chest pain, palpitations, orthopnea, claudication and leg swelling.  Gastrointestinal:  Negative for abdominal pain, heartburn, nausea and vomiting.  Genitourinary: Negative.   Musculoskeletal:  Negative for joint pain and myalgias.  Skin:  Negative for rash.  Neurological:  Negative for weakness.  Endo/Heme/Allergies: Negative.   Psychiatric/Behavioral: Negative.     Objective:   Vitals:   04/19/23 1546  BP: 110/60  Pulse: 68  Temp: 97.7 F (36.5 C)  TempSrc: Oral  SpO2: 97%  Weight: 158 lb (71.7 kg)  Height: 5\' 7"  (1.702 m)      Physical Exam Constitutional:      General: He is not in acute distress.    Appearance: Normal appearance.  HENT:     Head: Normocephalic and atraumatic.  Eyes:     General: No scleral icterus. Cardiovascular:     Rate and Rhythm: Normal rate and regular rhythm.     Pulses: Normal pulses.     Heart sounds: Normal heart sounds. No murmur heard. Pulmonary:     Effort: Pulmonary effort is normal.     Breath sounds: Normal breath sounds. No wheezing, rhonchi or rales.  Musculoskeletal:     Right lower leg: No edema.     Left lower leg: No edema.  Skin:    General: Skin is warm and dry.  Neurological:     General: No focal deficit present.     Mental Status: He is alert.    CBC    Component Value Date/Time   WBC 10.7 (H) 11/12/2022 1429   RBC 4.26 11/12/2022 1429   HGB 12.2 (L) 11/12/2022 1429   HGB 12.0 (L) 07/24/2021 1118   HCT 37.7 (L) 11/12/2022 1429   HCT 36.9 (L) 07/24/2021 1118   PLT 302 11/12/2022 1429   PLT  290 07/24/2021 1118   MCV 88.5 11/12/2022 1429   MCV 87 07/24/2021 1118   MCH 28.6 11/12/2022 1429   MCHC 32.4 11/12/2022 1429   RDW 15.8 (H) 11/12/2022 1429   RDW 14.5 07/24/2021 1118   LYMPHSABS 1.0 04/16/2022 0810   MONOABS 0.7 04/16/2022 0810   EOSABS 0.9 (H) 04/16/2022 0810   BASOSABS 0.1 04/16/2022 0810      Latest Ref Rng & Units 11/12/2022    2:29 PM 10/08/2022    2:27 PM 04/30/2022    8:13 AM  BMP  Glucose 70 - 99 mg/dL 562  130  865   BUN 8 - 23 mg/dL 26  22  24    Creatinine 0.61 - 1.24 mg/dL 7.84  6.96  2.95   Sodium 135 - 145 mmol/L 137  138  145   Potassium 3.5 - 5.1 mmol/L 3.8  3.9  4.3   Chloride 98 - 111 mmol/L 106  103  112   CO2 22 - 32 mmol/L 22  26  28    Calcium 8.9 - 10.3 mg/dL 9.4  9.4  9.9    Chest imaging:  PFT:     No data to display          Labs:  Path:  Echo: 11/13/21: LV EF 60-65%. Mild concentric left ventricular hypertrophy. RV systolic function and size is normal.   Heart Catheterization:  Assessment & Plan:   Chronic obstructive pulmonary disease, unspecified COPD type (HCC) - Plan: albuterol (VENTOLIN HFA) 108 (90 Base) MCG/ACT inhaler  Discussion: Melvin Dunn is a 72 year old male, never smoker with  CAD, CHF, HTN, s/p TAVR and prostate cancer who is referred to pulmonary clinic for COPD/Asthma.  COPD/Asthma Mild to moderate obstructive defect on pulmonary function test with significant response to albuterol. Patient has been asymptomatic without maintenance inhaler for the past two months. -Discontinue Arnuity Ellipta as patient has been asymptomatic without it. -Ensure availability of Albuterol rescue inhaler for emergency use. Check expiration dates on current inhalers at home. -Start Advair Diskus twice daily as a preventive measure given history of aggressive asthma. Rinse mouth after each use to prevent fungal infection. -Follow-up in 1 year or sooner if symptoms worsen.  Melody Comas, MD [facility] Office: [phone]   Current Outpatient Medications:    acetaminophen (TYLENOL) 325 MG tablet, Take 650 mg by mouth every 6 (six) hours as needed for moderate pain., Disp: , Rfl:    aspirin 81 MG chewable tablet, Chew 81 mg by mouth daily., Disp: , Rfl:    atorvastatin (LIPITOR) 80 MG tablet, TAKE 1 TABLET BY MOUTH EVERY DAY, Disp: 90 tablet, Rfl: 3   Bempedoic Acid-Ezetimibe (NEXLIZET) 180-10 MG TABS, Take 1 tablet by mouth daily. Pt. Will need to make an appointment  in order to receive future refills. First attempt., Disp: 30 tablet, Rfl: 0   fenofibrate (TRICOR) 145 MG tablet, TAKE 1 TABLET BY MOUTH EVERY DAY, Disp: 90 tablet, Rfl: 2   fluticasone (FLONASE) 50 MCG/ACT nasal spray, Place 1 spray into both nostrils daily., Disp: , Rfl:    Fluticasone Furoate (ARNUITY ELLIPTA) 200 MCG/ACT AEPB, Inhale 1 puff into the lungs daily., Disp: , Rfl:    furosemide (LASIX) 20 MG tablet, TAKE 1 TABLET BY MOUTH EVERY DAY, Disp: 90 tablet, Rfl: 3   levocetirizine (XYZAL) 5 MG tablet, Take 5 mg by mouth every evening., Disp: , Rfl:    metFORMIN (GLUCOPHAGE) 500 MG tablet, Take 500 mg by mouth daily with breakfast., Disp: , Rfl:    metoprolol tartrate (LOPRESSOR) 50 MG tablet, Take 50 mg by mouth 2 (two) times daily., Disp: , Rfl:    montelukast (SINGULAIR) 10 MG tablet, Take 10 mg by mouth daily., Disp: , Rfl:    nitroGLYCERIN (NITROSTAT) 0.4 MG SL tablet, Place 1 tablet (0.4 mg total) under the tongue every 5 (five) minutes as needed for chest pain., Disp: 25 tablet, Rfl: 3   traMADol (ULTRAM) 50 MG tablet, Take 1-2 tablets (50-100 mg total) by mouth every 6 (six) hours as needed (pain)., Disp: 15 tablet, Rfl: 0   VASCEPA 1 g capsule, Take 2 capsules (2 g total) by mouth 2 (two) times daily., Disp: 360 capsule, Rfl: 3   albuterol (VENTOLIN HFA) 108 (90 Base) MCG/ACT inhaler, Inhale 1-2 puffs into the lungs every 6 (six) hours as needed for wheezing or shortness of breath., Disp: 8 g, Rfl:  1 No current facility-administered medications for this visit.  Facility-Administered Medications Ordered in Other Visits:    sodium phosphate (FLEET) 7-19 GM/118ML enema 1 enema, 1 enema, Rectal, Once, Heloise Purpura, MD

## 2023-04-19 NOTE — Patient Instructions (Signed)
Full PFT performed today. °

## 2023-04-19 NOTE — Progress Notes (Signed)
Full PFT performed today. °

## 2023-04-22 ENCOUNTER — Encounter: Payer: Self-pay | Admitting: Pulmonary Disease

## 2023-04-22 ENCOUNTER — Telehealth: Payer: Self-pay | Admitting: Pulmonary Disease

## 2023-04-22 NOTE — Telephone Encounter (Signed)
Called [facility] spoke with pharmacist who stated inhaler not covered but after re-running claim processed for $11. Called patient and made aware. NFN

## 2023-04-22 NOTE — Telephone Encounter (Signed)
Patient states [facility] pharmacy did not get the Advair Diskus. Patient phone number is [phone].

## 2023-04-26 ENCOUNTER — Telehealth: Payer: Self-pay | Admitting: Pulmonary Disease

## 2023-04-26 ENCOUNTER — Other Ambulatory Visit (HOSPITAL_COMMUNITY): Payer: Self-pay

## 2023-04-26 DIAGNOSIS — J449 Chronic obstructive pulmonary disease, unspecified: Secondary | ICD-10-CM

## 2023-04-26 LAB — PULMONARY FUNCTION TEST
DL/VA % pred: 104 %
DL/VA: 4.24 ml/min/mmHg/L
DLCO cor % pred: 85 %
DLCO cor: 19.93 ml/min/mmHg
DLCO unc % pred: 85 %
DLCO unc: 19.93 ml/min/mmHg
FEF 25-75 Post: 1.68 L/s
FEF 25-75 Pre: 0.89 L/s
FEF2575-%Change-Post: 89 %
FEF2575-%Pred-Post: 80 %
FEF2575-%Pred-Pre: 42 %
FEV1-%Change-Post: 18 %
FEV1-%Pred-Post: 68 %
FEV1-%Pred-Pre: 57 %
FEV1-Post: 1.92 L
FEV1-Pre: 1.62 L
FEV1FVC-%Change-Post: 3 %
FEV1FVC-%Pred-Pre: 88 %
FEV6-%Change-Post: 16 %
FEV6-%Pred-Post: 79 %
FEV6-%Pred-Pre: 67 %
FEV6-Post: 2.86 L
FEV6-Pre: 2.45 L
FEV6FVC-%Change-Post: 1 %
FEV6FVC-%Pred-Post: 106 %
FEV6FVC-%Pred-Pre: 105 %
FVC-%Change-Post: 14 %
FVC-%Pred-Post: 74 %
FVC-%Pred-Pre: 64 %
FVC-Post: 2.86 L
FVC-Pre: 2.49 L
Post FEV1/FVC ratio: 67 %
Post FEV6/FVC ratio: 100 %
Pre FEV1/FVC ratio: 65 %
Pre FEV6/FVC Ratio: 98 %
RV % pred: 117 %
RV: 2.73 L
TLC % pred: 84 %
TLC: 5.45 L

## 2023-04-26 NOTE — Telephone Encounter (Signed)
Pt pharmacy keeps trying to give him Wixela and he needs Advair

## 2023-04-26 NOTE — Telephone Encounter (Signed)
Melvin Dunn is a generic form of Advair (fluticasone-salmetrol). Insurance will not pay for brand as generic is preferred.

## 2023-04-30 MED ORDER — ADVAIR DISKUS 100-50 MCG/ACT IN AEPB: 1.0000 | INHALATION_SPRAY | Freq: Two times a day (BID) | RESPIRATORY_TRACT | 11 refills | Status: DC

## 2023-04-30 NOTE — Telephone Encounter (Signed)
Resent Rx in specifying brand name.

## 2023-05-24 NOTE — Progress Notes (Signed)
 Cardiology Office Note:    Date:  05/26/2023   ID:  Melvin Dunn., DOB 1950-12-12, MRN [id number]  PCP:  Arloa Elsie JONELLE, MD  Cardiologist:  Wilbert Bihari, MD    Referring MD: Arloa Elsie JONELLE, MD   Chief Complaint  Patient presents with   Coronary Artery Disease   Atrial Flutter   Aortic Stenosis   Congestive Heart Failure   Hyperlipidemia    History of Present Illness:    Melvin Dunn. is a 73 y.o. male with a hx of dyslipidemia, diabetes and ASCAD with cath revealing critical left main stenosis and multivessel ASCAD with normal LVF.  He underwent CABG with LIMA to LAD, SVG to OM, SVG to distal RCA/PL.    He also has a hx of moderate to severe AS with echo 11/2018 showing normal LVF with EF 55-60% with moderate to severe AS with peak AV velocity of 339cm/s, mean AVG , AVA 0.77cm2 and dimensionless index 0.29.  He was referred to Dr. Wendel and underwent cardiac catheterization revealing 70% proximal graft 1 lesion before RPDA followed by 80% proximal graft to lesion before RPDA and 60% distal graft lesion before RPDA, 80% mid LM, occluded mid LAD, 70% proximal LAD, occluded distal RCA and occluded ostial left circumflex with a patent LIMA to the LAD and SVG to the OM 2.    He underwent TAVR on 10/13/2021 with 23 mm Celestia MOLDER are via left carotid artery approach by Dr. Wendel and Dr. Lucas.  2D echo 09/2022 showed EF EF 60-65% with mild RV dysfunction,  and mild RVE, stable TAVR with meran TAVG gradient and mild PVL.   He is here today for followup and is doing well.  He denies any chest pain or pressure, SOB, DOE, PND, orthopnea, LE edema, dizziness, palpitations or syncope. He is compliant with his meds and is tolerating meds with no SE.    Past Medical History:  Diagnosis Date   Asthma    current inhaler use   CAD (coronary artery disease), native coronary artery    LIMA to LAD, SVG to OM1, SVG to RCA-RPL, EVH via right thigh and leg   CHF (congestive  heart failure) (HCC)    Colon polyp    Diabetes mellitus without complication (HCC)    Environmental allergies    History of kidney stones    Hyperlipidemia    Hypertension    Prostate cancer (HCC)    radiation seeds no CA now   S/P TAVR (transcatheter aortic valve replacement) 10/13/2021   s/p TAVR with a 23mm Edwards S3UR via the left carotid approach by Dr. Wendel & Dr. Lucas   Seizure [facility])    due to lidocaine  allergy with prostate biopsy this year   Severe aortic stenosis     Past Surgical History:  Procedure Laterality Date   CORONARY ARTERY BYPASS GRAFT N/A 02/13/2014   Procedure: CORONARY ARTERY BYPASS GRAFTING (CABG) TIMES FOUR USING LEFT INTERNAL MAMMARY ARTERY AND RIGHT SAPHENOUS LEG VEIN HARVESTED ENDOSCOPICALLY;  Surgeon: Sudie VEAR Laine, MD;  Location: [facility] OR;  Service: Open Heart Surgery;  Laterality: N/A;   CYSTOSCOPY WITH LITHOLAPAXY N/A 03/17/2020   Procedure: CYSTOSCOPY WITH LITHOLAPAXY;  Surgeon: [name], MD;  Location: [facility];  Service: Urology;  Laterality: N/A;   CYSTOSCOPY/URETEROSCOPY/HOLMIUM LASER/STENT PLACEMENT Right 04/01/2022   Procedure: CYSTOSCOPY/RIGHT URETEROSCOP/RIGHT RETROGRADE PYELOGRAM;  Surgeon: [name], MD;  Location: [facility]S;  Service: Urology;  Laterality: Right;  60 MINUTES NEEDED  FOR CASE   CYSTOSCOPY/URETEROSCOPY/HOLMIUM LASER/STENT PLACEMENT Right 05/03/2022   Procedure: CYSTOSCOPY/RIGHT URETEROSCOPY/HOLMIUM LASER/RIGHT STENT PLACEMENT;  Surgeon: [name], MD;  Location: [facility];  Service: Urology;  Laterality: Right;  90 MINUTES NEEDED FOR CASE   CYSTOSCOPY/URETEROSCOPY/HOLMIUM LASER/STENT PLACEMENT Right 10/14/2022   Procedure: CYSTOSCOPY/RIGHT URETEROSCOPY WITH  HOLMIUM LASER/RIGHT RETROGRADE PYELOGRAPHY/RIGHT URETERAL STENT PLACEMENT;  Surgeon: [name], MD;  Location: [facility];  Service: Urology;  Laterality: Right;  60 MINUTES NEEDED FRO CASE   CYSTOSCOPY/URETEROSCOPY/HOLMIUM LASER/STENT  PLACEMENT Right 11/29/2022   Procedure: CYSTOSCOPY/RIGHT RETROGRADE PYELOGRAM/RIGHT URETEROSCOPY/ STENT REMOVAL;  Surgeon: [name], MD;  Location: [facility];  Service: Urology;  Laterality: Right;  60 MINUTES NEEDED FOR CASE   cytoscopy  15 yrs ago   EXTRACORPOREAL SHOCK WAVE LITHOTRIPSY  15 yrs ago   EYE SURGERY Bilateral 2015   cataracts   GOLD SEED IMPLANT N/A 03/17/2020   Procedure: GOLD SEED IMPLANT;  Surgeon: [name], MD;  Location: [facility];  Service: Urology;  Laterality: N/A;   INTRAOPERATIVE TRANSESOPHAGEAL ECHOCARDIOGRAM N/A 02/13/2014   Procedure: INTRAOPERATIVE TRANSESOPHAGEAL ECHOCARDIOGRAM;  Surgeon: Sudie VEAR Laine, MD;  Location[facility] OR;  Service: Open Heart Surgery;  Laterality: N/A;   INTRAOPERATIVE TRANSESOPHAGEAL ECHOCARDIOGRAM N/A 10/13/2021   Procedure: INTRAOPERATIVE TRANSESOPHAGEAL ECHOCARDIOGRAM;  Surgeon: Wendel Lurena POUR, MD;  Location: [facility] OR;  Service: Open Heart Surgery;  Laterality: N/A;   IR URETERAL STENT RIGHT NEW ACCESS W/SEP NEPHROSTOMY CATH  04/16/2022   LEFT HEART CATHETERIZATION WITH CORONARY ANGIOGRAM N/A 02/12/2014   Procedure: LEFT HEART CATHETERIZATION WITH CORONARY ANGIOGRAM;  Surgeon: Ozell JONETTA Fell, MD;  Location: [facility] CATH LAB;  Service: Cardiovascular;  Laterality: N/A;   left leg surgery for fracture  2004   plates inserted   PROSTATE BIOPSY  06/2019   RIGHT/LEFT HEART CATH AND CORONARY/GRAFT ANGIOGRAPHY N/A 07/31/2021   Procedure: RIGHT/LEFT HEART CATH AND CORONARY/GRAFT ANGIOGRAPHY;  Surgeon: Wendel Lurena POUR, MD;  Location: [facility] INVASIVE CV LAB;  Service: Cardiovascular;  Laterality: N/A;   SPACE OAR INSTILLATION N/A 03/17/2020   Procedure: SPACE OAR INSTILLATION;  Surgeon: [name], MD;  Location: [facility];  Service: Urology;  Laterality: N/A;   TRANSCATHETER AORTIC VALVE REPLACEMENT, CAROTID Left 10/13/2021   Procedure: Transcatheter Aortic Valve Replacement-Carotid;  Surgeon: Wendel Lurena POUR, MD;  Location: [facility] OR;  Service: Open Heart Surgery;  Laterality: Left;   ULTRASOUND GUIDANCE FOR VASCULAR ACCESS Right 10/13/2021   Procedure: ULTRASOUND GUIDANCE FOR VASCULAR ACCESS;  Surgeon: Wendel Lurena POUR, MD;  Location[facility] OR;  Service: Open Heart Surgery;  Laterality: Right;    Current Medications: Current Meds  Medication Sig   acetaminophen  (TYLENOL ) 325 MG tablet Take 650 mg by mouth every 6 (six) hours as needed for moderate pain.   albuterol  (VENTOLIN  HFA) 108 (90 Base) MCG/ACT inhaler Inhale 1-2 puffs into the lungs every 6 (six) hours as needed for wheezing or shortness of breath.   aspirin  81 MG chewable tablet Chew 81 mg by mouth daily.   atorvastatin  (LIPITOR ) 80 MG tablet TAKE 1 TABLET BY MOUTH EVERY DAY   Bempedoic Acid-Ezetimibe  (NEXLIZET ) 180-10 MG TABS Take 1 tablet by mouth daily. Pt. Will need to make an appointment  in order to receive future refills. First attempt.   fenofibrate  (TRICOR ) 145 MG tablet TAKE 1 TABLET BY MOUTH EVERY DAY   fluticasone  (FLONASE ) 50 MCG/ACT nasal spray Place 1 spray into both nostrils daily.   fluticasone -salmeterol (WIXELA INHUB) 100-50 MCG/ACT AEPB Inhale 1 puff into the lungs  2 (two) times daily.   furosemide  (LASIX ) 20 MG tablet TAKE 1 TABLET BY MOUTH EVERY DAY   levocetirizine (XYZAL) 5 MG tablet Take 5 mg by mouth every evening.   metFORMIN  (GLUCOPHAGE ) 500 MG tablet Take 500 mg by mouth daily with breakfast.   metoprolol  tartrate (LOPRESSOR ) 50 MG tablet Take 50 mg by mouth 2 (two) times daily.   montelukast (SINGULAIR) 10 MG tablet Take 10 mg by mouth daily.   nitroGLYCERIN  (NITROSTAT ) 0.4 MG SL tablet Place 1 tablet (0.4 mg total) under the tongue every 5 (five) minutes as needed for chest pain.   traMADol  (ULTRAM ) 50 MG tablet Take 1-2 tablets (50-100 mg total) by mouth every 6 (six) hours as needed (pain).   VASCEPA  1 g capsule Take 2 capsules (2 g total) by mouth 2 (two) times daily.     Allergies:   Lidocaine  and  Promethazine  hcl   Social History   Socioeconomic History   Marital status: Married    Spouse name: Not on file   Number of children: Not on file   Years of education: Not on file   Highest education level: Not on file  Occupational History    Comment: full time  Tobacco Use   Smoking status: Never   Smokeless tobacco: Never  Vaping Use   Vaping status: Never Used  Substance and Sexual Activity   Alcohol use: No   Drug use: No   Sexual activity: Yes  Other Topics Concern   Not on file  Social History Narrative   Not on file   Social Drivers of Health   Financial Resource Strain: Not on file  Food Insecurity: Not on file  Transportation Needs: Not on file  Physical Activity: Not on file  Stress: Not on file  Social Connections: Not on file     Family History: The patient's family history includes Alzheimer's disease in his mother; Cancer in his father; Cancer - Lung in his father; Heart attack in his maternal grandfather; Skin cancer in his mother; Stroke in his mother. There is no history of Breast cancer, Colon cancer, Prostate cancer, or Pancreatic cancer.  ROS:   Please see the history of present illness.    ROS  All other systems reviewed and negative.   EKGs/Labs/Other Studies Reviewed:    The following studies were reviewed today: 2D echo 09/2022, outside labs from [facility]   Recent Labs: 11/12/2022: BUN 26; Creatinine, Ser 1.10; Hemoglobin 12.2; Platelets 302; Potassium 3.8; Sodium 137   Recent Lipid Panel    Component Value Date/Time   CHOL 96 (L) 08/25/2021 0718   TRIG 243 (H) 08/25/2021 0718   HDL 6 (L) 08/25/2021 0718   CHOLHDL 16.0 (H) 08/25/2021 0718   CHOLHDL 3.6 05/03/2016 0909   VLDL 18 05/03/2016 0909   LDLCALC 51 08/25/2021 0718    Physical Exam:    VS:  BP 130/68 (BP Location: Right Arm, Patient Position: Sitting, Cuff Size: Large)   Pulse (!) 58   Ht 5' 7 (1.702 m)   Wt 156 lb 12.8 oz (71.1 kg)   SpO2 98%   BMI 24.56 kg/m     Wt  Readings from Last 3 Encounters:  05/26/23 156 lb 12.8 oz (71.1 kg)  04/19/23 158 lb (71.7 kg)  11/29/22 149 lb 14.6 oz (68 kg)    GEN: Well nourished, well developed in no acute distress HEENT: Normal NECK: No JVD; No carotid bruits LYMPHATICS: No lymphadenopathy CARDIAC:RRR, no murmurs, rubs, gallops RESPIRATORY:  Clear to auscultation without rales, wheezing or rhonchi  ABDOMEN: Soft, non-tender, non-distended MUSCULOSKELETAL:  No edema; No deformity  SKIN: Warm and dry NEUROLOGIC:  Alert and oriented x 3 PSYCHIATRIC:  Normal affect  ASSESSMENT:    1. Coronary artery disease involving native coronary artery of native heart without angina pectoris   2. Hyperlipidemia associated with type 2 diabetes mellitus (HCC)   3. Nonrheumatic aortic valve stenosis   4. Chronic diastolic heart failure (HCC)     PLAN:    In order of problems listed above:  1.  ASCAD -cath 2015  critical left main stenosis and multivessel ASCAD with normal LVF.  -s/p CABG with LIMA to LAD, SVG to OM, SVG to distal RCA/PL. -Cath 09/2021 showed patent LIMA to LAD and vein graft to second obtuse marginal.  The sequential vein graft to the right coronary artery and R PLV branch has serial high-grade stenoses. Given the fact the patient had no angina medical therapy was recommended -He has not had any chest pain or shortness of breath since I saw him last -Continue prescription drug management with aspirin  81 mg daily, Lopressor  50 mg twice daily, Vascepa  2 g twice daily, Tricor  145 mg daily, atorvastatin  80 mg daily, Nexium was at 180/10 mg daily with PRN Refills  2.  HLD -LDL goal < 50 -I have personally reviewed and interpreted outside labs performed by patient's PCP which showed LDL 69 and HDL 18 on 09/06/2022 and ALT 78 08/2022 -repeat FLP and ALT -Continue prescription drug management with atorvastatin  80 mg daily, Nexlizet  180-10mg  daily, Tricor  145 mg daily and Vascepa  2 g twice daily  3.  Nonrheumatic  aortic stenosis -s/p TAVR on 10/13/2021 with 23 mm Celestia MOLDER are via left carotid artery approach -Follow-up 2D echo 11/13/2021 showed mean aortic valve gradient 10.5 mmHg, DVI 0.46 and V-max 2.28 m/s. -He is followed in structural heart clinic -Repeat echo 10/04/2022 with EF 60 to 65% mild RV dysfunction with normal PASP, stable 23 mm Edwards SAPIEN TAVR with mean aortic valve gradient 13 mmHg with mild PVL with 2 jets 1 perivalvular 1 transvalvular -Continue aspirin  81 mg daily  4.  Chronic diastolic CHF -He does not volume overloaded on exam today -Continue prescription drug management with Lasix  20 mg daily with as needed refills -I have personally reviewed and interpreted outside labs performed by patient's PCP which showed SCr 1.1 on 11/12/2022  Followup with me in 1 year  Medication Adjustments/Labs and Tests Ordered: Current medicines are reviewed at length with the patient today.  Concerns regarding medicines are outlined above.  Orders Placed This Encounter  Procedures   EKG 12-Lead   No orders of the defined types were placed in this encounter.   Signed, Wilbert Bihari, MD  05/26/2023 8:27 AM    [facility]

## 2023-05-26 ENCOUNTER — Ambulatory Visit: Payer: Medicare Other | Attending: Cardiology | Admitting: Cardiology

## 2023-05-26 VITALS — BP 130/68 | HR 58 | Ht 67.0 in | Wt 156.8 lb

## 2023-05-26 DIAGNOSIS — E785 Hyperlipidemia, unspecified: Secondary | ICD-10-CM | POA: Diagnosis not present

## 2023-05-26 DIAGNOSIS — I5032 Chronic diastolic (congestive) heart failure: Secondary | ICD-10-CM | POA: Diagnosis not present

## 2023-05-26 DIAGNOSIS — E1169 Type 2 diabetes mellitus with other specified complication: Secondary | ICD-10-CM

## 2023-05-26 DIAGNOSIS — I2510 Atherosclerotic heart disease of native coronary artery without angina pectoris: Secondary | ICD-10-CM

## 2023-05-26 DIAGNOSIS — I350 Nonrheumatic aortic (valve) stenosis: Secondary | ICD-10-CM | POA: Diagnosis not present

## 2023-05-26 NOTE — Patient Instructions (Signed)
 Medication Instructions:  Your physician recommends that you continue on your current medications as directed. Please refer to the Current Medication list given to you today.  *If you need a refill on your cardiac medications before your next appointment, please call your pharmacy*  Lab Work: To be completed in 1 week: FASTING lipid panel and CMP  If you have labs (blood work) drawn today and your tests are completely normal, you will receive your results only by: MyChart Message (if you have MyChart) OR A paper copy in the mail If you have any lab test that is abnormal or we need to change your treatment, we will call you to review the results.  Testing/Procedures: None ordered today.  Follow-Up: At [facility], you and your health needs are our priority.  As part of our continuing mission to provide you with exceptional heart care, we have created designated Provider Care Teams.  These Care Teams include your primary Cardiologist (physician) and Advanced Practice Providers (APPs -  Physician Assistants and Nurse Practitioners) who all work together to provide you with the care you need, when you need it.   Your next appointment:   1 year(s)  The format for your next appointment:   In Person  Provider:   Wilbert Bihari, MD {

## 2023-06-02 DIAGNOSIS — E1169 Type 2 diabetes mellitus with other specified complication: Secondary | ICD-10-CM | POA: Diagnosis not present

## 2023-06-02 DIAGNOSIS — E785 Hyperlipidemia, unspecified: Secondary | ICD-10-CM | POA: Diagnosis not present

## 2023-06-02 LAB — COMPREHENSIVE METABOLIC PANEL
ALT: 44 [IU]/L (ref 0–44)
AST: 46 [IU]/L — ABNORMAL HIGH (ref 0–40)
Albumin: 4.1 g/dL (ref 3.8–4.8)
Alkaline Phosphatase: 69 [IU]/L (ref 44–121)
BUN/Creatinine Ratio: 18 (ref 10–24)
BUN: 25 mg/dL (ref 8–27)
Bilirubin Total: 0.4 mg/dL (ref 0.0–1.2)
CO2: 25 mmol/L (ref 20–29)
Calcium: 10.3 mg/dL — ABNORMAL HIGH (ref 8.6–10.2)
Chloride: 105 mmol/L (ref 96–106)
Creatinine, Ser: 1.39 mg/dL — ABNORMAL HIGH (ref 0.76–1.27)
Globulin, Total: 2.3 g/dL (ref 1.5–4.5)
Glucose: 135 mg/dL — ABNORMAL HIGH (ref 70–99)
Potassium: 5 mmol/L (ref 3.5–5.2)
Sodium: 141 mmol/L (ref 134–144)
Total Protein: 6.4 g/dL (ref 6.0–8.5)
eGFR: 54 mL/min/{1.73_m2} — ABNORMAL LOW (ref >59)

## 2023-06-02 LAB — LIPID PANEL
Chol/HDL Ratio: 8.3 {ratio} — ABNORMAL HIGH (ref 0.0–5.0)
Cholesterol, Total: 174 mg/dL (ref 100–199)
HDL: 21 mg/dL — ABNORMAL LOW (ref >39)
LDL Chol Calc (NIH): 128 mg/dL — ABNORMAL HIGH (ref 0–99)
Triglycerides: 134 mg/dL (ref 0–149)
VLDL Cholesterol Cal: 25 mg/dL (ref 5–40)

## 2023-06-03 ENCOUNTER — Telehealth: Payer: Self-pay | Admitting: Cardiology

## 2023-06-03 NOTE — Telephone Encounter (Signed)
  Pt c/o medication issue:  1. Name of Medication: VASCEPA 1 g capsule   2. How are you currently taking this medication (dosage and times per day)?   Take 2 capsules (2 g total) by mouth 2 (two) times daily.    3. Are you having a reaction (difficulty breathing--STAT)? No   4. What is your medication issue? Pt said, when he tired to pick up this medication it cost $300+ for 90 days supply, he said, it might be time to re-enroll in his grant. He still have 30 days worth of medication as of the moment

## 2023-06-06 ENCOUNTER — Telehealth: Payer: Self-pay

## 2023-06-06 NOTE — Telephone Encounter (Signed)
Patient Advocate Encounter   The patient was approved for a [employer] grant that will help cover the cost of NEXLIZET AND VASCEPA Total amount awarded, $2500.  Effective: 05/14/23 - 05/12/24   [id number] [id number]   Pharmacy provided with approval and processing information. Patient informed via Dorcas Carrow, CPhT  Pharmacy Patient Advocate Specialist  Direct Number: [phone] Fax: [phone]

## 2023-06-06 NOTE — Telephone Encounter (Signed)
Patient's grant has been renewed. Faxed billing info and instructions to pharmacy. Patient made aware of approval via mychart.

## 2023-06-09 ENCOUNTER — Telehealth: Payer: Self-pay

## 2023-06-09 DIAGNOSIS — E7800 Pure hypercholesterolemia, unspecified: Secondary | ICD-10-CM

## 2023-06-09 NOTE — Telephone Encounter (Signed)
Call to patient to discuss LDL not at goal despite being on multiple antihyperlipidemic meds. Patient states he has not missed any doses of his cholesterol meds.  SCr mildly elevated - forwarded to PCP to review along with mildly elevated Ca.  Patient verifies he is taking lasix every day as ordered. He also states he had a kidney stone last fall and sees Dr. Laverle Patter at [facility] for follow up. Patient responses forwarded to Dr. Mayford Knife.

## 2023-06-09 NOTE — Telephone Encounter (Signed)
-----   Message from Armanda Magic sent at 06/02/2023  9:30 PM EST ----- LDL not at goal on multiple antihyperlipidemic meds>>>please find out if he has missed any doses of his cholesterol meds.  SCr mildly elevated - forward to PCP to review along with mildly elevated Ca.  Is he taking Lasix every day or just PRN

## 2023-06-10 NOTE — Telephone Encounter (Signed)
Call to patient to discuss Dr. Norris Cross recommendations to refer him to lipid clinic. No answer, left detailed message per DPR explaining what lipid clinic is and how it can benefit him. Advised referral would be placed and someone would call to set up an appt. Asked patient to call our office if any questions.

## 2023-06-10 NOTE — Addendum Note (Signed)
Addended by: Luellen Pucker on: 06/10/2023 02:37 PM   Modules accepted: Orders

## 2023-06-11 ENCOUNTER — Other Ambulatory Visit: Payer: Self-pay | Admitting: Cardiology

## 2023-06-13 DIAGNOSIS — M65341 Trigger finger, right ring finger: Secondary | ICD-10-CM | POA: Diagnosis not present

## 2023-06-28 DIAGNOSIS — M65341 Trigger finger, right ring finger: Secondary | ICD-10-CM | POA: Diagnosis not present

## 2023-07-14 ENCOUNTER — Other Ambulatory Visit (HOSPITAL_COMMUNITY): Payer: Self-pay | Admitting: Urology

## 2023-07-14 DIAGNOSIS — N1330 Unspecified hydronephrosis: Secondary | ICD-10-CM

## 2023-07-28 ENCOUNTER — Telehealth: Payer: Self-pay | Admitting: Pharmacy Technician

## 2023-07-28 ENCOUNTER — Encounter: Payer: Self-pay | Admitting: Pharmacist

## 2023-07-28 ENCOUNTER — Telehealth: Payer: Self-pay | Admitting: Pharmacist

## 2023-07-28 ENCOUNTER — Encounter: Payer: Self-pay | Admitting: Cardiology

## 2023-07-28 ENCOUNTER — Ambulatory Visit: Payer: Medicare Other | Attending: Cardiovascular Disease | Admitting: Pharmacist

## 2023-07-28 ENCOUNTER — Other Ambulatory Visit (HOSPITAL_COMMUNITY): Payer: Self-pay

## 2023-07-28 DIAGNOSIS — E7849 Other hyperlipidemia: Secondary | ICD-10-CM | POA: Diagnosis not present

## 2023-07-28 DIAGNOSIS — E785 Hyperlipidemia, unspecified: Secondary | ICD-10-CM

## 2023-07-28 NOTE — Assessment & Plan Note (Signed)
 Assessment and plan:  LDL goal: < 55 mg/dl last LDLc 161 mg/dl (Jan 0960), Historically LDL always below 100. Last lab LDL 128. Per patient he takes all 4 cholesterol medications same as before. But the fill hx indicates last fill for Nexlizet was 11/2022 for 30 days. Patient will check when he go home and get back to [place]. To eliminate possibility for lab error will get updated lab tomorrow or early next week before optimizing therapy further.   Reviewed PCSK-9 inhibitors.  Discussed mechanisms of action, dosing, side effects and potential decreases in LDL cholesterol.  Also reviewed cost information and potential options for patient assistance.  Will apply for PA for PCSK9i meanwhile.

## 2023-07-28 NOTE — Progress Notes (Signed)
 Patient ID: Melvin Dunn.                 DOB: 09-02-1950                    MRN: [id number]      HPI: Melvin Dunn. is a 73 y.o. male patient referred to lipid clinic by Dr.[name]. PMH is significant for CAD S/p CABG X 4 s/p TAVR (10/13/2021), T2DM,   He was referred to Dr. Lynnette Caffey and underwent cardiac catheterization revealing 70% proximal graft 1 lesion before RPDA followed by 80% proximal graft to lesion before RPDA and 60% distal graft lesion before RPDA, 80% mid LM, occluded mid LAD, 70% proximal LAD, occluded distal RCA and occluded ostial left circumflex with a patent LIMA to the LAD and SVG to the OM 2  outside labs performed by patient's PCP which showed LDL 69 and HDL 18 on 09/06/2022 and ALT 78 08/2022 but recent lipid lab LDLc 128 and TG 134   Patient presented today for  lipid clinic. Historically LDL always below 100. Last lab LDL 128. Per patient he takes all 4 cholesterol medications same as before. But the fill hx indicates last fill for Nexlizet was 11/2022 for 30 days. Patient will check when he go home and get back to [place]. To eliminate possibility for lab error will get updated lab tomorrow or early next week before optimizing therapy further.   Reviewed PCSK-9 inhibitors.  Discussed mechanisms of action, dosing, side effects and potential decreases in LDL cholesterol.  Also reviewed cost information and potential options for patient assistance.  Current Medications: Vascepa 2 gm twice daily,Nexlizet 180/10 mg daily, fenofibrate 145 mg daily, Lipitor 80 mg daily   Intolerances: none  Risk Factors: CAD S/p CABG X 4 s/p TAVR (10/13/2021), T2DM,   LDL goal: <55 mg/dl   Diet:  Breakfast : Frozen waffles with peanut butter or egg white with [place] sausage   Lunch: grilled chicken sandwich  Dinner: grilled or backed chicken or fish with vegetables  Snack: sea salt almond Drink: zero pepsi or water  Exercise:  Walks a lot at work but does not follow any exercise regimen   Family History:   Relation Problem Comments  Mother (Deceased) Alzheimer's disease   Skin cancer   Stroke age late 49's      Father (Deceased) Cancer asbestos  Cancer - Lung     Brother (Alive)   Maternal Grandmother (Deceased)   Maternal Grandfather (Deceased) Heart attack age early 71's      Paternal Grandmother (Deceased)   Paternal Grandfather (Deceased)     Social History:  Alcohol: none  Labs:  Lipid Panel     Component Value Date/Time   CHOL 174 06/02/2023 0806   TRIG 134 06/02/2023 0806   HDL 21 (L) 06/02/2023 0806   CHOLHDL 8.3 (H) 06/02/2023 0806   CHOLHDL 3.6 05/03/2016 0909   VLDL 18 05/03/2016 0909   LDLCALC 128 (H) 06/02/2023 0806   LABVLDL 25 06/02/2023 0806    Past Medical History:  Diagnosis Date   Asthma    current inhaler use   CAD (coronary artery disease), native coronary artery    LIMA to LAD, SVG to OM1, SVG to RCA-RPL, EVH via right thigh and leg   CHF (congestive heart failure) (HCC)    Colon polyp    Diabetes mellitus without complication (HCC)    Environmental allergies    History of kidney stones  Hyperlipidemia    Hypertension    Prostate cancer (HCC)    radiation seeds no CA now   S/P TAVR (transcatheter aortic valve replacement) 10/13/2021   s/p TAVR with a 23mm Edwards S3UR via the left carotid approach by Dr. Lynnette Caffey & Dr. Laneta Simmers   Seizure [facility])    due to lidocaine allergy with prostate biopsy this year   Severe aortic stenosis     Current Outpatient Medications on File Prior to Visit  Medication Sig Dispense Refill   fluticasone-salmeterol (WIXELA INHUB) 100-50 MCG/ACT AEPB Inhale 1 puff into the lungs 2 (two) times daily.     acetaminophen (TYLENOL) 325 MG tablet Take 650 mg by mouth every 6 (six) hours as needed for moderate pain.     albuterol (VENTOLIN HFA) 108 (90 Base) MCG/ACT inhaler Inhale 1-2 puffs into the lungs every 6 (six) hours as needed for wheezing or shortness of breath. 8 g 1   aspirin 81 MG chewable  tablet Chew 81 mg by mouth daily.     atorvastatin (LIPITOR) 80 MG tablet TAKE 1 TABLET BY MOUTH EVERY DAY 90 tablet 3   Bempedoic Acid-Ezetimibe (NEXLIZET) 180-10 MG TABS Take 1 tablet by mouth daily. Pt. Will need to make an appointment  in order to receive future refills. First attempt. 30 tablet 0   fenofibrate (TRICOR) 145 MG tablet TAKE 1 TABLET BY MOUTH EVERY DAY 90 tablet 2   furosemide (LASIX) 20 MG tablet TAKE 1 TABLET BY MOUTH EVERY DAY 90 tablet 3   levocetirizine (XYZAL) 5 MG tablet Take 5 mg by mouth every evening.     metFORMIN (GLUCOPHAGE) 500 MG tablet Take 500 mg by mouth daily with breakfast.     metoprolol tartrate (LOPRESSOR) 50 MG tablet Take 50 mg by mouth 2 (two) times daily.     montelukast (SINGULAIR) 10 MG tablet Take 10 mg by mouth daily.     nitroGLYCERIN (NITROSTAT) 0.4 MG SL tablet Place 1 tablet (0.4 mg total) under the tongue every 5 (five) minutes as needed for chest pain. 25 tablet 3   VASCEPA 1 g capsule Take 2 capsules (2 g total) by mouth 2 (two) times daily. 360 capsule 3   Current Facility-Administered Medications on File Prior to Visit  Medication Dose Route Frequency Provider Last Rate Last Admin   sodium phosphate (FLEET) 7-19 GM/118ML enema 1 enema  1 enema Rectal Once Heloise Purpura, MD        Allergies  Allergen Reactions   Lidocaine Other (See Comments)    Seizure with prostate biopsy with lidocaine this year, ? Given too much   Promethazine Hcl     stomach upset    Assessment/Plan:  1. Hyperlipidemia -  Problem  Hyperlipidemia   Hyperlipidemia Assessment and plan:  LDL goal: < 55 mg/dl last LDLc 161 mg/dl (Jan 0960), Historically LDL always below 100. Last lab LDL 128. Per patient he takes all 4 cholesterol medications same as before. But the fill hx indicates last fill for Nexlizet was 11/2022 for 30 days. Patient will check when he go home and get back to [place]. To eliminate possibility for lab error will get updated lab tomorrow or  early next week before optimizing therapy further.   Reviewed PCSK-9 inhibitors.  Discussed mechanisms of action, dosing, side effects and potential decreases in LDL cholesterol.  Also reviewed cost information and potential options for patient assistance.  Will apply for PA for PCSK9i meanwhile.     Thank you,  Carmela Hurt,  Pharm.D [facility] [address], [place], [place] [address]  Phone: [phone]; Fax: [phone]

## 2023-07-28 NOTE — Telephone Encounter (Signed)
 Pharmacy Patient Advocate Encounter   Received notification from Pt Calls Messages that prior authorization for REPATHA is required/requested.   Insurance verification completed.   The patient is insured through [employer] .   Per test claim: PA required; PA submitted to above mentioned insurance via CoverMyMeds Key/confirmation #/EOC [id number] Status is pending

## 2023-07-29 ENCOUNTER — Other Ambulatory Visit (HOSPITAL_COMMUNITY): Payer: Self-pay

## 2023-07-29 ENCOUNTER — Ambulatory Visit (HOSPITAL_COMMUNITY)
Admission: RE | Admit: 2023-07-29 | Discharge: 2023-07-29 | Disposition: A | Discharge status: Home / Self Care | Payer: Medicare Other | Source: Ambulatory Visit | Attending: Urology | Admitting: Urology

## 2023-07-29 DIAGNOSIS — N1330 Unspecified hydronephrosis: Secondary | ICD-10-CM | POA: Diagnosis not present

## 2023-07-29 DIAGNOSIS — N209 Urinary calculus, unspecified: Secondary | ICD-10-CM | POA: Diagnosis not present

## 2023-07-29 MED ORDER — FUROSEMIDE 10 MG/ML IJ SOLN
INTRAMUSCULAR | Status: AC
  Filled 2023-07-29: qty 4

## 2023-07-29 MED ORDER — FUROSEMIDE 10 MG/ML IJ SOLN
35.0000 mg | Freq: Once | INTRAMUSCULAR | Status: AC
  Administered 2023-07-29: 35 mg via INTRAVENOUS

## 2023-07-29 MED ORDER — TECHNETIUM TC 99M MERTIATIDE
5.4000 | Freq: Once | INTRAVENOUS | Status: AC
  Administered 2023-07-29: 5.4 via INTRAVENOUS

## 2023-07-29 NOTE — Telephone Encounter (Signed)
 Pharmacy Patient Advocate Encounter  Received notification from [facility] that Prior Authorization for repatha has been APPROVED from 07/28/23 to 01/28/24. Ran test claim, Copay is $47.00. This test claim was processed through [facility]- copay amounts may vary at other pharmacies due to pharmacy/plan contracts, or as the patient moves through the different stages of their insurance plan.   PA #/Case ID/Reference #: [id number]

## 2023-08-01 MED ORDER — NEXLIZET 180-10 MG PO TABS: 1.0000 | ORAL_TABLET | Freq: Every day | ORAL | 11 refills | Status: DC

## 2023-08-01 NOTE — Telephone Encounter (Signed)
 Spoke to pt, he was off of Nexlizet, want to go back on Nexlizet get the updated lab in 2 months as it worked well in the past for him. if LDL remains above the goal he will go on Repatha.

## 2023-08-03 NOTE — Telephone Encounter (Signed)
 Spoke to pt, he was off of Nexlizet, want to go back on Nexlizet get the updated lab in 2 months as it worked well in the past for him. if LDL remains above the goal he will go on Repatha.

## 2023-08-17 DIAGNOSIS — R3914 Feeling of incomplete bladder emptying: Secondary | ICD-10-CM | POA: Diagnosis not present

## 2023-08-17 DIAGNOSIS — N131 Hydronephrosis with ureteral stricture, not elsewhere classified: Secondary | ICD-10-CM | POA: Diagnosis not present

## 2023-08-31 ENCOUNTER — Other Ambulatory Visit: Payer: Self-pay | Admitting: Physician Assistant

## 2023-08-31 ENCOUNTER — Other Ambulatory Visit: Payer: Self-pay | Admitting: Cardiology

## 2023-09-16 DIAGNOSIS — E7800 Pure hypercholesterolemia, unspecified: Secondary | ICD-10-CM | POA: Diagnosis not present

## 2023-09-16 DIAGNOSIS — I5032 Chronic diastolic (congestive) heart failure: Secondary | ICD-10-CM | POA: Diagnosis not present

## 2023-09-16 DIAGNOSIS — Z889 Allergy status to unspecified drugs, medicaments and biological substances status: Secondary | ICD-10-CM | POA: Diagnosis not present

## 2023-09-16 DIAGNOSIS — J4520 Mild intermittent asthma, uncomplicated: Secondary | ICD-10-CM | POA: Diagnosis not present

## 2023-09-16 DIAGNOSIS — E1169 Type 2 diabetes mellitus with other specified complication: Secondary | ICD-10-CM | POA: Diagnosis not present

## 2023-09-16 DIAGNOSIS — Z0000 Encounter for general adult medical examination without abnormal findings: Secondary | ICD-10-CM | POA: Diagnosis not present

## 2023-09-16 DIAGNOSIS — D508 Other iron deficiency anemias: Secondary | ICD-10-CM | POA: Diagnosis not present

## 2023-09-16 LAB — LAB REPORT - SCANNED
A1c: 6.3
Albumin, Urine POC: 0.7
Creatinine, POC: 77 mg/dL
EGFR: 60
Microalb Creat Ratio: 9.1
TSH: 6.46 — AB (ref 0.41–5.90)

## 2023-10-12 ENCOUNTER — Encounter: Payer: Self-pay | Admitting: Cardiology

## 2023-10-13 DIAGNOSIS — E785 Hyperlipidemia, unspecified: Secondary | ICD-10-CM | POA: Diagnosis not present

## 2023-10-14 ENCOUNTER — Ambulatory Visit: Payer: Self-pay | Admitting: Pharmacist

## 2023-10-14 LAB — LIPID PANEL
Chol/HDL Ratio: 17 ratio — ABNORMAL HIGH (ref 0.0–5.0)
Cholesterol, Total: 119 mg/dL (ref 100–199)
HDL: 7 mg/dL — ABNORMAL LOW (ref >39)
LDL Chol Calc (NIH): 69 mg/dL (ref 0–99)
Triglycerides: 259 mg/dL — ABNORMAL HIGH (ref 0–149)
VLDL Cholesterol Cal: 43 mg/dL — ABNORMAL HIGH (ref 5–40)

## 2023-10-14 NOTE — Telephone Encounter (Signed)
 Results briefly discussed with the patient over the phone. LDL and triglyceride levels remain above target. Blood glucose also needs better control. Follow-up office visit is scheduled for June 2 at 2:30 PM for further evaluation and management.

## 2023-10-17 ENCOUNTER — Encounter: Payer: Self-pay | Admitting: Pharmacist

## 2023-10-17 ENCOUNTER — Other Ambulatory Visit (HOSPITAL_COMMUNITY): Payer: Self-pay

## 2023-10-17 ENCOUNTER — Telehealth: Payer: Self-pay | Admitting: Pharmacist

## 2023-10-17 ENCOUNTER — Ambulatory Visit: Attending: Cardiovascular Disease | Admitting: Pharmacist

## 2023-10-17 DIAGNOSIS — E7849 Other hyperlipidemia: Secondary | ICD-10-CM | POA: Diagnosis not present

## 2023-10-17 DIAGNOSIS — E119 Type 2 diabetes mellitus without complications: Secondary | ICD-10-CM | POA: Diagnosis not present

## 2023-10-17 MED ORDER — METFORMIN HCL 500 MG PO TABS
1000.0000 mg | ORAL_TABLET | Freq: Two times a day (BID) | ORAL | 12 refills | Status: DC
  Filled 2023-10-17: qty 120, 30d supply, fill #0

## 2023-10-17 NOTE — Assessment & Plan Note (Signed)
 Assessment and Plan:   LDL goal: <55 mg/dl TG <829 mg/dl last LDLc 69 mg/dl TG 562 mg/dl (13/0865) while on Vascepa  2 gm twice daily,Nexlizet  180/10 mg daily, fenofibrate  145 mg daily, Lipitor  80 mg daily   Tolerates current meds well without side effects  The importance of lifestyle modifications, including regular exercise and dietary changes, was discussed in detail, and the patient expressed willingness to implement these changes.  reviewed PCSK9 inhibitors thoroughly, covering their mechanisms of action, dosing schedules, side effects, and expected LDL cholesterol reductions. patient agreed to switch from Nexlizet  to PCSK9 inhibitor therapy for better LDL control Continue taking current medications Will apply for PA for PCSK9i; will inform patient upon approval Lipid lab due in 2-3 months after starting PCSK9i

## 2023-10-17 NOTE — Assessment & Plan Note (Signed)
 Assessment and Plan:  The patient's last A1c was 6.4% (May 2024), nearly one year ago; an updated A1c will be obtained today. He is currently on a subtherapeutic dose of metformin (500 mg once daily). Plan is to gradually titrate metformin to 1000 mg twice daily. If A1c remains above goal despite optimized metformin dosing, initiation of an SGLT2 inhibitor will be considered for further glycemic control.

## 2023-10-17 NOTE — Progress Notes (Signed)
 Patient ID: Melvin Dunn.                 DOB: Oct 16, 1950                    MRN: [id number]      HPI: Melvin Dunn. is a 73 y.o. male patient referred to lipid clinic by Dr.[name]. PMH is significant for CAD S/p CABG X 4 s/p TAVR (10/13/2021), T2DM,   He was referred to Dr. Lorie Rook and underwent cardiac catheterization revealing 70% proximal graft 1 lesion before RPDA followed by 80% proximal graft to lesion before RPDA and 60% distal graft lesion before RPDA, 80% mid LM, occluded mid LAD, 70% proximal LAD, occluded distal RCA and occluded ostial left circumflex with a patent LIMA to the LAD and SVG to the OM 2  outside labs performed by patient's PCP which showed LDL 69 and HDL 18 on 09/06/2022 and ALT 78 08/2022 but recent lipid lab LDLc 128 and TG 134  PatientVascepa 2 gm twice daily,Nexlizet  180/10 mg daily, fenofibrate  145 mg daily, Lipitor  80 mg daily  TG still elevated and LDL still above the goal   The patient presented today for lipid management follow-up. He reports good medication adherence but does not monitor carbohydrate intake and typically eats out two or more times per week. He does not check blood glucose at home and takes only one metformin dose with breakfast. His last A1c was under 7, but it was nearly a year ago; an updated A1c was ordered today. The importance of lifestyle modifications, including regular exercise and dietary changes, was discussed in detail, and the patient expressed willingness to implement these changes. We reviewed PCSK9 inhibitors thoroughly, covering their mechanisms of action, dosing schedules, side effects, and expected LDL cholesterol reductions. Cost and patient assistance programs were also discussed. The patient agreed to switch from Nexlizet  to PCSK9 inhibitor therapy for better LDL control.   Current Medications: Vascepa  2 gm twice daily,Nexlizet  180/10 mg daily, fenofibrate  145 mg daily, Lipitor  80 mg daily   Intolerances: none  Risk Factors:  CAD S/p CABG X 4 s/p TAVR (10/13/2021), T2DM,   LDL goal: <55 mg/dl  Last lab TC 409, TG 811, HDL 7, LDL 69 -09/2023 Diet:  Breakfast : Frozen waffles with peanut butter or egg white with [place] sausage   Lunch: grilled chicken sandwich  Dinner: grilled or backed chicken or fish with vegetables  Snack: sea salt almond Drink: zero pepsi or water   Exercise:  Walks a lot at work but does not follow any exercise regimen  Family History:   Relation Problem Comments  Mother (Deceased) Alzheimer's disease   Skin cancer   Stroke age late 2's      Father (Deceased) Cancer asbestos  Cancer - Lung     Brother (Alive)   Maternal Grandmother (Deceased)   Maternal Grandfather (Deceased) Heart attack age early 70's      Paternal Grandmother (Deceased)   Paternal Grandfather (Deceased)     Social History:  Alcohol: none  Labs:  Lipid Panel     Component Value Date/Time   CHOL 119 10/13/2023 0813   TRIG 259 (H) 10/13/2023 0813   HDL 7 (L) 10/13/2023 0813   CHOLHDL 17.0 (H) 10/13/2023 0813   CHOLHDL 3.6 05/03/2016 0909   VLDL 18 05/03/2016 0909   LDLCALC 69 10/13/2023 0813   LABVLDL 43 (H) 10/13/2023 0813    Past Medical History:  Diagnosis Date   Asthma  current inhaler use   CAD (coronary artery disease), native coronary artery    LIMA to LAD, SVG to OM1, SVG to RCA-RPL, EVH via right thigh and leg   CHF (congestive heart failure) (HCC)    Colon polyp    Diabetes mellitus without complication (HCC)    Environmental allergies    History of kidney stones    Hyperlipidemia    Hypertension    Prostate cancer (HCC)    radiation seeds no CA now   S/P TAVR (transcatheter aortic valve replacement) 10/13/2021   s/p TAVR with a 23mm Edwards S3UR via the left carotid approach by Dr. Lorie Rook & Dr. Sherene Dilling   Seizure [facility])    due to lidocaine  allergy with prostate biopsy this year   Severe aortic stenosis     Current Outpatient Medications on File Prior to Visit  Medication  Sig Dispense Refill   acetaminophen  (TYLENOL ) 325 MG tablet Take 650 mg by mouth every 6 (six) hours as needed for moderate pain.     albuterol  (VENTOLIN  HFA) 108 (90 Base) MCG/ACT inhaler Inhale 1-2 puffs into the lungs every 6 (six) hours as needed for wheezing or shortness of breath. 8 g 1   aspirin  81 MG chewable tablet Chew 81 mg by mouth daily.     atorvastatin  (LIPITOR ) 80 MG tablet TAKE 1 TABLET BY MOUTH EVERY DAY 90 tablet 3   Bempedoic Acid-Ezetimibe  (NEXLIZET ) 180-10 MG TABS Take 1 tablet by mouth daily. 30 tablet 11   fenofibrate  (TRICOR ) 145 MG tablet TAKE 1 TABLET BY MOUTH EVERY DAY 90 tablet 2   fluticasone -salmeterol (WIXELA INHUB) 100-50 MCG/ACT AEPB Inhale 1 puff into the lungs 2 (two) times daily.     furosemide  (LASIX ) 20 MG tablet TAKE 1 TABLET BY MOUTH EVERY DAY 90 tablet 2   metoprolol  tartrate (LOPRESSOR ) 50 MG tablet Take 50 mg by mouth 2 (two) times daily.     nitroGLYCERIN  (NITROSTAT ) 0.4 MG SL tablet Place 1 tablet (0.4 mg total) under the tongue every 5 (five) minutes as needed for chest pain. 25 tablet 3   VASCEPA  1 g capsule Take 2 capsules (2 g total) by mouth 2 (two) times daily. 360 capsule 2   montelukast (SINGULAIR) 10 MG tablet Take 10 mg by mouth daily.     Current Facility-Administered Medications on File Prior to Visit  Medication Dose Route Frequency Provider Last Rate Last Admin   sodium phosphate  (FLEET) 7-19 GM/118ML enema 1 enema  1 enema Rectal Once Florencio Hunting, MD        Allergies  Allergen Reactions   Lidocaine  Other (See Comments)    Seizure with prostate biopsy with lidocaine  this year, ? Given too much   Promethazine  Hcl     stomach upset    Assessment/Plan:  1. Hyperlipidemia -  Problem  Diabetes Mellitus Without Complication (Hcc)  Hyperlipidemia     Current Medications: Vascepa  2 gm twice daily,Nexlizet  180/10 mg daily, fenofibrate  145 mg daily, Lipitor  80 mg daily   Intolerances: none  Risk Factors: CAD S/p CABG X 4 s/p  TAVR (10/13/2021), T2DM,   LDL goal: <55 mg/dl  Last lab TC 308, TG 657, HDL 7, LDL 69 -09/2023     Hyperlipidemia Assessment and Plan:   LDL goal: <55 mg/dl TG <846 mg/dl last LDLc 69 mg/dl TG 962 mg/dl (95/2841) while on Vascepa  2 gm twice daily,Nexlizet  180/10 mg daily, fenofibrate  145 mg daily, Lipitor  80 mg daily   Tolerates current meds well without side effects  The importance of lifestyle modifications, including regular exercise and dietary changes, was discussed in detail, and the patient expressed willingness to implement these changes.  reviewed PCSK9 inhibitors thoroughly, covering their mechanisms of action, dosing schedules, side effects, and expected LDL cholesterol reductions. patient agreed to switch from Nexlizet  to PCSK9 inhibitor therapy for better LDL control Continue taking current medications Will apply for PA for PCSK9i; will inform patient upon approval Lipid lab due in 2-3 months after starting PCSK9i   Diabetes mellitus without complication (HCC) Assessment and Plan:  The patient's last A1c was 6.4% (May 2024), nearly one year ago; an updated A1c will be obtained today. He is currently on a subtherapeutic dose of metformin (500 mg once daily). Plan is to gradually titrate metformin to 1000 mg twice daily. If A1c remains above goal despite optimized metformin dosing, initiation of an SGLT2 inhibitor will be considered for further glycemic control.     Thank you,  Nickola Baron, Pharm.D [facility] A Division of [facility] [address] [place], [place] [address]  Phone: [phone]; Fax: [phone]

## 2023-10-17 NOTE — Patient Instructions (Addendum)
 Summary of today's visit:   Continue taking Lipitor  80 mg daily , Vascepa  2 gm twice daily and fenofibrate  145 mg daily Implement lifestyle changes that we discussed that will help to lower TG level  We will initiate PA for Repatha. Once we put you on Repatha we will discontinue Nexlizet  and continue with other cholesterol meds and repeat lipid lab due in 2-3 months  Increase dose on metformin from 500 mg daily to 1000 mg twice daily ( slow dose titration as per our discussion) will get updated A1c. If A1c warrants additional BG control we may consider addition of Jardiance or Farxiga to get better BG and TG control .

## 2023-10-18 LAB — HEMOGLOBIN A1C
Est. average glucose Bld gHb Est-mCnc: 114 mg/dL
Hgb A1c MFr Bld: 5.6 % (ref 4.8–5.6)

## 2023-10-20 ENCOUNTER — Ambulatory Visit: Payer: Self-pay | Admitting: Cardiology

## 2023-10-24 ENCOUNTER — Other Ambulatory Visit: Payer: Self-pay

## 2023-10-24 DIAGNOSIS — Z79899 Other long term (current) drug therapy: Secondary | ICD-10-CM

## 2023-10-24 DIAGNOSIS — E7800 Pure hypercholesterolemia, unspecified: Secondary | ICD-10-CM

## 2023-10-24 NOTE — Progress Notes (Signed)
 Lab orders placed.

## 2023-10-27 ENCOUNTER — Encounter: Payer: Self-pay | Admitting: Cardiology

## 2023-10-28 DIAGNOSIS — E7800 Pure hypercholesterolemia, unspecified: Secondary | ICD-10-CM | POA: Diagnosis not present

## 2023-10-28 DIAGNOSIS — Z79899 Other long term (current) drug therapy: Secondary | ICD-10-CM | POA: Diagnosis not present

## 2023-11-01 ENCOUNTER — Ambulatory Visit: Payer: Self-pay | Admitting: Cardiology

## 2023-11-01 DIAGNOSIS — E7800 Pure hypercholesterolemia, unspecified: Secondary | ICD-10-CM

## 2023-11-01 DIAGNOSIS — I2510 Atherosclerotic heart disease of native coronary artery without angina pectoris: Secondary | ICD-10-CM

## 2023-11-02 ENCOUNTER — Telehealth: Payer: Self-pay | Admitting: Pharmacist

## 2023-11-02 NOTE — Telephone Encounter (Signed)
 Contacted patient after receiving message over myChart. Patient would like to see a different PharmD if Dr Micael Adas recommends another follow up.  Patient also reported he was advised to increase his metformin  to 1000mg  BID. Consulted with his PCP and because A1c is 5.6% he is going to remain on 500mg  once daily.

## 2023-11-03 NOTE — Telephone Encounter (Signed)
 Call to patient to advise Lipids still not at goal. Patient agrees to lipid clinic for further recommendations. Order placed.

## 2023-11-15 NOTE — Telephone Encounter (Signed)
  PA for Repatha is on file until 01/2024  $47.00 one month

## 2023-12-21 ENCOUNTER — Ambulatory Visit: Attending: Cardiology | Admitting: Pharmacist

## 2023-12-21 DIAGNOSIS — I2510 Atherosclerotic heart disease of native coronary artery without angina pectoris: Secondary | ICD-10-CM | POA: Diagnosis not present

## 2023-12-21 DIAGNOSIS — E785 Hyperlipidemia, unspecified: Secondary | ICD-10-CM

## 2023-12-21 MED ORDER — REPATHA SURECLICK 140 MG/ML ~~LOC~~ SOAJ
1.0000 mL | SUBCUTANEOUS | 3 refills | Status: AC
Start: 2023-12-21 — End: ?

## 2023-12-21 NOTE — Patient Instructions (Addendum)
 I will submit a prior authorization for Repatha . I will call you once I hear back. Please call me at [phone] with any questions.   Repatha  is a cholesterol medication that improved your body's ability to get rid of bad cholesterol known as LDL. It can lower your LDL up to 60%! It is an injection that is given under the skin every 2 weeks. The medication often requires a prior authorization from your insurance company. We will take care of submitting all the necessary information to your insurance company to get it approved. The most common side effects of Repatha  include runny nose, symptoms of the common cold, rarely flu or flu-like symptoms, back/muscle pain in about 3-4% of the patients, and redness, pain, or bruising at the injection site. Tell your healthcare provider if you have any side effect that bothers you or that does not go away.   STOP Nexlizet  when you start Repatha  Continue atorvastatin , Vascepa  and fenofibrate   Repeat labs in 3 months- please come fasting Please increase your exercise Ideally 150 min of moderate exercise per week

## 2023-12-21 NOTE — Progress Notes (Signed)
 Patient ID: Melvin Dunn.                 DOB: 1950-07-28                    MRN: [id number]      HPI: Melvin Dunn. is a 73 y.o. male patient referred to lipid clinic by Dr.[name]. PMH is significant for CAD S/p CABG X 4 s/p TAVR (10/13/2021), T2DM, prostate cancer.  He was referred to Dr. Wendel and underwent cardiac catheterization revealing 70% proximal graft 1 lesion before RPDA followed by 80% proximal graft to lesion before RPDA and 60% distal graft lesion before RPDA, 80% mid LM, occluded mid LAD, 70% proximal LAD, occluded distal RCA and occluded ostial left circumflex with a patent LIMA to the LAD and SVG to the OM 2   Saw patient previously a couple years ago where we were able to get him on Vascepa  and back on Nexlizet . Patient still taking Vascepa  2 gm twice daily,Nexlizet  180/10 mg daily, fenofibrate  145 mg daily, Lipitor  80 mg daily  TG still elevated and LDL still above the goal.   Patient presents today to lipid clinic.  He is now willing to consider Repatha .  We reviewed injection technique and dosing.  He had started exercising after last visit with pharmacist but read online that he should not exercise while on metformin .  We discussed that this is not accurate and potentially where it came from.  Encouraged him to exercise.  His triglycerides are a tricky situation.  He is on Vascepa  2 g twice a day and fenofibrate  145 mg daily.  We know that high triglycerides put us  at higher tolerated vascular risk however we have very few drugs that have actually lower triglycerides and reduce cardiovascular risk.  He is on Vascepa  which is the one drug that has shown this.  Ironically, there was not much difference in TG levels between placebo group and drug group in this trial.   Current Medications: Vascepa  2 gm twice daily, Nexlizet  180/10 mg daily, fenofibrate  145 mg daily, Lipitor  80 mg daily   Intolerances: none  Risk Factors: CAD S/p CABG X 4 s/p TAVR (10/13/2021), T2DM,    LDL goal: <55 mg/dl  Diet:  Breakfast : Frozen waffles with peanut butter or egg white with [place] sausage   Lunch: grilled chicken sandwich w/ whole wheat bread Dinner: grilled or backed chicken or fish with vegetables  Snack: sea salt almond, cashews, carrots Drink: zero pepsi or water    Exercise:  Walks a lot at work but does not follow any exercise regimen  Family History:   Relation Problem Comments  Mother (Deceased) Alzheimer's disease   Skin cancer   Stroke age late 65's      Father (Deceased) Cancer asbestos  Cancer - Lung     Brother (Alive)   Maternal Grandmother (Deceased)   Maternal Grandfather (Deceased) Heart attack age early 78's      Paternal Grandmother (Deceased)   Paternal Grandfather (Deceased)     Social History:  Alcohol: none  Labs:  Lipid Panel     Component Value Date/Time   CHOL 115 10/28/2023 0809   TRIG 202 (H) 10/28/2023 0809   HDL 9 (L) 10/28/2023 0809   CHOLHDL 12.8 (H) 10/28/2023 0809   CHOLHDL 3.6 05/03/2016 0909   VLDL 18 05/03/2016 0909   LDLCALC 72 10/28/2023 0809   LABVLDL 34 10/28/2023 0809    Past Medical History:  Diagnosis Date   Asthma    current inhaler use   CAD (coronary artery disease), native coronary artery    LIMA to LAD, SVG to OM1, SVG to RCA-RPL, EVH via right thigh and leg   CHF (congestive heart failure) (HCC)    Colon polyp    Diabetes mellitus without complication (HCC)    Environmental allergies    History of kidney stones    Hyperlipidemia    Hypertension    Prostate cancer (HCC)    radiation seeds no CA now   S/P TAVR (transcatheter aortic valve replacement) 10/13/2021   s/p TAVR with a 23mm Edwards S3UR via the left carotid approach by Dr. Wendel & Dr. Lucas   Seizure [facility])    due to lidocaine  allergy with prostate biopsy this year   Severe aortic stenosis     Current Outpatient Medications on File Prior to Visit  Medication Sig Dispense Refill   acetaminophen  (TYLENOL ) 325 MG  tablet Take 650 mg by mouth every 6 (six) hours as needed for moderate pain.     albuterol  (VENTOLIN  HFA) 108 (90 Base) MCG/ACT inhaler Inhale 1-2 puffs into the lungs every 6 (six) hours as needed for wheezing or shortness of breath. 8 g 1   aspirin  81 MG chewable tablet Chew 81 mg by mouth daily.     atorvastatin  (LIPITOR ) 80 MG tablet TAKE 1 TABLET BY MOUTH EVERY DAY 90 tablet 3   fenofibrate  (TRICOR ) 145 MG tablet TAKE 1 TABLET BY MOUTH EVERY DAY 90 tablet 2   fluticasone -salmeterol (WIXELA INHUB) 100-50 MCG/ACT AEPB Inhale 1 puff into the lungs 2 (two) times daily.     furosemide  (LASIX ) 20 MG tablet TAKE 1 TABLET BY MOUTH EVERY DAY 90 tablet 2   metFORMIN  (GLUCOPHAGE ) 500 MG tablet Take 1 tablet (500 mg total) by mouth in the morning.     metoprolol  tartrate (LOPRESSOR ) 50 MG tablet Take 50 mg by mouth 2 (two) times daily.     montelukast (SINGULAIR) 10 MG tablet Take 10 mg by mouth daily.     nitroGLYCERIN  (NITROSTAT ) 0.4 MG SL tablet Place 1 tablet (0.4 mg total) under the tongue every 5 (five) minutes as needed for chest pain. 25 tablet 3   VASCEPA  1 g capsule Take 2 capsules (2 g total) by mouth 2 (two) times daily. 360 capsule 2   Current Facility-Administered Medications on File Prior to Visit  Medication Dose Route Frequency Provider Last Rate Last Admin   sodium phosphate  (FLEET) 7-19 GM/118ML enema 1 enema  1 enema Rectal Once [name], MD        Allergies  Allergen Reactions   Lidocaine  Other (See Comments)    Seizure with prostate biopsy with lidocaine  this year, ? Given too much   Promethazine  Hcl     stomach upset    Assessment/Plan:  1. Hyperlipidemia -  Problem  Hyperlipidemia     Current Medications: Vascepa  2 gm twice daily,Nexlizet  180/10 mg daily, fenofibrate  145 mg daily, Lipitor  80 mg daily   Intolerances: none  Risk Factors: CAD S/p CABG X 4 s/p TAVR (10/13/2021), T2DM,   LDL goal: <55 mg/dl  Last lab TC 880, TG 740, HDL 7, LDL 69 -09/2023       Hyperlipidemia Assessment: TG are chronically elevated. On Vascepa  2 gm twice daily, Nexlizet  180/10 mg daily, fenofibrate  145 mg daily, Lipitor  80 mg daily   Diet reviewed again- discussed changing lunch from sandwich to chicken and vegetables Willing to start Repatha   which will help LDL-C mostly Injection technique reviewed  Plan: Start repatha  140mg  q 14 days Stop Nexlizet  Continue atorvastatin , vascepa  and fenofibrate  Repeat labs in 3 months Increase exercise   Thank you,  Natosha Bou D Oday Ridings, Pharm.JONETTA SARAN, CPP [facility] A Division of [facility] [address], [place], [place] [address]  Phone: [phone]; Fax: [phone]

## 2024-01-01 NOTE — Assessment & Plan Note (Addendum)
 Assessment: TG are chronically elevated. On Vascepa  2 gm twice daily, Nexlizet  180/10 mg daily, fenofibrate  145 mg daily, Lipitor  80 mg daily   Diet reviewed again- discussed changing lunch from sandwich to chicken and vegetables Willing to start Repatha  which will help LDL-C mostly Injection technique reviewed  Plan: Start repatha  140mg  q 14 days Stop Nexlizet  Continue atorvastatin , vascepa  and fenofibrate  Repeat labs in 3 months Increase exercise

## 2024-01-18 ENCOUNTER — Other Ambulatory Visit: Payer: Self-pay | Admitting: Cardiology

## 2024-02-23 ENCOUNTER — Telehealth: Payer: Self-pay | Admitting: Cardiology

## 2024-02-23 NOTE — Telephone Encounter (Signed)
 Forwarding to our Pharmacist

## 2024-02-23 NOTE — Telephone Encounter (Signed)
 Pt c/o medication issue:  1. Name of Medication: metFORMIN  (GLUCOPHAGE ) 500 MG tablet   2. How are you currently taking this medication (dosage and times per day)? As written  3. Are you having a reaction (difficulty breathing--STAT)? No   4. What is your medication issue? Per [facility] needs new script sent to   [id number] - [place], [place] - [address] Phone: [phone]  Fax: [phone]

## 2024-02-24 NOTE — Telephone Encounter (Signed)
 Pavero, Christopher, RPH to [facility] Triage  Davee Comer CROME, RN (Selected Message)     02/24/24 12:08 PM I didn't actually send it or start it. Just added it to his list. Recommend request be sent to PCP Me to Pavero, Christopher, [facility]     02/23/24 11:06 AM It looks like you filled this medication in June. Would you like to refill this medication?  Called the patient and he said he does not need any refills at this time.

## 2024-03-06 DIAGNOSIS — Z952 Presence of prosthetic heart valve: Secondary | ICD-10-CM | POA: Diagnosis not present

## 2024-03-06 DIAGNOSIS — E1169 Type 2 diabetes mellitus with other specified complication: Secondary | ICD-10-CM | POA: Diagnosis not present

## 2024-03-06 DIAGNOSIS — G4700 Insomnia, unspecified: Secondary | ICD-10-CM | POA: Diagnosis not present

## 2024-03-06 DIAGNOSIS — E7800 Pure hypercholesterolemia, unspecified: Secondary | ICD-10-CM | POA: Diagnosis not present

## 2024-03-06 DIAGNOSIS — Z23 Encounter for immunization: Secondary | ICD-10-CM | POA: Diagnosis not present

## 2024-03-06 DIAGNOSIS — J4520 Mild intermittent asthma, uncomplicated: Secondary | ICD-10-CM | POA: Diagnosis not present

## 2024-03-06 DIAGNOSIS — I5032 Chronic diastolic (congestive) heart failure: Secondary | ICD-10-CM | POA: Diagnosis not present

## 2024-03-06 DIAGNOSIS — I2510 Atherosclerotic heart disease of native coronary artery without angina pectoris: Secondary | ICD-10-CM | POA: Diagnosis not present

## 2024-03-08 ENCOUNTER — Telehealth: Payer: Self-pay | Admitting: Family Medicine

## 2024-03-08 DIAGNOSIS — E119 Type 2 diabetes mellitus without complications: Secondary | ICD-10-CM

## 2024-03-08 NOTE — Progress Notes (Signed)
 Pharmacy Quality Measure Review  This patient is appearing on a report for being at risk of failing the adherence measure for diabetes medications this calendar year.   Medication: Metformin  500mg  Last fill date: 06/02 for 30 day supply  Spoke with patient, sees PharmD at cardiologist office, who increased his dose. He did not want this, requested dose decrease, which his cardiologist and PCP approved of.  Med list reflects this, and patient confirmed he has enough supply with new frequency of once daily administration. All patient questions were answered.  Angela Baalmann, PharmD [facility] Pharmacist

## 2024-03-13 ENCOUNTER — Telehealth: Payer: Self-pay | Admitting: Pharmacy Technician

## 2024-03-13 NOTE — Telephone Encounter (Signed)
   Pharmacy Patient Advocate Encounter   Received notification from CoverMyMeds that prior authorization for repatha  is required/requested.   Insurance verification completed.   The patient is insured through [employer].   Per test claim: PA required; PA submitted to above mentioned insurance via Latent Key/confirmation #/EOC [id number] Status is pending

## 2024-03-13 NOTE — Telephone Encounter (Signed)
 Pharmacy Patient Advocate Encounter  Received notification from [facility] that Prior Authorization for repatha  has been APPROVED from 03/13/24 to 05/16/25   PA #/Case ID/Reference #: [id number]

## 2024-04-04 NOTE — Telephone Encounter (Addendum)
     I called patient and he is aware repatha  still good on grant

## 2024-04-10 ENCOUNTER — Other Ambulatory Visit: Payer: Self-pay | Admitting: Cardiology

## 2024-04-11 ENCOUNTER — Encounter: Payer: Self-pay | Admitting: Pharmacist

## 2024-04-11 NOTE — Telephone Encounter (Signed)
 Patient Advocate Encounter   The patient was approved for a [employer] grant that will help cover the cost of repatha  Total amount awarded, 2500.  Effective: 05/13/24 - 05/12/25   APW:[id number] ERW:[id number] Hmnle:[id number] PI:[id number] [employer] ID: [id number]   Pharmacy provided with approval and processing information. Patient informed via mychart

## 2024-04-25 ENCOUNTER — Encounter: Payer: Self-pay | Admitting: Cardiology

## 2024-04-30 ENCOUNTER — Other Ambulatory Visit: Payer: Self-pay | Admitting: Pulmonary Disease

## 2024-04-30 DIAGNOSIS — J449 Chronic obstructive pulmonary disease, unspecified: Secondary | ICD-10-CM

## 2024-05-11 ENCOUNTER — Encounter: Payer: Self-pay | Admitting: Pharmacist

## 2024-05-21 ENCOUNTER — Other Ambulatory Visit: Payer: Self-pay

## 2024-05-21 DIAGNOSIS — E7800 Pure hypercholesterolemia, unspecified: Secondary | ICD-10-CM

## 2024-05-21 DIAGNOSIS — I2510 Atherosclerotic heart disease of native coronary artery without angina pectoris: Secondary | ICD-10-CM

## 2024-05-22 ENCOUNTER — Ambulatory Visit: Payer: Self-pay | Admitting: Pharmacist

## 2024-05-22 LAB — LIPID PANEL
Chol/HDL Ratio: 4.2 ratio (ref 0.0–5.0)
Cholesterol, Total: 96 mg/dL — ABNORMAL LOW (ref 100–199)
HDL: 23 mg/dL — ABNORMAL LOW
LDL Chol Calc (NIH): 47 mg/dL (ref 0–99)
Triglycerides: 149 mg/dL (ref 0–149)
VLDL Cholesterol Cal: 26 mg/dL (ref 5–40)

## 2024-05-22 LAB — LDL CHOLESTEROL, DIRECT: LDL Direct: 32 mg/dL (ref 0–99)

## 2024-05-22 LAB — APOLIPOPROTEIN B: Apolipoprotein B: 64 mg/dL

## 2024-06-02 ENCOUNTER — Other Ambulatory Visit: Payer: Self-pay | Admitting: Pulmonary Disease

## 2024-06-02 DIAGNOSIS — J449 Chronic obstructive pulmonary disease, unspecified: Secondary | ICD-10-CM

## 2024-06-03 ENCOUNTER — Other Ambulatory Visit: Payer: Self-pay | Admitting: Cardiology

## 2024-06-03 NOTE — Progress Notes (Unsigned)
 "  Cardiology Office Note   Date:  06/04/2024  ID:  Melvin Dunn., DOB Sep 07, 1950, MRN [id number] PCP: Melvin Elsie JONELLE, MD  [facility] Providers Cardiologist:  Wilbert Bihari, MD   History of Present Illness Melvin Dunn. is a 74 y.o. male with a past medical history of dyslipidemia, diabetes and CAD with cath revealing critical left main stenosis and multivessel CAD with normal LVEF.  Underwent CABG with LIMA to LAD, SVG to OM, SVG to distal RCA/PL.  Patient also having history of moderate to severe AS with echo 11/2018 showing normal LVEF of 55 to 60% with moderate to severe AS and peak AV velocity of 339 cm/s, mean AVG of 27 mmHg, AVA 0.77 cm and dimensionless index 0.  T9.  He was referred to Dr. Wendel and also underwent cardiac catheterization in 70% proximal graft 1 lesion before the RPDA followed by 80% proximal graft to lesion before the R PDA and 60% distal graft lesion before RPDA, 80% mid LM, occluded mid LAD, 70% proximal LAD, occluded distal RCA and occluded ostial left circumflex with patent LIMA to the LAD and SVG to OM 2.  Underwent TAVR 10/13/2021 with 23 Edwards valve 2D echo 09/2022 showed EF of 56 5% with mild RV dysfunction and mild RVE, stable TAVR with mean gradient of 13 mmHg and mild PVL.  Was seen for follow-up and is doing well.  Denies chest pain or pressure, SOB, DOE, PND, orthopnea, lower extremity edema, dizziness, palpitations, or syncope.  Compliant with medications.  Today, he  presents with a hx of TAVR valve placement and COPD for cardiovascular follow-up.  He has had no new cardiac symptoms. He denies chest pain, shortness of breath, or TAVR-related symptoms since his last visit. His TAVR was placed in 2023, and an echocardiogram in 2024 showed a mild perivalvular leak.  He has COPD treated with Wixela inhaler and Singulair as needed, with no recent dyspnea and ongoing pulmonology care.  He notes elevated blood pressure readings in the office  that he attributes to white coat effect. His home blood pressure readings are within a reasonable range  Reports no shortness of breath nor dyspnea on exertion. Reports no chest pain, pressure, or tightness. No edema, orthopnea, PND. Reports no palpitations.   Discussed the use of AI scribe software for clinical note transcription with the patient, who gave verbal consent to proceed.   Studies Reviewed EKG Interpretation Date/Time:  Monday June 04 2024 15:34:04 EST Ventricular Rate:  71 PR Interval:  144 QRS Duration:  88 QT Interval:  400 QTC Calculation: 434 R Axis:   88  Text Interpretation: Normal sinus rhythm Normal ECG When compared with ECG of 26-May-2023 08:15, No significant change was found Confirmed by Melvin Dunn [phone]) on 06/04/2024 4:11:36 PM    Echo 10/06/22 IMPRESSIONS     1. Left ventricular ejection fraction, by estimation, is 60 to 65%. Left  ventricular ejection fraction by 3D volume is 61 %. The left ventricle has  normal function. The left ventricle has no regional wall motion  abnormalities. Left ventricular diastolic   parameters were normal.   2. Right ventricular systolic function is mildly reduced. The right  ventricular size is mildly enlarged. There is normal pulmonary artery  systolic pressure.   3. The mitral valve is normal in structure. Trivial mitral valve  regurgitation. No evidence of mitral stenosis.   4. The aortic valve has been repaired/replaced. Aortic valve  regurgitation is mild. There  is a 23 mm Edwards Sapien prosthetic (TAVR)  valve present in the aortic position. Procedure Date: 10/13/2021. Aortic  valve mean gradient measures 13.0 mmHg, stable  from prior echo. Mild PVL. Appears 2 AI jets of mild severity, one  paravalvular and one transvalvular   5. The inferior vena cava is normal in size with greater than 50%  respiratory variability, suggesting right atrial pressure of 3 mmHg.   Physical Exam VS:  BP (!) 154/80   Pulse  71   Wt 150 lb 6.4 oz (68.2 kg)   SpO2 97%   BMI 23.56 kg/m        Wt Readings from Last 3 Encounters:  06/04/24 150 lb 6.4 oz (68.2 kg)  05/26/23 156 lb 12.8 oz (71.1 kg)  04/19/23 158 lb (71.7 kg)    GEN: Well nourished, well developed in no acute distress NECK: No JVD; No carotid bruits CARDIAC: RRR, no murmurs, rubs, gallops RESPIRATORY:  Clear to auscultation without rales, wheezing or rhonchi  ABDOMEN: Soft, non-tender, non-distended EXTREMITIES:  No edema; No deformity   ASSESSMENT AND PLAN  Nonrheumatic aortic valve stenosis status post transcatheter aortic valve replacement Status post TAVR in 2023 with mild perivalvular leak on last echocardiogram in 2024. Valve functioning well. - No echocardiogram needed this year. - Plan for echocardiogram next year to monitor valve stability.  Pure hypercholesterolemia LDL cholesterol well-controlled at 47 mg/dL with Repatha . Triglycerides at 149 mg/dL. - Continue Repatha  for cholesterol management.  Essential hypertension Blood pressure elevated in office, likely due to white coat syndrome. Home blood pressure readings are within reasonable range. - Monitor blood pressure at home. - Contact provider if home blood pressure readings increase.  Coronary artery disease -He is status post CABG x 4 in 2015 - No chest pain or shortness of breath - Recommended continuing current medication regimen of aspirin  81 mg daily, Lipitor  80 mg daily, Repatha  140 mg twice a month, Lasix  20 mg daily, Vascepa  2 g twice a day, Lopressor  50 mg twice daily  Chronic diastolic heart failure -Euvolemic on exam today -Recommend continuing current medications     Dispo: He can follow-up in 1 year with MD  Signed, Orren LOISE Fabry, PA-C   "

## 2024-06-04 ENCOUNTER — Ambulatory Visit: Attending: Physician Assistant | Admitting: Physician Assistant

## 2024-06-04 ENCOUNTER — Encounter: Payer: Self-pay | Admitting: Physician Assistant

## 2024-06-04 VITALS — BP 154/80 | HR 71 | Wt 150.4 lb

## 2024-06-04 DIAGNOSIS — I350 Nonrheumatic aortic (valve) stenosis: Secondary | ICD-10-CM | POA: Diagnosis not present

## 2024-06-04 DIAGNOSIS — E7800 Pure hypercholesterolemia, unspecified: Secondary | ICD-10-CM | POA: Diagnosis not present

## 2024-06-04 DIAGNOSIS — Z952 Presence of prosthetic heart valve: Secondary | ICD-10-CM

## 2024-06-04 DIAGNOSIS — I5032 Chronic diastolic (congestive) heart failure: Secondary | ICD-10-CM | POA: Diagnosis not present

## 2024-06-04 DIAGNOSIS — I10 Essential (primary) hypertension: Secondary | ICD-10-CM | POA: Diagnosis not present

## 2024-06-04 DIAGNOSIS — E785 Hyperlipidemia, unspecified: Secondary | ICD-10-CM | POA: Diagnosis not present

## 2024-06-04 DIAGNOSIS — Z79899 Other long term (current) drug therapy: Secondary | ICD-10-CM | POA: Diagnosis not present

## 2024-06-04 DIAGNOSIS — I2510 Atherosclerotic heart disease of native coronary artery without angina pectoris: Secondary | ICD-10-CM

## 2024-06-04 MED ORDER — VASCEPA 1 G PO CAPS: 2.0000 g | ORAL_CAPSULE | Freq: Two times a day (BID) | ORAL | 2 refills | Status: AC

## 2024-06-04 MED ORDER — FUROSEMIDE 20 MG PO TABS: 20.0000 mg | ORAL_TABLET | Freq: Every day | ORAL | 2 refills | Status: AC

## 2024-06-04 NOTE — Patient Instructions (Signed)
 Medication Instructions:  None  *If you need a refill on your cardiac medications before your next appointment, please call your pharmacy*  Lab Work: None  If you have labs (blood work) drawn today and your tests are completely normal, you will receive your results only by: MyChart Message (if you have MyChart) OR A paper copy in the mail If you have any lab test that is abnormal or we need to change your treatment, we will call you to review the results.  Testing/Procedures: None   Follow-Up: At [facility], you and your health needs are our priority.  As part of our continuing mission to provide you with exceptional heart care, our providers are all part of one team.  This team includes your primary Cardiologist (physician) and Advanced Practice Providers or APPs (Physician Assistants and Nurse Practitioners) who all work together to provide you with the care you need, when you need it.  Your next appointment:   1 year(s)  Provider:   Wilbert Bihari, MD     Other Instructions Check blood pressure daily for 2 weeks.  Please notify the office if your blood pressure is consistanly greater than 150/90.  Thank you.

## 2024-06-05 ENCOUNTER — Telehealth: Payer: Self-pay | Admitting: Pharmacist

## 2024-06-05 NOTE — Telephone Encounter (Signed)
-----   Message from [facility] W sent at 06/04/2024  3:42 PM EST ----- Regarding: Repatha  Hello.  The patient stated that he needed a prior authorization done for his Repatha  because he has a different insurance plan this year.

## 2024-06-05 NOTE — Telephone Encounter (Signed)
 Please do PA for Repatha . Continuation of therapy on new insurance.

## 2024-06-06 ENCOUNTER — Telehealth: Payer: Self-pay | Admitting: Pharmacy Technician

## 2024-06-06 ENCOUNTER — Other Ambulatory Visit (HOSPITAL_COMMUNITY): Payer: Self-pay

## 2024-06-06 NOTE — Telephone Encounter (Signed)
 Pharmacy Patient Advocate Encounter   Received notification from Physician's Office that prior authorization for Repatha  is required/requested.   Insurance verification completed.   The patient is insured through [employer].   Per test claim: Refill too soon. PA is not needed at this time. Medication was filled 06/03/24. Next eligible fill date is 08/05/24.

## 2024-06-07 ENCOUNTER — Other Ambulatory Visit: Payer: Self-pay | Admitting: Family Medicine

## 2024-06-07 ENCOUNTER — Ambulatory Visit
Admission: RE | Admit: 2024-06-07 | Discharge: 2024-06-07 | Disposition: A | Discharge status: Home / Self Care | Source: Ambulatory Visit | Attending: Family Medicine | Admitting: Family Medicine

## 2024-06-07 ENCOUNTER — Encounter: Payer: Self-pay | Admitting: Family Medicine

## 2024-06-07 ENCOUNTER — Other Ambulatory Visit (HOSPITAL_COMMUNITY): Payer: Self-pay

## 2024-06-07 DIAGNOSIS — R1030 Lower abdominal pain, unspecified: Secondary | ICD-10-CM

## 2024-06-07 MED ORDER — IOPAMIDOL (ISOVUE-370) INJECTION 76%
80.0000 mL | Freq: Once | INTRAVENOUS | Status: AC | PRN
  Administered 2024-06-07: 80 mL via INTRAVENOUS

## 2024-06-07 NOTE — Telephone Encounter (Signed)
 It probably was a courtesy fill. Can we put it on the radar to do, no rush, but maybe in the next week

## 2024-06-07 NOTE — Telephone Encounter (Signed)
 This medication or product was previously approved on [id number] from 2024-03-13 to 2025-05-16.

## 2024-06-07 NOTE — Telephone Encounter (Signed)
 Oh ok. Thank you for checking.

## 2024-06-08 ENCOUNTER — Other Ambulatory Visit (HOSPITAL_COMMUNITY): Payer: Self-pay

## 2024-06-08 ENCOUNTER — Telehealth: Payer: Self-pay | Admitting: Pharmacy Technician

## 2024-06-08 ENCOUNTER — Telehealth: Payer: Self-pay | Admitting: Cardiology

## 2024-06-08 NOTE — Telephone Encounter (Signed)
 Closing encounter. Addressed via pharmacy

## 2024-06-08 NOTE — Telephone Encounter (Signed)
Patient called to follow up - please advise

## 2024-06-08 NOTE — Telephone Encounter (Signed)
 Patient had active grant on file- also added it to [facility]  05/13/24- 05/12/25  BIN: [id number] PCN: [id number] GROUP: [id number] ID: [id number]  Covers repatha  and Vascepa 

## 2024-06-08 NOTE — Telephone Encounter (Signed)
 Pt c/o medication issue:  1. Name of Medication:   VASCEPA  1 g capsule    2. How are you currently taking this medication (dosage and times per day)? As written   3. Are you having a reaction (difficulty breathing--STAT)? No   4. What is your medication issue? Pt stated he needed assistance with completing grant renewal for this medication please advise

## 2024-06-11 NOTE — Telephone Encounter (Signed)
 Called [facility] pharmacy and spoke with pharmacist. Lorrene information needed for patient cost, reason for rejection.   BIN: [id number] PCN: [id number] GROUP: [id number] ID: [id number]  This information given to pharmacy and transaction complete and ready for pick-up. Spoke with patient and will pick up as soon as he can get out, weather permitting.   Pt verbalizes understanding of plan.

## 2024-06-11 NOTE — Telephone Encounter (Signed)
 Pt called in and stated he went to he pharmacy to pick up meds and it gave him a rejection message.    Best number

## 2024-06-12 ENCOUNTER — Other Ambulatory Visit: Payer: Self-pay | Admitting: Urology

## 2024-06-12 ENCOUNTER — Other Ambulatory Visit: Payer: Self-pay | Admitting: Cardiology

## 2024-06-12 DIAGNOSIS — N2889 Other specified disorders of kidney and ureter: Secondary | ICD-10-CM

## 2024-06-13 ENCOUNTER — Ambulatory Visit: Admitting: Pulmonary Disease

## 2024-06-13 ENCOUNTER — Encounter: Payer: Self-pay | Admitting: Pulmonary Disease

## 2024-06-13 DIAGNOSIS — J449 Chronic obstructive pulmonary disease, unspecified: Secondary | ICD-10-CM

## 2024-06-13 MED ORDER — FLUTICASONE-SALMETEROL 100-50 MCG/ACT IN AEPB: 1.0000 | INHALATION_SPRAY | Freq: Two times a day (BID) | RESPIRATORY_TRACT | 11 refills | Status: AC

## 2024-06-13 MED ORDER — ALBUTEROL SULFATE HFA 108 (90 BASE) MCG/ACT IN AERS: 1.0000 | INHALATION_SPRAY | Freq: Four times a day (QID) | RESPIRATORY_TRACT | 5 refills | Status: AC | PRN

## 2024-06-13 NOTE — Progress Notes (Signed)
 "  Established Patient Pulmonology Office Visit   Subjective:  Patient ID: [name], male    DOB: 10/27/1950  MRN: [id number]  CC:  Chief Complaint  Patient presents with   Medical Management of Chronic Issues    Pt states no new concerns     Discussed the use of AI scribe software for clinical note transcription with the patient, who gave verbal consent to proceed.  History of Present Illness Melvin Dunn. is a 74 year old male with COPD who presents for a follow-up visit.  He reports no significant change in breathing since his last visit over a year ago. He has stable shortness of breath with exertion, such as when cleaning steps, but no increase in frequency or severity and no increase in dyspnea with walking. He uses Wixela 100-50mcg one puff twice daily and albuterol  as needed, mainly after exertion. He has not needed antibiotics or oral steroids since the last visit. He has chronic mucus production he attributes to sinus drainage. Cold air worsens his breathing, and he limits time outdoors in cold weather.        ROS   Current Medications[1]      Objective:  BP (!) 148/70   Pulse 72   Ht 5' 7.5 (1.715 m) Comment: per pt  Wt 148 lb 9.6 oz (67.4 kg)   SpO2 98%   BMI 22.93 kg/m     Physical Exam Constitutional:      General: He is not in acute distress.    Appearance: Normal appearance.  Eyes:     General: No scleral icterus.    Conjunctiva/sclera: Conjunctivae normal.  Cardiovascular:     Rate and Rhythm: Normal rate and regular rhythm.  Pulmonary:     Breath sounds: No wheezing, rhonchi or rales.  Musculoskeletal:     Right lower leg: No edema.     Left lower leg: No edema.  Skin:    General: Skin is warm and dry.  Neurological:     General: No focal deficit present.      Diagnostic Review:       Assessment & Plan:   Assessment & Plan Chronic obstructive pulmonary disease, unspecified COPD type (HCC)  Orders:   albuterol   (VENTOLIN  HFA) 108 (90 Base) MCG/ACT inhaler; Inhale 1-2 puffs into the lungs every 6 (six) hours as needed for wheezing or shortness of breath.   fluticasone -salmeterol (WIXELA INHUB) 100-50 MCG/ACT AEPB; Inhale 1 puff into the lungs 2 (two) times daily.   Assessment and Plan Assessment & Plan Chronic obstructive pulmonary disease Stable condition with effective management. No recent exacerbations or need for additional medications. - Continue Wixela inhaler, one puff twice daily. - Use albuterol  as needed for dyspnea. - Monitor albuterol  use frequency to assess Wixela dose adjustment. - Provided inhaler refills. - Follow up in one year unless symptoms change.      Return in about 1 year (around 06/13/2025) for f/u visit [name]   Dorn KATHEE Kara, MD     [1]  Current Outpatient Medications:    acetaminophen  (TYLENOL ) 325 MG tablet, Take 650 mg by mouth every 6 (six) hours as needed for moderate pain., Disp: , Rfl:    aspirin  81 MG chewable tablet, Chew 81 mg by mouth daily., Disp: , Rfl:    atorvastatin  (LIPITOR ) 80 MG tablet, TAKE 1 TABLET BY MOUTH EVERY DAY, Disp: 90 tablet, Rfl: 3   Evolocumab  (REPATHA  SURECLICK) 140 MG/ML SOAJ, Inject 140 mg into  the skin every 14 (fourteen) days. Please link to [employer] grant. Stop Nexlizet ., Disp: 6 mL, Rfl: 3   fenofibrate  (TRICOR ) 145 MG tablet, Take 1 tablet (145 mg total) by mouth daily., Disp: 90 tablet, Rfl: 0   furosemide  (LASIX ) 20 MG tablet, Take 1 tablet (20 mg total) by mouth daily., Disp: 90 tablet, Rfl: 2   metFORMIN  (GLUCOPHAGE ) 500 MG tablet, Take 1 tablet (500 mg total) by mouth in the morning., Disp: , Rfl:    metoprolol  tartrate (LOPRESSOR ) 50 MG tablet, Take 50 mg by mouth 2 (two) times daily., Disp: , Rfl:    montelukast (SINGULAIR) 10 MG tablet, Take 10 mg by mouth daily., Disp: , Rfl:    nitroGLYCERIN  (NITROSTAT ) 0.4 MG SL tablet, Place 1 tablet (0.4 mg total) under the tongue every 5 (five) minutes as needed for  chest pain., Disp: 25 tablet, Rfl: 3   VASCEPA  1 g capsule, Take 2 capsules (2 g total) by mouth 2 (two) times daily., Disp: 360 capsule, Rfl: 2   albuterol  (VENTOLIN  HFA) 108 (90 Base) MCG/ACT inhaler, Inhale 1-2 puffs into the lungs every 6 (six) hours as needed for wheezing or shortness of breath., Disp: 8 g, Rfl: 5   fluticasone -salmeterol (WIXELA INHUB) 100-50 MCG/ACT AEPB, Inhale 1 puff into the lungs 2 (two) times daily., Disp: 60 each, Rfl: 11 No current facility-administered medications for this visit.  Facility-Administered Medications Ordered in Other Visits:    sodium phosphate  (FLEET) 7-19 GM/118ML enema 1 enema, 1 enema, Rectal, Once, [name], MD  "

## 2024-06-13 NOTE — Patient Instructions (Addendum)
 Start wixella 100-50mcg 1 puff twice daily - rinse mouth out after each use  Use as needed albuterol  inhaler 1-2 puffs every 4-6 hours as needed  Follow up in 1 year, call sooner if needed.

## 2024-06-21 ENCOUNTER — Inpatient Hospital Stay: Admission: RE | Admit: 2024-06-21 | Discharge: 2024-06-21 | Attending: Urology | Admitting: Urology

## 2024-06-21 DIAGNOSIS — N2889 Other specified disorders of kidney and ureter: Secondary | ICD-10-CM

## 2024-06-21 MED ORDER — GADOPICLENOL 0.5 MMOL/ML IV SOLN
7.5000 mL | Freq: Once | INTRAVENOUS | Status: AC | PRN
  Administered 2024-06-21: 7.5 mL via INTRAVENOUS
